# Patient Record
Sex: Male | Born: 1935 | Race: White | Hispanic: No | Marital: Married | Smoking: Never smoker
Health system: Southern US, Community
[De-identification: ages and names within clinical notes are randomized; demographics above are authoritative.]

## PROBLEM LIST (undated history)

## (undated) DIAGNOSIS — Z923 Personal history of irradiation: Secondary | ICD-10-CM

## (undated) DIAGNOSIS — N183 Chronic kidney disease, stage 3 unspecified: Secondary | ICD-10-CM

## (undated) DIAGNOSIS — Z8619 Personal history of other infectious and parasitic diseases: Secondary | ICD-10-CM

## (undated) DIAGNOSIS — Z9221 Personal history of antineoplastic chemotherapy: Secondary | ICD-10-CM

## (undated) DIAGNOSIS — H9190 Unspecified hearing loss, unspecified ear: Secondary | ICD-10-CM

## (undated) DIAGNOSIS — Z8669 Personal history of other diseases of the nervous system and sense organs: Secondary | ICD-10-CM

## (undated) DIAGNOSIS — C662 Malignant neoplasm of left ureter: Secondary | ICD-10-CM

## (undated) DIAGNOSIS — C679 Malignant neoplasm of bladder, unspecified: Secondary | ICD-10-CM

## (undated) DIAGNOSIS — G4700 Insomnia, unspecified: Secondary | ICD-10-CM

## (undated) DIAGNOSIS — H409 Unspecified glaucoma: Secondary | ICD-10-CM

## (undated) DIAGNOSIS — Z860101 Personal history of adenomatous and serrated colon polyps: Secondary | ICD-10-CM

## (undated) DIAGNOSIS — C189 Malignant neoplasm of colon, unspecified: Secondary | ICD-10-CM

## (undated) DIAGNOSIS — Z85038 Personal history of other malignant neoplasm of large intestine: Secondary | ICD-10-CM

## (undated) DIAGNOSIS — Z86010 Personal history of colonic polyps: Secondary | ICD-10-CM

## (undated) DIAGNOSIS — C801 Malignant (primary) neoplasm, unspecified: Secondary | ICD-10-CM

## (undated) DIAGNOSIS — F32A Depression, unspecified: Secondary | ICD-10-CM

## (undated) DIAGNOSIS — H905 Unspecified sensorineural hearing loss: Secondary | ICD-10-CM

## (undated) DIAGNOSIS — R310 Gross hematuria: Secondary | ICD-10-CM

## (undated) DIAGNOSIS — N400 Enlarged prostate without lower urinary tract symptoms: Secondary | ICD-10-CM

## (undated) DIAGNOSIS — M51369 Other intervertebral disc degeneration, lumbar region without mention of lumbar back pain or lower extremity pain: Secondary | ICD-10-CM

## (undated) DIAGNOSIS — Q600 Renal agenesis, unilateral: Secondary | ICD-10-CM

## (undated) DIAGNOSIS — E785 Hyperlipidemia, unspecified: Secondary | ICD-10-CM

## (undated) DIAGNOSIS — F329 Major depressive disorder, single episode, unspecified: Secondary | ICD-10-CM

## (undated) DIAGNOSIS — M5136 Other intervertebral disc degeneration, lumbar region: Secondary | ICD-10-CM

## (undated) DIAGNOSIS — IMO0002 Reserved for concepts with insufficient information to code with codable children: Secondary | ICD-10-CM

## (undated) DIAGNOSIS — C8330 Diffuse large B-cell lymphoma, unspecified site: Secondary | ICD-10-CM

## (undated) HISTORY — PX: COLONOSCOPY: SHX174

## (undated) HISTORY — PX: SIGMOIDOSCOPY: SUR1295

## (undated) HISTORY — PX: APPENDECTOMY: SHX54

---
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
## 1991-02-26 HISTORY — PX: HEMICOLECTOMY: SHX854

## 1998-11-15 ENCOUNTER — Ambulatory Visit (HOSPITAL_COMMUNITY): Admission: RE | Admit: 1998-11-15 | Discharge: 1998-11-15 | Payer: Self-pay | Admitting: Gastroenterology

## 2001-02-25 HISTORY — PX: INGUINAL HERNIA REPAIR: SUR1180

## 2001-11-17 ENCOUNTER — Ambulatory Visit (HOSPITAL_COMMUNITY): Admission: RE | Admit: 2001-11-17 | Discharge: 2001-11-17 | Payer: Self-pay | Admitting: Gastroenterology

## 2005-04-25 ENCOUNTER — Encounter: Admission: RE | Admit: 2005-04-25 | Discharge: 2005-04-25 | Payer: Self-pay | Admitting: Orthopedic Surgery

## 2013-01-01 ENCOUNTER — Emergency Department (HOSPITAL_COMMUNITY)
Admission: EM | Admit: 2013-01-01 | Discharge: 2013-01-01 | Disposition: A | Discharge status: Home / Self Care | Payer: Medicare Other | Source: Home / Self Care | Attending: Family Medicine | Admitting: Family Medicine

## 2013-01-01 ENCOUNTER — Encounter (HOSPITAL_COMMUNITY): Payer: Self-pay | Admitting: Emergency Medicine

## 2013-01-01 DIAGNOSIS — B029 Zoster without complications: Secondary | ICD-10-CM

## 2013-01-01 HISTORY — DX: Hyperlipidemia, unspecified: E78.5

## 2013-01-01 HISTORY — DX: Benign prostatic hyperplasia without lower urinary tract symptoms: N40.0

## 2013-01-01 HISTORY — DX: Insomnia, unspecified: G47.00

## 2013-01-01 MED ORDER — TRAMADOL HCL 50 MG PO TABS: 50.0000 mg | ORAL_TABLET | Freq: Four times a day (QID) | ORAL | Status: DC | PRN

## 2013-01-01 MED ORDER — VALACYCLOVIR HCL 1 G PO TABS: 1000.0000 mg | ORAL_TABLET | Freq: Three times a day (TID) | ORAL | Status: AC

## 2013-01-01 NOTE — ED Provider Notes (Signed)
Jonathan Marshall is a 79 y.o. male who presents to Urgent Care today for right sided rash. 3 days ago patient noted pain in his right lower back after playing golf. 2 days after his pain started he noted a rash on the right low back. His wife who is a nurse suspect shingles. The rash is been present for one day. He denies any other rashes fevers chills nausea vomiting or diarrhea. He denies any radiating pain weakness or numbness. He has not tried any medications yet.    Past Medical History  Diagnosis Date  . Insomnia   . BPH (benign prostatic hyperplasia)   . Hyperlipidemia    History  Substance Use Topics  . Smoking status: Never Smoker   . Smokeless tobacco: Not on file  . Alcohol Use: No   ROS as above Medications reviewed. No current facility-administered medications for this encounter.   Current Outpatient Prescriptions  Medication Sig Dispense Refill  . ALPRAZolam (XANAX) 0.5 MG tablet Take 0.5 mg by mouth at bedtime as needed for anxiety.      [name] Kitchen LOVASTATIN PO Take by mouth.      . tamsulosin (FLOMAX) 0.4 MG CAPS capsule Take 0.4 mg by mouth.      . traMADol (ULTRAM) 50 MG tablet Take 1 tablet (50 mg total) by mouth every 6 (six) hours as needed.  40 tablet  0  . valACYclovir (VALTREX) 1000 MG tablet Take 1 tablet (1,000 mg total) by mouth 3 (three) times daily.  21 tablet  0    Exam:  BP 146/93  Pulse 70  Temp(Src) 98.2 F (36.8 C) (Oral)  Resp 16  SpO2 94% Gen: Well NAD HEENT: EOMI,  MMM Lungs: CTABL Nl WOB Heart: RRR no MRG Abd: NABS, NT, ND Exts: Non edematous BL  LE, warm and well perfused.  Skin: Macular papular rash with occasional vesicles in the right low back. Extends to the mid flank. It does not cross the midline.  No results found for this or any previous visit (from the past 24 hour(s)). No results found.  Assessment and Plan: 79 y.o. male with shingles.  Plan to treat with Valtrex, and tramadol. Will avoid amitriptyline due to patient's BPH  history. Additionally we'll avoid prednisone as this has not been very beneficial and studies and patient is geriatric. Followup with primary care provider.  Discussed warning signs or symptoms. Please see discharge instructions. Patient expresses understanding.      Rodolph Bong, MD 01/01/13 1229

## 2013-01-01 NOTE — ED Notes (Signed)
Pt c/o rash, poss shingles, onset yest on lower back Feels like a "burning sensation" and it's painful Denies: f/v/n/d Alert w/no signs of acute distress.

## 2013-02-25 HISTORY — PX: MASTOIDECTOMY: SUR855

## 2013-02-27 ENCOUNTER — Emergency Department (HOSPITAL_COMMUNITY)
Admission: EM | Admit: 2013-02-27 | Discharge: 2013-02-27 | Disposition: A | Discharge status: Home / Self Care | Payer: Medicare Other | Source: Home / Self Care

## 2013-02-27 ENCOUNTER — Encounter (HOSPITAL_COMMUNITY): Payer: Self-pay | Admitting: Emergency Medicine

## 2013-02-27 DIAGNOSIS — J069 Acute upper respiratory infection, unspecified: Secondary | ICD-10-CM

## 2013-02-27 DIAGNOSIS — R059 Cough, unspecified: Secondary | ICD-10-CM

## 2013-02-27 DIAGNOSIS — R0982 Postnasal drip: Secondary | ICD-10-CM

## 2013-02-27 DIAGNOSIS — R05 Cough: Secondary | ICD-10-CM

## 2013-02-27 MED ORDER — GUAIFENESIN-CODEINE 100-10 MG/5ML PO SYRP: 5.0000 mL | ORAL_SOLUTION | ORAL | Status: DC | PRN

## 2013-02-27 MED ORDER — CHLORPHENIRAMINE MALEATE 4 MG PO TABS
ORAL_TABLET | ORAL | Status: DC
Start: 2013-02-27 — End: 2016-08-22

## 2013-02-27 NOTE — Discharge Instructions (Signed)
Cough, Adult  A cough is a reflex that helps clear your throat and airways. It can help heal the body or may be a reaction to an irritated airway. A cough may only last 2 or 3 weeks (acute) or may last more than 8 weeks (chronic).  CAUSES Acute cough:  Viral or bacterial infections. Chronic cough:  Infections.  Allergies.  Asthma.  Post-nasal drip.  Smoking.  Heartburn or acid reflux.  Some medicines.  Chronic lung problems (COPD).  Cancer. SYMPTOMS   Cough.  Fever.  Chest pain.  Increased breathing rate.  High-pitched whistling sound when breathing (wheezing).  Colored mucus that you cough up (sputum). TREATMENT   A bacterial cough may be treated with antibiotic medicine.  A viral cough must run its course and will not respond to antibiotics.  Your caregiver may recommend other treatments if you have a chronic cough. HOME CARE INSTRUCTIONS   Only take over-the-counter or prescription medicines for pain, discomfort, or fever as directed by your caregiver. Use cough suppressants only as directed by your caregiver.  Use a cold steam vaporizer or humidifier in your bedroom or home to help loosen secretions.  Sleep in a semi-upright position if your cough is worse at night.  Rest as needed.  Stop smoking if you smoke. SEEK IMMEDIATE MEDICAL CARE IF:   You have pus in your sputum.  Your cough starts to worsen.  You cannot control your cough with suppressants and are losing sleep.  You begin coughing up blood.  You have difficulty breathing.  You develop pain which is getting worse or is uncontrolled with medicine.  You have a fever. MAKE SURE YOU:   Understand these instructions.  Will watch your condition.  Will get help right away if you are not doing well or get worse. Document Released: 08/10/2010 Document Revised: 05/06/2011 Document Reviewed: 08/10/2010 [facility] Patient Information 2014 [place].  Upper Respiratory Infection,  Adult An upper respiratory infection (URI) is also known as the common cold. It is often caused by a type of germ (virus). Colds are easily spread (contagious). You can pass it to others by kissing, coughing, sneezing, or drinking out of the same glass. Usually, you get better in 1 or 2 weeks.  HOME CARE   Only take medicine as told by your doctor.  Use a warm mist humidifier or breathe in steam from a hot shower.  Drink enough water and fluids to keep your pee (urine) clear or pale yellow.  Get plenty of rest.  Return to work when your temperature is back to normal or as told by your doctor. You may use a face mask and wash your hands to stop your cold from spreading. GET HELP RIGHT AWAY IF:   After the first few days, you feel you are getting worse.  You have questions about your medicine.  You have chills, shortness of breath, or brown or red spit (mucus).  You have yellow or brown snot (nasal discharge) or pain in the face, especially when you bend forward.  You have a fever, puffy (swollen) neck, pain when you swallow, or white spots in the back of your throat.  You have a bad headache, ear pain, sinus pain, or chest pain.  You have a high-pitched whistling sound when you breathe in and out (wheezing).  You have a lasting cough or cough up blood.  You have sore muscles or a stiff neck. MAKE SURE YOU:   Understand these instructions.  Will watch your  condition.  Will get help right away if you are not doing well or get worse. Document Released: 07/31/2007 Document Revised: 05/06/2011 Document Reviewed: 06/18/2010 [facility] Patient Information 2014 [place], [place].

## 2013-02-27 NOTE — ED Notes (Signed)
C/o cold sx for 8 days States he has a dry cough Not able to sleep b/c of the dry cough Admits to runny nose Denies fever, vomiting, diarrhea States 8 weeks ago did have shingles.

## 2013-02-27 NOTE — ED Provider Notes (Signed)
CSN: [id number]     Arrival date & time 02/27/13  4235 History   First MD Initiated Contact with Patient 02/27/13 [phone]     Chief Complaint  Patient presents with  . Cough  . URI   (Consider location/radiation/quality/duration/timing/severity/associated sxs/prior Treatment) HPI Comments: Patient having PND and cough for 5 days.   Past Medical History  Diagnosis Date  . Insomnia   . BPH (benign prostatic hyperplasia)   . Hyperlipidemia    History reviewed. No pertinent past surgical history. History reviewed. No pertinent family history. History  Substance Use Topics  . Smoking status: Never Smoker   . Smokeless tobacco: Not on file  . Alcohol Use: No    Review of Systems  Constitutional: Negative for fever, diaphoresis, activity change and fatigue.  HENT: Positive for postnasal drip. Negative for ear pain, facial swelling, rhinorrhea, sore throat and trouble swallowing.   Eyes: Negative for pain, discharge and redness.  Respiratory: Positive for cough. Negative for chest tightness and shortness of breath.   Cardiovascular: Negative.   Gastrointestinal: Negative.   Musculoskeletal: Negative.  Negative for neck pain and neck stiffness.  Neurological: Negative.     Allergies  Review of patient's allergies indicates no known allergies.  Home Medications   Current Outpatient Rx  Name  Route  Sig  Dispense  Refill  . ALPRAZolam (XANAX) 0.5 MG tablet   Oral   Take 0.5 mg by mouth at bedtime as needed for anxiety.         . chlorpheniramine (CHLOR-TRIMETON) 4 MG tablet      1/2 tablet q 4-6h prn drainage   14 tablet   0   . guaiFENesin-codeine (CHERATUSSIN AC) 100-10 MG/5ML syrup   Oral   Take 5 mLs by mouth every 4 (four) hours as needed for cough or congestion.   120 mL   0   . LOVASTATIN PO   Oral   Take by mouth.         . tamsulosin (FLOMAX) 0.4 MG CAPS capsule   Oral   Take 0.4 mg by mouth.         . traMADol (ULTRAM) 50 MG tablet   Oral   Take  1 tablet (50 mg total) by mouth every 6 (six) hours as needed.   40 tablet   0    BP 144/83  Pulse 95  Temp(Src) 98.6 F (37 C) (Oral)  Resp 18  SpO2 97% Physical Exam  Nursing note and vitals reviewed. Constitutional: He is oriented to person, place, and time. He appears well-developed and well-nourished. No distress.  HENT:  Right Ear: External ear normal.  Left Ear: External ear normal.  Mouth/Throat: No oropharyngeal exudate.  OP with minor erythema and clear PND. No exudates.  Eyes: Conjunctivae and EOM are normal.  Neck: Normal range of motion. Neck supple.  Cardiovascular: Normal rate, regular rhythm and normal heart sounds.   Pulmonary/Chest: Effort normal and breath sounds normal. No respiratory distress. He has no wheezes. He has no rales.  Musculoskeletal: Normal range of motion. He exhibits no edema.  Lymphadenopathy:    He has no cervical adenopathy.  Neurological: He is alert and oriented to person, place, and time.  Skin: Skin is warm and dry. No rash noted.  Psychiatric: He has a normal mood and affect.    ED Course  Procedures (including critical care time) Labs Review Labs Reviewed - No data to display Imaging Review No results found.    MDM  1. Cough   2. PND (post-nasal drip)   3. URI (upper respiratory infection)    Chlorpheniramine 2 mg daily 4-6 hours when necessary drainage Cheratussin 1-2 teaspoons Q4H when necessary cough Plenty of fluids Follow with your PCP     Janne Napoleon, NP 02/27/13 [phone]

## 2013-03-02 NOTE — ED Provider Notes (Signed)
Medical screening examination/treatment/procedure(s) were performed by resident physician or non-physician practitioner and as supervising physician I was immediately available for consultation/collaboration.   Pauline Good MD.   Billy Fischer, MD 03/02/13 [phone]

## 2013-08-10 ENCOUNTER — Encounter (HOSPITAL_BASED_OUTPATIENT_CLINIC_OR_DEPARTMENT_OTHER): Payer: Self-pay | Admitting: *Deleted

## 2013-08-10 NOTE — Progress Notes (Signed)
No labs needed

## 2013-08-12 ENCOUNTER — Other Ambulatory Visit: Payer: Self-pay | Admitting: Physician Assistant

## 2013-08-12 NOTE — H&P (Signed)
Jonathan Marshall is an 80 y.o. male.   Chief Complaint: left carpal tunnel HPI: Jonathan Marshall is a long time patient of mine with left hand pain and numbness.  Treated by a chiropractor with sporadic improvement.  On the other hand, he has symptoms that seem more likely to be carpal tunnel rather than cervical.  The numbness which was mildly uncomfortable has now become significant a pain particularly in the index finger.  NCV study confirm severe carpal tunnel left hand.  Past Medical History  Diagnosis Date  . Insomnia   . BPH (benign prostatic hyperplasia)   . Hyperlipidemia   . History of shingles   . Cancer     colon    Past Surgical History  Procedure Laterality Date  . Colon resection  1993    colon cancer  . Colonoscopy      several  . Inguinal hernia repair  2003    right    No family history on file. Social History:  reports that he has never smoked. He does not have any smokeless tobacco history on file. He reports that he does not drink alcohol or use illicit drugs.  Allergies: No Known Allergies   (Not in a hospital admission)  No results found for this or any previous visit (from the past 48 hour(s)). No results found.  Review of Systems  Constitutional: Negative.   HENT: Negative.   Eyes: Negative.   Respiratory: Negative.   Cardiovascular: Negative.   Gastrointestinal: Negative.   Genitourinary: Negative.   Musculoskeletal: Negative.   Skin: Negative.   Neurological: Positive for tingling and sensory change. Negative for dizziness, tremors, speech change, focal weakness, seizures and loss of consciousness.  Endo/Heme/Allergies: Negative.   Psychiatric/Behavioral: Negative.     There were no vitals taken for this visit. Physical Exam  Constitutional: He is oriented to person, place, and time. He appears well-developed and well-nourished. No distress.  HENT:  Head: Normocephalic and atraumatic.  Nose: Nose normal.  Eyes: EOM are normal. Pupils are  equal, round, and reactive to light.  Neck: Normal range of motion. Neck supple.  Cardiovascular: Intact distal pulses.   Respiratory: Effort normal. No respiratory distress. He has no wheezes.  GI: Soft. He exhibits no distension. There is no tenderness.  Musculoskeletal:  Positive tinel and phalens left hand  Neurological: He is alert and oriented to person, place, and time. No cranial nerve deficit.  Skin: Skin is warm and dry. No rash noted. No erythema.  Psychiatric: He has a normal mood and affect. His behavior is normal.     Assessment/Plan Left carpal tunnel syndrome  Discussed risks and benefits of left carpal tunnel release patient wishes to proceed.  This will be done outpatient as soon as possible.  Chriss Czar 08/12/2013, 2:13 PM

## 2013-08-13 ENCOUNTER — Encounter (HOSPITAL_BASED_OUTPATIENT_CLINIC_OR_DEPARTMENT_OTHER)
Admission: RE | Disposition: A | Discharge status: Home / Self Care | Payer: Self-pay | Source: Ambulatory Visit | Attending: Orthopedic Surgery

## 2013-08-13 ENCOUNTER — Ambulatory Visit (HOSPITAL_BASED_OUTPATIENT_CLINIC_OR_DEPARTMENT_OTHER)
Admission: RE | Admit: 2013-08-13 | Discharge: 2013-08-13 | Disposition: A | Discharge status: Home / Self Care | Payer: Medicare Other | Source: Ambulatory Visit | Attending: Orthopedic Surgery | Admitting: Orthopedic Surgery

## 2013-08-13 ENCOUNTER — Encounter (HOSPITAL_BASED_OUTPATIENT_CLINIC_OR_DEPARTMENT_OTHER): Payer: Self-pay | Admitting: *Deleted

## 2013-08-13 ENCOUNTER — Ambulatory Visit (HOSPITAL_BASED_OUTPATIENT_CLINIC_OR_DEPARTMENT_OTHER): Payer: Medicare Other | Admitting: Certified Registered"

## 2013-08-13 ENCOUNTER — Encounter (HOSPITAL_BASED_OUTPATIENT_CLINIC_OR_DEPARTMENT_OTHER): Payer: Medicare Other | Admitting: Certified Registered"

## 2013-08-13 DIAGNOSIS — E785 Hyperlipidemia, unspecified: Secondary | ICD-10-CM | POA: Insufficient documentation

## 2013-08-13 DIAGNOSIS — Z85038 Personal history of other malignant neoplasm of large intestine: Secondary | ICD-10-CM | POA: Insufficient documentation

## 2013-08-13 DIAGNOSIS — N400 Enlarged prostate without lower urinary tract symptoms: Secondary | ICD-10-CM | POA: Insufficient documentation

## 2013-08-13 DIAGNOSIS — G5600 Carpal tunnel syndrome, unspecified upper limb: Secondary | ICD-10-CM | POA: Insufficient documentation

## 2013-08-13 HISTORY — PX: CARPAL TUNNEL RELEASE: SHX101

## 2013-08-13 HISTORY — DX: Personal history of other infectious and parasitic diseases: Z86.19

## 2013-08-13 HISTORY — DX: Malignant (primary) neoplasm, unspecified: C80.1

## 2013-08-13 LAB — POCT HEMOGLOBIN-HEMACUE: HEMOGLOBIN: 17.7 g/dL — AB (ref 13.0–17.0)

## 2013-08-13 SURGERY — CARPAL TUNNEL RELEASE
Anesthesia: General | Site: Wrist | Laterality: Left

## 2013-08-13 MED ORDER — FENTANYL CITRATE 0.05 MG/ML IJ SOLN: INTRAMUSCULAR | Status: DC | PRN

## 2013-08-13 MED ORDER — LIDOCAINE HCL (CARDIAC) 20 MG/ML IV SOLN
INTRAVENOUS | Status: DC | PRN
  Administered 2013-08-13: 100 mg via INTRAVENOUS

## 2013-08-13 MED ORDER — FENTANYL CITRATE 0.05 MG/ML IJ SOLN
INTRAMUSCULAR | Status: DC | PRN
  Administered 2013-08-13: 100 ug via INTRAVENOUS

## 2013-08-13 MED ORDER — CEFAZOLIN SODIUM-DEXTROSE 2-3 GM-% IV SOLR
INTRAVENOUS | Status: AC
  Filled 2013-08-13: qty 50

## 2013-08-13 MED ORDER — EPHEDRINE SULFATE 50 MG/ML IJ SOLN
INTRAMUSCULAR | Status: DC | PRN
  Administered 2013-08-13: 5 mg via INTRAVENOUS
  Administered 2013-08-13 (×2): 10 mg via INTRAVENOUS

## 2013-08-13 MED ORDER — SODIUM CHLORIDE 0.9 % IV SOLN: INTRAVENOUS | Status: DC

## 2013-08-13 MED ORDER — FENTANYL CITRATE 0.05 MG/ML IJ SOLN: 50.0000 ug | INTRAMUSCULAR | Status: DC | PRN

## 2013-08-13 MED ORDER — BUPIVACAINE HCL (PF) 0.5 % IJ SOLN
INTRAMUSCULAR | Status: AC
  Filled 2013-08-13: qty 30

## 2013-08-13 MED ORDER — CHLORHEXIDINE GLUCONATE 4 % EX LIQD: 60.0000 mL | Freq: Once | CUTANEOUS | Status: DC

## 2013-08-13 MED ORDER — FENTANYL CITRATE 0.05 MG/ML IJ SOLN
INTRAMUSCULAR | Status: AC
  Filled 2013-08-13: qty 6

## 2013-08-13 MED ORDER — LACTATED RINGERS IV SOLN
INTRAVENOUS | Status: DC
Start: 2013-08-13 — End: 2013-08-13
  Administered 2013-08-13: 10:00:00 via INTRAVENOUS

## 2013-08-13 MED ORDER — LIDOCAINE HCL (PF) 1 % IJ SOLN
INTRAMUSCULAR | Status: AC
  Filled 2013-08-13: qty 30

## 2013-08-13 MED ORDER — OXYCODONE HCL 5 MG PO TABS: 5.0000 mg | ORAL_TABLET | Freq: Once | ORAL | Status: DC | PRN

## 2013-08-13 MED ORDER — HYDROMORPHONE HCL PF 1 MG/ML IJ SOLN: 0.2500 mg | INTRAMUSCULAR | Status: DC | PRN

## 2013-08-13 MED ORDER — PROPOFOL 10 MG/ML IV BOLUS
INTRAVENOUS | Status: DC | PRN
  Administered 2013-08-13: 150 mg via INTRAVENOUS

## 2013-08-13 MED ORDER — GLYCOPYRROLATE 0.2 MG/ML IJ SOLN
INTRAMUSCULAR | Status: DC | PRN
  Administered 2013-08-13: 0.2 mg via INTRAVENOUS

## 2013-08-13 MED ORDER — CEFAZOLIN SODIUM-DEXTROSE 2-3 GM-% IV SOLR
2.0000 g | INTRAVENOUS | Status: AC
  Administered 2013-08-13: 2 g via INTRAVENOUS

## 2013-08-13 MED ORDER — MIDAZOLAM HCL 2 MG/2ML IJ SOLN: 1.0000 mg | INTRAMUSCULAR | Status: DC | PRN

## 2013-08-13 MED ORDER — ONDANSETRON HCL 4 MG/2ML IJ SOLN: 4.0000 mg | Freq: Once | INTRAMUSCULAR | Status: DC | PRN

## 2013-08-13 MED ORDER — OXYCODONE HCL 5 MG/5ML PO SOLN: 5.0000 mg | Freq: Once | ORAL | Status: DC | PRN

## 2013-08-13 MED ORDER — BUPIVACAINE HCL (PF) 0.5 % IJ SOLN
INTRAMUSCULAR | Status: DC | PRN
  Administered 2013-08-13: 10 mL

## 2013-08-13 SURGICAL SUPPLY — 42 items
BANDAGE ELASTIC 3 VELCRO ST LF (GAUZE/BANDAGES/DRESSINGS) ×2 IMPLANT
BLADE MINI RND TIP GREEN BEAV (BLADE) IMPLANT
BLADE SURG 15 STRL LF DISP TIS (BLADE) ×1 IMPLANT
BLADE SURG 15 STRL SS (BLADE) ×1
BNDG CONFORM 3 STRL LF (GAUZE/BANDAGES/DRESSINGS) ×2 IMPLANT
BNDG ESMARK 4X9 LF (GAUZE/BANDAGES/DRESSINGS) ×2 IMPLANT
CORDS BIPOLAR (ELECTRODE) ×2 IMPLANT
COVER MAYO STAND STRL (DRAPES) ×2 IMPLANT
COVER TABLE BACK 60X90 (DRAPES) ×2 IMPLANT
DRAPE EXTREMITY T 121X128X90 (DRAPE) ×2 IMPLANT
DRSG EMULSION OIL 3X3 NADH (GAUZE/BANDAGES/DRESSINGS) ×2 IMPLANT
DRSG PAD ABDOMINAL 8X10 ST (GAUZE/BANDAGES/DRESSINGS) ×2 IMPLANT
DURAPREP 26ML APPLICATOR (WOUND CARE) ×2 IMPLANT
GAUZE SPONGE 4X4 12PLY STRL (GAUZE/BANDAGES/DRESSINGS) ×2 IMPLANT
GLOVE BIO SURGEON STRL SZ 6.5 (GLOVE) ×6 IMPLANT
GLOVE BIO SURGEON STRL SZ7.5 (GLOVE) ×2 IMPLANT
GLOVE BIOGEL PI IND STRL 7.0 (GLOVE) ×3 IMPLANT
GLOVE BIOGEL PI IND STRL 8 (GLOVE) ×3 IMPLANT
GLOVE BIOGEL PI INDICATOR 7.0 (GLOVE) ×3
GLOVE BIOGEL PI INDICATOR 8 (GLOVE) ×3
GLOVE SURG ORTHO 8.0 STRL STRW (GLOVE) ×2 IMPLANT
GOWN STRL REUS W/ TWL LRG LVL3 (GOWN DISPOSABLE) ×3 IMPLANT
GOWN STRL REUS W/ TWL XL LVL3 (GOWN DISPOSABLE) ×1 IMPLANT
GOWN STRL REUS W/TWL LRG LVL3 (GOWN DISPOSABLE) ×3
GOWN STRL REUS W/TWL XL LVL3 (GOWN DISPOSABLE) ×1
NEEDLE HYPO 25X1 1.5 SAFETY (NEEDLE) ×2 IMPLANT
NS IRRIG 1000ML POUR BTL (IV SOLUTION) ×2 IMPLANT
PACK BASIN DAY SURGERY FS (CUSTOM PROCEDURE TRAY) ×2 IMPLANT
PADDING CAST ABS 3INX4YD NS (CAST SUPPLIES) ×1
PADDING CAST ABS 4INX4YD NS (CAST SUPPLIES)
PADDING CAST ABS COTTON 3X4 (CAST SUPPLIES) ×1 IMPLANT
PADDING CAST ABS COTTON 4X4 ST (CAST SUPPLIES) IMPLANT
SPLINT PLASTER CAST XFAST 3X15 (CAST SUPPLIES) ×1 IMPLANT
SPLINT PLASTER XTRA FASTSET 3X (CAST SUPPLIES) ×1
STOCKINETTE 4X48 STRL (DRAPES) ×2 IMPLANT
SUT ETHILON 4 0 PS 2 18 (SUTURE) ×2 IMPLANT
SUT ETHILON 5 0 P 3 18 (SUTURE)
SUT NYLON ETHILON 5-0 P-3 1X18 (SUTURE) IMPLANT
SYR BULB 3OZ (MISCELLANEOUS) ×2 IMPLANT
SYR CONTROL 10ML LL (SYRINGE) ×2 IMPLANT
TOWEL OR 17X24 6PK STRL BLUE (TOWEL DISPOSABLE) ×2 IMPLANT
UNDERPAD 30X30 INCONTINENT (UNDERPADS AND DIAPERS) ×2 IMPLANT

## 2013-08-13 NOTE — Anesthesia Preprocedure Evaluation (Signed)
Anesthesia Evaluation  Patient identified by MRN, date of birth, ID band Patient awake    Reviewed: Allergy & Precautions, H&P , NPO status , Patient's Chart, lab work & pertinent test results  Airway Mallampati: I TM Distance: >3 FB Neck ROM: Full    Dental  (+) Teeth Intact, Dental Advisory Given   Pulmonary  breath sounds clear to auscultation        Cardiovascular Rhythm:Regular Rate:Normal     Neuro/Psych    GI/Hepatic   Endo/Other    Renal/GU      Musculoskeletal   Abdominal   Peds  Hematology   Anesthesia Other Findings   Reproductive/Obstetrics                           Anesthesia Physical Anesthesia Plan  ASA: I  Anesthesia Plan: General   Post-op Pain Management:    Induction: Intravenous  Airway Management Planned: LMA  Additional Equipment:   Intra-op Plan:   Post-operative Plan: Extubation in OR  Informed Consent: I have reviewed the patients History and Physical, chart, labs and discussed the procedure including the risks, benefits and alternatives for the proposed anesthesia with the patient or authorized representative who has indicated his/her understanding and acceptance.   Dental advisory given  Plan Discussed with: CRNA, Anesthesiologist and Surgeon  Anesthesia Plan Comments:         Anesthesia Quick Evaluation

## 2013-08-13 NOTE — Interval H&P Note (Signed)
History and Physical Interval Note:  08/13/2013 7:28 AM  Jonathan Marshall  has presented today for surgery, with the diagnosis of left wrist: Carpal Tunnel Syndrome  The various methods of treatment have been discussed with the patient and family. After consideration of risks, benefits and other options for treatment, the patient has consented to  Procedure(s): CARPAL TUNNEL RELEASE LEFT WRIST (Left) as a surgical intervention .  The patient's history has been reviewed, patient examined, no change in status, stable for surgery.  I have reviewed the patient's chart and labs.  Questions were answered to the patient's satisfaction.     CAFFREY JR,W D

## 2013-08-13 NOTE — Discharge Instructions (Signed)
°  Post Anesthesia Home Care Instructions  Activity: Get plenty of rest for the remainder of the day. A responsible adult should stay with you for 24 hours following the procedure.  For the next 24 hours, DO NOT: -Drive a car -Paediatric nurse -Drink alcoholic beverages -Take any medication unless instructed by your physician -Make any legal decisions or sign important papers.  Meals: Start with liquid foods such as gelatin or soup. Progress to regular foods as tolerated. Avoid greasy, spicy, heavy foods. If nausea and/or vomiting occur, drink only clear liquids until the nausea and/or vomiting subsides. Call your physician if vomiting continues.  Special Instructions/Symptoms: Your throat may feel dry or sore from the anesthesia or the breathing tube placed in your throat during surgery. If this causes discomfort, gargle with warm salt water. The discomfort should disappear within 24 hours.  Diet: As you were doing prior to hospitalization   Activity: Increase activity slowly as tolerated.  Work on finger and hand range of motion as tolerated.    Shower: May shower without a dressing on post op day #3, NO SOAKING in tub   Dressing: You may change your dressing on post op day #3.  Then change the dressing daily with sterile 4"x4"s gauze dressing   Weight Bearing: no lifting with operative hand.    To prevent constipation: you may use a stool softener such as -  Colace ( over the counter) 100 mg by mouth twice a day  Drink plenty of fluids ( prune juice may be helpful) and high fiber foods  Miralax ( over the counter) for constipation as needed.   Precautions: If you experience chest pain or shortness of breath - call 911 immediately For transfer to the hospital emergency department!!  If you develop a fever greater that 101 F, purulent drainage from wound, increased redness or drainage from wound, or calf pain -- Call the office   Follow- Up Appointment: Please call for an  appointment to be seen in 2 weeks  [place] - [phone]          HAND [place]    The following instructions have been prepared to help you care for yourself upon your return home today.  Wound Care:  Keep your hand elevated above the level of your heart. Do not allow it to dangle by your side. Keep the dressing dry and do not remove it unless your doctor advises you to do so. He will usually change it at the time of you post-op visit. Moving your fingers is advised to stimulate circulation but will depend on the site of your surgery. Of course, if you have a splint applied your doctor will advise you about movement.  Activity:  Do not drive or operate machinery today. Rest today and then you may return to your normal activity and work as indicated by your physician.  Diet: Drink liquids today or eat a light diet. You may resume a regular diet tomorrow.  General expectations: Pain for two or three days. Fingers may become slightly swollen.   Unexpected Observations- Call your doctor if any of these occur: Severe pain not relieved by pain medication. Elevated temperature. Dressing soaked with blood. Inability to move fingers. White or bluish color to fingers.

## 2013-08-13 NOTE — Brief Op Note (Signed)
08/13/2013  11:34 AM  PATIENT:  Jonathan Marshall  80 y.o. male  PRE-OPERATIVE DIAGNOSIS:  left wrist: Carpal Tunnel Syndrome  POST-OPERATIVE DIAGNOSIS:  left wrist: Carpal Tunnel Syndrome  PROCEDURE:  Procedure(s): CARPAL TUNNEL RELEASE LEFT WRIST (Left)  SURGEON:  Surgeon(s) and Role:    * W D Valeta Harms., MD - Primary  PHYSICIAN ASSISTANT: Chriss Czar, PA-C  ASSISTANTS:  ANESTHESIA:   local and general  EBL:  Total I/O In: 300 [I.V.:300] Out: -   BLOOD ADMINISTERED:none  DRAINS: none   LOCAL MEDICATIONS USED:  MARCAINE     SPECIMEN:  No Specimen  DISPOSITION OF SPECIMEN:  N/A  COUNTS:  YES  TOURNIQUET:   Total Tourniquet Time Documented: Upper Arm (Left) - 22 minutes Total: Upper Arm (Left) - 22 minutes   DICTATION: .Other Dictation: Dictation Number unknown  PLAN OF CARE: Discharge to home after PACU  PATIENT DISPOSITION:  PACU - hemodynamically stable.   Delay start of Pharmacological VTE agent (>24hrs) due to surgical blood loss or risk of bleeding: not applicable

## 2013-08-13 NOTE — H&P (View-Only) (Signed)
Jonathan Marshall is an 80 y.o. male.   Chief Complaint: left carpal tunnel HPI: Adom Schoeneck is a long time patient of mine with left hand pain and numbness.  Treated by a chiropractor with sporadic improvement.  On the other hand, he has symptoms that seem more likely to be carpal tunnel rather than cervical.  The numbness which was mildly uncomfortable has now become significant a pain particularly in the index finger.  NCV study confirm severe carpal tunnel left hand.  Past Medical History  Diagnosis Date  . Insomnia   . BPH (benign prostatic hyperplasia)   . Hyperlipidemia   . History of shingles   . Cancer     colon    Past Surgical History  Procedure Laterality Date  . Colon resection  1993    colon cancer  . Colonoscopy      several  . Inguinal hernia repair  2003    right    No family history on file. Social History:  reports that he has never smoked. He does not have any smokeless tobacco history on file. He reports that he does not drink alcohol or use illicit drugs.  Allergies: No Known Allergies   (Not in a hospital admission)  No results found for this or any previous visit (from the past 48 hour(s)). No results found.  Review of Systems  Constitutional: Negative.   HENT: Negative.   Eyes: Negative.   Respiratory: Negative.   Cardiovascular: Negative.   Gastrointestinal: Negative.   Genitourinary: Negative.   Musculoskeletal: Negative.   Skin: Negative.   Neurological: Positive for tingling and sensory change. Negative for dizziness, tremors, speech change, focal weakness, seizures and loss of consciousness.  Endo/Heme/Allergies: Negative.   Psychiatric/Behavioral: Negative.     There were no vitals taken for this visit. Physical Exam  Constitutional: He is oriented to person, place, and time. He appears well-developed and well-nourished. No distress.  HENT:  Head: Normocephalic and atraumatic.  Nose: Nose normal.  Eyes: EOM are normal. Pupils are  equal, round, and reactive to light.  Neck: Normal range of motion. Neck supple.  Cardiovascular: Intact distal pulses.   Respiratory: Effort normal. No respiratory distress. He has no wheezes.  GI: Soft. He exhibits no distension. There is no tenderness.  Musculoskeletal:  Positive tinel and phalens left hand  Neurological: He is alert and oriented to person, place, and time. No cranial nerve deficit.  Skin: Skin is warm and dry. No rash noted. No erythema.  Psychiatric: He has a normal mood and affect. His behavior is normal.     Assessment/Plan Left carpal tunnel syndrome  Discussed risks and benefits of left carpal tunnel release patient wishes to proceed.  This will be done outpatient as soon as possible.  Chriss Czar 08/12/2013, 2:13 PM

## 2013-08-13 NOTE — Anesthesia Postprocedure Evaluation (Signed)
Anesthesia Post Note  Patient: Jonathan Marshall  Procedure(s) Performed: Procedure(s) (LRB): CARPAL TUNNEL RELEASE LEFT WRIST (Left)  Anesthesia type: general  Patient location: PACU  Post pain: Pain level controlled  Post assessment: Patient's Cardiovascular Status Stable  Last Vitals:  Filed Vitals:   08/13/13 1225  BP: 134/66  Pulse: 67  Temp: 36.4 C  Resp: 16    Post vital signs: Reviewed and stable  Level of consciousness: sedated  Complications: No apparent anesthesia complications

## 2013-08-13 NOTE — Anesthesia Procedure Notes (Signed)
Procedure Name: LMA Insertion Date/Time: 08/13/2013 10:49 AM Performed by: Baxter Flattery Pre-anesthesia Checklist: Patient identified, Emergency Drugs available, Suction available and Patient being monitored Patient Re-evaluated:Patient Re-evaluated prior to inductionOxygen Delivery Method: Circle System Utilized Preoxygenation: Pre-oxygenation with 100% oxygen Intubation Type: IV induction Ventilation: Mask ventilation without difficulty LMA: LMA inserted LMA Size: 5.0 Number of attempts: 1 Airway Equipment and Method: bite block Placement Confirmation: positive ETCO2 and breath sounds checked- equal and bilateral Tube secured with: Tape Dental Injury: Teeth and Oropharynx as per pre-operative assessment

## 2013-08-13 NOTE — Transfer of Care (Signed)
Immediate Anesthesia Transfer of Care Note  Patient: Jonathan Marshall  Procedure(s) Performed: Procedure(s): CARPAL TUNNEL RELEASE LEFT WRIST (Left)  Patient Location: PACU  Anesthesia Type:General  Level of Consciousness: awake, sedated and patient cooperative  Airway & Oxygen Therapy: Patient Spontanous Breathing and Patient connected to face mask oxygen  Post-op Assessment: Report given to PACU RN and Post -op Vital signs reviewed and stable  Post vital signs: Reviewed and stable  Complications: No apparent anesthesia complications

## 2013-08-16 ENCOUNTER — Encounter (HOSPITAL_BASED_OUTPATIENT_CLINIC_OR_DEPARTMENT_OTHER): Payer: Self-pay | Admitting: Orthopedic Surgery

## 2013-08-16 NOTE — Op Note (Signed)
NAME:  LENNARD, CAPEK NO.:  [id number]  MEDICAL RECORD NO.:  [id number]  LOCATION:                               FACILITY:  [facility]  PHYSICIAN:  Lockie Pares, M.D.    DATE OF BIRTH:  [birth year]  DATE OF PROCEDURE:  08/13/2013 DATE OF DISCHARGE:  08/13/2013                              OPERATIVE REPORT   PREOPERATIVE DIAGNOSIS:  Carpal tunnel, left wrist.  POSTOPERATIVE DIAGNOSIS:  Carpal tunnel, left wrist.  OPERATION:  Left carpal tunnel release.  SURGEON:  Lockie Pares, M.D.  ASSISTANT:  Chriss Czar, PA-C.  ANESTHESIA:  General.  BLOOD LOSS:  Minimal.  TOURNIQUET TIME:  Approximately 25 minutes.  DESCRIPTION OF PROCEDURE:  After sterile prep and drape, exsanguination of the arm, placed into 250, curvilinear incision was made just ulnar to the thenar crease.  The palmar fascia was very thick.  Very thick palmaris brevis was encountered.  We dissected on the ulnar side of the transverse carpal ligament.  We dissected distally to the level of superficial arch and proximally to include a very thickened antebrachial fascia of the wrist.  Unroofing the nerve with moderate constriction noted the nerve.  No other anomalies noted.  Wound irrigated, closed it with interrupted nylon with Marcaine without epinephrine.  Lightly compressive sterile dressing applied.  Taken to the recovery room in stable condition.     Lockie Pares, M.D.     WDC/MEDQ  D:  08/13/2013  T:  08/13/2013  Job:  [id number]

## 2013-10-08 ENCOUNTER — Other Ambulatory Visit: Payer: Self-pay | Admitting: Otolaryngology

## 2013-10-08 DIAGNOSIS — H908 Mixed conductive and sensorineural hearing loss, unspecified: Secondary | ICD-10-CM

## 2013-10-19 ENCOUNTER — Ambulatory Visit
Admission: RE | Admit: 2013-10-19 | Discharge: 2013-10-19 | Disposition: A | Discharge status: Home / Self Care | Payer: Medicare Other | Source: Ambulatory Visit | Attending: Otolaryngology | Admitting: Otolaryngology

## 2013-10-19 DIAGNOSIS — H908 Mixed conductive and sensorineural hearing loss, unspecified: Secondary | ICD-10-CM

## 2013-10-19 MED ORDER — IOHEXOL 300 MG/ML  SOLN
75.0000 mL | Freq: Once | INTRAMUSCULAR | Status: AC | PRN
  Administered 2013-10-19: 75 mL via INTRAVENOUS

## 2013-11-09 ENCOUNTER — Ambulatory Visit
Admission: RE | Admit: 2013-11-09 | Discharge: 2013-11-09 | Disposition: A | Discharge status: Home / Self Care | Payer: Medicare Other | Source: Ambulatory Visit | Attending: Internal Medicine | Admitting: Internal Medicine

## 2013-11-09 ENCOUNTER — Other Ambulatory Visit: Payer: Self-pay | Admitting: Internal Medicine

## 2013-11-09 DIAGNOSIS — R059 Cough, unspecified: Secondary | ICD-10-CM

## 2013-11-09 DIAGNOSIS — R05 Cough: Secondary | ICD-10-CM

## 2013-12-20 ENCOUNTER — Other Ambulatory Visit: Payer: Self-pay | Admitting: Otolaryngology

## 2016-08-14 IMAGING — CR DG CHEST 2V
2 series · 2 of 2 positions shown · non-contrast
Comparison: None.

CLINICAL DATA: Prolonged cough, congestion

EXAM:
CHEST  2 VIEW

[view not recorded (1 of 2)]
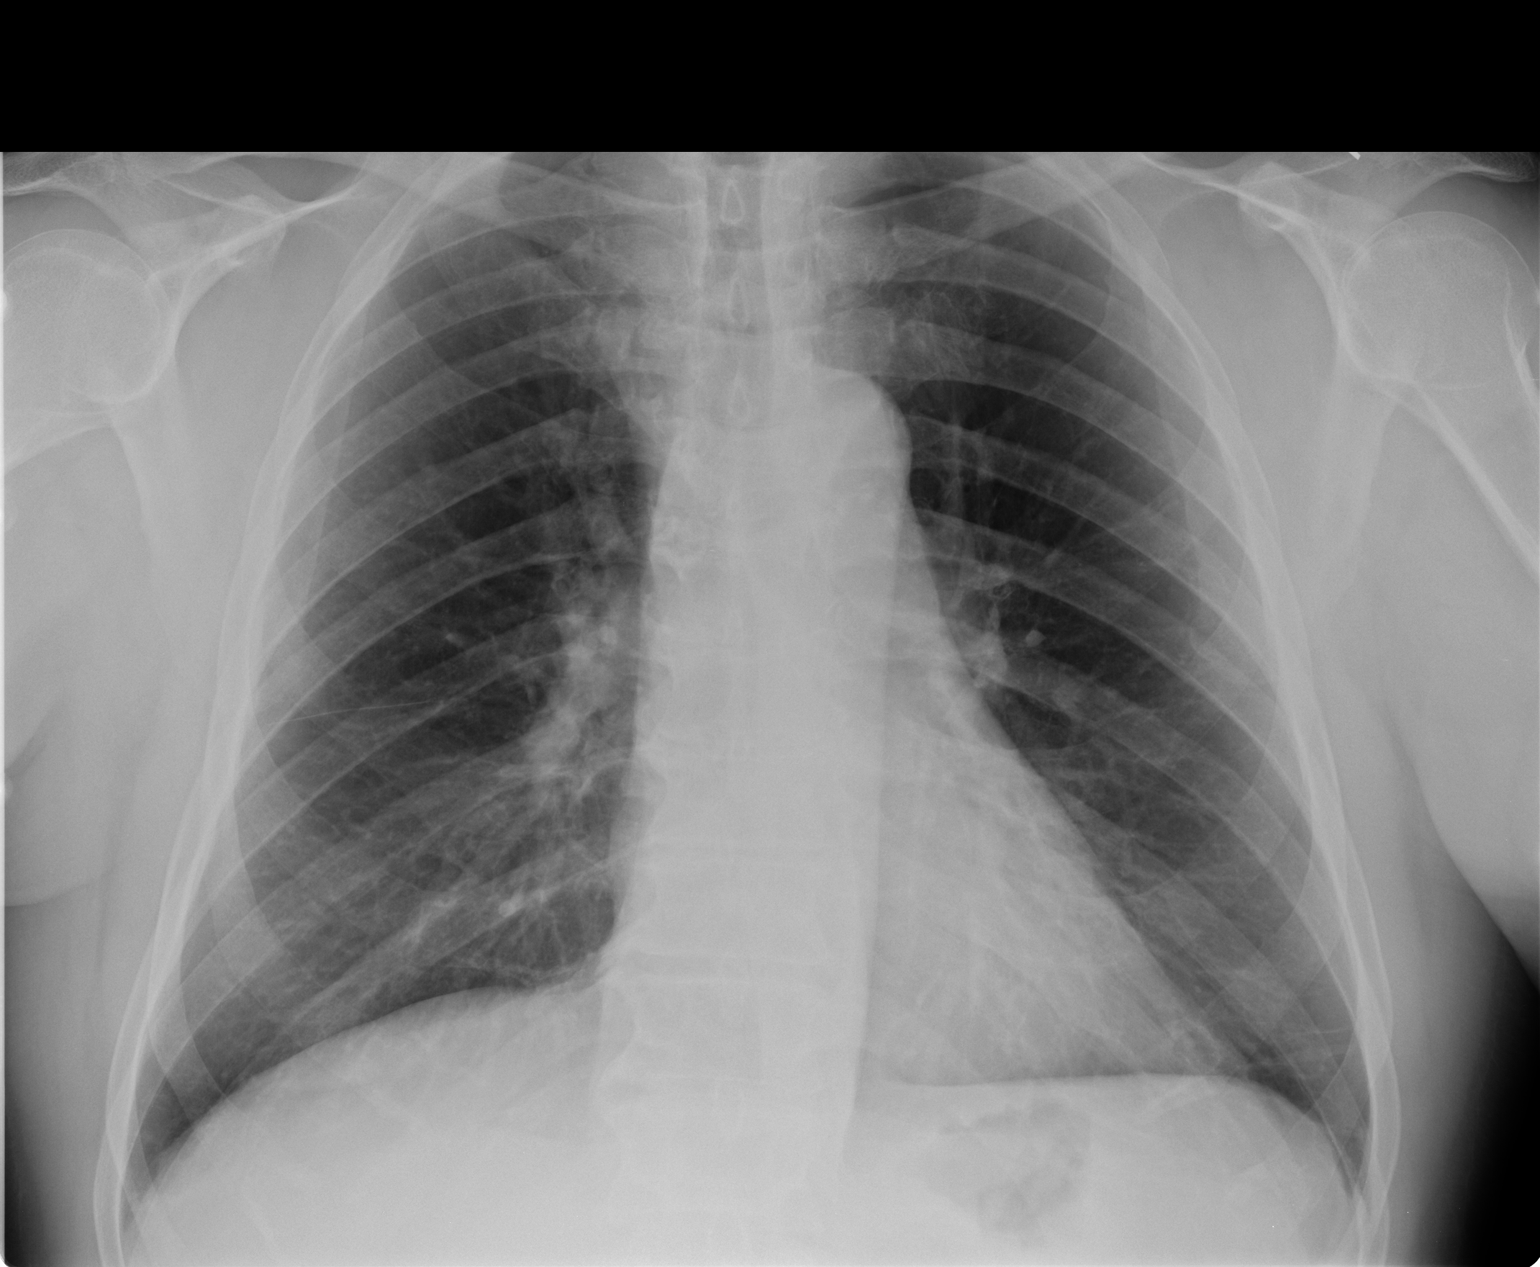

[view not recorded (2 of 2)]
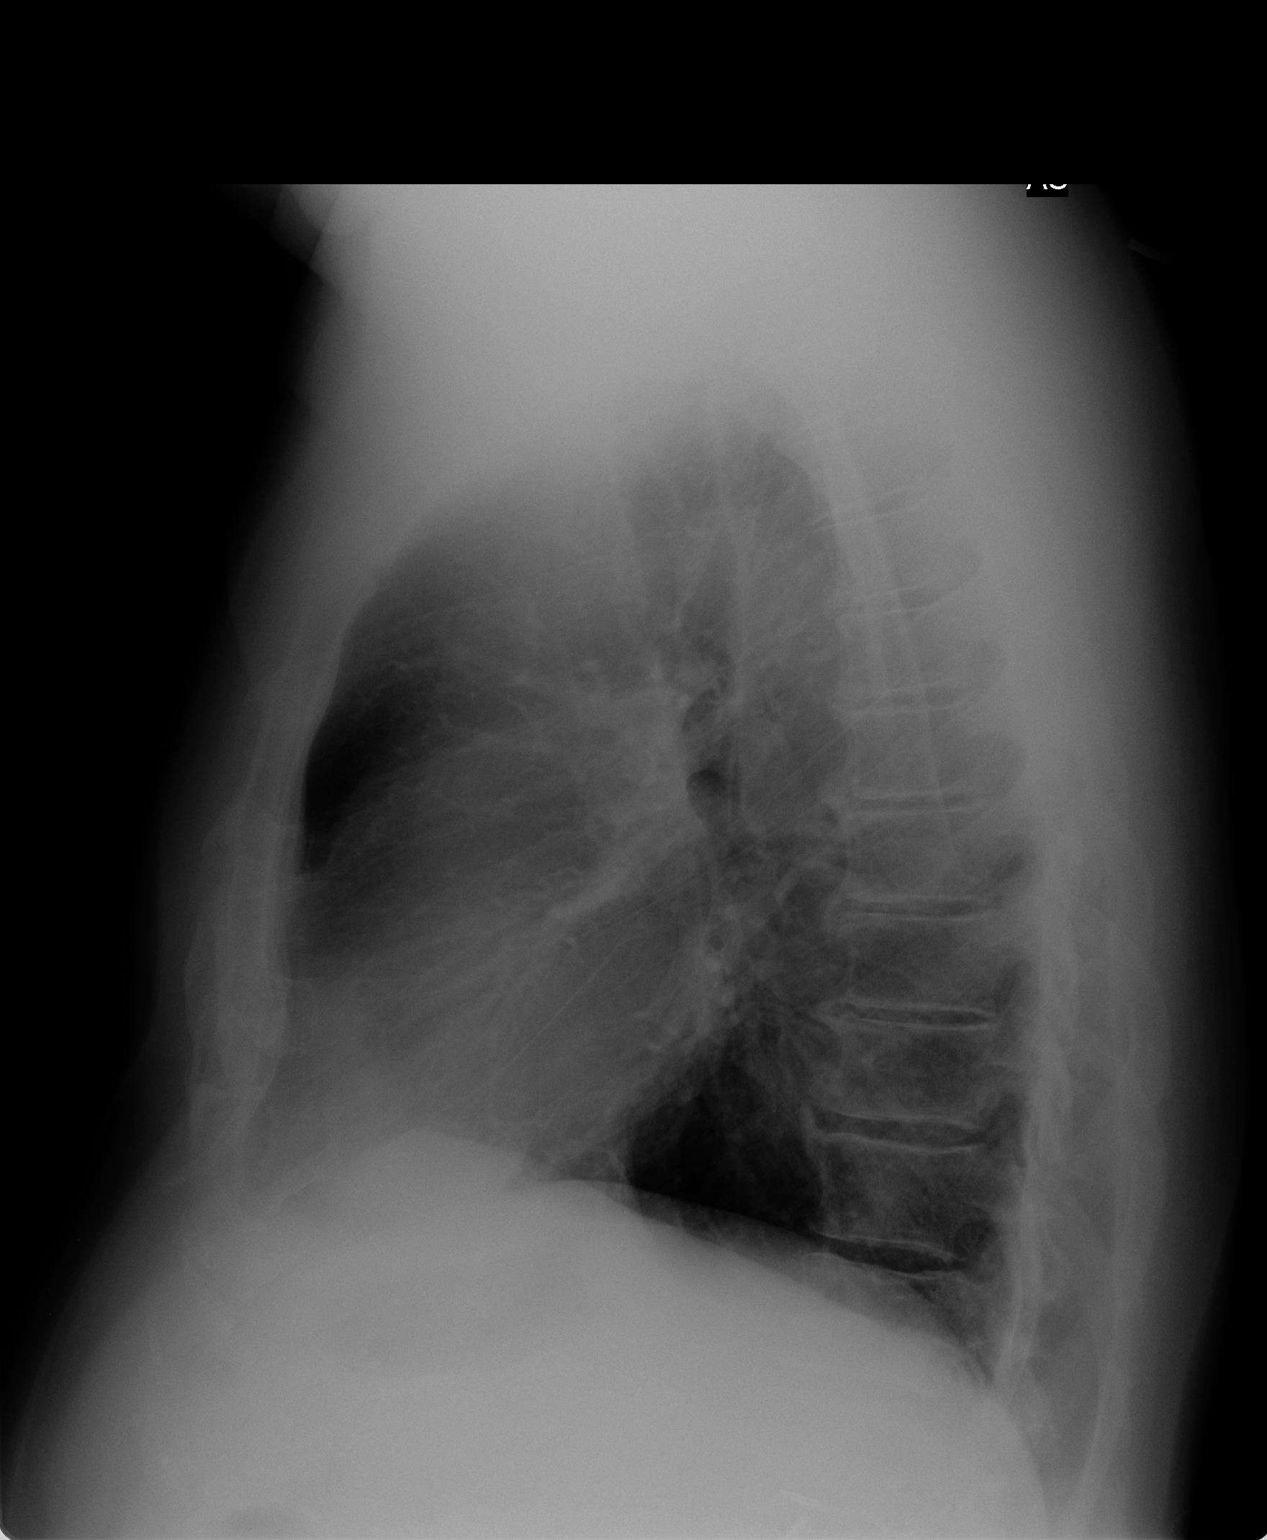

[2 of 2 positions shown; findings below may reference images not displayed]

FINDINGS: Cardiomediastinal silhouette is unremarkable. No acute infiltrate or
pleural effusion. No pulmonary edema. Mild degenerative changes
thoracic spine.
IMPRESSION: No active cardiopulmonary disease.

## 2016-08-15 ENCOUNTER — Other Ambulatory Visit: Payer: Self-pay | Admitting: Urology

## 2016-08-22 ENCOUNTER — Encounter (HOSPITAL_BASED_OUTPATIENT_CLINIC_OR_DEPARTMENT_OTHER): Payer: Self-pay | Admitting: *Deleted

## 2016-08-22 NOTE — Progress Notes (Signed)
NPO AFTER MN.  ARRIVE AT [id number].  NEEDS HG.  WILL TAKE FINASTERIDE AND ZOLOFT AM DOS W/ SIPS OF WATER.

## 2016-08-31 NOTE — H&P (Signed)
H&P  Chief Complaint: Large prostate/voiding difficulty, recurrent hematuria  History of Present Illness: 83 year old male presents at this time for definitive management of significant BPH with obstructive voiding issues.  Despite being on maximal medical therapy including finasteride and alpha blocker, he has moderate to severe symptomatology.  He also has recurrent initial gross hematuria, which is felt to be secondary to a large prostate.  Cystoscopically, he has an obstructive prostate.  At this point, he comes for transurethral resection of the prostate.  I discussed the procedure, several times in the office with the patient, and he is aware of the risks and complications of the procedure as well as alternatives.  He desires to proceed.  Past Medical History:  Diagnosis Date  . BPH (benign prostatic hyperplasia)   . Glaucoma, both eyes   . Hematuria, gross   . History of adenomatous polyp of colon   . History of colon cancer    dx 1993  s/p  hemicolectomy (malignant tumor x2)  and chemo therapy completed 1993--- no recurrence per pt  . History of shingles   . Hyperlipidemia   . Insomnia     Past Surgical History:  Procedure Laterality Date  . CARPAL TUNNEL RELEASE Left 08/13/2013   Procedure: CARPAL TUNNEL RELEASE LEFT WRIST;  Surgeon: Yvette Rack., MD;  Location: [facility];  Service: Orthopedics;  Laterality: Left;  . COLONOSCOPY    . HEMICOLECTOMY  1993   malignant tumor x2 , colon cancer and Appendectomy  . INGUINAL HERNIA REPAIR Right 2003  . MASTOIDECTOMY Right 2015  . SIGMOIDOSCOPY  last one 06/ 2018    Home Medications:  Allergies as of 08/31/2016   No Known Allergies     Medication List    Notice   Cannot display discharge medications because the patient has not yet been admitted.     Allergies: No Known Allergies  History reviewed. No pertinent family history.  Social History:  reports that he has never smoked. He has never used smokeless  tobacco. He reports that he does not drink alcohol or use drugs.  ROS: GU Review Male: Patient reports frequent urination, hard to postpone urination, burning/ pain with urination, and get up at night to urinate. Patient denies leakage of urine, stream starts and stops, trouble starting your stream, have to strain to urinate , erection problems, and penile pain. Gastrointestinal (Upper): Patient denies nausea, vomiting, and indigestion/ heartburn. Gastrointestinal (Lower): Patient denies diarrhea and constipation. Constitutional: Patient denies fever, night sweats, weight loss, and fatigue. Skin: Patient denies skin rash/ lesion and itching. Eyes: Patient denies blurred vision and double vision. Ears/ Nose/ Throat: Patient denies sore throat and sinus problems. Hematologic/Lymphatic: Patient denies easy bruising and swollen glands. Cardiovascular: Patient denies leg swelling and chest pains. Respiratory: Patient denies cough and shortness of breath. Endocrine: Patient denies excessive thirst. Musculoskeletal: Patient denies back pain and joint pain. Neurological: Patient denies headaches and dizziness. Psychologic: Patient denies depression and anxiety.   Physical Exam:  Vital signs in last 24 hours:   Constitutional:  Alert and oriented, No acute distress Cardiovascular: Regular rate and rhythm, No JVD Respiratory: Normal respiratory effort, Lungs clear bilaterally GI: Abdomen is soft, nontender, nondistended, no abdominal masses Genitourinary: No CVAT. Normal male phallus, testes are descended bilaterally and non-tender and without masses, scrotum is normal in appearance without lesions or masses, perineum is normal on inspection. Rectal: Normal sphincter tone, no rectal masses, prostate is non tender and without nodularity. Prostate size is  estimated to be 60 cc Lymphatic: No lymphadenopathy Neurologic: Grossly intact, no focal deficits Psychiatric: Normal mood and affect  Laboratory Data:  No  results for input(s): WBC, HGB, HCT, PLT in the last 72 hours.  No results for input(s): NA, K, CL, GLUCOSE, BUN, CALCIUM, CREATININE in the last 72 hours.  Invalid input(s): CO3   No results found for this or any previous visit (from the past 24 hour(s)). No results found for this or any previous visit (from the past 240 hour(s)).  Renal Function: No results for input(s): CREATININE in the last 168 hours. CrCl cannot be calculated (No order found.).  Radiologic Imaging: No results found.  Impression/Assessment:  BPH with severe symptomatology and recurrent hematuria  Plan:  Transurethral resection of prostate

## 2016-09-02 ENCOUNTER — Encounter (HOSPITAL_COMMUNITY)
Admission: RE | Disposition: A | Discharge status: Home / Self Care | Payer: Self-pay | Source: Ambulatory Visit | Attending: Urology

## 2016-09-02 ENCOUNTER — Ambulatory Visit (HOSPITAL_BASED_OUTPATIENT_CLINIC_OR_DEPARTMENT_OTHER): Payer: Medicare Other | Admitting: Anesthesiology

## 2016-09-02 ENCOUNTER — Observation Stay (HOSPITAL_BASED_OUTPATIENT_CLINIC_OR_DEPARTMENT_OTHER)
Admission: RE | Admit: 2016-09-02 | Discharge: 2016-09-03 | Disposition: A | Discharge status: Home / Self Care | Payer: Medicare Other | Source: Ambulatory Visit | Attending: Urology | Admitting: Urology

## 2016-09-02 ENCOUNTER — Encounter (HOSPITAL_BASED_OUTPATIENT_CLINIC_OR_DEPARTMENT_OTHER): Payer: Self-pay | Admitting: Anesthesiology

## 2016-09-02 DIAGNOSIS — H409 Unspecified glaucoma: Secondary | ICD-10-CM | POA: Insufficient documentation

## 2016-09-02 DIAGNOSIS — Z79899 Other long term (current) drug therapy: Secondary | ICD-10-CM | POA: Diagnosis not present

## 2016-09-02 DIAGNOSIS — F329 Major depressive disorder, single episode, unspecified: Secondary | ICD-10-CM | POA: Insufficient documentation

## 2016-09-02 DIAGNOSIS — N320 Bladder-neck obstruction: Secondary | ICD-10-CM | POA: Diagnosis present

## 2016-09-02 DIAGNOSIS — C61 Malignant neoplasm of prostate: Principal | ICD-10-CM | POA: Insufficient documentation

## 2016-09-02 DIAGNOSIS — E669 Obesity, unspecified: Secondary | ICD-10-CM | POA: Diagnosis not present

## 2016-09-02 DIAGNOSIS — Z85038 Personal history of other malignant neoplasm of large intestine: Secondary | ICD-10-CM | POA: Insufficient documentation

## 2016-09-02 DIAGNOSIS — Z6831 Body mass index (BMI) 31.0-31.9, adult: Secondary | ICD-10-CM | POA: Insufficient documentation

## 2016-09-02 DIAGNOSIS — E785 Hyperlipidemia, unspecified: Secondary | ICD-10-CM | POA: Diagnosis not present

## 2016-09-02 DIAGNOSIS — N401 Enlarged prostate with lower urinary tract symptoms: Secondary | ICD-10-CM | POA: Diagnosis present

## 2016-09-02 DIAGNOSIS — G4700 Insomnia, unspecified: Secondary | ICD-10-CM | POA: Diagnosis not present

## 2016-09-02 DIAGNOSIS — N138 Other obstructive and reflux uropathy: Secondary | ICD-10-CM | POA: Diagnosis not present

## 2016-09-02 HISTORY — DX: Personal history of colonic polyps: Z86.010

## 2016-09-02 HISTORY — PX: TRANSURETHRAL RESECTION OF PROSTATE: SHX73

## 2016-09-02 HISTORY — DX: Personal history of other malignant neoplasm of large intestine: Z85.038

## 2016-09-02 HISTORY — DX: Gross hematuria: R31.0

## 2016-09-02 HISTORY — DX: Personal history of adenomatous and serrated colon polyps: Z86.0101

## 2016-09-02 LAB — POCT HEMOGLOBIN-HEMACUE: HEMOGLOBIN: 15.2 g/dL (ref 13.0–17.0)

## 2016-09-02 SURGERY — TURP (TRANSURETHRAL RESECTION OF PROSTATE)
Anesthesia: General

## 2016-09-02 MED ORDER — DEXAMETHASONE SODIUM PHOSPHATE 10 MG/ML IJ SOLN
INTRAMUSCULAR | Status: DC | PRN
  Administered 2016-09-02: 10 mg via INTRAVENOUS

## 2016-09-02 MED ORDER — SULFAMETHOXAZOLE-TRIMETHOPRIM 800-160 MG PO TABS
1.0000 | ORAL_TABLET | Freq: Two times a day (BID) | ORAL | Status: DC
  Administered 2016-09-02 – 2016-09-03 (×3): 1 via ORAL
  Filled 2016-09-02 (×2): qty 1

## 2016-09-02 MED ORDER — PRAVASTATIN SODIUM 20 MG PO TABS
10.0000 mg | ORAL_TABLET | Freq: Every day | ORAL | Status: DC
  Administered 2016-09-02: 10 mg via ORAL
  Filled 2016-09-02: qty 1

## 2016-09-02 MED ORDER — SODIUM CHLORIDE 0.9 % IR SOLN
Status: DC | PRN
  Administered 2016-09-02 (×3): 3000 mL via INTRAVESICAL

## 2016-09-02 MED ORDER — ZOLPIDEM TARTRATE 5 MG PO TABS
5.0000 mg | ORAL_TABLET | Freq: Every evening | ORAL | Status: DC | PRN
  Administered 2016-09-03: 5 mg via ORAL
  Filled 2016-09-02: qty 1

## 2016-09-02 MED ORDER — ONDANSETRON HCL 4 MG/2ML IJ SOLN: 4.0000 mg | INTRAMUSCULAR | Status: DC | PRN

## 2016-09-02 MED ORDER — FENTANYL CITRATE (PF) 100 MCG/2ML IJ SOLN
25.0000 ug | INTRAMUSCULAR | Status: DC | PRN
  Filled 2016-09-02: qty 1

## 2016-09-02 MED ORDER — PROMETHAZINE HCL 25 MG/ML IJ SOLN
6.2500 mg | INTRAMUSCULAR | Status: DC | PRN
  Filled 2016-09-02: qty 1

## 2016-09-02 MED ORDER — SODIUM CHLORIDE 0.45 % IV SOLN
INTRAVENOUS | Status: DC
  Administered 2016-09-02 – 2016-09-03 (×2): via INTRAVENOUS

## 2016-09-02 MED ORDER — PROPOFOL 10 MG/ML IV BOLUS
INTRAVENOUS | Status: AC
  Filled 2016-09-02: qty 20

## 2016-09-02 MED ORDER — SERTRALINE HCL 25 MG PO TABS
25.0000 mg | ORAL_TABLET | Freq: Every morning | ORAL | Status: DC
  Administered 2016-09-03: 25 mg via ORAL
  Filled 2016-09-02: qty 1

## 2016-09-02 MED ORDER — BELLADONNA ALKALOIDS-OPIUM 16.2-60 MG RE SUPP: 1.0000 | Freq: Four times a day (QID) | RECTAL | Status: DC | PRN

## 2016-09-02 MED ORDER — LIDOCAINE 2% (20 MG/ML) 5 ML SYRINGE
INTRAMUSCULAR | Status: DC | PRN
  Administered 2016-09-02: 80 mg via INTRAVENOUS

## 2016-09-02 MED ORDER — EPHEDRINE SULFATE-NACL 50-0.9 MG/10ML-% IV SOSY
PREFILLED_SYRINGE | INTRAVENOUS | Status: DC | PRN
Start: 2016-09-02 — End: 2016-09-02
  Administered 2016-09-02 (×2): 10 mg via INTRAVENOUS
  Administered 2016-09-02: 5 mg via INTRAVENOUS

## 2016-09-02 MED ORDER — ACETAMINOPHEN 325 MG PO TABS: 650.0000 mg | ORAL_TABLET | ORAL | Status: DC | PRN

## 2016-09-02 MED ORDER — SENNA 8.6 MG PO TABS
1.0000 | ORAL_TABLET | Freq: Two times a day (BID) | ORAL | Status: DC
  Administered 2016-09-02: 8.6 mg via ORAL
  Filled 2016-09-02 (×2): qty 1

## 2016-09-02 MED ORDER — EPHEDRINE 5 MG/ML INJ
INTRAVENOUS | Status: AC
  Filled 2016-09-02: qty 10

## 2016-09-02 MED ORDER — CEFAZOLIN SODIUM-DEXTROSE 2-4 GM/100ML-% IV SOLN
2.0000 g | INTRAVENOUS | Status: AC
  Administered 2016-09-02: 2 g via INTRAVENOUS
  Filled 2016-09-02: qty 100

## 2016-09-02 MED ORDER — ONDANSETRON HCL 4 MG/2ML IJ SOLN
INTRAMUSCULAR | Status: DC | PRN
  Administered 2016-09-02: 4 mg via INTRAVENOUS

## 2016-09-02 MED ORDER — MEPERIDINE HCL 25 MG/ML IJ SOLN
6.2500 mg | INTRAMUSCULAR | Status: DC | PRN
  Filled 2016-09-02: qty 1

## 2016-09-02 MED ORDER — FENTANYL CITRATE (PF) 100 MCG/2ML IJ SOLN
INTRAMUSCULAR | Status: AC
  Filled 2016-09-02: qty 2

## 2016-09-02 MED ORDER — CEFAZOLIN SODIUM-DEXTROSE 2-4 GM/100ML-% IV SOLN
INTRAVENOUS | Status: AC
  Filled 2016-09-02: qty 100

## 2016-09-02 MED ORDER — ONDANSETRON HCL 4 MG/2ML IJ SOLN
INTRAMUSCULAR | Status: AC
  Filled 2016-09-02: qty 2

## 2016-09-02 MED ORDER — KETOROLAC TROMETHAMINE 30 MG/ML IJ SOLN
INTRAMUSCULAR | Status: AC
  Filled 2016-09-02: qty 1

## 2016-09-02 MED ORDER — DEXAMETHASONE SODIUM PHOSPHATE 10 MG/ML IJ SOLN
INTRAMUSCULAR | Status: AC
  Filled 2016-09-02: qty 1

## 2016-09-02 MED ORDER — FENTANYL CITRATE (PF) 100 MCG/2ML IJ SOLN
INTRAMUSCULAR | Status: DC | PRN
Start: 2016-09-02 — End: 2016-09-02
  Administered 2016-09-02 (×2): 25 ug via INTRAVENOUS
  Administered 2016-09-02: 50 ug via INTRAVENOUS

## 2016-09-02 MED ORDER — LATANOPROST 0.005 % OP SOLN
1.0000 [drp] | Freq: Every day | OPHTHALMIC | Status: DC
  Administered 2016-09-02: 1 [drp] via OPHTHALMIC
  Filled 2016-09-02: qty 2.5

## 2016-09-02 MED ORDER — PHENYLEPHRINE 40 MCG/ML (10ML) SYRINGE FOR IV PUSH (FOR BLOOD PRESSURE SUPPORT)
PREFILLED_SYRINGE | INTRAVENOUS | Status: DC | PRN
  Administered 2016-09-02 (×2): 80 ug via INTRAVENOUS

## 2016-09-02 MED ORDER — SODIUM CHLORIDE 0.9 % IR SOLN
3000.0000 mL | Status: DC
  Administered 2016-09-02 – 2016-09-03 (×2): 3000 mL
  Filled 2016-09-02: qty 3000

## 2016-09-02 MED ORDER — LACTATED RINGERS IV SOLN
INTRAVENOUS | Status: DC
  Administered 2016-09-02: 07:00:00 via INTRAVENOUS
  Filled 2016-09-02: qty 1000

## 2016-09-02 MED ORDER — PROPOFOL 10 MG/ML IV BOLUS
INTRAVENOUS | Status: DC | PRN
  Administered 2016-09-02: 180 mg via INTRAVENOUS
  Administered 2016-09-02: 20 mg via INTRAVENOUS

## 2016-09-02 SURGICAL SUPPLY — 32 items
BAG DRAIN URO-CYSTO SKYTR STRL (DRAIN) ×3 IMPLANT
BAG URINE DRAINAGE (UROLOGICAL SUPPLIES) IMPLANT
BAG URINE LEG 19OZ MD ST LTX (BAG) IMPLANT
CATH FOLEY 2WAY SLVR  5CC 20FR (CATHETERS)
CATH FOLEY 2WAY SLVR  5CC 22FR (CATHETERS)
CATH FOLEY 2WAY SLVR 30CC 22FR (CATHETERS) IMPLANT
CATH FOLEY 2WAY SLVR 5CC 20FR (CATHETERS) IMPLANT
CATH FOLEY 2WAY SLVR 5CC 22FR (CATHETERS) IMPLANT
CATH FOLEY 3WAY 30CC 22F (CATHETERS) ×3 IMPLANT
CATH HEMA 3WAY 30CC 24FR COUDE (CATHETERS) IMPLANT
CATH HEMA 3WAY 30CC 24FR RND (CATHETERS) IMPLANT
CLOTH BEACON ORANGE TIMEOUT ST (SAFETY) ×3 IMPLANT
ELECT BUTTON BIOP 24F 90D PLAS (MISCELLANEOUS) IMPLANT
ELECT REM PT RETURN 9FT ADLT (ELECTROSURGICAL) ×3
ELECTRODE REM PT RTRN 9FT ADLT (ELECTROSURGICAL) ×1 IMPLANT
EVACUATOR MICROVAS BLADDER (UROLOGICAL SUPPLIES) IMPLANT
GLOVE BIO SURGEON STRL SZ8 (GLOVE) ×3 IMPLANT
GOWN STRL REUS W/ TWL LRG LVL3 (GOWN DISPOSABLE) ×1 IMPLANT
GOWN STRL REUS W/ TWL XL LVL3 (GOWN DISPOSABLE) ×1 IMPLANT
GOWN STRL REUS W/TWL LRG LVL3 (GOWN DISPOSABLE) ×2
GOWN STRL REUS W/TWL XL LVL3 (GOWN DISPOSABLE) ×2
HOLDER FOLEY CATH W/STRAP (MISCELLANEOUS) IMPLANT
IV NS IRRIG 3000ML ARTHROMATIC (IV SOLUTION) ×21 IMPLANT
KIT RM TURNOVER CYSTO AR (KITS) ×3 IMPLANT
MANIFOLD NEPTUNE II (INSTRUMENTS) IMPLANT
PACK CYSTO (CUSTOM PROCEDURE TRAY) ×3 IMPLANT
PLUG CATH AND CAP STER (CATHETERS) IMPLANT
SET ASPIRATION TUBING (TUBING) IMPLANT
SYR 30ML LL (SYRINGE) IMPLANT
SYRINGE IRR TOOMEY STRL 70CC (SYRINGE) IMPLANT
TUBE CONNECTING 12'X1/4 (SUCTIONS)
TUBE CONNECTING 12X1/4 (SUCTIONS) IMPLANT

## 2016-09-02 NOTE — Interval H&P Note (Signed)
History and Physical Interval Note:  09/02/2016 8:43 AM  Jonathan Marshall  has presented today for surgery, with the diagnosis of BENIGN PROSTATIC HYPERPLASIA  The various methods of treatment have been discussed with the patient and family. After consideration of risks, benefits and other options for treatment, the patient has consented to  Procedure(s): TRANSURETHRAL RESECTION OF THE PROSTATE (TURP) (N/A) as a surgical intervention .  The patient's history has been reviewed, patient examined, no change in status, stable for surgery.  I have reviewed the patient's chart and labs.  Questions were answered to the patient's satisfaction.     Jorja Loa

## 2016-09-02 NOTE — Anesthesia Preprocedure Evaluation (Addendum)
Anesthesia Evaluation    Airway Mallampati: I  TM Distance: >3 FB Neck ROM: Full    Dental no notable dental hx. (+) Teeth Intact   Pulmonary neg pulmonary ROS,    Pulmonary exam normal breath sounds clear to auscultation       Cardiovascular negative cardio ROS   Rhythm:Regular Rate:Normal     Neuro/Psych Depression Glaucoma ou Hx/o shingles    GI/Hepatic Neg liver ROS, Hx/o colon polyps Hx/o colon Ca- S/P hemicolectomy and chemoRx - 1993   Endo/Other  Obesity Hyperlipidemia  Renal/GU negative Renal ROS   BPH    Musculoskeletal negative musculoskeletal ROS (+)   Abdominal (+) + obese,   Peds  Hematology negative hematology ROS (+)   Anesthesia Other Findings   Reproductive/Obstetrics                            Anesthesia Physical Anesthesia Plan  ASA: II  Anesthesia Plan: General   Post-op Pain Management:    Induction: Intravenous  PONV Risk Score and Plan: 3 and Ondansetron, Dexamethasone, Propofol, Promethazine and Treatment may vary due to age or medical condition  Airway Management Planned: LMA  Additional Equipment:   Intra-op Plan:   Post-operative Plan: Extubation in OR  Informed Consent: I have reviewed the patients History and Physical, chart, labs and discussed the procedure including the risks, benefits and alternatives for the proposed anesthesia with the patient or authorized representative who has indicated his/her understanding and acceptance.   Dental advisory given  Plan Discussed with: CRNA, Anesthesiologist and Surgeon  Anesthesia Plan Comments:         Anesthesia Quick Evaluation

## 2016-09-02 NOTE — Interval H&P Note (Signed)
History and Physical Interval Note:  09/02/2016 7:43 AM  Jonathan Marshall  has presented today for surgery, with the diagnosis of BENIGN PROSTATIC HYPERPLASIA  The various methods of treatment have been discussed with the patient and family. After consideration of risks, benefits and other options for treatment, the patient has consented to  Procedure(s): TRANSURETHRAL RESECTION OF THE PROSTATE (TURP) (N/A) as a surgical intervention .  The patient's history has been reviewed, patient examined, no change in status, stable for surgery.  I have reviewed the patient's chart and labs.  Questions were answered to the patient's satisfaction.     Jorja Loa

## 2016-09-02 NOTE — Anesthesia Postprocedure Evaluation (Signed)
Anesthesia Post Note  Patient: Jonathan Marshall  Procedure(s) Performed: Procedure(s) (LRB): TRANSURETHRAL RESECTION OF THE PROSTATE (TURP) (N/A)     Patient location during evaluation: PACU Anesthesia Type: General Level of consciousness: awake and alert and oriented Pain management: pain level controlled Vital Signs Assessment: post-procedure vital signs reviewed and stable Respiratory status: spontaneous breathing, nonlabored ventilation and respiratory function stable Cardiovascular status: blood pressure returned to baseline and stable Postop Assessment: no signs of nausea or vomiting Anesthetic complications: no    Last Vitals:  Vitals:   09/02/16 1100 09/02/16 1115  BP: 131/71 139/77  Pulse: 66 68  Resp: 15 17  Temp:      Last Pain:  Vitals:   09/02/16 1017  TempSrc:   PainSc: Asleep                 Bradie Sangiovanni A.

## 2016-09-02 NOTE — Anesthesia Procedure Notes (Signed)
Procedure Name: LMA Insertion Date/Time: 09/02/2016 8:58 AM Performed by: Bethena Roys T Pre-anesthesia Checklist: Patient identified, Emergency Drugs available, Suction available and Patient being monitored Patient Re-evaluated:Patient Re-evaluated prior to inductionOxygen Delivery Method: Circle system utilized Preoxygenation: Pre-oxygenation with 100% oxygen Intubation Type: IV induction Ventilation: Mask ventilation without difficulty LMA: LMA inserted LMA Size: 5.0 Number of attempts: 1 Airway Equipment and Method: Bite block Placement Confirmation: positive ETCO2 Dental Injury: Teeth and Oropharynx as per pre-operative assessment

## 2016-09-02 NOTE — Discharge Instructions (Signed)
Transurethral Resection of the Prostate ° °Care After ° °Refer to this sheet in the next few weeks. These discharge instructions provide you with general information on caring for yourself after you leave the hospital. Your caregiver may also give you specific instructions. Your treatment has been planned according to the most current medical practices available, but unavoidable complications sometimes occur. If you have any problems or questions after discharge, please call your caregiver. ° °HOME CARE INSTRUCTIONS  ° °Medications °· You may receive medicine for pain management. As your level of discomfort decreases, adjustments in your pain medicines may be made.  °· Take all medicines as directed.  °· You may be given a medicine (antibiotic) to kill germs following surgery. Finish all medicines. Let your caregiver know if you have any side effects or problems from the medicine.  °· If you are on aspirin, it would be best not to restart the aspirin until the blood in the urine clears °Hygiene °· You can take a shower after surgery.  °· You should not take a bath while you still have the urethral catheter. °Activity °· You will be encouraged to get out of bed as much as possible and increase your activity level as tolerated.  °· Spend the first week in and around your home. For 3 weeks, avoid the following:  °· Straining.  °· Running.  °· Strenuous work.  °· Walks longer than a few blocks.  °· Riding for extended periods.  °· Sexual relations.  °· Do not lift heavy objects (more than 20 pounds) for at least 1 month. When lifting, use your arms instead of your abdominal muscles.  °· You will be encouraged to walk as tolerated. Do not exert yourself. Increase your activity level slowly. Remember that it is important to keep moving after an operation of any type. This cuts down on the possibility of developing blood clots.  °· Your caregiver will tell you when you can resume driving and light housework. Discuss this  at your first office visit after discharge. °Diet °· No special diet is ordered after a TURP. However, if you are on a special diet for another medical problem, it should be continued.  °· Normal fluid intake is usually recommended.  °· Avoid alcohol and caffeinated drinks for 2 weeks. They irritate the bladder. Decaffeinated drinks are okay.  °· Avoid spicy foods.  °Bladder Function °· For the first 10 days, empty the bladder whenever you feel a definite desire. Do not try to hold the urine for long periods of time.  °· Urinating once or twice a night even after you are healed is not uncommon.  °· You may see some recurrence of blood in the urine after discharge from the hospital. This usually happens within 2 weeks after the procedure.If this occurs, force fluids again as you did in the hospital and reduce your activity.  °Bowel Function °· You may experience some constipation after surgery. This can be minimized by increasing fluids and fiber in your diet. Drink enough water and fluids to keep your urine clear or pale yellow.  °· A stool softener may be prescribed for use at home. Do not strain to move your bowels.  °· If you are requiring increased pain medicine, it is important that you take stool softeners to prevent constipation. This will help to promote proper healing by reducing the need to strain to move your bowels.  °Sexual Activity °· Semen movement in the opposite direction and into the bladder (  retrograde ejaculation) may occur. Since the semen passes into the bladder, cloudy urine can occur the first time you urinate after intercourse. Or, you may not have an ejaculation during erection. Ask your caregiver when you can resume sexual activity. Retrograde ejaculation and reduced semen discharge should not reduce one's pleasure of intercourse.  °Postoperative Visit °· Arrange the date and time of your after surgery visit with your caregiver.  °Return to Work °· After your recovery is complete, you will  be able to return to work and resume all activities. Your caregiver will inform you when you can return to work.  °Foley Catheter Care °A soft, flexible tube (Foley catheter) may have been placed in your bladder to drain urine and fluid. Follow these instructions: °Taking Care of the Catheter °· Keep the area where the catheter leaves your body clean.  °· Attach the catheter to the leg so there is no tension on the catheter.  °· Keep the drainage bag below the level of the bladder, but keep it OFF the floor.  °· Do not take long soaking baths. Your caregiver will give instructions about showering.  °· Wash your hands before touching ANYTHING related to the catheter or bag.  °· Using mild soap and warm water on a washcloth:  °· Clean the area closest to the catheter insertion site using a circular motion around the catheter.  °· Clean the catheter itself by wiping AWAY from the insertion site for several inches down the tube.  °· NEVER wipe upward as this could sweep bacteria up into the urethra (tube in your body that normally drains the bladder) and cause infection.  °· Place a small amount of sterile lubricant at the tip of the penis where the catheter is entering.  °Taking Care of the Drainage Bags °· Two drainage bags may be taken home: a large overnight drainage bag, and a smaller leg bag which fits underneath clothing.  °· It is okay to wear the overnight bag at any time, but NEVER wear the smaller leg bag at night.  °· Keep the drainage bag well below the level of your bladder. This prevents backflow of urine into the bladder and allows the urine to drain freely.  °· Anchor the tubing to your leg to prevent pulling or tension on the catheter. Use tape or a leg strap provided by the hospital.  °· Empty the drainage bag when it is 1/2 to 3/4 full. Wash your hands before and after touching the bag.  °· Periodically check the tubing for kinks to make sure there is no pressure on the tubing which could restrict  the flow of urine.  °Changing the Drainage Bags °· Cleanse both ends of the clean bag with alcohol before changing.  °· Pinch off the rubber catheter to avoid urine spillage during the disconnection.  °· Disconnect the dirty bag and connect the clean one.  °· Empty the dirty bag carefully to avoid a urine spill.  °· Attach the new bag to the leg with tape or a leg strap.  °Cleaning the Drainage Bags °· Whenever a drainage bag is disconnected, it must be cleaned quickly so it is ready for the next use.  °· Wash the bag in warm, soapy water.  °· Rinse the bag thoroughly with warm water.  °· Soak the bag for 30 minutes in a solution of white vinegar and water (1 cup vinegar to 1 quart warm water).  °· Rinse with warm water.  °SEEK MEDICAL   CARE IF:  °· You have chills or night sweats.  °· You are leaking around your catheter or have problems with your catheter. It is not uncommon to have sporadic leakage around your catheter as a result of bladder spasms. If the leakage stops, there is not much need for concern. If you are uncertain, call your caregiver.  °· You develop side effects that you think are coming from your medicines.  °SEEK IMMEDIATE MEDICAL CARE IF:  °· You are suddenly unable to urinate. Check to see if there are any kinks in the drainage tubing that may cause this. If you cannot find any kinks, call your caregiver immediately. This is an emergency.  °· You develop shortness of breath or chest pains.  °· Bleeding persists or clots develop in your urine.  °· You have a fever.  °· You develop pain in your back or over your lower belly (abdomen).  °· You develop pain or swelling in your legs.  °· Any problems you are having get worse rather than better.  °MAKE SURE YOU:  °· Understand these instructions.  °· Will watch your condition.  °· Will get help right away if you are not doing well or get worse.  °Document Released: 02/11/2005 Document Revised: 10/24/2010 Document Reviewed: 10/05/2008 °[employer]®  Patient Information ©2012 [employer].Transurethral Resection of the Prostate °Care After °Refer to this sheet in the next few weeks. These discharge instructions provide you with general information on caring for yourself after you leave the hospital. Your caregiver may also give you specific instructions. Your treatment has been planned according to the most current medical practices available, but unavoidable complications sometimes occur. If you have any problems or questions after discharge, please call your caregiver. °

## 2016-09-02 NOTE — Op Note (Signed)
Preoperative diagnosis: 1. Bladder outlet obstruction secondary to BPH  Postoperative diagnosis:  1. Bladder outlet obstruction secondary to BPH  Procedure:  1. Cystoscopy 2. Transurethral resection of the prostate  Surgeon: Lillette Boxer. Ryelan Kazee, M.D.  Anesthesia: General  Complications: None  Drain: Foley catheter--22 fr 3 way hematuria  EBL: Minimal  Specimens: 1. Prostate chips  Disposition of specimens: Pathology  Indication: Jonathan Marshall is a patient with bladder outlet obstruction secondary to benign prostatic hyperplasia. After reviewing the management options for treatment, he elected to proceed with the above surgical procedure(s). We have discussed the potential benefits and risks of the procedure, side effects of the proposed treatment, the likelihood of the patient achieving the goals of the procedure, and any potential problems that might occur during the procedure or recuperation. Informed consent has been obtained.  Description of procedure:  The patient was identified in the holding area.He received preoperative antibiotics. He was then taken to the operating room. General anesthetic was administered.  The patient was then placed in the dorsal lithotomy position, prepped and draped in the usual sterile fashion. Timeout was then performed.  His urethra meatus was gently dilated to 30 [place] with [employer] sounds.  A resectoscope sheath was placed using the obturator, and the resectoscope, loop and telescope were placed.  The bladder was then systematically examined in its entirety. There was no evidence of  tumors, stones, or other mucosal pathology.  The ureteral orifices were identified and marked so as to be avoided during the procedure.  The prostate adenoma was then resected utilizing loop cautery resection with the bipolar cutting loop.  The prostate adenoma from the bladder neck back to the verumontanum was resected beginning at the six o'clock position  and then extended to include the right and left lobes of the prostate and anterior prostate, respectively. Care was taken not to resect distal to the verumontanum.  Hemostasis was then achieved with the cautery and the bladder was emptied and reinspected with no significant bleeding noted at the end of the procedure.  Resected chips were irrigated from the bladder with the evacuator and sent to pathology.  A 3 way catheter was then placed into the bladder and placed on continuous bladder irrigation.  The patient appeared to tolerate the procedure well and without complications. The patient was able to be awakened and transferred to the recovery unit in satisfactory condition. He tolerated the procedure well.

## 2016-09-02 NOTE — Transfer of Care (Signed)
Immediate Anesthesia Transfer of Care Note  Patient: Jonathan Marshall  Procedure(s) Performed: Procedure(s): TRANSURETHRAL RESECTION OF THE PROSTATE (TURP) (N/A)  Patient Location: PACU  Anesthesia Type:General  Level of Consciousness: sedated and responds to stimulation  Airway & Oxygen Therapy: Patient Spontanous Breathing and Patient connected to nasal cannula oxygen  Post-op Assessment: Report given to RN  Post vital signs: Reviewed and stable  Last Vitals: 135/70, 71, 15, 99%, 98.0 Vitals:   09/02/16 0651  BP: 139/67  Pulse: (!) 56  Resp: 16  Temp: 36.4 C    Last Pain:  Vitals:   09/02/16 0651  TempSrc: Oral      Patients Stated Pain Goal: 6 (76/18/48 5927)  Complications: No apparent anesthesia complications

## 2016-09-03 ENCOUNTER — Encounter (HOSPITAL_BASED_OUTPATIENT_CLINIC_OR_DEPARTMENT_OTHER): Payer: Self-pay | Admitting: Urology

## 2016-09-03 DIAGNOSIS — C61 Malignant neoplasm of prostate: Secondary | ICD-10-CM | POA: Diagnosis not present

## 2016-09-03 MED ORDER — CEPHALEXIN 500 MG PO CAPS: 500.0000 mg | ORAL_CAPSULE | Freq: Two times a day (BID) | ORAL | 0 refills | Status: DC

## 2016-09-03 NOTE — Discharge Summary (Signed)
Physician Discharge Summary  Patient ID: Jonathan Marshall MRN: [id number] DOB/AGE: 1932/05/05 83 y.o.  Admit date: 09/02/2016 Discharge date: 09/03/2016  Admission Diagnoses:  Enlarged prostate with urinary obstruction  Discharge Diagnoses:  Principal Problem:   Enlarged prostate with urinary obstruction   Past Medical History:  Diagnosis Date  . BPH (benign prostatic hyperplasia)   . Glaucoma, both eyes   . Hematuria, gross   . History of adenomatous polyp of colon   . History of colon cancer    dx 1993  s/p  hemicolectomy (malignant tumor x2)  and chemo therapy completed 1993--- no recurrence per pt  . History of shingles   . Hyperlipidemia   . Insomnia     Surgeries: Procedure(s): TRANSURETHRAL RESECTION OF THE PROSTATE (TURP) on 09/02/2016   Consultants (if any):   Discharged Condition: Improved  Hospital Course: Jonathan Marshall is an 83 y.o. male who was admitted 09/02/2016 with a diagnosis of Enlarged prostate with urinary obstruction and went to the operating room on 09/02/2016 and underwent the above named procedures.   Foley was removed this morning and he has voided 3 x.   He was felt to be ready for D/C home.   He was given perioperative antibiotics:  Anti-infectives    Start     Dose/Rate Route Frequency Ordered Stop   09/03/16 0000  cephALEXin (KEFLEX) 500 MG capsule     500 mg Oral 2 times daily 09/03/16 0525     09/02/16 1215  sulfamethoxazole-trimethoprim (BACTRIM DS,SEPTRA DS) 800-160 MG per tablet 1 tablet     1 tablet Oral Every 12 hours 09/02/16 1205     09/02/16 0702  ceFAZolin (ANCEF) IVPB 2g/100 mL premix     2 g 200 mL/hr over 30 Minutes Intravenous 30 min pre-op 09/02/16 0702 09/02/16 0845    .  He was given sequential compression devices for DVT prophylaxis.  He benefited maximally from the hospital stay and there were no complications.  8  Recent vital signs:  Vitals:   09/03/16 0246 09/03/16 0519  BP: (!) 138/55 129/63  Pulse: 60 62  Resp: 18  20  Temp: 97.7 F (36.5 C) 98.2 F (36.8 C)    Recent laboratory studies:  Lab Results  Component Value Date   HGB 15.2 09/02/2016   HGB 17.7 (H) 08/13/2013   No results found for: WBC, PLT No results found for: INR No results found for: NA, K, CL, CO2, BUN, CREATININE, GLUCOSE  Discharge Medications:   Allergies as of 09/03/2016   No Known Allergies     Medication List    STOP taking these medications   finasteride 5 MG tablet Commonly known as:  PROSCAR   sulfamethoxazole-trimethoprim 800-160 MG tablet Commonly known as:  BACTRIM DS,SEPTRA DS   tamsulosin 0.4 MG Caps capsule Commonly known as:  FLOMAX     TAKE these medications   cephALEXin 500 MG capsule Commonly known as:  KEFLEX Take 1 capsule (500 mg total) by mouth 2 (two) times daily.   cholecalciferol 1000 units tablet Commonly known as:  VITAMIN D Take 1,000 Units by mouth daily.   clotrimazole 1 % cream Commonly known as:  LOTRIMIN Apply 1 application topically 2 (two) times daily as needed (for foot rash).   diphenhydramine-acetaminophen 25-500 MG Tabs tablet Commonly known as:  TYLENOL PM Take 2 tablets by mouth at bedtime.   fluticasone 50 MCG/ACT nasal spray Commonly known as:  FLONASE Place 1 spray into both nostrils daily.  latanoprost 0.005 % ophthalmic solution Commonly known as:  XALATAN Place 1 drop into both eyes at bedtime.   lovastatin 40 MG tablet Commonly known as:  MEVACOR Take 40 mg by mouth every evening.   PROBIOTIC DAILY Caps Take 1 capsule by mouth daily at 12 noon.   sertraline 25 MG tablet Commonly known as:  ZOLOFT Take 25 mg by mouth every morning.       Diagnostic Studies: No results found.  Disposition: 01-Home or Self Care    Follow-up Information    Franchot Gallo, MD Follow up.   Specialty:  Urology Why:  August 29 at 9 a.m. Contact information: [address] [place] [address] [phone]            Signed: Malka So 09/03/2016, 8:25 AM

## 2017-03-26 ENCOUNTER — Other Ambulatory Visit: Payer: Self-pay | Admitting: Urology

## 2017-03-27 ENCOUNTER — Encounter (HOSPITAL_BASED_OUTPATIENT_CLINIC_OR_DEPARTMENT_OTHER): Payer: Self-pay

## 2017-03-27 ENCOUNTER — Other Ambulatory Visit: Payer: Self-pay

## 2017-04-02 NOTE — H&P (Signed)
H&P  Chief Complaint: Gross hematuria  History of Present Illness: 84 year old male presents for cysto/bilateral retrograde pyelograms/fulguration of bleeders. He underwent TURP in July 2018 for BPH/obstruction. 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  He has had persistent gross hematuria since his TURP. CT hematuria protocol revealed no significant upper tract abnormalities. Because of his persistent bleeding he presents for the above procedures. Past Medical History:  Diagnosis Date  . BPH (benign prostatic hyperplasia)   . Cancer [facility]) 1993   Colon  . DDD (degenerative disc disease), lumbar    L4-L5  . Depression   . Glaucoma, both eyes   . Hearing loss   . Hematuria, gross   . History of adenomatous polyp of colon   . History of carpal tunnel syndrome    left  . History of colon cancer    dx 1993  s/p  hemicolectomy (malignant tumor x2)  and chemo therapy completed 1993--- no recurrence per pt  . History of shingles   . Hyperlipidemia   . Insomnia     Past Surgical History:  Procedure Laterality Date  . APPENDECTOMY    . CARPAL TUNNEL RELEASE Left 08/13/2013   Procedure: CARPAL TUNNEL RELEASE LEFT WRIST;  Surgeon: Yvette Rack., MD;  Location: [facility];  Service: Orthopedics;  Laterality: Left;  . COLONOSCOPY    . HEMICOLECTOMY  1993   malignant tumor x2 , colon cancer and Appendectomy  . INGUINAL HERNIA REPAIR Right 2003  . MASTOIDECTOMY Right 2015  . SIGMOIDOSCOPY  last one 06/ 2018  . TRANSURETHRAL RESECTION OF PROSTATE N/A 09/02/2016   Procedure: TRANSURETHRAL RESECTION OF THE PROSTATE (TURP);  Surgeon: Franchot Gallo, MD;  Location: [facility];  Service: Urology;  Laterality: N/A;    Home Medications:  Allergies as of 04/02/2017   No Known Allergies     Medication List    Notice   Cannot display discharge medications because the patient has not yet been  admitted.     Allergies: No Known Allergies  History reviewed. No pertinent family history.  Social History:  reports that  has never smoked. he has never used smokeless tobacco. He reports that he drinks about 0.6 oz of alcohol per week. He reports that he does not use drugs.  ROS: A complete review of systems was performed.  All systems are negative except for pertinent findings as noted.  Physical Exam:  Vital signs in last 24 hours:   Constitutional:  Alert and oriented, No acute distress Cardiovascular: Regular rate and rhythm, No JVD Respiratory: Normal respiratory effort, Lungs clear bilaterally GI: Abdomen is soft, nontender, nondistended, no abdominal masses Genitourinary: No CVAT. Normal male phallus, testes are descended bilaterally and non-tender and without masses, scrotum is normal in appearance without lesions or masses, perineum is normal on inspection. Lymphatic: No lymphadenopathy Neurologic: Grossly intact, no focal deficits Psychiatric: Normal mood and affect  Laboratory Data:  No results for input(s): WBC, HGB, HCT, PLT in the last 72 hours.  No results for input(s): NA, K, CL, GLUCOSE, BUN, CALCIUM, CREATININE in the last 72 hours.  Invalid input(s): CO3   No results found for this or any previous visit (from the past 24 hour(s)). No results found for this or any previous visit (from the past 240 hour(s)).  Renal Function: No results for input(s): CREATININE in the last 168 hours. CrCl cannot be calculated (No order found.).  Radiologic Imaging:  No results found.  Impression/Assessment:  Persistent gross hematuria  Plan:  Cysto, bilateral retrograde pyelograms, fulguration of bleeders

## 2017-04-03 ENCOUNTER — Ambulatory Visit (HOSPITAL_BASED_OUTPATIENT_CLINIC_OR_DEPARTMENT_OTHER)
Admission: RE | Admit: 2017-04-03 | Discharge: 2017-04-03 | Disposition: A | Discharge status: Home / Self Care | Payer: Medicare Other | Source: Ambulatory Visit | Attending: Urology | Admitting: Urology

## 2017-04-03 ENCOUNTER — Encounter (HOSPITAL_BASED_OUTPATIENT_CLINIC_OR_DEPARTMENT_OTHER): Payer: Self-pay | Admitting: *Deleted

## 2017-04-03 ENCOUNTER — Ambulatory Visit (HOSPITAL_BASED_OUTPATIENT_CLINIC_OR_DEPARTMENT_OTHER): Payer: Medicare Other | Admitting: Anesthesiology

## 2017-04-03 ENCOUNTER — Encounter (HOSPITAL_BASED_OUTPATIENT_CLINIC_OR_DEPARTMENT_OTHER)
Admission: RE | Disposition: A | Discharge status: Home / Self Care | Payer: Self-pay | Source: Ambulatory Visit | Attending: Urology

## 2017-04-03 DIAGNOSIS — R310 Gross hematuria: Secondary | ICD-10-CM | POA: Insufficient documentation

## 2017-04-03 DIAGNOSIS — Z85038 Personal history of other malignant neoplasm of large intestine: Secondary | ICD-10-CM | POA: Diagnosis not present

## 2017-04-03 DIAGNOSIS — Z9221 Personal history of antineoplastic chemotherapy: Secondary | ICD-10-CM | POA: Insufficient documentation

## 2017-04-03 DIAGNOSIS — Z9049 Acquired absence of other specified parts of digestive tract: Secondary | ICD-10-CM | POA: Insufficient documentation

## 2017-04-03 HISTORY — DX: Other intervertebral disc degeneration, lumbar region: M51.36

## 2017-04-03 HISTORY — DX: Personal history of other diseases of the nervous system and sense organs: Z86.69

## 2017-04-03 HISTORY — DX: Other intervertebral disc degeneration, lumbar region without mention of lumbar back pain or lower extremity pain: M51.369

## 2017-04-03 HISTORY — PX: CYSTOSCOPY W/ RETROGRADES: SHX1426

## 2017-04-03 HISTORY — PX: CYSTOSCOPY WITH FULGERATION: SHX6638

## 2017-04-03 HISTORY — DX: Depression, unspecified: F32.A

## 2017-04-03 HISTORY — DX: Unspecified hearing loss, unspecified ear: H91.90

## 2017-04-03 HISTORY — DX: Major depressive disorder, single episode, unspecified: F32.9

## 2017-04-03 SURGERY — CYSTOSCOPY, WITH RETROGRADE PYELOGRAM
Anesthesia: General | Site: Renal | Laterality: Left

## 2017-04-03 MED ORDER — STERILE WATER FOR IRRIGATION IR SOLN
Status: DC | PRN
  Administered 2017-04-03: 3000 mL

## 2017-04-03 MED ORDER — CEFAZOLIN SODIUM-DEXTROSE 2-4 GM/100ML-% IV SOLN
2.0000 g | INTRAVENOUS | Status: AC
  Administered 2017-04-03: 2 g via INTRAVENOUS
  Filled 2017-04-03: qty 100

## 2017-04-03 MED ORDER — FENTANYL CITRATE (PF) 100 MCG/2ML IJ SOLN
INTRAMUSCULAR | Status: AC
  Filled 2017-04-03: qty 2

## 2017-04-03 MED ORDER — WHITE PETROLATUM EX OINT
TOPICAL_OINTMENT | CUTANEOUS | Status: AC
  Filled 2017-04-03: qty 5

## 2017-04-03 MED ORDER — CEFAZOLIN SODIUM-DEXTROSE 1-4 GM/50ML-% IV SOLN
INTRAVENOUS | Status: AC
  Filled 2017-04-03: qty 50

## 2017-04-03 MED ORDER — PROPOFOL 10 MG/ML IV BOLUS
INTRAVENOUS | Status: DC | PRN
  Administered 2017-04-03: 150 mg via INTRAVENOUS
  Administered 2017-04-03: 50 mg via INTRAVENOUS

## 2017-04-03 MED ORDER — FENTANYL CITRATE (PF) 100 MCG/2ML IJ SOLN
25.0000 ug | INTRAMUSCULAR | Status: DC | PRN
  Filled 2017-04-03: qty 1

## 2017-04-03 MED ORDER — LACTATED RINGERS IV SOLN
INTRAVENOUS | Status: DC
  Administered 2017-04-03 (×2): via INTRAVENOUS
  Filled 2017-04-03: qty 1000

## 2017-04-03 MED ORDER — ONDANSETRON HCL 4 MG/2ML IJ SOLN
INTRAMUSCULAR | Status: AC
  Filled 2017-04-03: qty 2

## 2017-04-03 MED ORDER — LIDOCAINE 2% (20 MG/ML) 5 ML SYRINGE
INTRAMUSCULAR | Status: AC
  Filled 2017-04-03: qty 5

## 2017-04-03 MED ORDER — FENTANYL CITRATE (PF) 100 MCG/2ML IJ SOLN
INTRAMUSCULAR | Status: DC | PRN
  Administered 2017-04-03 (×2): 25 ug via INTRAVENOUS

## 2017-04-03 MED ORDER — SODIUM CHLORIDE 0.9 % IR SOLN
Status: DC | PRN
  Administered 2017-04-03: 3000 mL

## 2017-04-03 MED ORDER — DEXAMETHASONE SODIUM PHOSPHATE 10 MG/ML IJ SOLN
INTRAMUSCULAR | Status: DC | PRN
  Administered 2017-04-03: 10 mg via INTRAVENOUS

## 2017-04-03 MED ORDER — ONDANSETRON HCL 4 MG/2ML IJ SOLN
INTRAMUSCULAR | Status: DC | PRN
  Administered 2017-04-03: 4 mg via INTRAVENOUS

## 2017-04-03 MED ORDER — ONDANSETRON HCL 4 MG/2ML IJ SOLN
4.0000 mg | Freq: Once | INTRAMUSCULAR | Status: DC | PRN
  Filled 2017-04-03: qty 2

## 2017-04-03 MED ORDER — PROPOFOL 10 MG/ML IV BOLUS
INTRAVENOUS | Status: AC
  Filled 2017-04-03: qty 20

## 2017-04-03 MED ORDER — CEFAZOLIN SODIUM-DEXTROSE 2-4 GM/100ML-% IV SOLN
INTRAVENOUS | Status: AC
  Filled 2017-04-03: qty 100

## 2017-04-03 MED ORDER — CEPHALEXIN 500 MG PO CAPS: 500.0000 mg | ORAL_CAPSULE | Freq: Two times a day (BID) | ORAL | 0 refills | Status: DC

## 2017-04-03 MED ORDER — LIDOCAINE 2% (20 MG/ML) 5 ML SYRINGE
INTRAMUSCULAR | Status: DC | PRN
  Administered 2017-04-03: 100 mg via INTRAVENOUS

## 2017-04-03 MED ORDER — EPHEDRINE SULFATE-NACL 50-0.9 MG/10ML-% IV SOSY
PREFILLED_SYRINGE | INTRAVENOUS | Status: DC | PRN
  Administered 2017-04-03 (×2): 10 mg via INTRAVENOUS

## 2017-04-03 MED ORDER — IOHEXOL 300 MG/ML  SOLN
INTRAMUSCULAR | Status: DC | PRN
  Administered 2017-04-03: 10 mL

## 2017-04-03 MED ORDER — EPHEDRINE 5 MG/ML INJ
INTRAVENOUS | Status: AC
  Filled 2017-04-03: qty 10

## 2017-04-03 MED ORDER — DEXAMETHASONE SODIUM PHOSPHATE 10 MG/ML IJ SOLN
INTRAMUSCULAR | Status: AC
  Filled 2017-04-03: qty 1

## 2017-04-03 SURGICAL SUPPLY — 30 items
BAG DRAIN URO-CYSTO SKYTR STRL (DRAIN) ×4 IMPLANT
BASKET ZERO TIP NITINOL 2.4FR (BASKET) ×4 IMPLANT
CATH INTERMIT  6FR 70CM (CATHETERS) ×4 IMPLANT
CATH URET 5FR 28IN CONE TIP (BALLOONS)
CATH URET 5FR 70CM CONE TIP (BALLOONS) IMPLANT
CLOTH BEACON ORANGE TIMEOUT ST (SAFETY) ×4 IMPLANT
ELECT REM PT RETURN 9FT ADLT (ELECTROSURGICAL)
ELECTRODE REM PT RTRN 9FT ADLT (ELECTROSURGICAL) IMPLANT
FIBER LASER FLEXIVA 365 (UROLOGICAL SUPPLIES) IMPLANT
FIBER LASER TRAC TIP (UROLOGICAL SUPPLIES) IMPLANT
GLOVE BIO SURGEON STRL SZ8 (GLOVE) ×4 IMPLANT
GLOVE BIOGEL PI IND STRL 7.0 (GLOVE) ×2 IMPLANT
GLOVE BIOGEL PI INDICATOR 7.0 (GLOVE) ×2
GLOVE ECLIPSE 7.0 STRL STRAW (GLOVE) ×4 IMPLANT
GOWN STRL REUS W/ TWL XL LVL3 (GOWN DISPOSABLE) ×4 IMPLANT
GOWN STRL REUS W/TWL XL LVL3 (GOWN DISPOSABLE) ×4
GUIDEWIRE ANG ZIPWIRE 038X150 (WIRE) IMPLANT
GUIDEWIRE STR DUAL SENSOR (WIRE) IMPLANT
INFUSOR MANOMETER BAG 3000ML (MISCELLANEOUS) IMPLANT
IV NS IRRIG 3000ML ARTHROMATIC (IV SOLUTION) ×4 IMPLANT
KIT RM TURNOVER CYSTO AR (KITS) ×4 IMPLANT
MANIFOLD NEPTUNE II (INSTRUMENTS) ×4 IMPLANT
NS IRRIG 500ML POUR BTL (IV SOLUTION) ×4 IMPLANT
PACK CYSTO (CUSTOM PROCEDURE TRAY) ×4 IMPLANT
SHEATH ACCESS URETERAL 24CM (SHEATH) ×4 IMPLANT
SHEATH ACCESS URETERAL 38CM (SHEATH) IMPLANT
STENT URET 6FRX26 CONTOUR (STENTS) ×4 IMPLANT
TUBE CONNECTING 12'X1/4 (SUCTIONS)
TUBE CONNECTING 12X1/4 (SUCTIONS) IMPLANT
WATER STERILE IRR 3000ML UROMA (IV SOLUTION) ×4 IMPLANT

## 2017-04-03 NOTE — Discharge Instructions (Signed)
1. You may see some blood in the urine and may have some burning with urination for 48-72 hours. You also may notice that you have to urinate more frequently or urgently after your procedure which is normal.  2. You should call should you develop an inability urinate, fever > 101, persistent nausea and vomiting that prevents you from eating or drinking to stay hydrated.  3. If you have a stent, you will likely urinate more frequently and urgently until the stent is removed and you may experience some discomfort/pain in the lower abdomen and flank especially when urinating. You may take pain medication prescribed to you if needed for pain. You may also intermittently have blood in the urine until the stent is removed. 4. If you have a catheter, you will be taught how to take care of the catheter by the nursing staff prior to discharge from the hospital.  You may periodically feel a strong urge to void with the catheter in place.  This is a bladder spasm and most often can occur when having a bowel movement or moving around. It is typically self-limited and usually will stop after a few minutes.  You may use some Vaseline or Neosporin around the tip of the catheter to reduce friction at the tip of the penis. You may also see some blood in the urine.  A very small amount of blood can make the urine look quite red.  As long as the catheter is draining well, there usually is not a problem.  However, if the catheter is not draining well and is bloody, you should call the office [phone]) to notify us.  CYSTOSCOPY HOME CARE INSTRUCTIONS  Activity: Rest for the remainder of the day.  Do not drive or operate equipment today.  You may resume normal activities in one to two days as instructed by your physician.   Meals: Drink plenty of liquids and eat light foods such as gelatin or soup this evening.  You may return to a normal meal plan tomorrow.  Return to Work: You may return to work in one to two days or  as instructed by your physician.  Special Instructions / Symptoms: Call your physician if any of these symptoms occur:   -persistent or heavy bleeding  -bleeding which continues              blood clots that are difficult to pass  -urine stream diminishes or stops completely  -fever equal to or higher than 101 degrees Farenheit.  -cloudy urine with a strong, foul odor  -severe pain  Females should always wipe from front to back after elimination.  You may feel some burning pain when you urinate.  This should disappear with time.  Applying moist heat to the lower abdomen or a hot tub bath may help relieve the pain. \  Post Anesthesia Home Care Instructions  Activity: Get plenty of rest for the remainder of the day. A responsible individual must stay with you for 24 hours following the procedure.  For the next 24 hours, DO NOT: -Drive a car -Paediatric nurse -Drink alcoholic beverages -Take any medication unless instructed by your physician -Make any legal decisions or sign important papers.  Meals: Start with liquid foods such as gelatin or soup. Progress to regular foods as tolerated. Avoid greasy, spicy, heavy foods. If nausea and/or vomiting occur, drink only clear liquids until the nausea and/or vomiting subsides. Call your physician if vomiting continues.  Special Instructions/Symptoms: Your throat may feel  dry or sore from the anesthesia or the breathing tube placed in your throat during surgery. If this causes discomfort, gargle with warm salt water. The discomfort should disappear within 24 hours.  If you had a scopolamine patch placed behind your ear for the management of post- operative nausea and/or vomiting:  1. The medication in the patch is effective for 72 hours, after which it should be removed.  Wrap patch in a tissue and discard in the trash. Wash hands thoroughly with soap and water. 2. You may remove the patch earlier than 72 hours if you experience unpleasant  side effects which may include dry mouth, dizziness or visual disturbances. 3. Avoid touching the patch. Wash your hands with soap and water after contact with the patch.

## 2017-04-03 NOTE — Op Note (Signed)
Preoperative diagnosis: Gross hematuria, persistent, question prostatic source  Postoperative diagnosis: Normal bladder/resected prostatic urethra, left distal ureteral tumor  Principal procedure: Cystoscopy, bilateral retrograde ureteral pyelograms, left ureteroscopy, biopsy of left ureteral tumor, placement of 6 French by 26 cm left contour double-J stent, fluoroscopic interpretation  Surgeon: Janelli Welling  Anesthesia: General with LMA  Complications: None  Specimen: Left ureteral biopsies  Estimated blood loss: Less than 10 cc  Drains: 6 French by 26 cm contour double-J stent and left kidney  Findings: Well resected prostatic fossa without lesions or blood.  Mildly trabeculated bladder without urothelial lesions.  Normal ureteral orifice ease.  Retrograde pyelogram on the right revealed a normal ureter throughout without evidence of filling defect, hydronephrosis or pyelocalyceal abnormalities.  Retrograde pyelogram on the left revealed a filling defect in the left distal ureter for approximately 2 cm with minimal passage of contrast beyond/proximal in the ureter.  Consistent with ureteral tumor.  Ureteroscopic evaluation revealed a papillary tumor for approximately 2 cm in the area of the mid/distal sacral ureter.  Proximally, normal urothelium with and normal pyelocalyceal system.  Indications: 84 year old male status post TUR P in the summer 2018.  He has had persistent gross painless hematuria.  CT hematuria protocol revealed normal upper tracts.  Cystoscopy in the office revealed minimal telangiectatic vessels in the prostatic urethra without urothelial lesions in the bladder.  Because the patient has had persistent gross hematuria, he presents at this time for cystoscopy, retrogrades and possible fulguration of prostatic urethra.  The procedure as well as risks and complications were discussed with the patient.  He understands these and desires to proceed.  Description of procedure: The  patient was properly identified in the holding area.  He was taken to the operating room where general anesthetic was administered with the LMA.  He was placed in the dorsolithotomy position.  Genitalia and perineum were prepped and draped.  Proper timeout was performed.  A 22 French panendoscope was advanced without difficulty into the bladder.  Prostatic fossa was well resected without significant regrowth.  No lesions or bleeding were seen in this area.  The bladder was inspected circumferentially.  There were mild trabeculations.  No tumors or foreign bodies were noted.  Using Omnipaque, bilateral retrograde ureteral pyelograms were performed.  Above-mentioned findings noted.  Following retrograde pyelogram on the left, I negotiated a 0.038 inch sensor tip guidewire proximally in the ureter.  Good curl was seen in the upper pole calyceal system.  The cystoscope and open-ended catheter were then removed.  A 6 French dual-lumen semirigid ureteroscope was then advanced into the bladder and then up into the ureter where the papillary lesions were seen in the area of the abnormality on the retrograde ureteropyelogram.  I used the 0 tip nitinol basket to break loose several small pieces of this papillary tumor and this was sent for permanent specimen in saline labeled ureteral tumor.  I could not easily passed the semirigid scope very far over the pelvic ureter.  It was then removed.  I negotiated a 12/14 ureteral access catheter over top of the guidewire.  The core and the guidewire were removed.  I then passed the flexible ureteroscope proximally in the ureter.  It passed quite easily into the pyelocalyceal system which was then inspected carefully.  No urothelial lesions were seen either in the pyelocalyceal system or in the ureter down to the sacral ureter.  Once the entire ureter had been inspected, I replaced the guidewire through the ureteral access catheter,  having taken the ureteroscope out.  The ureteral  access catheter was then removed.  The guidewire was backloaded through the cystoscope, and a 6 [place] by 26 cm contour double-J stent with the thread removed it was deployed in the ureter with excellent proximal and distal curl seen fluoroscopically/cystoscopically.  At this point the bladder was drained and the scope removed.  The patient tolerated the procedure well.  He was awakened and taken to the PACU in stable condition.

## 2017-04-03 NOTE — Anesthesia Procedure Notes (Signed)
Procedure Name: LMA Insertion Date/Time: 04/03/2017 11:10 AM Performed by: Bonney Aid, CRNA Pre-anesthesia Checklist: Patient identified, Emergency Drugs available, Suction available and Patient being monitored Patient Re-evaluated:Patient Re-evaluated prior to induction Oxygen Delivery Method: Circle system utilized Preoxygenation: Pre-oxygenation with 100% oxygen Induction Type: IV induction Ventilation: Mask ventilation without difficulty LMA: LMA inserted LMA Size: 5.0 Number of attempts: 1 Airway Equipment and Method: Bite block Placement Confirmation: positive ETCO2 Tube secured with: Tape Dental Injury: Teeth and Oropharynx as per pre-operative assessment

## 2017-04-03 NOTE — Interval H&P Note (Signed)
History and Physical Interval Note:  04/03/2017 10:45 AM  Jonathan Marshall  has presented today for surgery, with the diagnosis of HEMATURIA  The various methods of treatment have been discussed with the patient and family. After consideration of risks, benefits and other options for treatment, the patient has consented to  Procedure(s) with comments: [place] (Bilateral) - [address] [place] (N/A) as a surgical intervention .  The patient's history has been reviewed, patient examined, no change in status, stable for surgery.  I have reviewed the patient's chart and labs.  Questions were answered to the patient's satisfaction.     Lillette Boxer Jonathan Marshall

## 2017-04-03 NOTE — Anesthesia Preprocedure Evaluation (Signed)
Anesthesia Evaluation  Patient identified by MRN, date of birth, ID band Patient awake    Reviewed: Allergy & Precautions, NPO status , Patient's Chart, lab work & pertinent test results  Airway Mallampati: II  TM Distance: >3 FB Neck ROM: Full    Dental  (+) Dental Advisory Given   Pulmonary neg pulmonary ROS,    breath sounds clear to auscultation       Cardiovascular negative cardio ROS   Rhythm:Regular Rate:Normal     Neuro/Psych negative neurological ROS     GI/Hepatic Neg liver ROS, Colon ca s/p hemicolectomy   Endo/Other  negative endocrine ROS  Renal/GU hematuria     Musculoskeletal  (+) Arthritis ,   Abdominal   Peds  Hematology negative hematology ROS (+)   Anesthesia Other Findings   Reproductive/Obstetrics                             Anesthesia Physical Anesthesia Plan  ASA: II  Anesthesia Plan: General   Post-op Pain Management:    Induction: Intravenous  PONV Risk Score and Plan: 2 and Ondansetron, Dexamethasone and Treatment may vary due to age or medical condition  Airway Management Planned: LMA  Additional Equipment:   Intra-op Plan:   Post-operative Plan: Extubation in OR  Informed Consent: I have reviewed the patients History and Physical, chart, labs and discussed the procedure including the risks, benefits and alternatives for the proposed anesthesia with the patient or authorized representative who has indicated his/her understanding and acceptance.   Dental advisory given  Plan Discussed with: CRNA  Anesthesia Plan Comments:         Anesthesia Quick Evaluation

## 2017-04-03 NOTE — Transfer of Care (Signed)
Immediate Anesthesia Transfer of Care Note Last Vitals:  Vitals:   04/03/17 0949  BP: 130/74  Pulse: (!) 57  Resp: 16  Temp: 36.6 C  SpO2: 96%    Last Pain:  Vitals:   04/03/17 0949  TempSrc: Oral      Patients Stated Pain Goal: 2 (04/03/17 1026) Immediate Anesthesia Transfer of Care Note  Patient: Kelton Pillar  Procedure(s) Performed: Procedure(s) (LRB): CYSTOSCOPY WITH RETROGRADE PYELOGRAM, LEFT URETEROSCOPY, LEFT STENT PLACEMENT (Bilateral) LEFT URETERAL BIOPSY (Left)  Patient Location: PACU  Anesthesia Type: General  Level of Consciousness: awake, alert  and oriented  Airway & Oxygen Therapy: Patient Spontanous Breathing and Patient connected to face mask oxygen  Post-op Assessment: Report given to PACU RN and Post -op Vital signs reviewed and stable  Post vital signs: Reviewed and stable  Complications: No apparent anesthesia complications

## 2017-04-04 ENCOUNTER — Encounter (HOSPITAL_BASED_OUTPATIENT_CLINIC_OR_DEPARTMENT_OTHER): Payer: Self-pay | Admitting: Urology

## 2017-04-04 NOTE — Anesthesia Postprocedure Evaluation (Signed)
Anesthesia Post Note  Patient: IVAL PACER  Procedure(s) Performed: CYSTOSCOPY WITH RETROGRADE PYELOGRAM, LEFT URETEROSCOPY, LEFT STENT PLACEMENT (Bilateral Renal) LEFT URETERAL BIOPSY (Left Renal)     Patient location during evaluation: PACU Anesthesia Type: General Level of consciousness: awake and alert Pain management: pain level controlled Vital Signs Assessment: post-procedure vital signs reviewed and stable Respiratory status: spontaneous breathing, nonlabored ventilation, respiratory function stable and patient connected to nasal cannula oxygen Cardiovascular status: blood pressure returned to baseline and stable Postop Assessment: no apparent nausea or vomiting Anesthetic complications: no    Last Vitals:  Vitals:   04/03/17 1315 04/03/17 1352  BP: (!) 148/84 (!) 144/66  Pulse: (!) 58 61  Resp: 18 16  Temp:  36.6 C  SpO2: 99% 99%    Last Pain:  Vitals:   04/03/17 0949  TempSrc: Oral   Pain Goal: Patients Stated Pain Goal: 2 (04/03/17 1026)               Tiajuana Amass

## 2017-04-16 ENCOUNTER — Other Ambulatory Visit: Payer: Self-pay | Admitting: Urology

## 2017-04-16 ENCOUNTER — Other Ambulatory Visit: Payer: Self-pay

## 2017-04-16 ENCOUNTER — Encounter (HOSPITAL_BASED_OUTPATIENT_CLINIC_OR_DEPARTMENT_OTHER): Payer: Self-pay

## 2017-04-16 NOTE — Progress Notes (Signed)
Spoke with:  Watt NPO:  After Midnight, no gum, candy, or mints   Arrival time: 0930 AM Labs: Hemoglobin AM medications: Sertraline Pre op orders: No Ride home:  Baldo Ash (wife) [phone]

## 2017-04-23 ENCOUNTER — Ambulatory Visit (HOSPITAL_BASED_OUTPATIENT_CLINIC_OR_DEPARTMENT_OTHER): Payer: Medicare Other | Admitting: Anesthesiology

## 2017-04-23 ENCOUNTER — Encounter (HOSPITAL_BASED_OUTPATIENT_CLINIC_OR_DEPARTMENT_OTHER): Payer: Self-pay | Admitting: *Deleted

## 2017-04-23 ENCOUNTER — Ambulatory Visit (HOSPITAL_BASED_OUTPATIENT_CLINIC_OR_DEPARTMENT_OTHER)
Admission: RE | Admit: 2017-04-23 | Discharge: 2017-04-23 | Disposition: A | Discharge status: Home / Self Care | Payer: Medicare Other | Source: Ambulatory Visit | Attending: Urology | Admitting: Urology

## 2017-04-23 ENCOUNTER — Encounter (HOSPITAL_BASED_OUTPATIENT_CLINIC_OR_DEPARTMENT_OTHER)
Admission: RE | Disposition: A | Discharge status: Home / Self Care | Payer: Self-pay | Source: Ambulatory Visit | Attending: Urology

## 2017-04-23 ENCOUNTER — Other Ambulatory Visit: Payer: Self-pay

## 2017-04-23 DIAGNOSIS — Z8546 Personal history of malignant neoplasm of prostate: Secondary | ICD-10-CM | POA: Diagnosis not present

## 2017-04-23 DIAGNOSIS — Z9221 Personal history of antineoplastic chemotherapy: Secondary | ICD-10-CM | POA: Diagnosis not present

## 2017-04-23 DIAGNOSIS — Z9049 Acquired absence of other specified parts of digestive tract: Secondary | ICD-10-CM | POA: Diagnosis not present

## 2017-04-23 DIAGNOSIS — Z85038 Personal history of other malignant neoplasm of large intestine: Secondary | ICD-10-CM | POA: Diagnosis not present

## 2017-04-23 DIAGNOSIS — C662 Malignant neoplasm of left ureter: Secondary | ICD-10-CM | POA: Diagnosis present

## 2017-04-23 DIAGNOSIS — Z6831 Body mass index (BMI) 31.0-31.9, adult: Secondary | ICD-10-CM | POA: Insufficient documentation

## 2017-04-23 DIAGNOSIS — Z980 Intestinal bypass and anastomosis status: Secondary | ICD-10-CM | POA: Insufficient documentation

## 2017-04-23 DIAGNOSIS — E669 Obesity, unspecified: Secondary | ICD-10-CM | POA: Diagnosis not present

## 2017-04-23 HISTORY — PX: CYSTOSCOPY W/ URETERAL STENT PLACEMENT: SHX1429

## 2017-04-23 HISTORY — PX: CYSTOSCOPY/RETROGRADE/URETEROSCOPY: SHX5316

## 2017-04-23 LAB — POCT I-STAT, CHEM 8
BUN: 14 mg/dL (ref 6–20)
CREATININE: 1.3 mg/dL — AB (ref 0.61–1.24)
Calcium, Ion: 1.33 mmol/L (ref 1.15–1.40)
Chloride: 111 mmol/L (ref 101–111)
Glucose, Bld: 106 mg/dL — ABNORMAL HIGH (ref 65–99)
HEMATOCRIT: 41 % (ref 39.0–52.0)
Hemoglobin: 13.9 g/dL (ref 13.0–17.0)
POTASSIUM: 3.9 mmol/L (ref 3.5–5.1)
SODIUM: 143 mmol/L (ref 135–145)
TCO2: 20 mmol/L — AB (ref 22–32)

## 2017-04-23 SURGERY — CYSTOSCOPY/RETROGRADE/URETEROSCOPY
Anesthesia: General | Site: Ureter | Laterality: Left

## 2017-04-23 MED ORDER — SODIUM CHLORIDE 0.9 % IR SOLN
Status: DC | PRN
  Administered 2017-04-23: 4000 mL

## 2017-04-23 MED ORDER — DEXTROSE 5 % IV SOLN
5.0000 mg/kg | INTRAVENOUS | Status: AC
  Administered 2017-04-23: 490 mg via INTRAVENOUS
  Filled 2017-04-23 (×2): qty 12.25

## 2017-04-23 MED ORDER — ONDANSETRON HCL 4 MG/2ML IJ SOLN
4.0000 mg | Freq: Once | INTRAMUSCULAR | Status: DC | PRN
  Filled 2017-04-23: qty 2

## 2017-04-23 MED ORDER — FENTANYL CITRATE (PF) 100 MCG/2ML IJ SOLN
INTRAMUSCULAR | Status: AC
  Filled 2017-04-23: qty 2

## 2017-04-23 MED ORDER — IOHEXOL 300 MG/ML  SOLN
INTRAMUSCULAR | Status: DC | PRN
  Administered 2017-04-23: 10 mL
  Administered 2017-04-23: 13 mL

## 2017-04-23 MED ORDER — 0.9 % SODIUM CHLORIDE (POUR BTL) OPTIME
TOPICAL | Status: DC | PRN
  Administered 2017-04-23: 500 mL

## 2017-04-23 MED ORDER — ONDANSETRON HCL 4 MG/2ML IJ SOLN
INTRAMUSCULAR | Status: AC
  Filled 2017-04-23: qty 2

## 2017-04-23 MED ORDER — EPHEDRINE SULFATE-NACL 50-0.9 MG/10ML-% IV SOSY
PREFILLED_SYRINGE | INTRAVENOUS | Status: DC | PRN
  Administered 2017-04-23 (×2): 10 mg via INTRAVENOUS

## 2017-04-23 MED ORDER — ONDANSETRON HCL 4 MG/2ML IJ SOLN
INTRAMUSCULAR | Status: DC | PRN
  Administered 2017-04-23: 4 mg via INTRAVENOUS

## 2017-04-23 MED ORDER — FENTANYL CITRATE (PF) 100 MCG/2ML IJ SOLN
25.0000 ug | INTRAMUSCULAR | Status: DC | PRN
  Filled 2017-04-23: qty 1

## 2017-04-23 MED ORDER — LIDOCAINE 2% (20 MG/ML) 5 ML SYRINGE
INTRAMUSCULAR | Status: DC | PRN
  Administered 2017-04-23: 80 mg via INTRAVENOUS

## 2017-04-23 MED ORDER — FENTANYL CITRATE (PF) 100 MCG/2ML IJ SOLN
INTRAMUSCULAR | Status: DC | PRN
  Administered 2017-04-23 (×4): 25 ug via INTRAVENOUS

## 2017-04-23 MED ORDER — LIDOCAINE 2% (20 MG/ML) 5 ML SYRINGE
INTRAMUSCULAR | Status: AC
  Filled 2017-04-23: qty 5

## 2017-04-23 MED ORDER — PROPOFOL 10 MG/ML IV BOLUS
INTRAVENOUS | Status: DC | PRN
  Administered 2017-04-23: 180 mg via INTRAVENOUS

## 2017-04-23 MED ORDER — DEXAMETHASONE SODIUM PHOSPHATE 10 MG/ML IJ SOLN
INTRAMUSCULAR | Status: DC | PRN
  Administered 2017-04-23: 10 mg via INTRAVENOUS

## 2017-04-23 MED ORDER — DEXAMETHASONE SODIUM PHOSPHATE 10 MG/ML IJ SOLN
INTRAMUSCULAR | Status: AC
  Filled 2017-04-23: qty 1

## 2017-04-23 MED ORDER — EPHEDRINE 5 MG/ML INJ
INTRAVENOUS | Status: AC
  Filled 2017-04-23: qty 10

## 2017-04-23 MED ORDER — LACTATED RINGERS IV SOLN
INTRAVENOUS | Status: DC
  Administered 2017-04-23 (×2): via INTRAVENOUS
  Filled 2017-04-23: qty 1000

## 2017-04-23 MED ORDER — PROPOFOL 10 MG/ML IV BOLUS
INTRAVENOUS | Status: AC
  Filled 2017-04-23: qty 20

## 2017-04-23 MED ORDER — TRAMADOL HCL 50 MG PO TABS: 50.0000 mg | ORAL_TABLET | Freq: Four times a day (QID) | ORAL | 0 refills | Status: DC | PRN

## 2017-04-23 SURGICAL SUPPLY — 23 items
BAG DRAIN URO-CYSTO SKYTR STRL (DRAIN) ×4 IMPLANT
BASKET LASER NITINOL 1.9FR (BASKET) IMPLANT
CATH INTERMIT  6FR 70CM (CATHETERS) IMPLANT
CLOTH BEACON ORANGE TIMEOUT ST (SAFETY) ×4 IMPLANT
FIBER LASER FLEXIVA 365 (UROLOGICAL SUPPLIES) IMPLANT
FIBER LASER TRAC TIP (UROLOGICAL SUPPLIES) IMPLANT
GLOVE BIO SURGEON STRL SZ7.5 (GLOVE) ×4 IMPLANT
GOWN STRL REUS W/TWL LRG LVL3 (GOWN DISPOSABLE) ×4 IMPLANT
GUIDEWIRE ANG ZIPWIRE 038X150 (WIRE) ×4 IMPLANT
GUIDEWIRE STR DUAL SENSOR (WIRE) ×4 IMPLANT
INFUSOR MANOMETER BAG 3000ML (MISCELLANEOUS) ×4 IMPLANT
IV NS 1000ML (IV SOLUTION) ×2
IV NS 1000ML BAXH (IV SOLUTION) ×2 IMPLANT
IV NS IRRIG 3000ML ARTHROMATIC (IV SOLUTION) ×4 IMPLANT
KIT TURNOVER CYSTO (KITS) ×4 IMPLANT
MANIFOLD NEPTUNE II (INSTRUMENTS) ×4 IMPLANT
NS IRRIG 500ML POUR BTL (IV SOLUTION) ×8 IMPLANT
PACK CYSTO (CUSTOM PROCEDURE TRAY) ×4 IMPLANT
STENT URET 6FRX26 CONTOUR (STENTS) ×4 IMPLANT
SYRINGE 10CC LL (SYRINGE) ×4 IMPLANT
TUBE CONNECTING 12'X1/4 (SUCTIONS)
TUBE CONNECTING 12X1/4 (SUCTIONS) IMPLANT
TUBE FEEDING 8FR 16IN STR KANG (MISCELLANEOUS) IMPLANT

## 2017-04-23 NOTE — Anesthesia Preprocedure Evaluation (Addendum)
Anesthesia Evaluation  Patient identified by MRN, date of birth, ID band Patient awake    Reviewed: Allergy & Precautions, NPO status , Patient's Chart, lab work & pertinent test results  Airway Mallampati: II  TM Distance: >3 FB Neck ROM: Full    Dental  (+) Dental Advisory Given, Teeth Intact   Pulmonary neg pulmonary ROS,    breath sounds clear to auscultation       Cardiovascular Exercise Tolerance: Good negative cardio ROS   Rhythm:Regular Rate:Normal     Neuro/Psych PSYCHIATRIC DISORDERS Depression negative neurological ROS     GI/Hepatic Neg liver ROS, Colon ca s/p hemicolectomy and chemo   Endo/Other  Obesity   Renal/GU Hematuria LEFT URETERAL CANCER     Musculoskeletal  (+) Arthritis , Osteoarthritis,    Abdominal   Peds  Hematology negative hematology ROS (+)   Anesthesia Other Findings   Reproductive/Obstetrics                             Anesthesia Physical  Anesthesia Plan  ASA: III  Anesthesia Plan: General   Post-op Pain Management:    Induction: Intravenous  PONV Risk Score and Plan: 3 and Ondansetron, Dexamethasone and Treatment may vary due to age or medical condition  Airway Management Planned: LMA  Additional Equipment:   Intra-op Plan:   Post-operative Plan: Extubation in OR  Informed Consent: I have reviewed the patients History and Physical, chart, labs and discussed the procedure including the risks, benefits and alternatives for the proposed anesthesia with the patient or authorized representative who has indicated his/her understanding and acceptance.   Dental advisory given  Plan Discussed with: CRNA  Anesthesia Plan Comments:         Anesthesia Quick Evaluation

## 2017-04-23 NOTE — Transfer of Care (Signed)
Immediate Anesthesia Transfer of Care Note  Patient: DANTRELL SCHERTZER  Procedure(s) Performed: Procedure(s) (LRB): CYSTOSCOPY/RETROGRADE/URETEROSCOPY, LEFT  URETEROSCOPY WITY  BIOPSY (Left) CYSTOSCOPY WITH STENT REPLACEMENT (Left)  Patient Location: PACU  Anesthesia Type: General  Level of Consciousness: awake, sedated, patient cooperative and responds to stimulation  Airway & Oxygen Therapy: Patient Spontanous Breathing and Patient connected to face mask oxygen  Post-op Assessment: Report given to PACU RN, Post -op Vital signs reviewed and stable and Patient moving all extremities  Post vital signs: Reviewed and stable  Complications: No apparent anesthesia complications

## 2017-04-23 NOTE — Anesthesia Procedure Notes (Signed)
Procedure Name: LMA Insertion Date/Time: 04/23/2017 12:54 PM Performed by: Genelle Bal, CRNA Pre-anesthesia Checklist: Patient identified, Emergency Drugs available, Suction available and Patient being monitored Patient Re-evaluated:Patient Re-evaluated prior to induction Oxygen Delivery Method: Circle system utilized Preoxygenation: Pre-oxygenation with 100% oxygen Induction Type: IV induction Ventilation: Mask ventilation without difficulty LMA: LMA inserted LMA Size: 4.0 Number of attempts: 1 Airway Equipment and Method: Bite block Placement Confirmation: positive ETCO2 Tube secured with: Tape Dental Injury: Teeth and Oropharynx as per pre-operative assessment

## 2017-04-23 NOTE — Discharge Instructions (Signed)
°  Post Anesthesia Home Care Instructions  Activity: Get plenty of rest for the remainder of the day. A responsible individual must stay with you for 24 hours following the procedure.  For the next 24 hours, DO NOT: -Drive a car -Operate machinery -Drink alcoholic beverages -Take any medication unless instructed by your physician -Make any legal decisions or sign important papers.  Meals: Start with liquid foods such as gelatin or soup. Progress to regular foods as tolerated. Avoid greasy, spicy, heavy foods. If nausea and/or vomiting occur, drink only clear liquids until the nausea and/or vomiting subsides. Call your physician if vomiting continues.  Special Instructions/Symptoms: Your throat may feel dry or sore from the anesthesia or the breathing tube placed in your throat during surgery. If this causes discomfort, gargle with warm salt water. The discomfort should disappear within 24 hours.  If you had a scopolamine patch placed behind your ear for the management of post- operative nausea and/or vomiting:  1. The medication in the patch is effective for 72 hours, after which it should be removed.  Wrap patch in a tissue and discard in the trash. Wash hands thoroughly with soap and water. 2. You may remove the patch earlier than 72 hours if you experience unpleasant side effects which may include dry mouth, dizziness or visual disturbances. 3. Avoid touching the patch. Wash your hands with soap and water after contact with the patch.    1 - You may have urinary urgency (bladder spasms) and bloody urine on / off with stent in place. This is normal.  2 - Call MD or go to ER for fever >102, severe pain / nausea / vomiting not relieved by medications, or acute change in medical status  

## 2017-04-23 NOTE — Brief Op Note (Signed)
04/23/2017  1:36 PM  PATIENT:  Jonathan Marshall  84 y.o. male  PRE-OPERATIVE DIAGNOSIS:  LEFT URETERAL CANCER  POST-OPERATIVE DIAGNOSIS:  LEFT URETERAL CANCER  PROCEDURE:  Procedure(s): CYSTOSCOPY/RETROGRADE/URETEROSCOPY, LEFT  URETEROSCOPY WITY  BIOPSY (Left) CYSTOSCOPY WITH STENT REPLACEMENT (Left)  SURGEON:  Surgeon(s) and Role:    * Alexis Frock, MD - Primary  PHYSICIAN ASSISTANT:   ASSISTANTS: none   ANESTHESIA:   general  EBL:  minimal   BLOOD ADMINISTERED:none  DRAINS: none   LOCAL MEDICATIONS USED:  NONE  SPECIMEN:  Source of Specimen:  LEFT ureteral cancer  DISPOSITION OF SPECIMEN:  PATHOLOGY  COUNTS:  YES  TOURNIQUET:  * No tourniquets in log *  DICTATION: .Other Dictation: Dictation Number [id number]  PLAN OF CARE: Discharge to home after PACU  PATIENT DISPOSITION:  PACU - hemodynamically stable.   Delay start of Pharmacological VTE agent (>24hrs) due to surgical blood loss or risk of bleeding: yes

## 2017-04-23 NOTE — H&P (Signed)
Jonathan Marshall is an 84 y.o. male.    Chief Complaint: Pre-op LEFT diagnostic Ureteroscopy / Stent Exchange  HPI:   1 - Left Ureteral Cancer - approx 2cm left distal papillary tumor by ureteroscopy / [place] 03/2017 by Dahlstedt on eval recurrent hematuria. Path non-diagnostic but clearly urothelial by endoscopic appearance. 6x26 contour stent left I nplace.   2 - Very Low Risk Prostate Cancer - <5% Gleason 6 disease incidental in TURP chips 08/2016 ==> surveillance.   3 - Prostatic Hypertrophy with Lower Urinary Tract Symptoms - s/p TURP 08/2016 by Dahlstedt. On finasteride post-op to prevent regrowth.   4 - Large Retroperitoneal Lymph Node Mass - 6cm conglomerate of nodes around IVC / Aorta area on restaging CT 03/2017. Lamonte Sakai has h/o colon cancer, prostate cancer, and ureteral cancer, unclear if this is new lymphoma v. Met of one of these primaries.   PMH sig for colon cancer (s/p primary resection / re-anastamosis (never had colosomy) + chemo 1993, follows [facility] now), Rt inguinal hernia repair. His PCP is Lorene Dy MD   Today " Rush Landmark " is seen to proceed with diagnostic left ureteroscopy to verify size / location of left ureteral cancer prior to any definitive therapy. No interval fevers.     Past Medical History:  Diagnosis Date  . BPH (benign prostatic hyperplasia)   . Cancer [facility]) 1993   Colon  . DDD (degenerative disc disease), lumbar    L4-L5  . Depression   . Glaucoma, both eyes   . Hearing loss   . Hematuria, gross   . History of adenomatous polyp of colon   . History of carpal tunnel syndrome    left  . History of colon cancer    dx 1993  s/p  hemicolectomy (malignant tumor x2)  and chemo therapy completed 1993--- no recurrence per pt  . History of shingles   . Hyperlipidemia   . Insomnia     Past Surgical History:  Procedure Laterality Date  . APPENDECTOMY    . CARPAL TUNNEL RELEASE Left 08/13/2013   Procedure: CARPAL TUNNEL RELEASE LEFT WRIST;  Surgeon: Yvette Rack., MD;  Location: [place];  Service: Orthopedics;  Laterality: Left;  . COLONOSCOPY    . CYSTOSCOPY W/ RETROGRADES Bilateral 04/03/2017   Procedure: CYSTOSCOPY WITH RETROGRADE PYELOGRAM, LEFT URETEROSCOPY, LEFT STENT PLACEMENT;  Surgeon: Franchot Gallo, MD;  Location: [facility];  Service: Urology;  Laterality: Bilateral;  . CYSTOSCOPY WITH FULGERATION Left 04/03/2017   Procedure: LEFT URETERAL BIOPSY;  Surgeon: Franchot Gallo, MD;  Location: [facility];  Service: Urology;  Laterality: Left;  . HEMICOLECTOMY  1993   malignant tumor x2 , colon cancer and Appendectomy  . INGUINAL HERNIA REPAIR Right 2003  . MASTOIDECTOMY Right 2015  . SIGMOIDOSCOPY  last one 06/ 2018  . TRANSURETHRAL RESECTION OF PROSTATE N/A 09/02/2016   Procedure: TRANSURETHRAL RESECTION OF THE PROSTATE (TURP);  Surgeon: Franchot Gallo, MD;  Location: [facility];  Service: Urology;  Laterality: N/A;    History reviewed. No pertinent family history. Social History:  reports that  has never smoked. he has never used smokeless tobacco. He reports that he drinks about 0.6 oz of alcohol per week. He reports that he does not use drugs.  Allergies: No Known Allergies  No medications prior to admission.    No results found for this or any previous visit (from the past 48 hour(s)). No results found.  Review of  Systems  Constitutional: Negative.   HENT: Negative.   Eyes: Negative.   Respiratory: Negative.   Cardiovascular: Negative.   Gastrointestinal: Negative.   Genitourinary: Positive for frequency and urgency.  Musculoskeletal: Negative.   Skin: Negative.   Neurological: Negative.   Endo/Heme/Allergies: Negative.   Psychiatric/Behavioral: Negative.     Height '5\' 9"'$  (1.753 m), weight 97.5 kg (215 lb). Physical Exam  Constitutional: He appears well-developed.  HENT:  Head: Normocephalic.  Eyes: Pupils are equal, round, and reactive  to light.  Neck: Normal range of motion.  Cardiovascular: Normal rate.  Respiratory: Effort normal.  GI: Soft.  Prior scars w/o hernias.   Genitourinary: Penis normal.  Genitourinary Comments: No CVAT at present.   Musculoskeletal: Normal range of motion.  Neurological: He is alert.  Skin: Skin is warm.  Psychiatric: He has a normal mood and affect.     Assessment/Plan  Proceed as planned with LEFT diagnostic ureteroscopy / possible biopsy. Risks, benefits, alternatives, expected peri-op course discussed. I also discussed with Rush Landmark and his family the new large node mass and rec IR biopsy next avail as histology of this will greatly affect his primary treatment of ureteral cancer.    Alexis Frock, MD 04/23/2017, 6:13 AM

## 2017-04-24 ENCOUNTER — Encounter (HOSPITAL_BASED_OUTPATIENT_CLINIC_OR_DEPARTMENT_OTHER): Payer: Self-pay | Admitting: Urology

## 2017-04-24 NOTE — Anesthesia Postprocedure Evaluation (Signed)
Anesthesia Post Note  Patient: Jonathan Marshall  Procedure(s) Performed: CYSTOSCOPY/RETROGRADE/URETEROSCOPY, LEFT  URETEROSCOPY WITY  BIOPSY (Left Ureter) CYSTOSCOPY WITH STENT REPLACEMENT (Left Ureter)     Patient location during evaluation: PACU Anesthesia Type: General Level of consciousness: awake and alert Pain management: pain level controlled Vital Signs Assessment: post-procedure vital signs reviewed and stable Respiratory status: spontaneous breathing, nonlabored ventilation, respiratory function stable and patient connected to nasal cannula oxygen Cardiovascular status: blood pressure returned to baseline and stable Postop Assessment: no apparent nausea or vomiting Anesthetic complications: no    Last Vitals:  Vitals:   04/23/17 1415 04/23/17 1539  BP: 134/70 (!) 143/70  Pulse: 72 72  Resp: 18 16  Temp:  36.7 C  SpO2: 92% 96%    Last Pain:  Vitals:   04/24/17 1317  TempSrc:   PainSc: 0-No pain                 Catalina Gravel

## 2017-04-24 NOTE — Op Note (Signed)
NAME:  Jonathan, Marshall NO.:  MEDICAL RECORD NO.:  [id number]  LOCATION:                                 FACILITY:  PHYSICIAN:  Alexis Frock, MD          DATE OF BIRTH:  DATE OF PROCEDURE: 04/23/2017                              OPERATIVE REPORT   DIAGNOSIS:  Left ureteral cancer.  PROCEDURES: 1. Cystoscopy with left retrograde pyelogram and interpretation. 2. Left ureteroscopy with biopsy. 3. Exchange of left ureteral stent, 6 x 26 Contour, no tether.  ESTIMATED BLOOD LOSS:  Nil.  COMPLICATION:  None.  SPECIMEN:  Left ureteral tumor fragments for permanent pathology.  FINDINGS: 1. Prior transurethral resection of the prostate defect and prostatic     urethra, wide open fossa. 2. Combination papillary nodular left ureteral cancer, this was at the     area of the iliac vessels, estimated at the length of segment     involved was approximately 3 cm. 3. No evidence of additional foci of gross tumor within the kidney or     more proximal ureter. 4. Successful replacement of left ureteral stent, proximal end in the     renal pelvis and distal end in the urinary bladder.  INDICATION:  Jonathan Marshall is a pleasant 84 year old gentleman with history of multiple cancers including colon cancer, prostate cancer and now he has a left distal ureteral mass most consistent with urothelial carcinoma.  He underwent an attempted ureteroscopic biopsy last month with gross appearance of the tumor most consistent with urothelial carcinoma, but ureteroscopic biopsies unfortunately were inconclusive. He was referred for consideration of management with distal ureterectomy.  He also has not had axial imaging in some time.  We therefore recommended repeat-attempt biopsy and axial imaging.  His axial imaging unfortunately revealed a large nodal mass, closed-stitched renal vessel, this was highly concerning for locally-advanced disease or possible recurrence of his prior  cancers, and it was clearly felt that further histologic confirmation of this tumor would be necessary before any decision is made.  Informed consent was obtained and placed in the medical record.  PROCEDURE IN DETAIL:  The patient being, Jonathan Marshall, was verified. Procedure being left ureteroscopy with biopsy was confirmed.  Procedure was carried out.  Time-out was performed.  Intravenous antibiotics were administered.  General anesthesia introduced.  The patient was placed into a low lithotomy position.  Sterile field was created by prepping and draping the patient's penis, perineum and proximal thighs using iodine.  Next, cystourethroscopy was performed using a 22-French rigid cystoscope with offset lens.  Inspection of the anterior-posterior urethra revealed a wide-open prostatic fossa consistent with prior transurethral resection of the prostate.  Inspection of the urinary bladder revealed no diverticula, calcifications, papillary lesions.  The distal end of the left ureteral stent was seen in situ.  It was somewhat encrusted there, was completely removed and set aside for discard.  The left ureteral orifice was then cannulated with 6-French end-hole catheter and left retrograde pyelogram was obtained.  Left retrograde pyelogram demonstrated a single left ureter with single- system left kidney.  There was a shaggy filling defect in  the left distal ureter very close to suspected area of the iliac vessels consistent with likely primary tumor.  There were no additional filling defects.  A 0.038 Zip wire was advanced to the level of the upper pole, set aside as a safety wire.  An 8-French feeding tube placed in the urinary bladder for pressure release.  Next, semi-rigid ureteroscopy was performed to the distal left ureter alongside a separate Sensor working wire.  There was indeed an area of papillary tumor as well as some nodular component in the left distal ureter.  This was  directly at the area of the iliac vessel pulsations.  There were no additional foci on the distal four-fifths of the ureter.  The semi-rigid scope was then exchanged for the flexible digital ureteroscope over the Sensor working wire.  Inspection of the proximal ureter and systematic inspection of the left kidney including all calices x2 revealed again no additional foci of gross tumor.  Using the semi-rigid ureteroscope, once again, attention was directed to that biopsy.  Cold cup biopsy forceps were used to obtain representative samples of the papillary mass.  Given ureteroscopic technique, these fragments were inherently quite small, at least five of these were taken so that there were hopefully be enough tissue conglomerate of confirmed diagnosis and confirm of high-grade or low-grade tumor.  This was set aside for permanent pathology.  Finally, a new 6 x 26 Contour-type stent was placed over the remaining safety wire using cystoscopic, ureteroscopic and fluoroscopic guidance.  Good proximal and distal deployment were noted.  Bladder was emptied per cystoscope, procedure was then terminated.  The patient tolerated the procedure well.  There were no immediate periprocedural complications. The patient was taken to the postanesthesia care unit in stable condition.  Tentative plan is to try to obtain also Interventional Radiology-directed biopsy of the nodal mass, follow up on his pathology today before any definitive ultimate plan is made.          ______________________________ Alexis Frock, MD     TM/MEDQ  D:  04/23/2017  T:  04/24/2017  Job:  [id number]

## 2017-05-05 ENCOUNTER — Other Ambulatory Visit: Payer: Self-pay | Admitting: Urology

## 2017-05-05 DIAGNOSIS — C689 Malignant neoplasm of urinary organ, unspecified: Secondary | ICD-10-CM

## 2017-05-08 ENCOUNTER — Ambulatory Visit (HOSPITAL_COMMUNITY)
Admission: RE | Admit: 2017-05-08 | Discharge: 2017-05-08 | Disposition: A | Discharge status: Home / Self Care | Payer: Medicare Other | Source: Ambulatory Visit | Attending: Urology | Admitting: Urology

## 2017-05-08 ENCOUNTER — Encounter (HOSPITAL_COMMUNITY): Payer: Self-pay

## 2017-05-08 DIAGNOSIS — G4700 Insomnia, unspecified: Secondary | ICD-10-CM | POA: Insufficient documentation

## 2017-05-08 DIAGNOSIS — C689 Malignant neoplasm of urinary organ, unspecified: Secondary | ICD-10-CM | POA: Diagnosis present

## 2017-05-08 DIAGNOSIS — Z7951 Long term (current) use of inhaled steroids: Secondary | ICD-10-CM | POA: Diagnosis not present

## 2017-05-08 DIAGNOSIS — N400 Enlarged prostate without lower urinary tract symptoms: Secondary | ICD-10-CM | POA: Insufficient documentation

## 2017-05-08 DIAGNOSIS — Z8619 Personal history of other infectious and parasitic diseases: Secondary | ICD-10-CM | POA: Insufficient documentation

## 2017-05-08 DIAGNOSIS — E785 Hyperlipidemia, unspecified: Secondary | ICD-10-CM | POA: Insufficient documentation

## 2017-05-08 DIAGNOSIS — M5136 Other intervertebral disc degeneration, lumbar region: Secondary | ICD-10-CM | POA: Insufficient documentation

## 2017-05-08 DIAGNOSIS — C8333 Diffuse large B-cell lymphoma, intra-abdominal lymph nodes: Secondary | ICD-10-CM | POA: Diagnosis not present

## 2017-05-08 DIAGNOSIS — Z79899 Other long term (current) drug therapy: Secondary | ICD-10-CM | POA: Insufficient documentation

## 2017-05-08 DIAGNOSIS — H409 Unspecified glaucoma: Secondary | ICD-10-CM | POA: Insufficient documentation

## 2017-05-08 DIAGNOSIS — Z85038 Personal history of other malignant neoplasm of large intestine: Secondary | ICD-10-CM | POA: Diagnosis not present

## 2017-05-08 DIAGNOSIS — C662 Malignant neoplasm of left ureter: Secondary | ICD-10-CM | POA: Insufficient documentation

## 2017-05-08 DIAGNOSIS — Z8546 Personal history of malignant neoplasm of prostate: Secondary | ICD-10-CM | POA: Diagnosis not present

## 2017-05-08 DIAGNOSIS — F329 Major depressive disorder, single episode, unspecified: Secondary | ICD-10-CM | POA: Insufficient documentation

## 2017-05-08 LAB — CBC WITH DIFFERENTIAL/PLATELET
Basophils Absolute: 0 10*3/uL (ref 0.0–0.1)
Basophils Relative: 0 %
Eosinophils Absolute: 0.5 10*3/uL (ref 0.0–0.7)
Eosinophils Relative: 4 %
HEMATOCRIT: 44.6 % (ref 39.0–52.0)
HEMOGLOBIN: 14.7 g/dL (ref 13.0–17.0)
LYMPHS ABS: 2.9 10*3/uL (ref 0.7–4.0)
Lymphocytes Relative: 26 %
MCH: 32 pg (ref 26.0–34.0)
MCHC: 33 g/dL (ref 30.0–36.0)
MCV: 97.2 fL (ref 78.0–100.0)
MONOS PCT: 10 %
Monocytes Absolute: 1.1 10*3/uL — ABNORMAL HIGH (ref 0.1–1.0)
NEUTROS ABS: 6.8 10*3/uL (ref 1.7–7.7)
NEUTROS PCT: 60 %
Platelets: 294 10*3/uL (ref 150–400)
RBC: 4.59 MIL/uL (ref 4.22–5.81)
RDW: 13.9 % (ref 11.5–15.5)
WBC: 11.3 10*3/uL — ABNORMAL HIGH (ref 4.0–10.5)

## 2017-05-08 LAB — COMPREHENSIVE METABOLIC PANEL
ALBUMIN: 4 g/dL (ref 3.5–5.0)
ALT: 16 U/L — AB (ref 17–63)
AST: 22 U/L (ref 15–41)
Alkaline Phosphatase: 54 U/L (ref 38–126)
Anion gap: 10 (ref 5–15)
BUN: 16 mg/dL (ref 6–20)
CHLORIDE: 110 mmol/L (ref 101–111)
CO2: 19 mmol/L — AB (ref 22–32)
Calcium: 9.6 mg/dL (ref 8.9–10.3)
Creatinine, Ser: 1.54 mg/dL — ABNORMAL HIGH (ref 0.61–1.24)
GFR calc Af Amer: 47 mL/min — ABNORMAL LOW (ref >60)
GFR, EST NON AFRICAN AMERICAN: 40 mL/min — AB (ref >60)
GLUCOSE: 109 mg/dL — AB (ref 65–99)
Potassium: 4.5 mmol/L (ref 3.5–5.1)
Sodium: 139 mmol/L (ref 135–145)
Total Bilirubin: 0.5 mg/dL (ref 0.3–1.2)
Total Protein: 7.8 g/dL (ref 6.5–8.1)

## 2017-05-08 LAB — PROTIME-INR
INR: 1.1
Prothrombin Time: 14.1 seconds (ref 11.4–15.2)

## 2017-05-08 MED ORDER — MIDAZOLAM HCL 2 MG/2ML IJ SOLN
INTRAMUSCULAR | Status: AC | PRN
  Administered 2017-05-08: 1 mg via INTRAVENOUS
  Administered 2017-05-08 (×2): 0.5 mg via INTRAVENOUS

## 2017-05-08 MED ORDER — GELATIN ABSORBABLE 12-7 MM EX MISC
CUTANEOUS | Status: AC | PRN
  Administered 2017-05-08: 1 via TOPICAL

## 2017-05-08 MED ORDER — MIDAZOLAM HCL 2 MG/2ML IJ SOLN
INTRAMUSCULAR | Status: AC
  Filled 2017-05-08: qty 2

## 2017-05-08 MED ORDER — LIDOCAINE-EPINEPHRINE 2 %-1:100000 IJ SOLN
INTRAMUSCULAR | Status: AC | PRN
  Administered 2017-05-08: 10 mL via INTRADERMAL

## 2017-05-08 MED ORDER — MIDAZOLAM HCL 2 MG/2ML IJ SOLN
INTRAMUSCULAR | Status: AC
  Filled 2017-05-08: qty 4

## 2017-05-08 MED ORDER — FENTANYL CITRATE (PF) 100 MCG/2ML IJ SOLN
INTRAMUSCULAR | Status: AC | PRN
  Administered 2017-05-08 (×2): 50 ug via INTRAVENOUS

## 2017-05-08 MED ORDER — FENTANYL CITRATE (PF) 100 MCG/2ML IJ SOLN
INTRAMUSCULAR | Status: AC
  Filled 2017-05-08: qty 4

## 2017-05-08 MED ORDER — SODIUM CHLORIDE 0.9 % IV SOLN
INTRAVENOUS | Status: DC
  Administered 2017-05-08: 09:00:00 via INTRAVENOUS

## 2017-05-08 NOTE — Procedures (Signed)
Pre procedural Dx: Right sided RP mass  Post procedural Dx: Same  Technically successful CT guided biopsy of indeterminate right sided RP mass   EBL: None.   Complications: None immediate.   Ronny Bacon, MD Pager #: [phone]

## 2017-05-08 NOTE — Discharge Instructions (Signed)

## 2017-05-08 NOTE — Consult Note (Signed)
Chief Complaint: Patient was seen in consultation today for CT-guided biopsy of right retroperitoneal nodal mass  Referring Physician(s): Manny,Theodore  Supervising Physician: Sandi Mariscal  Patient Status: [facility] - Out-pt  History of Present Illness: Jonathan Marshall is a 84 y.o. male with history of colon cancer diagnosed in 1993, status post surgery and chemotherapy, prostate cancer diagnosed in 2018 and most recently left ureteral carcinoma.  Recent CT scan has revealed a large right retroperitoneal nodal mass and he presents today for CT-guided biopsy for further evaluation.  Past Medical History:  Diagnosis Date  . BPH (benign prostatic hyperplasia)   . Cancer [facility]) 1993   Colon  . DDD (degenerative disc disease), lumbar    L4-L5  . Depression   . Glaucoma, both eyes   . Hearing loss   . Hematuria, gross   . History of adenomatous polyp of colon   . History of carpal tunnel syndrome    left  . History of colon cancer    dx 1993  s/p  hemicolectomy (malignant tumor x2)  and chemo therapy completed 1993--- no recurrence per pt  . History of shingles   . Hyperlipidemia   . Insomnia     Past Surgical History:  Procedure Laterality Date  . APPENDECTOMY    . CARPAL TUNNEL RELEASE Left 08/13/2013   Procedure: CARPAL TUNNEL RELEASE LEFT WRIST;  Surgeon: Yvette Rack., MD;  Location: [place];  Service: Orthopedics;  Laterality: Left;  . COLONOSCOPY    . CYSTOSCOPY W/ RETROGRADES Bilateral 04/03/2017   Procedure: CYSTOSCOPY WITH RETROGRADE PYELOGRAM, LEFT URETEROSCOPY, LEFT STENT PLACEMENT;  Surgeon: Franchot Gallo, MD;  Location[facility];  Service: Urology;  Laterality: Bilateral;  . CYSTOSCOPY W/ URETERAL STENT PLACEMENT Left 04/23/2017   Procedure: CYSTOSCOPY WITH STENT REPLACEMENT;  Surgeon: Alexis Frock, MD;  Location[facility];  Service: Urology;  Laterality: Left;  . CYSTOSCOPY WITH FULGERATION Left 04/03/2017     Procedure: LEFT URETERAL BIOPSY;  Surgeon: Franchot Gallo, MD;  Location: [facility];  Service: Urology;  Laterality: Left;  . CYSTOSCOPY/RETROGRADE/URETEROSCOPY Left 04/23/2017   Procedure: CYSTOSCOPY/RETROGRADE/URETEROSCOPY, LEFT  URETEROSCOPY WITY  BIOPSY;  Surgeon: Alexis Frock, MD;  Location: [facility];  Service: Urology;  Laterality: Left;  . HEMICOLECTOMY  1993   malignant tumor x2 , colon cancer and Appendectomy  . INGUINAL HERNIA REPAIR Right 2003  . MASTOIDECTOMY Right 2015  . SIGMOIDOSCOPY  last one 06/ 2018  . TRANSURETHRAL RESECTION OF PROSTATE N/A 09/02/2016   Procedure: TRANSURETHRAL RESECTION OF THE PROSTATE (TURP);  Surgeon: Franchot Gallo, MD;  Location: [facility];  Service: Urology;  Laterality: N/A;    Allergies: Patient has no known allergies.  Medications: Prior to Admission medications   Medication Sig Start Date End Date Taking? Authorizing Provider  clotrimazole (LOTRIMIN) 1 % cream Apply 1 application topically 2 (two) times daily as needed (for foot rash).    [provider]  diphenhydramine-acetaminophen (TYLENOL PM) 25-500 MG TABS tablet Take 2 tablets by mouth at bedtime.     [provider]  fluticasone (FLONASE) 50 MCG/ACT nasal spray Place 1 spray into both nostrils as needed.     [provider]  latanoprost (XALATAN) 0.005 % ophthalmic solution Place 1 drop into both eyes at bedtime.    [provider]  lovastatin (MEVACOR) 40 MG tablet Take 40 mg by mouth every evening.    [provider]  Probiotic Product (  PROBIOTIC DAILY) CAPS Take 1 capsule by mouth as needed.     [provider]  sertraline (ZOLOFT) 25 MG tablet Take 25 mg by mouth every morning.     [provider]  traMADol (ULTRAM) 50 MG tablet Take 1-2 tablets (50-100 mg total) by mouth every 6 (six) hours as needed for moderate pain or severe pain. Post-operatively 04/23/17  04/23/18  Alexis Frock, MD     No family history on file.  Social History   Socioeconomic History  . Marital status: Married    Spouse name: Not on file  . Number of children: Not on file  . Years of education: Not on file  . Highest education level: Not on file  Social Needs  . Financial resource strain: Not on file  . Food insecurity - worry: Not on file  . Food insecurity - inability: Not on file  . Transportation needs - medical: Not on file  . Transportation needs - non-medical: Not on file  Occupational History  . Not on file  Tobacco Use  . Smoking status: Never Smoker  . Smokeless tobacco: Never Used  Substance and Sexual Activity  . Alcohol use: Yes    Alcohol/week: 0.6 oz    Types: 1 Glasses of wine per week    Comment: once per week  . Drug use: No  . Sexual activity: Not on file  Other Topics Concern  . Not on file  Social History Narrative  . Not on file      Review of Systems denies fever,HA,CP,dyspnea,cough, abd/back pain,n/v; has had hematuria  Vital Signs: Vitals:   05/08/17 0901  BP: (!) 147/86  Pulse: 62  Resp: 16  Temp: 97.7 F (36.5 C)  SpO2: 100%      Physical Exam awake/alert; chest- CTA bilat; heart- RRR; abd- soft,+BS,NT; no sig LE edema  Imaging: No results found.  Labs:  CBC: Recent Labs    09/02/16 0718 04/23/17 1040  HGB 15.2 13.9  HCT  --  41.0    COAGS: No results for input(s): INR, APTT in the last 8760 hours.  BMP: Recent Labs    04/23/17 1040  NA 143  K 3.9  CL 111  GLUCOSE 106*  BUN 14  CREATININE 1.30*    LIVER FUNCTION TESTS: No results for input(s): BILITOT, AST, ALT, ALKPHOS, PROT, ALBUMIN in the last 8760 hours.  TUMOR MARKERS: No results for input(s): AFPTM, CEA, CA199, CHROMGRNA in the last 8760 hours.  Assessment and Plan: 84 y.o. male with history of colon cancer diagnosed in 1993, status post surgery and chemotherapy, prostate cancer diagnosed in 2018 and most recently left  ureteral carcinoma.  Recent CT scan has revealed a large right retroperitoneal nodal mass and he presents today for CT-guided biopsy for further evaluation.Risks and benefits discussed with the patient/spouse including, but not limited to bleeding, infection, damage to adjacent structures or low yield requiring additional tests.  All of the patient's questions were answered, patient is agreeable to proceed. Consent signed and in chart.     Thank you for this interesting consult.  I greatly enjoyed meeting Jonathan Marshall and look forward to participating in their care.  A copy of this report was sent to the requesting provider on this date.  Electronically Signed: D. Rowe Robert, PA-C 05/08/2017, 9:00 AM   I spent a total of  25 minutes   in face to face in clinical consultation, greater than 50% of which was counseling/coordinating care for CT  guided right retroperitoneal mass biopsy

## 2017-05-13 ENCOUNTER — Telehealth: Payer: Self-pay | Admitting: Hematology

## 2017-05-13 NOTE — Telephone Encounter (Signed)
Appt has been scheduled for the pt to see Dr. Irene Limbo on 3/21 at 230pm. Pt aware to arrive 30 minutes early.

## 2017-05-14 ENCOUNTER — Ambulatory Visit (HOSPITAL_COMMUNITY): Payer: Medicare Other

## 2017-05-14 NOTE — Progress Notes (Signed)
HEMATOLOGY/ONCOLOGY CONSULTATION NOTE  Date of Service: 05/15/2017  Patient Care Team: Lorene Dy, MD as PCP - General (Internal Medicine)  CHIEF COMPLAINTS/PURPOSE OF CONSULTATION:  Newly diagnosed B-Cell Lymphoma  HISTORY OF PRESENTING ILLNESS:   Jonathan Marshall is a wonderful 84 y.o. male who has been referred to [place] by urologist Dr Alexis Frock for evaluation and management of newly diagnosed diffuse large B Cell Lymphoma. He is accompanied today by his wife. The pt reports that he is doing well overall.   The pt reports that in 1993 he had colon cancer with two tumors in the ascending and descending colon, which was picked up from a routine physical. He notes that he was treated with chemotherapy with Levamisole and Leukovorin for 6 months. He has not had any issues with the colon cancer since. He continue to see Dr. Cristina Gong for his GI.   He notes that he has had blood in the urine for a year and a half, picked up in a physical. This prompted his TURP surgery in July 2018. He notes that the bleeding persisted after the surgery. He notes that a stent was placed which has significantly reduced his hematuria. In February 2019 his left uretral mass was identified and subsequently biopsied confirming adenocarcinoma. The pt notes that Dr. Tresa Moore has up to this point been considering removing one of his kidneys, which would have a bearing on our treatment.   A 04/21/17 CT Abdomen identified a right retroperitoneal mass which was biopsied on 05/08/17 which revealed Large B-Cell Lymphoma.   He notes no ongoing health problems and is without physical limitations. He denies having had any blood transfusions.   On 04/23/17 the pt had a Uretral biopsy revealing Ureter, biopsy, Left mass- SUPERFICIAL FRAGMENTS OF UROTHELIAL CARCINOMA ASSOCIATED WITH GRANULATION TISSUE AND NECROSIS.   CT Abdomen of 04/21/17 revealed large right para-aortic retroperitoneal nodal conglomeration in the abdomen, just  inferior to the right renal artery. Imaging features compatible with metastatic disease in this patient with a history of prostate and left urothelial cancer. 2. Aortic atherosclerosis.   Soft Tissue Needle/core biopsy completed on 05/08/17 with results revealing Large B-Cell Lymphoma.   Most recent lab results (05/08/17) of CBC  is as follows: all values are WNL except for WBC at 11.3k, Monocytes Abs at 1.1k, CO2 at 19, Glucose at 109, Creatinine at 1.54, ALT at 16.  On review of systems, pt reports infrequent hematuria, mild back pain associated with golfing, and denies leg swelling, flank pain, fevers, chills, night sweats and any other symptoms.   On PMHx the pt reports Colon cancer in 1993, hypercholesterolemia, glaucoma, low risk prostate cancer, mastoiditis, hernia repair, carpel tunnel. He denies heart or lung problems.  On Social Hx the pt denies smoking and ETOH consumption. On Family Hx the pt reports that his mother had breast cancer when she was roughly 75, and also cervical cancer. On his mother's side, his aunts and uncles had several cancers including lung. Maternal grandfather had prostate cancer.   MEDICAL HISTORY:  Past Medical History:  Diagnosis Date  . BPH (benign prostatic hyperplasia)   . Cancer [facility] 1993   Colon  . DDD (degenerative disc disease), lumbar    L4-L5  . Depression   . Glaucoma, both eyes   . Hearing loss   . Hematuria, gross   . History of adenomatous polyp of colon   . History of carpal tunnel syndrome    left  . History of colon cancer  dx 1993  s/p  hemicolectomy (malignant tumor x2)  and chemo therapy completed 1993--- no recurrence per pt  . History of shingles   . Hyperlipidemia   . Insomnia     SURGICAL HISTORY: Past Surgical History:  Procedure Laterality Date  . APPENDECTOMY    . CARPAL TUNNEL RELEASE Left 08/13/2013   Procedure: CARPAL TUNNEL RELEASE LEFT WRIST;  Surgeon: Yvette Rack., MD;  Location: [place];   Service: Orthopedics;  Laterality: Left;  . COLONOSCOPY    . CYSTOSCOPY W/ RETROGRADES Bilateral 04/03/2017   Procedure: CYSTOSCOPY WITH RETROGRADE PYELOGRAM, LEFT URETEROSCOPY, LEFT STENT PLACEMENT;  Surgeon: Franchot Gallo, MD;  Location: [facility];  Service: Urology;  Laterality: Bilateral;  . CYSTOSCOPY W/ URETERAL STENT PLACEMENT Left 04/23/2017   Procedure: CYSTOSCOPY WITH STENT REPLACEMENT;  Surgeon: Alexis Frock, MD;  Location: [facility];  Service: Urology;  Laterality: Left;  . CYSTOSCOPY WITH FULGERATION Left 04/03/2017   Procedure: LEFT URETERAL BIOPSY;  Surgeon: Franchot Gallo, MD;  Location: [facility];  Service: Urology;  Laterality: Left;  . CYSTOSCOPY/RETROGRADE/URETEROSCOPY Left 04/23/2017   Procedure: CYSTOSCOPY/RETROGRADE/URETEROSCOPY, LEFT  URETEROSCOPY WITY  BIOPSY;  Surgeon: Alexis Frock, MD;  Location: [facility];  Service: Urology;  Laterality: Left;  . HEMICOLECTOMY  1993   malignant tumor x2 , colon cancer and Appendectomy  . INGUINAL HERNIA REPAIR Right 2003  . MASTOIDECTOMY Right 2015  . SIGMOIDOSCOPY  last one 06/ 2018  . TRANSURETHRAL RESECTION OF PROSTATE N/A 09/02/2016   Procedure: TRANSURETHRAL RESECTION OF THE PROSTATE (TURP);  Surgeon: Franchot Gallo, MD;  Location: [facility];  Service: Urology;  Laterality: N/A;    SOCIAL HISTORY: Social History   Socioeconomic History  . Marital status: Married    Spouse name: Not on file  . Number of children: Not on file  . Years of education: Not on file  . Highest education level: Not on file  Occupational History  . Not on file  Social Needs  . Financial resource strain: Not on file  . Food insecurity:    Worry: Not on file    Inability: Not on file  . Transportation needs:    Medical: Not on file    Non-medical: Not on file  Tobacco Use  . Smoking status: Never Smoker  . Smokeless tobacco: Never Used    Substance and Sexual Activity  . Alcohol use: Yes    Alcohol/week: 0.6 oz    Types: 1 Glasses of wine per week    Comment: once per week  . Drug use: No  . Sexual activity: Not on file  Lifestyle  . Physical activity:    Days per week: Not on file    Minutes per session: Not on file  . Stress: Not on file  Relationships  . Social connections:    Talks on phone: Not on file    Gets together: Not on file    Attends religious service: Not on file    Active member of club or organization: Not on file    Attends meetings of clubs or organizations: Not on file    Relationship status: Not on file  . Intimate partner violence:    Fear of current or ex partner: Not on file    Emotionally abused: Not on file    Physically abused: Not on file    Forced sexual activity: Not on file  Other Topics Concern  . Not on file  Social History Narrative  . Not on file    FAMILY HISTORY: No family history on file.  ALLERGIES:  has No Known Allergies.  MEDICATIONS:  Current Outpatient Medications  Medication Sig Dispense Refill  . clotrimazole (LOTRIMIN) 1 % cream Apply 1 application topically 2 (two) times daily as needed (for foot rash).    . diphenhydramine-acetaminophen (TYLENOL PM) 25-500 MG TABS tablet Take 2 tablets by mouth at bedtime.     . fluticasone (FLONASE) 50 MCG/ACT nasal spray Place 1 spray into both nostrils as needed.     . latanoprost (XALATAN) 0.005 % ophthalmic solution Place 1 drop into both eyes at bedtime.    . lovastatin (MEVACOR) 40 MG tablet Take 40 mg by mouth every evening.    . Probiotic Product (PROBIOTIC DAILY) CAPS Take 1 capsule by mouth as needed.     . sertraline (ZOLOFT) 25 MG tablet Take 25 mg by mouth every morning.     . traMADol (ULTRAM) 50 MG tablet Take 1-2 tablets (50-100 mg total) by mouth every 6 (six) hours as needed for moderate pain or severe pain. Post-operatively 20 tablet 0   No current facility-administered medications for this visit.      REVIEW OF SYSTEMS:    10 Point review of Systems was done is negative except as noted above.  PHYSICAL EXAMINATION: ECOG PERFORMANCE STATUS: 1 - Symptomatic but completely ambulatory  . Vitals:   05/15/17 1445  BP: (!) 148/66  Pulse: 97  Resp: 18  Temp: 97.7 F (36.5 C)  SpO2: 97%   Filed Weights   05/15/17 1445  Weight: 215 lb 9.6 oz (97.8 kg)   .Body mass index is 31.84 kg/m.  GENERAL:alert, in no acute distress and comfortable SKIN: no acute rashes, no significant lesions EYES: conjunctiva are pink and non-injected, sclera anicteric OROPHARYNX: MMM, no exudates, no oropharyngeal erythema or ulceration NECK: supple, no JVD LYMPH:  no palpable lymphadenopathy in the cervical, axillary or inguinal regions LUNGS: clear to auscultation b/l with normal respiratory effort HEART: regular rate & rhythm ABDOMEN:  normoactive bowel sounds , non tender, not distended. Extremity: trace pedal edema PSYCH: alert & oriented x 3 with fluent speech NEURO: no focal motor/sensory deficits  LABORATORY DATA:  I have reviewed the data as listed  . CBC Latest Ref Rng & Units 05/15/2017 05/08/2017 04/23/2017  WBC 4.0 - 10.3 K/uL 9.9 11.3(H) -  Hemoglobin 13.0 - 17.1 g/dL 14.6 14.7 13.9  Hematocrit 38.4 - 49.9 % 44.1 44.6 41.0  Platelets 140 - 400 K/uL 257 294 -    . CMP Latest Ref Rng & Units 05/15/2017 05/08/2017 04/23/2017  Glucose 70 - 140 mg/dL 92 109(H) 106(H)  BUN 7 - 26 mg/dL 16 16 14   Creatinine 0.70 - 1.30 mg/dL 1.52(H) 1.54(H) 1.30(H)  Sodium 136 - 145 mmol/L 139 139 143  Potassium 3.5 - 5.1 mmol/L 4.1 4.5 3.9  Chloride 98 - 109 mmol/L 111(H) 110 111  CO2 22 - 29 mmol/L 19(L) 19(L) -  Calcium 8.4 - 10.4 mg/dL 10.0 9.6 -  Total Protein 6.4 - 8.3 g/dL 7.9 7.8 -  Total Bilirubin 0.2 - 1.2 mg/dL 0.3 0.5 -  Alkaline Phos 40 - 150 U/L 62 54 -  AST 5 - 34 U/L 11 22 -  ALT 0 - 55 U/L 13 16(L) -   Component     Latest Ref Rng & Units 05/15/2017  Retic Ct Pct     0.8 - 1.8  % 1.6  RBC.  4.20 - 5.82 MIL/uL 4.62  Retic Count, Absolute     34.8 - 93.9 K/uL 73.9  Prothrombin Time     11.4 - 15.2 seconds 14.5  INR      1.13  APTT     24 - 36 seconds 31  HIV Screen 4th Generation wRfx     Non Reactive Non Reactive  HCV Ab     0.0 - 0.9 s/co ratio <0.1  Hep B Core Ab, Tot     Negative Negative  Hepatitis B Surface Ag     Negative Negative  LDH     125 - 245 U/L 132    05/08/17 Soft Tissue Needle Core Biopsy:   04/23/17 Ureter Biopsy:   RADIOGRAPHIC STUDIES: I have personally reviewed the radiological images as listed and agreed with the findings in the report. Ct Biopsy  Result Date: 05/08/2017 INDICATION: History of prostate cancer with recent diagnosis of ureteral cancer, now with right-sided retroperitoneal nodal conglomeration worrisome for metastatic disease. Please perform CT-guided biopsy for tissue diagnostic purposes. EXAM: CT-GUIDED BIOPSY OF RIGHT-SIDED RETROPERITONEAL NODAL CONGLOMERATION COMPARISON:  CT of the abdomen and pelvis - 04/21/2017 MEDICATIONS: None. ANESTHESIA/SEDATION: Fentanyl 100 mcg IV; Versed 2 mg IV Sedation time: 22 minutes; The patient was continuously monitored during the procedure by the interventional radiology nurse under my direct supervision. CONTRAST:  None. COMPLICATIONS: None immediate. PROCEDURE: Informed consent was obtained from the patient following an explanation of the procedure, risks, benefits and alternatives. A time out was performed prior to the initiation of the procedure. The patient was positioned prone on the CT table and a limited CT was performed for procedural planning demonstrating unchanged size and appearance of infiltrative aortocaval adenopathy with dominant component measuring approximately 6.0 x 4.3 cm (image 45, series 2). The procedure was planned. The operative site was prepped and draped in the usual sterile fashion. Appropriate trajectory was confirmed with a 22 gauge spinal needle after  the adjacent tissues were anesthetized with 1% Lidocaine with epinephrine. Under intermittent CT guidance, a 17 gauge coaxial needle was advanced into the peripheral aspect of the mass. Appropriate positioning was confirmed and 4 core needle biopsy samples were obtained with an 18 gauge core needle biopsy device. The co-axial needle was removed following the administration of a Gel-Foam slurry and superficial hemostasis was achieved with manual compression. A limited postprocedural CT was negative for hemorrhage or additional complication. A dressing was placed. The patient tolerated the procedure well without immediate postprocedural complication. IMPRESSION: Technically successful CT guided core needle biopsy of aortocaval nodal conglomeration. Electronically Signed   By: Sandi Mariscal M.D.   On: 05/08/2017 14:49   04/21/17 CT Abdomen   ASSESSMENT & PLAN:   84 y/o male with  1. Newly diagnosed Diffuse Large B Cell Lymphoma PLAN -Discussed patient's most recent labs, WBC at 11.3k, Creatinine at 1.54.  -05/08/17 Biopsy revealed Large B Cell Lymphoma of the right para-aortic retroperitoneal nodal conglomeration in the abdomen. -Discussed his Diffuse Large B Cell Lymphoma is likely of a germinal center. -The imaging thus far does not indicate staging conclusively, prompting the need for a PET/CT scan.  -Discussed Rituxan and 5-6 cycles of chemotherapy choice pending ECHO. -We will discuss this case further in tumor board. -Caring for both of his cancers will require continual reevaluation. -Discussed placing a port with the pt and he is agreeable to [place] ordering this now. -Blood tests today, PET/CT scan in 5 days, ECHO this week.  -Primary radiation is not recommended.  2. 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  -Surgery for the uretral mass will likely best be saved until after treatment of his lymphoma.  -The patient  has had hematuria for a year and a half, which may indicate a fairly well behaved tumor and the ability to confidently prioritize his lymphoma which could be organ threatening. Also will try to choose regimen that might have some effect on her urothelial cancer as well.   Labs today PET/CT in 5 days ECHO in 5 days Port-a-cath placement by IR in 5-7 days for chemotherapy RTC with Dr Irene Limbo in 10-12 days Schedule for R-[facility] in 2 weeks  Chemo-counseling for [facility] in 2 weeks   All of the patients questions were answered with apparent satisfaction. The patient knows to call the clinic with any problems, questions or concerns.  I spent 55 minutes counseling the patient face to face. The total time spent in the appointment was 80 minutes and more than 50% was on counseling and direct patient cares.    Sullivan Lone MD MS AAHIVMS [facility] [place] Hematology/Oncology Physician [facility]  (Office):       [phone] (Work cell):  [phone] (Fax):           [phone]  05/15/2017 2:03 PM  This document serves as a record of services personally performed by Sullivan Lone, MD. It was created on his behalf by Baldwin Jamaica, a trained medical scribe. The creation of this record is based on the scribe's personal observations and the provider's statements to them.   .I have reviewed the above documentation for accuracy and completeness, and I agree with the above. Brunetta Genera MD MS

## 2017-05-15 ENCOUNTER — Inpatient Hospital Stay: Payer: Medicare Other

## 2017-05-15 ENCOUNTER — Telehealth: Payer: Self-pay | Admitting: Hematology

## 2017-05-15 ENCOUNTER — Encounter: Payer: Self-pay | Admitting: Hematology

## 2017-05-15 ENCOUNTER — Inpatient Hospital Stay: Payer: Medicare Other | Attending: Hematology | Admitting: Hematology

## 2017-05-15 VITALS — BP 148/66 | HR 97 | Temp 97.7°F | Resp 18 | Ht 69.0 in | Wt 215.6 lb

## 2017-05-15 DIAGNOSIS — C8580 Other specified types of non-Hodgkin lymphoma, unspecified site: Secondary | ICD-10-CM

## 2017-05-15 DIAGNOSIS — R319 Hematuria, unspecified: Secondary | ICD-10-CM | POA: Insufficient documentation

## 2017-05-15 DIAGNOSIS — Z85038 Personal history of other malignant neoplasm of large intestine: Secondary | ICD-10-CM

## 2017-05-15 DIAGNOSIS — C689 Malignant neoplasm of urinary organ, unspecified: Secondary | ICD-10-CM

## 2017-05-15 DIAGNOSIS — C662 Malignant neoplasm of left ureter: Secondary | ICD-10-CM | POA: Insufficient documentation

## 2017-05-15 DIAGNOSIS — C8338 Diffuse large B-cell lymphoma, lymph nodes of multiple sites: Secondary | ICD-10-CM

## 2017-05-15 DIAGNOSIS — C8333 Diffuse large B-cell lymphoma, intra-abdominal lymph nodes: Secondary | ICD-10-CM | POA: Insufficient documentation

## 2017-05-15 LAB — CBC WITH DIFFERENTIAL/PLATELET
BASOS ABS: 0 10*3/uL (ref 0.0–0.1)
Basophils Relative: 0 %
EOS ABS: 0.6 10*3/uL — AB (ref 0.0–0.5)
Eosinophils Relative: 6 %
HCT: 44.1 % (ref 38.4–49.9)
HEMOGLOBIN: 14.6 g/dL (ref 13.0–17.1)
Lymphocytes Relative: 24 %
Lymphs Abs: 2.4 10*3/uL (ref 0.9–3.3)
MCH: 31.6 pg (ref 27.2–33.4)
MCHC: 33.1 g/dL (ref 32.0–36.0)
MCV: 95.5 fL (ref 79.3–98.0)
Monocytes Absolute: 1 10*3/uL — ABNORMAL HIGH (ref 0.1–0.9)
Monocytes Relative: 11 %
NEUTROS PCT: 59 %
Neutro Abs: 5.8 10*3/uL (ref 1.5–6.5)
Platelets: 257 10*3/uL (ref 140–400)
RBC: 4.62 MIL/uL (ref 4.20–5.82)
RDW: 13.7 % (ref 11.0–14.6)
WBC: 9.9 10*3/uL (ref 4.0–10.3)

## 2017-05-15 LAB — CMP (CANCER CENTER ONLY)
ALK PHOS: 62 U/L (ref 40–150)
ALT: 13 U/L (ref 0–55)
AST: 11 U/L (ref 5–34)
Albumin: 3.9 g/dL (ref 3.5–5.0)
Anion gap: 9 (ref 3–11)
BUN: 16 mg/dL (ref 7–26)
CO2: 19 mmol/L — ABNORMAL LOW (ref 22–29)
CREATININE: 1.52 mg/dL — AB (ref 0.70–1.30)
Calcium: 10 mg/dL (ref 8.4–10.4)
Chloride: 111 mmol/L — ABNORMAL HIGH (ref 98–109)
GFR, EST AFRICAN AMERICAN: 47 mL/min — AB (ref >60)
GFR, EST NON AFRICAN AMERICAN: 41 mL/min — AB (ref >60)
Glucose, Bld: 92 mg/dL (ref 70–140)
Potassium: 4.1 mmol/L (ref 3.5–5.1)
Sodium: 139 mmol/L (ref 136–145)
Total Bilirubin: 0.3 mg/dL (ref 0.2–1.2)
Total Protein: 7.9 g/dL (ref 6.4–8.3)

## 2017-05-15 LAB — RETICULOCYTES
RBC.: 4.62 MIL/uL (ref 4.20–5.82)
RETIC COUNT ABSOLUTE: 73.9 10*3/uL (ref 34.8–93.9)
RETIC CT PCT: 1.6 % (ref 0.8–1.8)

## 2017-05-15 LAB — PROTIME-INR
INR: 1.13
PROTHROMBIN TIME: 14.5 s (ref 11.4–15.2)

## 2017-05-15 LAB — LACTATE DEHYDROGENASE: LDH: 132 U/L (ref 125–245)

## 2017-05-15 LAB — APTT: APTT: 31 s (ref 24–36)

## 2017-05-15 NOTE — Telephone Encounter (Signed)
Appointments scheduled / IB message sent to [facility] regarding ECHO Auth/ AVS printed per 3/21 los

## 2017-05-15 NOTE — Patient Instructions (Signed)
Thank you for choosing [facility] to provide your oncology and hematology care.  To afford each patient quality time with our providers, please arrive 30 minutes before your scheduled appointment time.  If you arrive late for your appointment, you may be asked to reschedule.  We strive to give you quality time with our providers, and arriving late affects you and other patients whose appointments are after yours.   If you are a no show for multiple scheduled visits, you may be dismissed from the clinic at the providers discretion.    Again, thank you for choosing [facility], our hope is that these requests will decrease the amount of time that you wait before being seen by our physicians.  ______________________________________________________________________  Should you have questions after your visit to the [facility], please contact our office at [phone] between the hours of 8:30 and 4:30 p.m.    Voicemails left after 4:30p.m will not be returned until the following business day.    For prescription refill requests, please have your pharmacy contact us directly.  Please also try to allow 48 hours for prescription requests.    Please contact the scheduling department for questions regarding scheduling.  For scheduling of procedures such as PET scans, CT scans, MRI, Ultrasound, etc please contact central scheduling at [phone].    Resources For Cancer Patients and Caregivers:   Oncolink.org:  A wonderful resource for patients and healthcare providers for information regarding your disease, ways to tract your treatment, what to expect, etc.     American Cancer Society:  800-227-2345  Can help patients locate various types of support and financial assistance  Cancer Care: 1-800-813-HOPE (4673) Provides financial assistance, online support groups, medication/co-pay assistance.    [place] DSS:  336-641-3447 Where to apply for food  stamps, Medicaid, and utility assistance  Medicare Rights Center: 800-333-4114 Helps people with Medicare understand their rights and benefits, navigate the Medicare system, and secure the quality healthcare they deserve  SCAT: 336-333-6589 [place] [employer]'s shared-ride transportation service for eligible riders who have a disability that prevents them from riding the fixed route bus.    For additional information on assistance programs please contact our social worker:   Grier Hock/Abigail Elmore:  [phone]            

## 2017-05-16 LAB — HEPATITIS B SURFACE ANTIGEN: HEP B S AG: NEGATIVE

## 2017-05-16 LAB — HIV ANTIBODY (ROUTINE TESTING W REFLEX): HIV SCREEN 4TH GENERATION: NONREACTIVE

## 2017-05-16 LAB — HEPATITIS B CORE ANTIBODY, TOTAL: Hep B Core Total Ab: NEGATIVE

## 2017-05-16 LAB — HEPATITIS C ANTIBODY

## 2017-05-19 ENCOUNTER — Other Ambulatory Visit: Payer: Self-pay | Admitting: Hematology

## 2017-05-19 ENCOUNTER — Telehealth: Payer: Self-pay | Admitting: Hematology

## 2017-05-19 DIAGNOSIS — C8330 Diffuse large B-cell lymphoma, unspecified site: Secondary | ICD-10-CM

## 2017-05-20 ENCOUNTER — Other Ambulatory Visit: Payer: Self-pay

## 2017-05-20 DIAGNOSIS — C8330 Diffuse large B-cell lymphoma, unspecified site: Secondary | ICD-10-CM

## 2017-05-26 ENCOUNTER — Encounter (HOSPITAL_COMMUNITY): Payer: Self-pay

## 2017-05-26 ENCOUNTER — Ambulatory Visit (HOSPITAL_COMMUNITY)
Admission: RE | Admit: 2017-05-26 | Discharge: 2017-05-26 | Disposition: A | Discharge status: Home / Self Care | Payer: Medicare Other | Source: Ambulatory Visit | Attending: Hematology | Admitting: Hematology

## 2017-05-26 DIAGNOSIS — C689 Malignant neoplasm of urinary organ, unspecified: Secondary | ICD-10-CM

## 2017-05-26 DIAGNOSIS — C8338 Diffuse large B-cell lymphoma, lymph nodes of multiple sites: Secondary | ICD-10-CM

## 2017-05-27 ENCOUNTER — Ambulatory Visit: Payer: Medicare Other | Admitting: Hematology

## 2017-05-27 ENCOUNTER — Telehealth: Payer: Self-pay

## 2017-05-27 NOTE — Telephone Encounter (Signed)
Pt forgot about instructions to remain NPO 6 hours prior to PET scan and was rescheduled for 05/29/17. Chemo education and MD f.u cancelled tomorrow (05/28/17) and rescheduled for 05/29/17 at 1520 and 1600. Pt verbalized understanding of changes and thanks for calling him back to change the appointments.

## 2017-05-28 ENCOUNTER — Ambulatory Visit (HOSPITAL_COMMUNITY)
Admission: RE | Admit: 2017-05-28 | Discharge: 2017-05-28 | Disposition: A | Discharge status: Home / Self Care | Payer: Medicare Other | Source: Ambulatory Visit | Attending: Hematology | Admitting: Hematology

## 2017-05-28 ENCOUNTER — Other Ambulatory Visit: Payer: Medicare Other

## 2017-05-28 ENCOUNTER — Other Ambulatory Visit: Payer: Self-pay | Admitting: Radiology

## 2017-05-28 ENCOUNTER — Ambulatory Visit: Payer: Medicare Other | Admitting: Hematology

## 2017-05-28 DIAGNOSIS — Z85038 Personal history of other malignant neoplasm of large intestine: Secondary | ICD-10-CM | POA: Insufficient documentation

## 2017-05-28 DIAGNOSIS — C8338 Diffuse large B-cell lymphoma, lymph nodes of multiple sites: Secondary | ICD-10-CM | POA: Insufficient documentation

## 2017-05-28 DIAGNOSIS — I517 Cardiomegaly: Secondary | ICD-10-CM | POA: Diagnosis not present

## 2017-05-28 DIAGNOSIS — E785 Hyperlipidemia, unspecified: Secondary | ICD-10-CM | POA: Diagnosis not present

## 2017-05-28 NOTE — Progress Notes (Signed)
HEMATOLOGY/ONCOLOGY CLINIC NOTE  Date of Service: 05/29/17  Patient Care Team: Lorene Dy, MD as PCP - General (Internal Medicine)  CHIEF COMPLAINTS/PURPOSE OF CONSULTATION:  Newly diagnosed diffuse large B-Cell Lymphoma  HISTORY OF PRESENTING ILLNESS:   BANDON Marshall is a wonderful 84 y.o. male who has been referred to [place] by urologist Dr Alexis Frock for evaluation and management of newly diagnosed diffuse large B Cell Lymphoma. He is accompanied today by his wife. The pt reports that he is doing well overall.   The pt reports that in 1993 he had colon cancer with two tumors in the ascending and descending colon, which was picked up from a routine physical. He notes that he was treated with chemotherapy with Levamisole and Leukovorin for 6 months. He has not had any issues with the colon cancer since. He continue to see Dr. Cristina Gong for his GI.   He notes that he has had blood in the urine for a year and a half, picked up in a physical. This prompted his TURP surgery in July 2018. He notes that the bleeding persisted after the surgery. He notes that a stent was placed which has significantly reduced his hematuria. In February 2019 his left uretral mass was identified and subsequently biopsied confirming adenocarcinoma. The pt notes that Dr. Tresa Moore has up to this point been considering removing one of his kidneys, which would have a bearing on our treatment.   A 04/21/17 CT Abdomen identified a right retroperitoneal mass which was biopsied on 05/08/17 which revealed Large B-Cell Lymphoma.   He notes no ongoing health problems and is without physical limitations. He denies having had any blood transfusions.   On 04/23/17 the pt had a Uretral biopsy revealing Ureter, biopsy, Left mass- SUPERFICIAL FRAGMENTS OF UROTHELIAL CARCINOMA ASSOCIATED WITH GRANULATION TISSUE AND NECROSIS.   CT Abdomen of 04/21/17 revealed large right para-aortic retroperitoneal nodal conglomeration in the abdomen,  just inferior to the right renal artery. Imaging features compatible with metastatic disease in this patient with a history of prostate and left urothelial cancer. 2. Aortic atherosclerosis.   Soft Tissue Needle/core biopsy completed on 05/08/17 with results revealing Large B-Cell Lymphoma.   Most recent lab results (05/08/17) of CBC  is as follows: all values are WNL except for WBC at 11.3k, Monocytes Abs at 1.1k, CO2 at 19, Glucose at 109, Creatinine at 1.54, ALT at 16.  On review of systems, pt reports infrequent hematuria, mild back pain associated with golfing, and denies leg swelling, flank pain, fevers, chills, night sweats and any other symptoms.   On PMHx the pt reports Colon cancer in 1993, hypercholesterolemia, glaucoma, low risk prostate cancer, mastoiditis, hernia repair, carpel tunnel. He denies heart or lung problems.  On Social Hx the pt denies smoking and ETOH consumption. On Family Hx the pt reports that his mother had breast cancer when she was roughly 50, and also cervical cancer. On his mother's side, his aunts and uncles had several cancers including lung. Maternal grandfather had prostate cancer.   Interval History:  Jonathan Marshall returns today regarding his Diffuse Large B-cell Lymphoma. [name] was on 05/15/17. He is accompanied today by his wife. The pt reports that he is doing well overall.   The pt reports that he will see urologist Dr Tresa Moore next week on 06/02/17.  The pt notes that he will be getting his port placed tomorrow 05/30/17 and has already had his chemo counseling.   Of  note since the patient's last visit, pt has had a PET completed on 05/26/17 with results revealing 1. 5 cm right-sided retroperitoneal nodal mass is hypermetabolic and consistent with known lymphoma. 2. Small lymph node just below the aortic bifurcation is also hypermetabolic. 3. No adenopathy in the neck, chest, axilla, pelvis or inguinal regions. 4. No findings for  osseous metastatic disease.  On 05/28/17 the patient also had an ECHO which revealed Normal LV size with mild LV hypertrophy. EF 55%. Normal RV size and systolic function. No significant valvular abnormalities.   Lab results (05/15/17) of CBC, CMP, and Reticulocytes is as follows: all values are WNL except for Monocytes Abs at 1.0k, Eosinophils Abs at 0.6k, Chloride at 111, CO2 at 19, Creatinine at 1.52. LDH 05/15/17 is WNL at 132.   On review of systems, pt reports good energy levels, and denies fevers, chills, night sweats, abdominal pains, and any other symptoms.   MEDICAL HISTORY:  Past Medical History:  Diagnosis Date  . BPH (benign prostatic hyperplasia)   . Cancer [facility]) 1993   Colon  . DDD (degenerative disc disease), lumbar    L4-L5  . Depression   . Glaucoma, both eyes   . Hearing loss   . Hematuria, gross   . History of adenomatous polyp of colon   . History of carpal tunnel syndrome    left  . History of colon cancer    dx 1993  s/p  hemicolectomy (malignant tumor x2)  and chemo therapy completed 1993--- no recurrence per pt  . History of shingles   . Hyperlipidemia   . Insomnia     SURGICAL HISTORY: Past Surgical History:  Procedure Laterality Date  . APPENDECTOMY    . CARPAL TUNNEL RELEASE Left 08/13/2013   Procedure: CARPAL TUNNEL RELEASE LEFT WRIST;  Surgeon: Yvette Rack., MD;  Location: [facility];  Service: Orthopedics;  Laterality: Left;  . COLONOSCOPY    . CYSTOSCOPY W/ RETROGRADES Bilateral 04/03/2017   Procedure: CYSTOSCOPY WITH RETROGRADE PYELOGRAM, LEFT URETEROSCOPY, LEFT STENT PLACEMENT;  Surgeon: Franchot Gallo, MD;  Location: [facility];  Service: Urology;  Laterality: Bilateral;  . CYSTOSCOPY W/ URETERAL STENT PLACEMENT Left 04/23/2017   Procedure: CYSTOSCOPY WITH STENT REPLACEMENT;  Surgeon: Alexis Frock, MD;  Location: [facility];  Service: Urology;  Laterality: Left;  . CYSTOSCOPY WITH FULGERATION  Left 04/03/2017   Procedure: LEFT URETERAL BIOPSY;  Surgeon: Franchot Gallo, MD;  Location: [facility];  Service: Urology;  Laterality: Left;  . CYSTOSCOPY/RETROGRADE/URETEROSCOPY Left 04/23/2017   Procedure: CYSTOSCOPY/RETROGRADE/URETEROSCOPY, LEFT  URETEROSCOPY WITY  BIOPSY;  Surgeon: Alexis Frock, MD;  Location: [facility];  Service: Urology;  Laterality: Left;  . HEMICOLECTOMY  1993   malignant tumor x2 , colon cancer and Appendectomy  . INGUINAL HERNIA REPAIR Right 2003  . MASTOIDECTOMY Right 2015  . SIGMOIDOSCOPY  last one 06/ 2018  . TRANSURETHRAL RESECTION OF PROSTATE N/A 09/02/2016   Procedure: TRANSURETHRAL RESECTION OF THE PROSTATE (TURP);  Surgeon: Franchot Gallo, MD;  Location: [facility];  Service: Urology;  Laterality: N/A;    SOCIAL HISTORY: Social History   Socioeconomic History  . Marital status: Married    Spouse name: Not on file  . Number of children: Not on file  . Years of education: Not on file  . Highest education level: Not on file  Occupational History  . Not on file  Social Needs  . Financial resource strain: Not on  file  . Food insecurity:    Worry: Not on file    Inability: Not on file  . Transportation needs:    Medical: Not on file    Non-medical: Not on file  Tobacco Use  . Smoking status: Never Smoker  . Smokeless tobacco: Never Used  Substance and Sexual Activity  . Alcohol use: Yes    Alcohol/week: 0.6 oz    Types: 1 Glasses of wine per week    Comment: once per week  . Drug use: No  . Sexual activity: Not on file  Lifestyle  . Physical activity:    Days per week: Not on file    Minutes per session: Not on file  . Stress: Not on file  Relationships  . Social connections:    Talks on phone: Not on file    Gets together: Not on file    Attends religious service: Not on file    Active member of club or organization: Not on file    Attends meetings of clubs or organizations: Not  on file    Relationship status: Not on file  . Intimate partner violence:    Fear of current or ex partner: Not on file    Emotionally abused: Not on file    Physically abused: Not on file    Forced sexual activity: Not on file  Other Topics Concern  . Not on file  Social History Narrative  . Not on file    FAMILY HISTORY: History reviewed. No pertinent family history.  ALLERGIES:  has No Known Allergies.  MEDICATIONS:  Current Outpatient Medications  Medication Sig Dispense Refill  . clotrimazole (LOTRIMIN) 1 % cream Apply 1 application topically 2 (two) times daily as needed (for foot rash).    . diphenhydramine-acetaminophen (TYLENOL PM) 25-500 MG TABS tablet Take 2 tablets by mouth at bedtime.     . fluticasone (FLONASE) 50 MCG/ACT nasal spray Place 1 spray into both nostrils as needed.     . lovastatin (MEVACOR) 40 MG tablet Take 40 mg by mouth every evening.    . sertraline (ZOLOFT) 25 MG tablet Take 25 mg by mouth every morning.     . latanoprost (XALATAN) 0.005 % ophthalmic solution Place 1 drop into both eyes at bedtime.     No current facility-administered medications for this visit.     REVIEW OF SYSTEMS:    .10 Point review of Systems was done is negative except as noted above.   PHYSICAL EXAMINATION: ECOG PERFORMANCE STATUS: 1 - Symptomatic but completely ambulatory  . Vitals:   05/29/17 1626  BP: (!) 161/80  Pulse: 66  Resp: 20  Temp: 98.1 F (36.7 C)  SpO2: 98%   Filed Weights   05/29/17 1626  Weight: 217 lb 4.8 oz (98.6 kg)   .Body mass index is 32.09 kg/m.  [name] Kitchen GENERAL:alert, in no acute distress and comfortable SKIN: no acute rashes, no significant lesions EYES: conjunctiva are pink and non-injected, sclera anicteric OROPHARYNX: MMM, no exudates, no oropharyngeal erythema or ulceration NECK: supple, no JVD LYMPH:  no palpable lymphadenopathy in the cervical, axillary or inguinal regions LUNGS: clear to auscultation b/l with normal  respiratory effort HEART: regular rate & rhythm ABDOMEN:  normoactive bowel sounds , non tender, not distended. Extremity: no pedal edema PSYCH: alert & oriented x 3 with fluent speech NEURO: no focal motor/sensory deficits  LABORATORY DATA:  I have reviewed the data as listed  . CBC Latest Ref Rng & Units 05/30/2017 05/15/2017  05/08/2017  WBC 4.0 - 10.5 K/uL 9.6 9.9 11.3(H)  Hemoglobin 13.0 - 17.0 g/dL 14.7 14.6 14.7  Hematocrit 39.0 - 52.0 % 43.4 44.1 44.6  Platelets 150 - 400 K/uL 271 257 294    . CMP Latest Ref Rng & Units 05/30/2017 05/15/2017 05/08/2017  Glucose 65 - 99 mg/dL 118(H) 92 109(H)  BUN 6 - 20 mg/dL 18 16 16   Creatinine 0.61 - 1.24 mg/dL 1.56(H) 1.52(H) 1.54(H)  Sodium 135 - 145 mmol/L 140 139 139  Potassium 3.5 - 5.1 mmol/L 3.9 4.1 4.5  Chloride 101 - 111 mmol/L 111 111(H) 110  CO2 22 - 32 mmol/L 20(L) 19(L) 19(L)  Calcium 8.9 - 10.3 mg/dL 9.5 10.0 9.6  Total Protein 6.4 - 8.3 g/dL - 7.9 7.8  Total Bilirubin 0.2 - 1.2 mg/dL - 0.3 0.5  Alkaline Phos 40 - 150 U/L - 62 54  AST 5 - 34 U/L - 11 22  ALT 0 - 55 U/L - 13 16(L)   Component     Latest Ref Rng & Units 05/15/2017  Retic Ct Pct     0.8 - 1.8 % 1.6  RBC.     4.20 - 5.82 MIL/uL 4.62  Retic Count, Absolute     34.8 - 93.9 K/uL 73.9  Prothrombin Time     11.4 - 15.2 seconds 14.5  INR      1.13  APTT     24 - 36 seconds 31  HIV Screen 4th Generation wRfx     Non Reactive Non Reactive  HCV Ab     0.0 - 0.9 s/co ratio <0.1  Hep B Core Ab, Tot     Negative Negative  Hepatitis B Surface Ag     Negative Negative  LDH     125 - 245 U/L 132    05/08/17 Soft Tissue Needle Core Biopsy:   04/23/17 Ureter Biopsy:   RADIOGRAPHIC STUDIES: I have personally reviewed the radiological images as listed and agreed with the findings in the report. Nm Pet Image Initial (pi) Skull Base To Thigh  Result Date: 05/29/2017 CLINICAL DATA:  Initial treatment strategy for diffuse large B-cell lymphoma. History of  prostate cancer and uroepithelial neoplasm. EXAM: NUCLEAR MEDICINE PET SKULL BASE TO THIGH TECHNIQUE: 10.75 mCi F-18 FDG was injected intravenously. Full-ring PET imaging was performed from the skull base to thigh after the radiotracer. CT data was obtained and used for attenuation correction and anatomic localization. Fasting blood glucose: 117 mg/dl COMPARISON:  CT scan 04/21/2017 FINDINGS: Mediastinal blood pool activity: SUV max 2.74 NECK: No hypermetabolic lymph nodes in the neck. Incidental CT findings: none CHEST: No hypermetabolic mediastinal or hilar nodes. No suspicious pulmonary nodules on the CT scan. No supraclavicular or axillary adenopathy. Incidental CT findings: none ABDOMEN/PELVIS: 5 cm right-sided retroperitoneal nodal mass is hypermetabolic with SUV max of 35.46. Slightly more inferiorly just below the aortic bifurcation there is a 8 mm lymph node which is hypermetabolic with SUV max of 5.68. No pelvic or inguinal adenopathy. Incidental CT findings: There is a left-sided double-J ureteral stent in place. No left-sided hydronephrosis. A large right renal cyst is stable. Mild bladder wall thickening but no obvious bladder mass. SKELETON: No focal hypermetabolic activity to suggest skeletal metastasis. Incidental CT findings: none IMPRESSION: 1. 5 cm right-sided retroperitoneal nodal mass is hypermetabolic and consistent with known lymphoma. 2. Small lymph node just below the aortic bifurcation is also hypermetabolic. 3. No adenopathy in the neck, chest, axilla, pelvis or inguinal regions.  4. No findings for osseous metastatic disease. Electronically Signed   By: Marijo Sanes M.D.   On: 05/29/2017 16:17   Ct Biopsy  Result Date: 05/08/2017 INDICATION: History of prostate cancer with recent diagnosis of ureteral cancer, now with right-sided retroperitoneal nodal conglomeration worrisome for metastatic disease. Please perform CT-guided biopsy for tissue diagnostic purposes. EXAM: CT-GUIDED BIOPSY  OF RIGHT-SIDED RETROPERITONEAL NODAL CONGLOMERATION COMPARISON:  CT of the abdomen and pelvis - 04/21/2017 MEDICATIONS: None. ANESTHESIA/SEDATION: Fentanyl 100 mcg IV; Versed 2 mg IV Sedation time: 22 minutes; The patient was continuously monitored during the procedure by the interventional radiology nurse under my direct supervision. CONTRAST:  None. COMPLICATIONS: None immediate. PROCEDURE: Informed consent was obtained from the patient following an explanation of the procedure, risks, benefits and alternatives. A time out was performed prior to the initiation of the procedure. The patient was positioned prone on the CT table and a limited CT was performed for procedural planning demonstrating unchanged size and appearance of infiltrative aortocaval adenopathy with dominant component measuring approximately 6.0 x 4.3 cm (image 45, series 2). The procedure was planned. The operative site was prepped and draped in the usual sterile fashion. Appropriate trajectory was confirmed with a 22 gauge spinal needle after the adjacent tissues were anesthetized with 1% Lidocaine with epinephrine. Under intermittent CT guidance, a 17 gauge coaxial needle was advanced into the peripheral aspect of the mass. Appropriate positioning was confirmed and 4 core needle biopsy samples were obtained with an 18 gauge core needle biopsy device. The co-axial needle was removed following the administration of a Gel-Foam slurry and superficial hemostasis was achieved with manual compression. A limited postprocedural CT was negative for hemorrhage or additional complication. A dressing was placed. The patient tolerated the procedure well without immediate postprocedural complication. IMPRESSION: Technically successful CT guided core needle biopsy of aortocaval nodal conglomeration. Electronically Signed   By: Sandi Mariscal M.D.   On: 05/08/2017 14:49   04/21/17 CT Abdomen   ASSESSMENT & PLAN:   84 y/o male with  1. Newly diagnosed  Diffuse Large B Cell Lymphoma - Stage II -bulky PLAN Discussed pt labwork today; blood counts and chemistries are stable.  -Reviewed the 05/26/17 PET/CT which showed 1. 5 cm right-sided retroperitoneal nodal mass is hypermetabolic and consistent with known lymphoma. Small lymph node just below the aortic bifurcation is also hypermetabolic. -Discussed that the lymphoma staging is  II -bulky -Discussed that the treatment of lymphoma is more pressing at this time -ECHO shows normal EF. -- will plan to treat with R-mini CHOP with G-CSF support for 3-4 cycles followed by surgery for urothelial cancer followed by +/- 2 additional cycles of Ctx +/- ISRT. -previous discussed 05/08/17 Biopsy revealed Large B Cell Lymphoma of the right para-aortic retroperitoneal nodal conglomeration in the abdomen.-Caring for both of his cancers will require continual reevaluation. -Discussed placing a port with the pt and he is agreeable to [place] ordering this now. -Reviewed that the patient's 05/28/17 ECHO showed normal EF -Discussed that we would like to reassess patient's progress after 3 cycles. -Prednisone for 5 days each cycle.  -Neulasta on D2 or D3 of each cycle. -The Doxorubicin hopefully may be able to reasonably control the urothelial cancer temporarily pending surgery -Will prescribe Emla cream as well, in addition to anti-nausea meds for prn use -Will prescribe Ativan for prn use  2. 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  -Surgery for the uretral mass  will likely best be saved until after treatment of his lymphoma.  -The patient has had hematuria for a year and a half, which may indicate a fairly well behaved tumor and the ability to confidently prioritize his lymphoma which would be rapidly progressive without treatment. -plan as above -continue urologic f/u with Dr Tresa Moore.  All of the patients questions were answered with apparent  satisfaction. The patient knows to call the clinic with any problems, questions or concerns.  . The total time spent in the appointment was 40 minutes and more than 50% was on counseling and direct patient cares and co-ordination of cares.  Sullivan Lone MD MS AAHIVMS [facility] Hematology/Oncology Physician [facility]  (Office):       [phone] (Work cell):  [phone] (Fax):           [phone]  05/29/2017 4:55 PM  This document serves as a record of services personally performed by Sullivan Lone, MD. It was created on his behalf by Baldwin Jamaica, a trained medical scribe. The creation of this record is based on the scribe's personal observations and the provider's statements to them.   .I have reviewed the above documentation for accuracy and completeness, and I agree with the above. Brunetta Genera MD MS

## 2017-05-28 NOTE — Progress Notes (Signed)
  Echocardiogram 2D Echocardiogram has been performed.  Darlina Sicilian M 05/28/2017, 10:32 AM

## 2017-05-29 ENCOUNTER — Ambulatory Visit (HOSPITAL_COMMUNITY)
Admission: RE | Admit: 2017-05-29 | Discharge: 2017-05-29 | Disposition: A | Discharge status: Home / Self Care | Payer: Medicare Other | Source: Ambulatory Visit | Attending: Hematology | Admitting: Hematology

## 2017-05-29 ENCOUNTER — Inpatient Hospital Stay: Payer: Medicare Other

## 2017-05-29 ENCOUNTER — Encounter: Payer: Self-pay | Admitting: Hematology

## 2017-05-29 ENCOUNTER — Inpatient Hospital Stay: Payer: Medicare Other | Attending: Hematology | Admitting: Hematology

## 2017-05-29 ENCOUNTER — Other Ambulatory Visit: Payer: Self-pay | Admitting: Student

## 2017-05-29 VITALS — BP 161/80 | HR 66 | Temp 98.1°F | Resp 20 | Ht 69.0 in | Wt 217.3 lb

## 2017-05-29 DIAGNOSIS — M5136 Other intervertebral disc degeneration, lumbar region: Secondary | ICD-10-CM | POA: Insufficient documentation

## 2017-05-29 DIAGNOSIS — Z5189 Encounter for other specified aftercare: Secondary | ICD-10-CM | POA: Diagnosis not present

## 2017-05-29 DIAGNOSIS — C669 Malignant neoplasm of unspecified ureter: Secondary | ICD-10-CM | POA: Diagnosis present

## 2017-05-29 DIAGNOSIS — Z5112 Encounter for antineoplastic immunotherapy: Secondary | ICD-10-CM | POA: Diagnosis present

## 2017-05-29 DIAGNOSIS — Z5111 Encounter for antineoplastic chemotherapy: Secondary | ICD-10-CM | POA: Diagnosis present

## 2017-05-29 DIAGNOSIS — C8330 Diffuse large B-cell lymphoma, unspecified site: Secondary | ICD-10-CM

## 2017-05-29 DIAGNOSIS — N400 Enlarged prostate without lower urinary tract symptoms: Secondary | ICD-10-CM | POA: Insufficient documentation

## 2017-05-29 DIAGNOSIS — E785 Hyperlipidemia, unspecified: Secondary | ICD-10-CM | POA: Insufficient documentation

## 2017-05-29 DIAGNOSIS — H409 Unspecified glaucoma: Secondary | ICD-10-CM | POA: Insufficient documentation

## 2017-05-29 DIAGNOSIS — Z85038 Personal history of other malignant neoplasm of large intestine: Secondary | ICD-10-CM | POA: Insufficient documentation

## 2017-05-29 DIAGNOSIS — C8333 Diffuse large B-cell lymphoma, intra-abdominal lymph nodes: Secondary | ICD-10-CM | POA: Insufficient documentation

## 2017-05-29 DIAGNOSIS — C662 Malignant neoplasm of left ureter: Secondary | ICD-10-CM | POA: Diagnosis not present

## 2017-05-29 DIAGNOSIS — G4700 Insomnia, unspecified: Secondary | ICD-10-CM | POA: Diagnosis not present

## 2017-05-29 DIAGNOSIS — F329 Major depressive disorder, single episode, unspecified: Secondary | ICD-10-CM | POA: Insufficient documentation

## 2017-05-29 DIAGNOSIS — Z7951 Long term (current) use of inhaled steroids: Secondary | ICD-10-CM | POA: Diagnosis not present

## 2017-05-29 LAB — GLUCOSE, CAPILLARY: Glucose-Capillary: 117 mg/dL — ABNORMAL HIGH (ref 65–99)

## 2017-05-29 MED ORDER — FLUDEOXYGLUCOSE F - 18 (FDG) INJECTION
10.7500 | Freq: Once | INTRAVENOUS | Status: AC | PRN
  Administered 2017-05-29: 10.75 via INTRAVENOUS

## 2017-05-30 ENCOUNTER — Other Ambulatory Visit: Payer: Self-pay | Admitting: Hematology

## 2017-05-30 ENCOUNTER — Encounter (HOSPITAL_COMMUNITY): Payer: Self-pay

## 2017-05-30 ENCOUNTER — Ambulatory Visit (HOSPITAL_COMMUNITY)
Admission: RE | Admit: 2017-05-30 | Discharge: 2017-05-30 | Disposition: A | Discharge status: Home / Self Care | Payer: Medicare Other | Source: Ambulatory Visit | Attending: Hematology | Admitting: Hematology

## 2017-05-30 ENCOUNTER — Other Ambulatory Visit: Payer: Medicare Other

## 2017-05-30 ENCOUNTER — Telehealth: Payer: Self-pay

## 2017-05-30 DIAGNOSIS — C8330 Diffuse large B-cell lymphoma, unspecified site: Secondary | ICD-10-CM

## 2017-05-30 DIAGNOSIS — C669 Malignant neoplasm of unspecified ureter: Secondary | ICD-10-CM | POA: Diagnosis not present

## 2017-05-30 HISTORY — PX: IR FLUORO GUIDE PORT INSERTION RIGHT: IMG5741

## 2017-05-30 HISTORY — PX: IR US GUIDE VASC ACCESS RIGHT: IMG2390

## 2017-05-30 LAB — BASIC METABOLIC PANEL
ANION GAP: 9 (ref 5–15)
BUN: 18 mg/dL (ref 6–20)
CHLORIDE: 111 mmol/L (ref 101–111)
CO2: 20 mmol/L — ABNORMAL LOW (ref 22–32)
Calcium: 9.5 mg/dL (ref 8.9–10.3)
Creatinine, Ser: 1.56 mg/dL — ABNORMAL HIGH (ref 0.61–1.24)
GFR, EST AFRICAN AMERICAN: 46 mL/min — AB (ref >60)
GFR, EST NON AFRICAN AMERICAN: 40 mL/min — AB (ref >60)
Glucose, Bld: 118 mg/dL — ABNORMAL HIGH (ref 65–99)
POTASSIUM: 3.9 mmol/L (ref 3.5–5.1)
SODIUM: 140 mmol/L (ref 135–145)

## 2017-05-30 LAB — CBC WITH DIFFERENTIAL/PLATELET
Basophils Absolute: 0 10*3/uL (ref 0.0–0.1)
Basophils Relative: 0 %
EOS ABS: 0.5 10*3/uL (ref 0.0–0.7)
Eosinophils Relative: 5 %
HEMATOCRIT: 43.4 % (ref 39.0–52.0)
HEMOGLOBIN: 14.7 g/dL (ref 13.0–17.0)
LYMPHS ABS: 2.4 10*3/uL (ref 0.7–4.0)
Lymphocytes Relative: 25 %
MCH: 32.2 pg (ref 26.0–34.0)
MCHC: 33.9 g/dL (ref 30.0–36.0)
MCV: 95.2 fL (ref 78.0–100.0)
MONOS PCT: 9 %
Monocytes Absolute: 0.8 10*3/uL (ref 0.1–1.0)
NEUTROS ABS: 5.9 10*3/uL (ref 1.7–7.7)
NEUTROS PCT: 61 %
Platelets: 271 10*3/uL (ref 150–400)
RBC: 4.56 MIL/uL (ref 4.22–5.81)
RDW: 13.7 % (ref 11.5–15.5)
WBC: 9.6 10*3/uL (ref 4.0–10.5)

## 2017-05-30 LAB — PROTIME-INR
INR: 1.1
PROTHROMBIN TIME: 14.1 s (ref 11.4–15.2)

## 2017-05-30 MED ORDER — FENTANYL CITRATE (PF) 100 MCG/2ML IJ SOLN
INTRAMUSCULAR | Status: AC | PRN
  Administered 2017-05-30 (×2): 50 ug via INTRAVENOUS

## 2017-05-30 MED ORDER — CEFAZOLIN SODIUM-DEXTROSE 2-4 GM/100ML-% IV SOLN
2.0000 g | INTRAVENOUS | Status: AC
  Administered 2017-05-30: 2 g via INTRAVENOUS

## 2017-05-30 MED ORDER — CEFAZOLIN SODIUM-DEXTROSE 2-4 GM/100ML-% IV SOLN
INTRAVENOUS | Status: AC
  Filled 2017-05-30: qty 100

## 2017-05-30 MED ORDER — MIDAZOLAM HCL 2 MG/2ML IJ SOLN
INTRAMUSCULAR | Status: AC | PRN
  Administered 2017-05-30 (×2): 1 mg via INTRAVENOUS

## 2017-05-30 MED ORDER — LIDOCAINE HCL 1 % IJ SOLN
INTRAMUSCULAR | Status: AC | PRN
  Administered 2017-05-30: 20 mL

## 2017-05-30 MED ORDER — HEPARIN SOD (PORK) LOCK FLUSH 100 UNIT/ML IV SOLN
INTRAVENOUS | Status: AC | PRN
  Administered 2017-05-30: 500 [IU] via INTRAVENOUS

## 2017-05-30 MED ORDER — FENTANYL CITRATE (PF) 100 MCG/2ML IJ SOLN
INTRAMUSCULAR | Status: AC
  Filled 2017-05-30: qty 4

## 2017-05-30 MED ORDER — SODIUM CHLORIDE 0.9 % IV SOLN
INTRAVENOUS | Status: DC
  Administered 2017-05-30: 10:00:00 via INTRAVENOUS

## 2017-05-30 MED ORDER — LIDOCAINE HCL 1 % IJ SOLN
INTRAMUSCULAR | Status: AC
  Filled 2017-05-30: qty 20

## 2017-05-30 MED ORDER — HEPARIN SOD (PORK) LOCK FLUSH 100 UNIT/ML IV SOLN
INTRAVENOUS | Status: AC
  Filled 2017-05-30: qty 5

## 2017-05-30 MED ORDER — MIDAZOLAM HCL 2 MG/2ML IJ SOLN
INTRAMUSCULAR | Status: AC
  Filled 2017-05-30: qty 4

## 2017-05-30 NOTE — Discharge Instructions (Addendum)
Implanted Port Insertion, Care After This sheet gives you information about how to care for yourself after your procedure. Your health care provider may also give you more specific instructions. If you have problems or questions, contact your health care provider. What can I expect after the procedure? After your procedure, it is common to have:  Discomfort at the port insertion site.  Bruising on the skin over the port. This should improve over 3-4 days.  Follow these instructions at home: [facility] care  After your port is placed, you will get a manufacturer's information card. The card has information about your port. Keep this card with you at all times.  Take care of the port as told by your health care provider. Ask your health care provider if you or a family member can get training for taking care of the port at home. A home health care nurse may also take care of the port.  Make sure to remember what type of port you have. Incision care  Follow instructions from your health care provider about how to take care of your port insertion site. Make sure you: ? Wash your hands with soap and water before you change your bandage (dressing). If soap and water are not available, use hand sanitizer.  You may remove your dressing tomorrow. ? Change your dressing as told by your health care provider.   ? Leave skin glue in place. These skin closures may need to stay in place for 2 weeks or longer. If adhesive strip edges start to loosen and curl up, you may trim the loose edges. Do not remove adhesive strips completely unless your health care provider tells you to do that.  DO NOT use EMLA cream for 2 weeks as this cream will remove surgical glue on your incision.  Check your port insertion site every day for signs of infection. Check for: ? More redness, swelling, or pain. ? More fluid or blood. ? Warmth. ? Pus or a bad smell. General instructions  Do not take baths, swim, or use a hot tub  until your health care provider approves.  Do not lift anything that is heavier than 10 lb (4.5 kg) for a week, or as told by your health care provider.  Ask your health care provider when it is okay to: ? Return to work or school. ? Resume usual physical activities or sports.  Do not drive for 24 hours if you were given a medicine to help you relax (sedative).  Take over-the-counter and prescription medicines only as told by your health care provider.  Wear a medical alert bracelet in case of an emergency. This will tell any health care providers that you have a port.  Keep all follow-up visits as told by your health care provider. This is important. Contact a health care provider if:  You cannot flush your port with saline as directed, or you cannot draw blood from the port.  You have a fever or chills.  You have more redness, swelling, or pain around your port insertion site.  You have more fluid or blood coming from your port insertion site.  Your port insertion site feels warm to the touch.  You have pus or a bad smell coming from the port insertion site. Get help right away if:  You have chest pain or shortness of breath.  You have bleeding from your port that you cannot control. Summary  Take care of the port as told by your health care provider.  Change your dressing as told by your health care provider.  Keep all follow-up visits as told by your health care provider. This information is not intended to replace advice given to you by your health care provider. Make sure you discuss any questions you have with your health care provider. Document Released: 12/02/2012 Document Revised: 01/03/2016 Document Reviewed: 01/03/2016 Elsevier Interactive Patient Education  2017 [employer].

## 2017-05-30 NOTE — Telephone Encounter (Signed)
Pt called in requesting update on medications to be sent to pharmacy. Discussed with Dr. Irene Limbo. Orders to be placed. Pt to p/u from pharmacy on Monday afternoon. Pt verbalized understanding of plan.

## 2017-05-30 NOTE — Sedation Documentation (Signed)
Patient is resting comfortably with eyes closed in NAD. Snoring occassionally

## 2017-05-30 NOTE — Sedation Documentation (Signed)
Patient is resting comfortably with eyes closed snoring occasionally. MD suturing port pocket at this time

## 2017-05-30 NOTE — H&P (Signed)
Referring Physician(s): Brunetta Genera  Supervising Physician: Marybelle Killings  Patient Status:  [facility]  Chief Complaint:  "I'm here for a Port-A-Cath"  Subjective: Patient familiar to IR service from prior right retroperitoneal lymph node biopsy on 05/08/17.  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  He has poor venous access and presents today for Port-A-Cath placement for chemotherapy.  He currently denies fever, headache, chest pain, dyspnea, cough, abdominal pain, nausea, vomiting.  He does have occasional back pain and hematuria. Past Medical History:  Diagnosis Date  . BPH (benign prostatic hyperplasia)   . Cancer [facility]) 1993   Colon  . DDD (degenerative disc disease), lumbar    L4-L5  . Depression   . Glaucoma, both eyes   . Hearing loss   . Hematuria, gross   . History of adenomatous polyp of colon   . History of carpal tunnel syndrome    left  . History of colon cancer    dx 1993  s/p  hemicolectomy (malignant tumor x2)  and chemo therapy completed 1993--- no recurrence per pt  . History of shingles   . Hyperlipidemia   . Insomnia    Past Surgical History:  Procedure Laterality Date  . APPENDECTOMY    . CARPAL TUNNEL RELEASE Left 08/13/2013   Procedure: CARPAL TUNNEL RELEASE LEFT WRIST;  Surgeon: Yvette Rack., MD;  Location: [place];  Service: Orthopedics;  Laterality: Left;  . COLONOSCOPY    . CYSTOSCOPY W/ RETROGRADES Bilateral 04/03/2017   Procedure: CYSTOSCOPY WITH RETROGRADE PYELOGRAM, LEFT URETEROSCOPY, LEFT STENT PLACEMENT;  Surgeon: Franchot Gallo, MD;  Location: [facility];  Service: Urology;  Laterality: Bilateral;  . CYSTOSCOPY W/ URETERAL STENT PLACEMENT Left 04/23/2017   Procedure: CYSTOSCOPY WITH STENT REPLACEMENT;  Surgeon: Alexis Frock, MD;  Location: [facility];  Service: Urology;   Laterality: Left;  . CYSTOSCOPY WITH FULGERATION Left 04/03/2017   Procedure: LEFT URETERAL BIOPSY;  Surgeon: Franchot Gallo, MD;  Location: [facility];  Service: Urology;  Laterality: Left;  . CYSTOSCOPY/RETROGRADE/URETEROSCOPY Left 04/23/2017   Procedure: CYSTOSCOPY/RETROGRADE/URETEROSCOPY, LEFT  URETEROSCOPY WITY  BIOPSY;  Surgeon: Alexis Frock, MD;  Location: [facility];  Service: Urology;  Laterality: Left;  . HEMICOLECTOMY  1993   malignant tumor x2 , colon cancer and Appendectomy  . INGUINAL HERNIA REPAIR Right 2003  . MASTOIDECTOMY Right 2015  . SIGMOIDOSCOPY  last one 06/ 2018  . TRANSURETHRAL RESECTION OF PROSTATE N/A 09/02/2016   Procedure: TRANSURETHRAL RESECTION OF THE PROSTATE (TURP);  Surgeon: Franchot Gallo, MD;  Location: [facility];  Service: Urology;  Laterality: N/A;     Allergies: Patient has no known allergies.  Medications: Prior to Admission medications   Medication Sig Start Date End Date Taking? Authorizing Provider  clotrimazole (LOTRIMIN) 1 % cream Apply 1 application topically 2 (two) times daily as needed (for foot rash).    [provider]  diphenhydramine-acetaminophen (TYLENOL PM) 25-500 MG TABS tablet Take 2 tablets by mouth at bedtime.     [provider]  fluticasone (FLONASE) 50 MCG/ACT nasal spray Place 1 spray into both nostrils as needed.     [provider]  latanoprost (XALATAN) 0.005 % ophthalmic solution Place 1 drop into both eyes at bedtime.    [provider]  lovastatin (MEVACOR) 40 MG tablet Take 40 mg by mouth every  evening.    [provider]  sertraline (ZOLOFT) 25 MG tablet Take 25 mg by mouth every morning.     [provider]     Vital Signs: BP (!) 145/95   Pulse 66   Temp 98.3 F (36.8 C) (Oral)   Resp 18   SpO2 97%   Physical Exam awake, alert.  Chest clear to auscultation bilaterally.  Heart with regular rate and  rhythm; soft, positive bowel sounds, nontender.  No lower extremity edema.  Imaging: Nm Pet Image Initial (pi) Skull Base To Thigh  Result Date: 05/29/2017 CLINICAL DATA:  Initial treatment strategy for diffuse large B-cell lymphoma. History of prostate cancer and uroepithelial neoplasm. EXAM: NUCLEAR MEDICINE PET SKULL BASE TO THIGH TECHNIQUE: 10.75 mCi F-18 FDG was injected intravenously. Full-ring PET imaging was performed from the skull base to thigh after the radiotracer. CT data was obtained and used for attenuation correction and anatomic localization. Fasting blood glucose: 117 mg/dl COMPARISON:  CT scan 04/21/2017 FINDINGS: Mediastinal blood pool activity: SUV max 2.74 NECK: No hypermetabolic lymph nodes in the neck. Incidental CT findings: none CHEST: No hypermetabolic mediastinal or hilar nodes. No suspicious pulmonary nodules on the CT scan. No supraclavicular or axillary adenopathy. Incidental CT findings: none ABDOMEN/PELVIS: 5 cm right-sided retroperitoneal nodal mass is hypermetabolic with SUV max of 10.62. Slightly more inferiorly just below the aortic bifurcation there is a 8 mm lymph node which is hypermetabolic with SUV max of 6.94. No pelvic or inguinal adenopathy. Incidental CT findings: There is a left-sided double-J ureteral stent in place. No left-sided hydronephrosis. A large right renal cyst is stable. Mild bladder wall thickening but no obvious bladder mass. SKELETON: No focal hypermetabolic activity to suggest skeletal metastasis. Incidental CT findings: none IMPRESSION: 1. 5 cm right-sided retroperitoneal nodal mass is hypermetabolic and consistent with known lymphoma. 2. Small lymph node just below the aortic bifurcation is also hypermetabolic. 3. No adenopathy in the neck, chest, axilla, pelvis or inguinal regions. 4. No findings for osseous metastatic disease. Electronically Signed   By: Marijo Sanes M.D.   On: 05/29/2017 16:17    Labs:  CBC: Recent Labs    09/02/16 0718  04/23/17 1040 05/08/17 0908 05/15/17 1607  WBC  --   --  11.3* 9.9  HGB 15.2 13.9 14.7 14.6  HCT  --  41.0 44.6 44.1  PLT  --   --  294 257    COAGS: Recent Labs    05/08/17 0908 05/15/17 1607  INR 1.10 1.13  APTT  --  31    BMP: Recent Labs    04/23/17 1040 05/08/17 0908 05/15/17 1607  NA 143 139 139  K 3.9 4.5 4.1  CL 111 110 111*  CO2  --  19* 19*  GLUCOSE 106* 109* 92  BUN 14 16 16   CALCIUM  --  9.6 10.0  CREATININE 1.30* 1.54* 1.52*  GFRNONAA  --  40* 41*  GFRAA  --  47* 47*    LIVER FUNCTION TESTS: Recent Labs    05/08/17 0908 05/15/17 1607  BILITOT 0.5 0.3  AST 22 11  ALT 16* 13  ALKPHOS 54 62  PROT 7.8 7.9  ALBUMIN 4.0 3.9    Assessment and Plan: Pt with remote history of colon cancer, urothelial cell carcinoma diagnosed on 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  He has poor venous access and presents today for Port-A-Cath placement for chemotherapy. Risks and benefits of image guided port-a-catheter placement  was discussed with the patient/spouse including, but not limited to bleeding, infection, pneumothorax, or fibrin sheath development and need for additional procedures.  All of the patient's questions were answered, patient is agreeable to proceed. Consent signed and in chart.    Electronically Signed: D. Rowe Robert, PA-C 05/30/2017, 10:02 AM   I spent a total of 20 minutes at the the patient's bedside AND on the patient's hospital floor or unit, greater than 50% of which was counseling/coordinating care for Port-A-Cath placement

## 2017-05-30 NOTE — Telephone Encounter (Signed)
Per 4/4 no los °

## 2017-06-03 ENCOUNTER — Inpatient Hospital Stay: Payer: Medicare Other

## 2017-06-03 ENCOUNTER — Other Ambulatory Visit: Payer: Self-pay

## 2017-06-03 ENCOUNTER — Other Ambulatory Visit: Payer: Self-pay | Admitting: Hematology

## 2017-06-03 ENCOUNTER — Telehealth: Payer: Self-pay

## 2017-06-03 VITALS — BP 114/56 | HR 60 | Temp 97.9°F | Resp 17

## 2017-06-03 DIAGNOSIS — C8330 Diffuse large B-cell lymphoma, unspecified site: Secondary | ICD-10-CM

## 2017-06-03 DIAGNOSIS — C689 Malignant neoplasm of urinary organ, unspecified: Secondary | ICD-10-CM

## 2017-06-03 DIAGNOSIS — Z5111 Encounter for antineoplastic chemotherapy: Secondary | ICD-10-CM | POA: Diagnosis not present

## 2017-06-03 MED ORDER — ONDANSETRON HCL 8 MG PO TABS: 8.0000 mg | ORAL_TABLET | Freq: Two times a day (BID) | ORAL | 1 refills | Status: DC | PRN

## 2017-06-03 MED ORDER — ALLOPURINOL 300 MG PO TABS: 150.0000 mg | ORAL_TABLET | Freq: Every day | ORAL | 0 refills | Status: DC

## 2017-06-03 MED ORDER — DIPHENHYDRAMINE HCL 25 MG PO CAPS
50.0000 mg | ORAL_CAPSULE | Freq: Once | ORAL | Status: AC
  Administered 2017-06-03: 50 mg via ORAL

## 2017-06-03 MED ORDER — ACETAMINOPHEN 325 MG PO TABS
ORAL_TABLET | ORAL | Status: AC
  Filled 2017-06-03: qty 2

## 2017-06-03 MED ORDER — DEXAMETHASONE SODIUM PHOSPHATE 10 MG/ML IJ SOLN
10.0000 mg | Freq: Once | INTRAMUSCULAR | Status: AC
  Administered 2017-06-03: 10 mg via INTRAVENOUS

## 2017-06-03 MED ORDER — VINCRISTINE SULFATE CHEMO INJECTION 1 MG/ML
1.0000 mg | Freq: Once | INTRAVENOUS | Status: AC
  Administered 2017-06-03: 1 mg via INTRAVENOUS
  Filled 2017-06-03: qty 1

## 2017-06-03 MED ORDER — SODIUM CHLORIDE 0.9 % IV SOLN
Freq: Once | INTRAVENOUS | Status: AC
  Administered 2017-06-03: 10:00:00 via INTRAVENOUS

## 2017-06-03 MED ORDER — DEXAMETHASONE SODIUM PHOSPHATE 10 MG/ML IJ SOLN
INTRAMUSCULAR | Status: AC
  Filled 2017-06-03: qty 1

## 2017-06-03 MED ORDER — HEPARIN SOD (PORK) LOCK FLUSH 100 UNIT/ML IV SOLN
500.0000 [IU] | Freq: Once | INTRAVENOUS | Status: AC | PRN
Start: 2017-06-03 — End: 2017-06-03
  Administered 2017-06-03: 500 [IU]
  Filled 2017-06-03: qty 5

## 2017-06-03 MED ORDER — SODIUM CHLORIDE 0.9% FLUSH
10.0000 mL | INTRAVENOUS | Status: DC | PRN
  Administered 2017-06-03: 10 mL
  Filled 2017-06-03: qty 10

## 2017-06-03 MED ORDER — DIPHENHYDRAMINE HCL 25 MG PO CAPS
ORAL_CAPSULE | ORAL | Status: AC
  Filled 2017-06-03: qty 2

## 2017-06-03 MED ORDER — SODIUM CHLORIDE 0.9 % IV SOLN: 10.0000 mg | Freq: Once | INTRAVENOUS | Status: DC

## 2017-06-03 MED ORDER — SODIUM CHLORIDE 0.9 % IV SOLN
500.0000 mg/m2 | Freq: Once | INTRAVENOUS | Status: AC
  Administered 2017-06-03: 1100 mg via INTRAVENOUS
  Filled 2017-06-03: qty 55

## 2017-06-03 MED ORDER — LIDOCAINE-PRILOCAINE 2.5-2.5 % EX CREA: 1.0000 "application " | TOPICAL_CREAM | CUTANEOUS | 0 refills | Status: DC | PRN

## 2017-06-03 MED ORDER — ACETAMINOPHEN 325 MG PO TABS
650.0000 mg | ORAL_TABLET | Freq: Once | ORAL | Status: AC
  Administered 2017-06-03: 650 mg via ORAL

## 2017-06-03 MED ORDER — PREDNISONE 20 MG PO TABS: 60.0000 mg | ORAL_TABLET | Freq: Every day | ORAL | 6 refills | Status: DC

## 2017-06-03 MED ORDER — LORAZEPAM 0.5 MG PO TABS: 0.5000 mg | ORAL_TABLET | Freq: Four times a day (QID) | ORAL | 0 refills | Status: DC | PRN

## 2017-06-03 MED ORDER — PALONOSETRON HCL INJECTION 0.25 MG/5ML
0.2500 mg | Freq: Once | INTRAVENOUS | Status: AC
  Administered 2017-06-03: 0.25 mg via INTRAVENOUS

## 2017-06-03 MED ORDER — PALONOSETRON HCL INJECTION 0.25 MG/5ML
INTRAVENOUS | Status: AC
  Filled 2017-06-03: qty 5

## 2017-06-03 MED ORDER — RITUXIMAB CHEMO INJECTION 500 MG/50ML
375.0000 mg/m2 | Freq: Once | INTRAVENOUS | Status: AC
  Administered 2017-06-03: 800 mg via INTRAVENOUS
  Filled 2017-06-03: qty 50

## 2017-06-03 MED ORDER — DOXORUBICIN HCL CHEMO IV INJECTION 2 MG/ML
25.0000 mg/m2 | Freq: Once | INTRAVENOUS | Status: AC
  Administered 2017-06-03: 54 mg via INTRAVENOUS
  Filled 2017-06-03: qty 27

## 2017-06-03 MED ORDER — PROCHLORPERAZINE MALEATE 10 MG PO TABS: 10.0000 mg | ORAL_TABLET | Freq: Four times a day (QID) | ORAL | 6 refills | Status: DC | PRN

## 2017-06-03 NOTE — Progress Notes (Signed)
START ON PATHWAY REGIMEN - Lymphoma and CLL     A cycle is every 21 days:     Rituximab      Cyclophosphamide      Doxorubicin      Vincristine      Prednisone   **Always confirm dose/schedule in your pharmacy ordering system**    Patient Characteristics: Diffuse Large B-Cell Lymphoma, First Line, Stage I and II, Bulky Disease (10 cm or > 1/3 Diameter of Chest) Disease Type: Not Applicable Disease Type: Diffuse Large B-Cell Lymphoma Disease Type: Not Applicable Line of therapy: First Line [place] Stage: II Disease Characteristics: Bulky Disease Intent of Therapy: Curative Intent, Discussed with Patient 

## 2017-06-03 NOTE — Telephone Encounter (Signed)
Spoke with Richardson Landry, [facility] [place] and placed phone-in order for lidocaine (EMLA) cream per Dr. Irene Limbo. Readback performed.

## 2017-06-03 NOTE — Patient Instructions (Addendum)
[facility] Discharge Instructions for Patients Receiving Chemotherapy  Today you received the following chemotherapy agents Rituxan, Adriamycin, Cytoxan, Vincristine.   To help prevent nausea and vomiting after your treatment, we encourage you to take your nausea medication as directed.  If you develop nausea and vomiting that is not controlled by your nausea medication, call the clinic.   BELOW ARE SYMPTOMS THAT SHOULD BE REPORTED IMMEDIATELY:  *FEVER GREATER THAN 100.5 F  *CHILLS WITH OR WITHOUT FEVER  NAUSEA AND VOMITING THAT IS NOT CONTROLLED WITH YOUR NAUSEA MEDICATION  *UNUSUAL SHORTNESS OF BREATH  *UNUSUAL BRUISING OR BLEEDING  TENDERNESS IN MOUTH AND THROAT WITH OR WITHOUT PRESENCE OF ULCERS  *URINARY PROBLEMS  *BOWEL PROBLEMS  UNUSUAL RASH Items with * indicate a potential emergency and should be followed up as soon as possible.  Feel free to call the clinic should you have any questions or concerns. The clinic phone number is [phone].  Please show the [facility] at check-in to the Emergency Department and triage nurse.  Doxorubicin injection What is this medicine? DOXORUBICIN (dox oh ROO bi sin) is a chemotherapy drug. It is used to treat many kinds of cancer like leukemia, lymphoma, neuroblastoma, sarcoma, and Wilms' tumor. It is also used to treat bladder cancer, breast cancer, lung cancer, ovarian cancer, stomach cancer, and thyroid cancer. This medicine may be used for other purposes; ask your health care provider or pharmacist if you have questions. COMMON BRAND NAME(S): Adriamycin, Adriamycin PFS, Adriamycin RDF, Rubex What should I tell my health care provider before I take this medicine? They need to know if you have any of these conditions: -heart disease -history of low blood counts caused by a medicine -liver disease -recent or ongoing radiation therapy -an unusual or allergic reaction to doxorubicin, other chemotherapy  agents, other medicines, foods, dyes, or preservatives -pregnant or trying to get pregnant -breast-feeding How should I use this medicine? This drug is given as an infusion into a vein. It is administered in a hospital or clinic by a specially trained health care professional. If you have pain, swelling, burning or any unusual feeling around the site of your injection, tell your health care professional right away. Talk to your pediatrician regarding the use of this medicine in children. Special care may be needed. Overdosage: If you think you have taken too much of this medicine contact a poison control center or emergency room at once. NOTE: This medicine is only for you. Do not share this medicine with others. What if I miss a dose? It is important not to miss your dose. Call your doctor or health care professional if you are unable to keep an appointment. What may interact with this medicine? This medicine may interact with the following medications: -6-mercaptopurine -paclitaxel -phenytoin -St. John's Wort -trastuzumab -verapamil This list may not describe all possible interactions. Give your health care provider a list of all the medicines, herbs, non-prescription drugs, or dietary supplements you use. Also tell them if you smoke, drink alcohol, or use illegal drugs. Some items may interact with your medicine. What should I watch for while using this medicine? This drug may make you feel generally unwell. This is not uncommon, as chemotherapy can affect healthy cells as well as cancer cells. Report any side effects. Continue your course of treatment even though you feel ill unless your doctor tells you to stop. There is a maximum amount of this medicine you should receive throughout your life. The amount depends  on the medical condition being treated and your overall health. Your doctor will watch how much of this medicine you receive in your lifetime. Tell your doctor if you have taken  this medicine before. You may need blood work done while you are taking this medicine. Your urine may turn red for a few days after your dose. This is not blood. If your urine is dark or brown, call your doctor. In some cases, you may be given additional medicines to help with side effects. Follow all directions for their use. Call your doctor or health care professional for advice if you get a fever, chills or sore throat, or other symptoms of a cold or flu. Do not treat yourself. This drug decreases your body's ability to fight infections. Try to avoid being around people who are sick. This medicine may increase your risk to bruise or bleed. Call your doctor or health care professional if you notice any unusual bleeding. Talk to your doctor about your risk of cancer. You may be more at risk for certain types of cancers if you take this medicine. Do not become pregnant while taking this medicine or for 6 months after stopping it. Women should inform their doctor if they wish to become pregnant or think they might be pregnant. Men should not father a child while taking this medicine and for 6 months after stopping it. There is a potential for serious side effects to an unborn child. Talk to your health care professional or pharmacist for more information. Do not breast-feed an infant while taking this medicine. This medicine has caused ovarian failure in some women and reduced sperm counts in some men This medicine may interfere with the ability to have a child. Talk with your doctor or health care professional if you are concerned about your fertility. What side effects may I notice from receiving this medicine? Side effects that you should report to your doctor or health care professional as soon as possible: -allergic reactions like skin rash, itching or hives, swelling of the face, lips, or tongue -breathing problems -chest pain -fast or irregular heartbeat -low blood counts - this medicine may  decrease the number of white blood cells, red blood cells and platelets. You may be at increased risk for infections and bleeding. -pain, redness, or irritation at site where injected -signs of infection - fever or chills, cough, sore throat, pain or difficulty passing urine -signs of decreased platelets or bleeding - bruising, pinpoint red spots on the skin, black, tarry stools, blood in the urine -swelling of the ankles, feet, hands -tiredness -weakness Side effects that usually do not require medical attention (report to your doctor or health care professional if they continue or are bothersome): -diarrhea -hair loss -mouth sores -nail discoloration or damage -nausea -red colored urine -vomiting This list may not describe all possible side effects. Call your doctor for medical advice about side effects. You may report side effects to FDA at 1-800-FDA-1088. Where should I keep my medicine? This drug is given in a hospital or clinic and will not be stored at home. NOTE: This sheet is a summary. It may not cover all possible information. If you have questions about this medicine, talk to your doctor, pharmacist, or health care provider.  2018 Elsevier/Gold Standard (2015-04-10 11:28:51) Cyclophosphamide injection What is this medicine? CYCLOPHOSPHAMIDE (sye kloe FOSS fa mide) is a chemotherapy drug. It slows the growth of cancer cells. This medicine is used to treat many types of cancer like lymphoma,  myeloma, leukemia, breast cancer, and ovarian cancer, to name a few. This medicine may be used for other purposes; ask your health care provider or pharmacist if you have questions. COMMON BRAND NAME(S): Cytoxan, Neosar What should I tell my health care provider before I take this medicine? They need to know if you have any of these conditions: -blood disorders -history of other chemotherapy -infection -kidney disease -liver disease -recent or ongoing radiation therapy -tumors in the  bone marrow -an unusual or allergic reaction to cyclophosphamide, other chemotherapy, other medicines, foods, dyes, or preservatives -pregnant or trying to get pregnant -breast-feeding How should I use this medicine? This drug is usually given as an injection into a vein or muscle or by infusion into a vein. It is administered in a hospital or clinic by a specially trained health care professional. Talk to your pediatrician regarding the use of this medicine in children. Special care may be needed. Overdosage: If you think you have taken too much of this medicine contact a poison control center or emergency room at once. NOTE: This medicine is only for you. Do not share this medicine with others. What if I miss a dose? It is important not to miss your dose. Call your doctor or health care professional if you are unable to keep an appointment. What may interact with this medicine? This medicine may interact with the following medications: -amiodarone -amphotericin B -azathioprine -certain antiviral medicines for HIV or AIDS such as protease inhibitors (e.g., indinavir, ritonavir) and zidovudine -certain blood pressure medications such as benazepril, captopril, enalapril, fosinopril, lisinopril, moexipril, monopril, perindopril, quinapril, ramipril, trandolapril -certain cancer medications such as anthracyclines (e.g., daunorubicin, doxorubicin), busulfan, cytarabine, paclitaxel, pentostatin, tamoxifen, trastuzumab -certain diuretics such as chlorothiazide, chlorthalidone, hydrochlorothiazide, indapamide, metolazone -certain medicines that treat or prevent blood clots like warfarin -certain muscle relaxants such as succinylcholine -cyclosporine -etanercept -indomethacin -medicines to increase blood counts like filgrastim, pegfilgrastim, sargramostim -medicines used as general anesthesia -metronidazole -natalizumab This list may not describe all possible interactions. Give your health care  provider a list of all the medicines, herbs, non-prescription drugs, or dietary supplements you use. Also tell them if you smoke, drink alcohol, or use illegal drugs. Some items may interact with your medicine. What should I watch for while using this medicine? Visit your doctor for checks on your progress. This drug may make you feel generally unwell. This is not uncommon, as chemotherapy can affect healthy cells as well as cancer cells. Report any side effects. Continue your course of treatment even though you feel ill unless your doctor tells you to stop. Drink water or other fluids as directed. Urinate often, even at night. In some cases, you may be given additional medicines to help with side effects. Follow all directions for their use. Call your doctor or health care professional for advice if you get a fever, chills or sore throat, or other symptoms of a cold or flu. Do not treat yourself. This drug decreases your body's ability to fight infections. Try to avoid being around people who are sick. This medicine may increase your risk to bruise or bleed. Call your doctor or health care professional if you notice any unusual bleeding. Be careful brushing and flossing your teeth or using a toothpick because you may get an infection or bleed more easily. If you have any dental work done, tell your dentist you are receiving this medicine. You may get drowsy or dizzy. Do not drive, use machinery, or do anything that needs mental alertness  until you know how this medicine affects you. Do not become pregnant while taking this medicine or for 1 year after stopping it. Women should inform their doctor if they wish to become pregnant or think they might be pregnant. Men should not father a child while taking this medicine and for 4 months after stopping it. There is a potential for serious side effects to an unborn child. Talk to your health care professional or pharmacist for more information. Do not  breast-feed an infant while taking this medicine. This medicine may interfere with the ability to have a child. This medicine has caused ovarian failure in some women. This medicine has caused reduced sperm counts in some men. You should talk with your doctor or health care professional if you are concerned about your fertility. If you are going to have surgery, tell your doctor or health care professional that you have taken this medicine. What side effects may I notice from receiving this medicine? Side effects that you should report to your doctor or health care professional as soon as possible: -allergic reactions like skin rash, itching or hives, swelling of the face, lips, or tongue -low blood counts - this medicine may decrease the number of white blood cells, red blood cells and platelets. You may be at increased risk for infections and bleeding. -signs of infection - fever or chills, cough, sore throat, pain or difficulty passing urine -signs of decreased platelets or bleeding - bruising, pinpoint red spots on the skin, black, tarry stools, blood in the urine -signs of decreased red blood cells - unusually weak or tired, fainting spells, lightheadedness -breathing problems -dark urine -dizziness -palpitations -swelling of the ankles, feet, hands -trouble passing urine or change in the amount of urine -weight gain -yellowing of the eyes or skin Side effects that usually do not require medical attention (report to your doctor or health care professional if they continue or are bothersome): -changes in nail or skin color -hair loss -missed menstrual periods -mouth sores -nausea, vomiting This list may not describe all possible side effects. Call your doctor for medical advice about side effects. You may report side effects to FDA at 1-800-FDA-1088. Where should I keep my medicine? This drug is given in a hospital or clinic and will not be stored at home. NOTE: This sheet is a  summary. It may not cover all possible information. If you have questions about this medicine, talk to your doctor, pharmacist, or health care provider.  2018 Elsevier/Gold Standard (2011-12-27 16:22:58)  Vincristine injection What is this medicine? VINCRISTINE (vin KRIS teen) is a chemotherapy drug. It slows the growth of cancer cells. This medicine is used to treat many types of cancer like Hodgkin's disease, leukemia, non-Hodgkin's lymphoma, neuroblastoma (brain cancer), rhabdomyosarcoma, and Wilms' tumor. This medicine may be used for other purposes; ask your health care provider or pharmacist if you have questions. COMMON BRAND NAME(S): Oncovin, Vincasar PFS What should I tell my health care provider before I take this medicine? They need to know if you have any of these conditions: -blood disorders -gout -infection (especially chickenpox, cold sores, or herpes) -kidney disease -liver disease -lung disease -nervous system disease like Charcot-Marie-Tooth (CMT) -recent or ongoing radiation therapy -an unusual or allergic reaction to vincristine, other chemotherapy agents, other medicines, foods, dyes, or preservatives -pregnant or trying to get pregnant -breast-feeding How should I use this medicine? This drug is given as an infusion into a vein. It is administered in a hospital or clinic by  a specially trained health care professional. If you have pain, swelling, burning, or any unusual feeling around the site of your injection, tell your health care professional right away. Talk to your pediatrician regarding the use of this medicine in children. While this drug may be prescribed for selected conditions, precautions do apply. Overdosage: If you think you have taken too much of this medicine contact a poison control center or emergency room at once. NOTE: This medicine is only for you. Do not share this medicine with others. What if I miss a dose? It is important not to miss your  dose. Call your doctor or health care professional if you are unable to keep an appointment. What may interact with this medicine? Do not take this medicine with any of the following medications: -itraconazole -mibefradil -voriconazole This medicine may also interact with the following medications: -cyclosporine -erythromycin -fluconazole -ketoconazole -medicines for HIV like delavirdine, efavirenz, nevirapine -medicines for seizures like ethotoin, fosphenotoin, phenytoin -medicines to increase blood counts like filgrastim, pegfilgrastim, sargramostim -other chemotherapy drugs like cisplatin, L-asparaginase, methotrexate, mitomycin, paclitaxel -pegaspargase -vaccines -zalcitabine, ddC Talk to your doctor or health care professional before taking any of these medicines: -acetaminophen -aspirin -ibuprofen -ketoprofen -naproxen This list may not describe all possible interactions. Give your health care provider a list of all the medicines, herbs, non-prescription drugs, or dietary supplements you use. Also tell them if you smoke, drink alcohol, or use illegal drugs. Some items may interact with your medicine. What should I watch for while using this medicine? Your condition will be monitored carefully while you are receiving this medicine. You will need important blood work done while you are taking this medicine. This drug may make you feel generally unwell. This is not uncommon, as chemotherapy can affect healthy cells as well as cancer cells. Report any side effects. Continue your course of treatment even though you feel ill unless your doctor tells you to stop. In some cases, you may be given additional medicines to help with side effects. Follow all directions for their use. Call your doctor or health care professional for advice if you get a fever, chills or sore throat, or other symptoms of a cold or flu. Do not treat yourself. Avoid taking products that contain aspirin,  acetaminophen, ibuprofen, naproxen, or ketoprofen unless instructed by your doctor. These medicines may hide a fever. Do not become pregnant while taking this medicine. Women should inform their doctor if they wish to become pregnant or think they might be pregnant. There is a potential for serious side effects to an unborn child. Talk to your health care professional or pharmacist for more information. Do not breast-feed an infant while taking this medicine. Men may have a lower sperm count while taking this medicine. Talk to your doctor if you plan to father a child. What side effects may I notice from receiving this medicine? Side effects that you should report to your doctor or health care professional as soon as possible: -allergic reactions like skin rash, itching or hives, swelling of the face, lips, or tongue -breathing problems -confusion or changes in emotions or moods -constipation -cough -mouth sores -muscle weakness -nausea and vomiting -pain, swelling, redness or irritation at the injection site -pain, tingling, numbness in the hands or feet -problems with balance, talking, walking -seizures -stomach pain -trouble passing urine or change in the amount of urine Side effects that usually do not require medical attention (report to your doctor or health care professional if they continue or are  bothersome): -diarrhea -hair loss -jaw pain -loss of appetite This list may not describe all possible side effects. Call your doctor for medical advice about side effects. You may report side effects to FDA at 1-800-FDA-1088. Where should I keep my medicine? This drug is given in a hospital or clinic and will not be stored at home. NOTE: This sheet is a summary. It may not cover all possible information. If you have questions about this medicine, talk to your doctor, pharmacist, or health care provider.  2018 Elsevier/Gold Standard (2007-11-09 17:17:13) Rituximab injection What is  this medicine? RITUXIMAB (ri TUX i mab) is a monoclonal antibody. It is used to treat certain types of cancer like non-Hodgkin lymphoma and chronic lymphocytic leukemia. It is also used to treat rheumatoid arthritis, granulomatosis with polyangiitis (or Wegener's granulomatosis), and microscopic polyangiitis. This medicine may be used for other purposes; ask your health care provider or pharmacist if you have questions. COMMON BRAND NAME(S): Rituxan What should I tell my health care provider before I take this medicine? They need to know if you have any of these conditions: -heart disease -infection (especially a virus infection such as hepatitis B, chickenpox, cold sores, or herpes) -immune system problems -irregular heartbeat -kidney disease -lung or breathing disease, like asthma -recently received or scheduled to receive a vaccine -an unusual or allergic reaction to rituximab, mouse proteins, other medicines, foods, dyes, or preservatives -pregnant or trying to get pregnant -breast-feeding How should I use this medicine? This medicine is for infusion into a vein. It is administered in a hospital or clinic by a specially trained health care professional. A special MedGuide will be given to you by the pharmacist with each prescription and refill. Be sure to read this information carefully each time. Talk to your pediatrician regarding the use of this medicine in children. This medicine is not approved for use in children. Overdosage: If you think you have taken too much of this medicine contact a poison control center or emergency room at once. NOTE: This medicine is only for you. Do not share this medicine with others. What if I miss a dose? It is important not to miss a dose. Call your doctor or health care professional if you are unable to keep an appointment. What may interact with this medicine? -cisplatin -other medicines for arthritis like disease modifying antirheumatic drugs or  tumor necrosis factor inhibitors -live virus vaccines This list may not describe all possible interactions. Give your health care provider a list of all the medicines, herbs, non-prescription drugs, or dietary supplements you use. Also tell them if you smoke, drink alcohol, or use illegal drugs. Some items may interact with your medicine. What should I watch for while using this medicine? Your condition will be monitored carefully while you are receiving this medicine. You may need blood work done while you are taking this medicine. This medicine can cause serious allergic reactions. To reduce your risk you may need to take medicine before treatment with this medicine. Take your medicine as directed. In some patients, this medicine may cause a serious brain infection that may cause death. If you have any problems seeing, thinking, speaking, walking, or standing, tell your doctor right away. If you cannot reach your doctor, urgently seek other source of medical care. Call your doctor or health care professional for advice if you get a fever, chills or sore throat, or other symptoms of a cold or flu. Do not treat yourself. This drug decreases your body's ability to  fight infections. Try to avoid being around people who are sick. Do not become pregnant while taking this medicine or for 12 months after stopping it. Women should inform their doctor if they wish to become pregnant or think they might be pregnant. There is a potential for serious side effects to an unborn child. Talk to your health care professional or pharmacist for more information. What side effects may I notice from receiving this medicine? Side effects that you should report to your doctor or health care professional as soon as possible: -breathing problems -chest pain -dizziness or feeling faint -fast, irregular heartbeat -low blood counts - this medicine may decrease the number of white blood cells, red blood cells and platelets. You  may be at increased risk for infections and bleeding. -mouth sores -redness, blistering, peeling or loosening of the skin, including inside the mouth (this can be added for any serious or exfoliative rash that could lead to hospitalization) -signs of infection - fever or chills, cough, sore throat, pain or difficulty passing urine -signs and symptoms of kidney injury like trouble passing urine or change in the amount of urine -signs and symptoms of liver injury like dark yellow or brown urine; general ill feeling or flu-like symptoms; light-colored stools; loss of appetite; nausea; right upper belly pain; unusually weak or tired; yellowing of the eyes or skin -stomach pain -vomiting Side effects that usually do not require medical attention (report to your doctor or health care professional if they continue or are bothersome): -headache -joint pain -muscle cramps or muscle pain This list may not describe all possible side effects. Call your doctor for medical advice about side effects. You may report side effects to FDA at 1-800-FDA-1088. Where should I keep my medicine? This drug is given in a hospital or clinic and will not be stored at home. NOTE: This sheet is a summary. It may not cover all possible information. If you have questions about this medicine, talk to your doctor, pharmacist, or health care provider.  2018 Elsevier/Gold Standard (2015-09-20 15:28:09)

## 2017-06-04 ENCOUNTER — Ambulatory Visit: Payer: Medicare Other

## 2017-06-04 ENCOUNTER — Other Ambulatory Visit: Payer: Self-pay | Admitting: Hematology

## 2017-06-04 ENCOUNTER — Telehealth: Payer: Self-pay | Admitting: Hematology

## 2017-06-04 ENCOUNTER — Telehealth: Payer: Self-pay

## 2017-06-04 NOTE — Telephone Encounter (Signed)
Spoke with patient concerning added appointment on 5/8 for inf. and inj. Per 4/9 in basket

## 2017-06-04 NOTE — Telephone Encounter (Signed)
Scheduled appt per 4/10 sch msg - spoke with patient regarding appts.

## 2017-06-05 ENCOUNTER — Other Ambulatory Visit: Payer: Self-pay | Admitting: Hematology

## 2017-06-05 ENCOUNTER — Inpatient Hospital Stay: Payer: Medicare Other

## 2017-06-05 DIAGNOSIS — Z5111 Encounter for antineoplastic chemotherapy: Secondary | ICD-10-CM | POA: Diagnosis not present

## 2017-06-05 DIAGNOSIS — C8330 Diffuse large B-cell lymphoma, unspecified site: Secondary | ICD-10-CM

## 2017-06-05 MED ORDER — PEGFILGRASTIM-CBQV 6 MG/0.6ML ~~LOC~~ SOSY
PREFILLED_SYRINGE | SUBCUTANEOUS | Status: AC
  Filled 2017-06-05: qty 0.6

## 2017-06-05 MED ORDER — PEGFILGRASTIM-CBQV 6 MG/0.6ML ~~LOC~~ SOSY
6.0000 mg | PREFILLED_SYRINGE | Freq: Once | SUBCUTANEOUS | Status: AC
  Administered 2017-06-05: 6 mg via SUBCUTANEOUS

## 2017-06-05 NOTE — Patient Instructions (Signed)
Pegfilgrastim injection What is this medicine? PEGFILGRASTIM (PEG fil gra stim) is a long-acting granulocyte colony-stimulating factor that stimulates the growth of neutrophils, a type of white blood cell important in the body's fight against infection. It is used to reduce the incidence of fever and infection in patients with certain types of cancer who are receiving chemotherapy that affects the bone marrow, and to increase survival after being exposed to high doses of radiation. This medicine may be used for other purposes; ask your health care provider or pharmacist if you have questions. COMMON BRAND NAME(S): Neulasta What should I tell my health care provider before I take this medicine? They need to know if you have any of these conditions: -kidney disease -latex allergy -ongoing radiation therapy -sickle cell disease -skin reactions to acrylic adhesives (On-Body Injector only) -an unusual or allergic reaction to pegfilgrastim, filgrastim, other medicines, foods, dyes, or preservatives -pregnant or trying to get pregnant -breast-feeding How should I use this medicine? This medicine is for injection under the skin. If you get this medicine at home, you will be taught how to prepare and give the pre-filled syringe or how to use the On-body Injector. Refer to the patient Instructions for Use for detailed instructions. Use exactly as directed. Tell your healthcare provider immediately if you suspect that the On-body Injector may not have performed as intended or if you suspect the use of the On-body Injector resulted in a missed or partial dose. It is important that you put your used needles and syringes in a special sharps container. Do not put them in a trash can. If you do not have a sharps container, call your pharmacist or healthcare provider to get one. Talk to your pediatrician regarding the use of this medicine in children. While this drug may be prescribed for selected conditions,  precautions do apply. Overdosage: If you think you have taken too much of this medicine contact a poison control center or emergency room at once. NOTE: This medicine is only for you. Do not share this medicine with others. What if I miss a dose? It is important not to miss your dose. Call your doctor or health care professional if you miss your dose. If you miss a dose due to an On-body Injector failure or leakage, a new dose should be administered as soon as possible using a single prefilled syringe for manual use. What may interact with this medicine? Interactions have not been studied. Give your health care provider a list of all the medicines, herbs, non-prescription drugs, or dietary supplements you use. Also tell them if you smoke, drink alcohol, or use illegal drugs. Some items may interact with your medicine. This list may not describe all possible interactions. Give your health care provider a list of all the medicines, herbs, non-prescription drugs, or dietary supplements you use. Also tell them if you smoke, drink alcohol, or use illegal drugs. Some items may interact with your medicine. What should I watch for while using this medicine? You may need blood work done while you are taking this medicine. If you are going to need a MRI, CT scan, or other procedure, tell your doctor that you are using this medicine (On-Body Injector only). What side effects may I notice from receiving this medicine? Side effects that you should report to your doctor or health care professional as soon as possible: -allergic reactions like skin rash, itching or hives, swelling of the face, lips, or tongue -dizziness -fever -pain, redness, or irritation at site   where injected -pinpoint red spots on the skin -red or dark-brown urine -shortness of breath or breathing problems -stomach or side pain, or pain at the shoulder -swelling -tiredness -trouble passing urine or change in the amount of urine Side  effects that usually do not require medical attention (report to your doctor or health care professional if they continue or are bothersome): -bone pain -muscle pain This list may not describe all possible side effects. Call your doctor for medical advice about side effects. You may report side effects to FDA at 1-800-FDA-1088. Where should I keep my medicine? Keep out of the reach of children. Store pre-filled syringes in a refrigerator between 2 and 8 degrees C (36 and 46 degrees F). Do not freeze. Keep in carton to protect from light. Throw away this medicine if it is left out of the refrigerator for more than 48 hours. Throw away any unused medicine after the expiration date. NOTE: This sheet is a summary. It may not cover all possible information. If you have questions about this medicine, talk to your doctor, pharmacist, or health care provider.  2018 Elsevier/Gold Standard (2016-02-08 12:58:03)  

## 2017-06-13 ENCOUNTER — Telehealth: Payer: Self-pay | Admitting: Hematology and Oncology

## 2017-06-13 ENCOUNTER — Inpatient Hospital Stay (HOSPITAL_BASED_OUTPATIENT_CLINIC_OR_DEPARTMENT_OTHER): Payer: Medicare Other | Admitting: Hematology and Oncology

## 2017-06-13 ENCOUNTER — Inpatient Hospital Stay: Payer: Medicare Other

## 2017-06-13 ENCOUNTER — Encounter: Payer: Self-pay | Admitting: Hematology and Oncology

## 2017-06-13 VITALS — BP 132/80 | HR 84 | Temp 98.0°F | Resp 18 | Ht 69.0 in | Wt 209.3 lb

## 2017-06-13 DIAGNOSIS — C8330 Diffuse large B-cell lymphoma, unspecified site: Secondary | ICD-10-CM

## 2017-06-13 DIAGNOSIS — C8333 Diffuse large B-cell lymphoma, intra-abdominal lymph nodes: Secondary | ICD-10-CM

## 2017-06-13 DIAGNOSIS — Z5111 Encounter for antineoplastic chemotherapy: Secondary | ICD-10-CM | POA: Diagnosis not present

## 2017-06-13 LAB — CMP (CANCER CENTER ONLY)
ALT: 18 U/L (ref 0–55)
ANION GAP: 9 (ref 3–11)
AST: 10 U/L (ref 5–34)
Albumin: 3.4 g/dL — ABNORMAL LOW (ref 3.5–5.0)
Alkaline Phosphatase: 104 U/L (ref 40–150)
BUN: 20 mg/dL (ref 7–26)
CO2: 18 mmol/L — AB (ref 22–29)
Calcium: 9.5 mg/dL (ref 8.4–10.4)
Chloride: 112 mmol/L — ABNORMAL HIGH (ref 98–109)
Creatinine: 1.8 mg/dL — ABNORMAL HIGH (ref 0.70–1.30)
GFR, EST AFRICAN AMERICAN: 39 mL/min — AB (ref >60)
GFR, Estimated: 33 mL/min — ABNORMAL LOW (ref >60)
Glucose, Bld: 189 mg/dL — ABNORMAL HIGH (ref 70–140)
Potassium: 3.7 mmol/L (ref 3.5–5.1)
SODIUM: 139 mmol/L (ref 136–145)
TOTAL PROTEIN: 7.1 g/dL (ref 6.4–8.3)

## 2017-06-13 LAB — CBC WITH DIFFERENTIAL (CANCER CENTER ONLY)
BASOS ABS: 0.1 10*3/uL (ref 0.0–0.1)
BASOS PCT: 0 %
Eosinophils Absolute: 0.2 10*3/uL (ref 0.0–0.5)
Eosinophils Relative: 1 %
HEMATOCRIT: 42.1 % (ref 38.4–49.9)
HEMOGLOBIN: 13.8 g/dL (ref 13.0–17.1)
Lymphocytes Relative: 16 %
Lymphs Abs: 2.3 10*3/uL (ref 0.9–3.3)
MCH: 31.5 pg (ref 27.2–33.4)
MCHC: 32.8 g/dL (ref 32.0–36.0)
MCV: 96.1 fL (ref 79.3–98.0)
Monocytes Absolute: 0.6 10*3/uL (ref 0.1–0.9)
Monocytes Relative: 4 %
NEUTROS ABS: 11.2 10*3/uL — AB (ref 1.5–6.5)
NEUTROS PCT: 79 %
Platelet Count: 225 10*3/uL (ref 140–400)
RBC: 4.38 MIL/uL (ref 4.20–5.82)
RDW: 13.7 % (ref 11.0–14.6)
WBC: 14.3 10*3/uL — AB (ref 4.0–10.3)

## 2017-06-13 LAB — RETICULOCYTES
RBC.: 4.38 MIL/uL (ref 4.20–5.82)
RETIC COUNT ABSOLUTE: 78.8 10*3/uL (ref 34.8–93.9)
Retic Ct Pct: 1.8 % (ref 0.8–1.8)

## 2017-06-13 NOTE — Telephone Encounter (Signed)
Left message for patient regarding upcoming May appointments. Patient was previously scheduled per 4/9 sch message. Patient was rescheduled per 4/19 los. Due to volume in the treatment area on 4/30 , patient is scheduled 5/1 for treatment and followup visit.

## 2017-06-15 ENCOUNTER — Encounter: Payer: Self-pay | Admitting: Hematology and Oncology

## 2017-06-15 NOTE — Progress Notes (Signed)
[facility] Follow-up Visit:  Assessment: Diffuse large B-cell lymphoma ([place]) 84 y.o. Male with diagnosis of diffuse large B-cell lymphoma undergoing curative-intent systemic chemoimmunotherapy with R CHOP does reduce to account for his age.  Appears to have tolerated first cycle of systemic therapy without difficulties.  Clinical evaluation lab work demonstrates changes in the labs consistent with recent systemic therapy.  No evidence of breakthrough infections or tumor lysis syndrome.  Plan: -Return to clinic on 06/24/17: Labs, clinic visit with Dr. Irene Limbo, and next cycle of systemic chemoimmunotherapy.   Voice recognition software was used and creation of this note. Despite my best effort at editing the text, some misspelling/errors may have occurred.  Orders Placed This Encounter  Procedures  . CBC with Differential (Cancer Center Only)    Standing Status:   Future    Standing Expiration Date:   06/14/2018  . CMP ([facility] only)    Standing Status:   Future    Standing Expiration Date:   06/14/2018  . Magnesium    Standing Status:   Future    Standing Expiration Date:   06/13/2018  . Phosphorus    Standing Status:   Future    Standing Expiration Date:   06/13/2018  . Uric acid    Standing Status:   Future    Standing Expiration Date:   06/13/2018  . Lactate dehydrogenase (LDH)    Standing Status:   Future    Standing Expiration Date:   06/13/2018    Cancer Staging No matching staging information was found for the patient.  All questions were answered.  . The patient knows to call the clinic with any problems, questions or concerns.  This note was electronically signed.    History of Presenting Illness Jonathan Marshall 84 y.o. presenting to the [facility] for diagnosis of diffuse large B-cell lymphoma, recently started on curative-intent systemic chemoimmunotherapy with R-mini-CHOP  regimen, having received the first cycle of therapy on 06/03/17.   Patient denies any new symptoms since initiation of chemoimmunotherapy.  Denies any interval fevers, chills or night sweats.  No interval nausea and no peripheral neuropathy.  Oncological/hematological History:   Diffuse large B-cell lymphoma ([place])   06/02/2017 -  Chemotherapy    The patient had DOXOrubicin (ADRIAMYCIN) chemo injection 54 mg, 25 mg/m2 = 54 mg (50 % of original dose 50 mg/m2), Intravenous,  Once, 1 of 6 cycles Dose modification: 25 mg/m2 (original dose 50 mg/m2, Cycle 1, Reason: Patient Age) Administration: 54 mg (06/03/2017) palonosetron (ALOXI) injection 0.25 mg, 0.25 mg, Intravenous,  Once, 1 of 6 cycles Administration: 0.25 mg (06/03/2017) pegfilgrastim-cbqv (UDENYCA) injection 6 mg, 6 mg, Subcutaneous, Once, 1 of 6 cycles vinCRIStine (ONCOVIN) 1 mg in sodium chloride 0.9 % 50 mL chemo infusion, 1 mg (50 % of original dose 2 mg), Intravenous,  Once, 1 of 6 cycles Dose modification: 1 mg (original dose 2 mg, Cycle 1, Reason: Patient Age) Administration: 1 mg (06/03/2017) riTUXimab (RITUXAN) 800 mg in sodium chloride 0.9 % 250 mL (2.4242 mg/mL) infusion, 375 mg/m2 = 800 mg, Intravenous,  Once, 1 of 6 cycles Administration: 800 mg (06/03/2017) cyclophosphamide (CYTOXAN) 1,100 mg in sodium chloride 0.9 % 250 mL chemo infusion, 500 mg/m2 = 1,100 mg (66.7 % of original dose 750 mg/m2), Intravenous,  Once, 1 of 6 cycles Dose modification: 500 mg/m2 (original dose 750 mg/m2, Cycle 1, Reason: Patient Age) Administration: 1,100 mg (06/03/2017)  for chemotherapy treatment.       06/03/2017 Initial  Diagnosis    Diffuse large B-cell lymphoma ([place])       Medical History: Past Medical History:  Diagnosis Date  . BPH (benign prostatic hyperplasia)   . Cancer [facility]) 1993   Colon  . DDD (degenerative disc disease), lumbar    L4-L5  . Depression   . Glaucoma, both eyes   . Hearing loss   . Hematuria, gross   . History of adenomatous polyp of colon   . History of carpal tunnel syndrome     left  . History of colon cancer    dx 1993  s/p  hemicolectomy (malignant tumor x2)  and chemo therapy completed 1993--- no recurrence per pt  . History of shingles   . Hyperlipidemia   . Insomnia     Surgical History: Past Surgical History:  Procedure Laterality Date  . APPENDECTOMY    . CARPAL TUNNEL RELEASE Left 08/13/2013   Procedure: CARPAL TUNNEL RELEASE LEFT WRIST;  Surgeon: Yvette Rack., MD;  Location: [facility];  Service: Orthopedics;  Laterality: Left;  . COLONOSCOPY    . CYSTOSCOPY W/ RETROGRADES Bilateral 04/03/2017   Procedure: CYSTOSCOPY WITH RETROGRADE PYELOGRAM, LEFT URETEROSCOPY, LEFT STENT PLACEMENT;  Surgeon: Franchot Gallo, MD;  Location: [facility];  Service: Urology;  Laterality: Bilateral;  . CYSTOSCOPY W/ URETERAL STENT PLACEMENT Left 04/23/2017   Procedure: CYSTOSCOPY WITH STENT REPLACEMENT;  Surgeon: Alexis Frock, MD;  Location: [facility];  Service: Urology;  Laterality: Left;  . CYSTOSCOPY WITH FULGERATION Left 04/03/2017   Procedure: LEFT URETERAL BIOPSY;  Surgeon: Franchot Gallo, MD;  Location: [facility];  Service: Urology;  Laterality: Left;  . CYSTOSCOPY/RETROGRADE/URETEROSCOPY Left 04/23/2017   Procedure: CYSTOSCOPY/RETROGRADE/URETEROSCOPY, LEFT  URETEROSCOPY WITY  BIOPSY;  Surgeon: Alexis Frock, MD;  Location: [facility];  Service: Urology;  Laterality: Left;  . HEMICOLECTOMY  1993   malignant tumor x2 , colon cancer and Appendectomy  . INGUINAL HERNIA REPAIR Right 2003  . IR FLUORO GUIDE PORT INSERTION RIGHT  05/30/2017  . IR US GUIDE VASC ACCESS RIGHT  05/30/2017  . MASTOIDECTOMY Right 2015  . SIGMOIDOSCOPY  last one 06/ 2018  . TRANSURETHRAL RESECTION OF PROSTATE N/A 09/02/2016   Procedure: TRANSURETHRAL RESECTION OF THE PROSTATE (TURP);  Surgeon: Franchot Gallo, MD;  Location: [facility];  Service: Urology;  Laterality: N/A;    Family  History: No family history on file.  Social History: Social History   Socioeconomic History  . Marital status: Married    Spouse name: Not on file  . Number of children: Not on file  . Years of education: Not on file  . Highest education level: Not on file  Occupational History  . Not on file  Social Needs  . Financial resource strain: Not on file  . Food insecurity:    Worry: Not on file    Inability: Not on file  . Transportation needs:    Medical: Not on file    Non-medical: Not on file  Tobacco Use  . Smoking status: Never Smoker  . Smokeless tobacco: Never Used  Substance and Sexual Activity  . Alcohol use: Yes    Alcohol/week: 0.6 oz    Types: 1 Glasses of wine per week    Comment: once per week  . Drug use: No  . Sexual activity: Not on file  Lifestyle  . Physical activity:    Days per week: Not on file    Minutes per  session: Not on file  . Stress: Not on file  Relationships  . Social connections:    Talks on phone: Not on file    Gets together: Not on file    Attends religious service: Not on file    Active member of club or organization: Not on file    Attends meetings of clubs or organizations: Not on file    Relationship status: Not on file  . Intimate partner violence:    Fear of current or ex partner: Not on file    Emotionally abused: Not on file    Physically abused: Not on file    Forced sexual activity: Not on file  Other Topics Concern  . Not on file  Social History Narrative  . Not on file    Allergies: No Known Allergies  Medications:  Current Outpatient Medications  Medication Sig Dispense Refill  . allopurinol (ZYLOPRIM) 300 MG tablet Take 0.5 tablets (150 mg total) by mouth daily. 30 tablet 0  . clotrimazole (LOTRIMIN) 1 % cream Apply 1 application topically 2 (two) times daily as needed (for foot rash).    . diphenhydramine-acetaminophen (TYLENOL PM) 25-500 MG TABS tablet Take 2 tablets by mouth at bedtime.     . fluticasone  (FLONASE) 50 MCG/ACT nasal spray Place 1 spray into both nostrils as needed.     . latanoprost (XALATAN) 0.005 % ophthalmic solution Place 1 drop into both eyes at bedtime.    . lidocaine-prilocaine (EMLA) cream Apply 1 application topically as needed. For use one hour prior to port access. Place quarter-size amount over port and cover with press-n-seal. 30 g 0  . LORazepam (ATIVAN) 0.5 MG tablet Take 1 tablet (0.5 mg total) by mouth every 6 (six) hours as needed (Nausea or vomiting). 30 tablet 0  . lovastatin (MEVACOR) 40 MG tablet Take 40 mg by mouth every evening.    . ondansetron (ZOFRAN) 8 MG tablet Take 1 tablet (8 mg total) by mouth 2 (two) times daily as needed for refractory nausea / vomiting. Start on day 3 after cytoxan chemorx 30 tablet 1  . predniSONE (DELTASONE) 20 MG tablet Take 3 tablets (60 mg total) by mouth daily. Take on days 1-5 of chemotherapy. 15 tablet 6  . prochlorperazine (COMPAZINE) 10 MG tablet Take 1 tablet (10 mg total) by mouth every 6 (six) hours as needed (Nausea or vomiting). 30 tablet 6  . sertraline (ZOLOFT) 25 MG tablet Take 25 mg by mouth every morning.      No current facility-administered medications for this visit.     Review of Systems: Review of Systems  All other systems reviewed and are negative.    PHYSICAL EXAMINATION Blood pressure 132/80, pulse 84, temperature 98 F (36.7 C), temperature source Oral, resp. rate 18, height '5\' 9"'$  (1.753 m), weight 209 lb 4.8 oz (94.9 kg), SpO2 98 %.  ECOG PERFORMANCE STATUS: 0 - Asymptomatic  Physical Exam  Constitutional: He is oriented to person, place, and time. He appears well-developed and well-nourished. No distress.  HENT:  Head: Normocephalic and atraumatic.  Mouth/Throat: Oropharynx is clear and moist. No oropharyngeal exudate.  Eyes: Pupils are equal, round, and reactive to light. Conjunctivae and EOM are normal. No scleral icterus.  Neck: Normal range of motion. Neck supple. No thyromegaly  present.  Cardiovascular: Normal rate, regular rhythm and normal heart sounds.  No murmur heard. Pulmonary/Chest: Effort normal and breath sounds normal. No stridor. No respiratory distress. He has no wheezes. He has no rales.  Abdominal: Soft. Bowel sounds are normal. He exhibits no distension and no mass. There is no tenderness. There is no guarding.  Musculoskeletal: He exhibits no edema.  Lymphadenopathy:    He has no cervical adenopathy.  Neurological: He is alert and oriented to person, place, and time. He displays normal reflexes. No cranial nerve deficit or sensory deficit.  Skin: Skin is warm and dry. No rash noted. He is not diaphoretic. No erythema. No pallor.     LABORATORY DATA: I have personally reviewed the data as listed: Appointment on 06/13/2017  Component Date Value Ref Range Status  . Sodium 06/13/2017 139  136 - 145 mmol/L Final  . Potassium 06/13/2017 3.7  3.5 - 5.1 mmol/L Final  . Chloride 06/13/2017 112* 98 - 109 mmol/L Final  . CO2 06/13/2017 18* 22 - 29 mmol/L Final  . Glucose, Bld 06/13/2017 189* 70 - 140 mg/dL Final  . BUN 06/13/2017 20  7 - 26 mg/dL Final  . Creatinine 06/13/2017 1.80* 0.70 - 1.30 mg/dL Final  . Calcium 06/13/2017 9.5  8.4 - 10.4 mg/dL Final  . Total Protein 06/13/2017 7.1  6.4 - 8.3 g/dL Final  . Albumin 06/13/2017 3.4* 3.5 - 5.0 g/dL Final  . AST 06/13/2017 10  5 - 34 U/L Final  . ALT 06/13/2017 18  0 - 55 U/L Final  . Alkaline Phosphatase 06/13/2017 104  40 - 150 U/L Final  . Total Bilirubin 06/13/2017 <0.2* 0.2 - 1.2 mg/dL Final  . GFR, Est Non Af Am 06/13/2017 33* >60 mL/min Final  . GFR, Est AFR Am 06/13/2017 39* >60 mL/min Final   Comment: (NOTE) The eGFR has been calculated using the CKD EPI equation. This calculation has not been validated in all clinical situations. eGFR's persistently <60 mL/min signify possible Chronic Kidney Disease.   Georgiann Hahn gap 06/13/2017 9  3 - 11 Final   Performed at [facility], [place] [address] [place], [place] [address]  . WBC Count 06/13/2017 14.3* 4.0 - 10.3 K/uL Final  . RBC 06/13/2017 4.38  4.20 - 5.82 MIL/uL Final  . Hemoglobin 06/13/2017 13.8  13.0 - 17.1 g/dL Final  . HCT 06/13/2017 42.1  38.4 - 49.9 % Final  . MCV 06/13/2017 96.1  79.3 - 98.0 fL Final  . MCH 06/13/2017 31.5  27.2 - 33.4 pg Final  . MCHC 06/13/2017 32.8  32.0 - 36.0 g/dL Final  . RDW 06/13/2017 13.7  11.0 - 14.6 % Final  . Platelet Count 06/13/2017 225  140 - 400 K/uL Final  . Neutrophils Relative % 06/13/2017 79  % Final  . Neutro Abs 06/13/2017 11.2* 1.5 - 6.5 K/uL Final  . Lymphocytes Relative 06/13/2017 16  % Final  . Lymphs Abs 06/13/2017 2.3  0.9 - 3.3 K/uL Final  . Monocytes Relative 06/13/2017 4  % Final  . Monocytes Absolute 06/13/2017 0.6  0.1 - 0.9 K/uL Final  . Eosinophils Relative 06/13/2017 1  % Final  . Eosinophils Absolute 06/13/2017 0.2  0.0 - 0.5 K/uL Final  . Basophils Relative 06/13/2017 0  % Final  . Basophils Absolute 06/13/2017 0.1  0.0 - 0.1 K/uL Final   Performed at [facility], [place] [address] [place], [place] [address]  . Retic Ct Pct 06/13/2017 1.8  0.8 - 1.8 % Final  . RBC. 06/13/2017 4.38  4.20 - 5.82 MIL/uL Final  . Retic Count, Absolute 06/13/2017 78.8  34.8 - 93.9 K/uL Final   Performed  at [facility], [place] [address] [place], [place] [address]       Ardath Sax, MD

## 2017-06-15 NOTE — Assessment & Plan Note (Signed)
84 y.o. Male with diagnosis of diffuse large B-cell lymphoma undergoing curative-intent systemic chemoimmunotherapy with R CHOP does reduce to account for his age.  Appears to have tolerated first cycle of systemic therapy without difficulties.  Clinical evaluation lab work demonstrates changes in the labs consistent with recent systemic therapy.  No evidence of breakthrough infections or tumor lysis syndrome.  Plan: -Return to clinic on 06/24/17: Labs, clinic visit with Dr. Irene Limbo, and next cycle of systemic chemoimmunotherapy.

## 2017-06-23 ENCOUNTER — Telehealth: Payer: Self-pay

## 2017-06-23 NOTE — Telephone Encounter (Signed)
Called patient and verified appointment for inj. Per 4/29 in basket

## 2017-06-23 NOTE — Progress Notes (Signed)
HEMATOLOGY/ONCOLOGY CLINIC NOTE  Date of Service: 06/25/17  Patient Care Team: Lorene Dy, MD as PCP - General (Internal Medicine)  CHIEF COMPLAINTS/PURPOSE OF CONSULTATION:  F/u for mx of diffuse large B-Cell Lymphoma  HISTORY OF PRESENTING ILLNESS:   Jonathan Marshall is a wonderful 84 y.o. male who has been referred to [place] by urologist Dr Alexis Frock for evaluation and management of newly diagnosed diffuse large B Cell Lymphoma. He is accompanied today by his wife. The pt reports that he is doing well overall.   The pt reports that in 1993 he had colon cancer with two tumors in the ascending and descending colon, which was picked up from a routine physical. He notes that he was treated with chemotherapy with Levamisole and Leukovorin for 6 months. He has not had any issues with the colon cancer since. He continue to see Dr. Cristina Gong for his GI.   He notes that he has had blood in the urine for a year and a half, picked up in a physical. This prompted his TURP surgery in July 2018. He notes that the bleeding persisted after the surgery. He notes that a stent was placed which has significantly reduced his hematuria. In February 2019 his left uretral mass was identified and subsequently biopsied confirming adenocarcinoma. The pt notes that Dr. Tresa Moore has up to this point been considering removing one of his kidneys, which would have a bearing on our treatment.   A 04/21/17 CT Abdomen identified a right retroperitoneal mass which was biopsied on 05/08/17 which revealed Large B-Cell Lymphoma.   He notes no ongoing health problems and is without physical limitations. He denies having had any blood transfusions.   On 04/23/17 the pt had a Uretral biopsy revealing Ureter, biopsy, Left mass- SUPERFICIAL FRAGMENTS OF UROTHELIAL CARCINOMA ASSOCIATED WITH GRANULATION TISSUE AND NECROSIS.   CT Abdomen of 04/21/17 revealed large right para-aortic retroperitoneal nodal conglomeration in the abdomen,  just inferior to the right renal artery. Imaging features compatible with metastatic disease in this patient with a history of prostate and left urothelial cancer. 2. Aortic atherosclerosis.   Soft Tissue Needle/core biopsy completed on 05/08/17 with results revealing Large B-Cell Lymphoma.   Most recent lab results (05/08/17) of CBC  is as follows: all values are WNL except for WBC at 11.3k, Monocytes Abs at 1.1k, CO2 at 19, Glucose at 109, Creatinine at 1.54, ALT at 16.  On review of systems, pt reports infrequent hematuria, mild back pain associated with golfing, and denies leg swelling, flank pain, fevers, chills, night sweats and any other symptoms.   On PMHx the pt reports Colon cancer in 1993, hypercholesterolemia, glaucoma, low risk prostate cancer, mastoiditis, hernia repair, carpel tunnel. He denies heart or lung problems.  On Social Hx the pt denies smoking and ETOH consumption. On Family Hx the pt reports that his mother had breast cancer when she was roughly 19, and also cervical cancer. On his mother's side, his aunts and uncles had several cancers including lung. Maternal grandfather had prostate cancer.   Interval History:  Jonathan Marshall returns today regarding his Diffuse Large B-cell Lymphoma. [name] was on 05/29/17. The pt reports that he is doing well overall.   The pt reports that he tolerated his treatment very well overall despite some flu-like symptoms after receiving his Neulast shot, but no bone pains. He notes that he has not needed to use any of his nausea medication through C1.   He  notes that he continues to have a little blood in his urine but this has been better recently. He has a follow up with Dr. Tresa Moore in the middle of May.   In discussion of a dose escalation of his Doxorubicin, the pt notes that he is completely agreeable to this suggestion.   Lab results today (06/25/17) of CBC, CMP is as follows: all values are WNL except for  Chloride at 112, CO2 at 20, Glucose at 189, Creatinine at 1.43, Total Bilirubin at <0.2. LDH 06/25/17 is slightly low at 120 Uric Acid 06/25/17 is WNL at 5.9 Magnesium 06/25/17 is WNL at 1.9  On review of systems, pt reports blood in the urine, walking around well, eating well, staying hydrated, some hair loss, and denies abdominal pains, problems passing urine, diarrhea, constipation, nausea, vomiting, and any other symptoms.   MEDICAL HISTORY:  Past Medical History:  Diagnosis Date  . BPH (benign prostatic hyperplasia)   . Cancer [facility]) 1993   Colon  . DDD (degenerative disc disease), lumbar    L4-L5  . Depression   . Glaucoma, both eyes   . Hearing loss   . Hematuria, gross   . History of adenomatous polyp of colon   . History of carpal tunnel syndrome    left  . History of colon cancer    dx 1993  s/p  hemicolectomy (malignant tumor x2)  and chemo therapy completed 1993--- no recurrence per pt  . History of shingles   . Hyperlipidemia   . Insomnia     SURGICAL HISTORY: Past Surgical History:  Procedure Laterality Date  . APPENDECTOMY    . CARPAL TUNNEL RELEASE Left 08/13/2013   Procedure: CARPAL TUNNEL RELEASE LEFT WRIST;  Surgeon: Yvette Rack., MD;  Location: [place];  Service: Orthopedics;  Laterality: Left;  . COLONOSCOPY    . CYSTOSCOPY W/ RETROGRADES Bilateral 04/03/2017   Procedure: CYSTOSCOPY WITH RETROGRADE PYELOGRAM, LEFT URETEROSCOPY, LEFT STENT PLACEMENT;  Surgeon: Franchot Gallo, MD;  Location[facility];  Service: Urology;  Laterality: Bilateral;  . CYSTOSCOPY W/ URETERAL STENT PLACEMENT Left 04/23/2017   Procedure: CYSTOSCOPY WITH STENT REPLACEMENT;  Surgeon: Alexis Frock, MD;  Location: [facility];  Service: Urology;  Laterality: Left;  . CYSTOSCOPY WITH FULGERATION Left 04/03/2017   Procedure: LEFT URETERAL BIOPSY;  Surgeon: Franchot Gallo, MD;  Location: [facility];  Service: Urology;   Laterality: Left;  . CYSTOSCOPY/RETROGRADE/URETEROSCOPY Left 04/23/2017   Procedure: CYSTOSCOPY/RETROGRADE/URETEROSCOPY, LEFT  URETEROSCOPY WITY  BIOPSY;  Surgeon: Alexis Frock, MD;  Location: [facility];  Service: Urology;  Laterality: Left;  . HEMICOLECTOMY  1993   malignant tumor x2 , colon cancer and Appendectomy  . INGUINAL HERNIA REPAIR Right 2003  . IR FLUORO GUIDE PORT INSERTION RIGHT  05/30/2017  . IR US GUIDE VASC ACCESS RIGHT  05/30/2017  . MASTOIDECTOMY Right 2015  . SIGMOIDOSCOPY  last one 06/ 2018  . TRANSURETHRAL RESECTION OF PROSTATE N/A 09/02/2016   Procedure: TRANSURETHRAL RESECTION OF THE PROSTATE (TURP);  Surgeon: Franchot Gallo, MD;  Location: [facility];  Service: Urology;  Laterality: N/A;    SOCIAL HISTORY: Social History   Socioeconomic History  . Marital status: Married    Spouse name: Not on file  . Number of children: Not on file  . Years of education: Not on file  . Highest education level: Not on file  Occupational History  . Not on file  Social Needs  .  Financial resource strain: Not on file  . Food insecurity:    Worry: Not on file    Inability: Not on file  . Transportation needs:    Medical: Not on file    Non-medical: Not on file  Tobacco Use  . Smoking status: Never Smoker  . Smokeless tobacco: Never Used  Substance and Sexual Activity  . Alcohol use: Yes    Alcohol/week: 0.6 oz    Types: 1 Glasses of wine per week    Comment: once per week  . Drug use: No  . Sexual activity: Not on file  Lifestyle  . Physical activity:    Days per week: Not on file    Minutes per session: Not on file  . Stress: Not on file  Relationships  . Social connections:    Talks on phone: Not on file    Gets together: Not on file    Attends religious service: Not on file    Active member of club or organization: Not on file    Attends meetings of clubs or organizations: Not on file    Relationship status: Not on file  .  Intimate partner violence:    Fear of current or ex partner: Not on file    Emotionally abused: Not on file    Physically abused: Not on file    Forced sexual activity: Not on file  Other Topics Concern  . Not on file  Social History Narrative  . Not on file    FAMILY HISTORY: History reviewed. No pertinent family history.  ALLERGIES:  has No Known Allergies.  MEDICATIONS:  Current Outpatient Medications  Medication Sig Dispense Refill  . allopurinol (ZYLOPRIM) 300 MG tablet Take 0.5 tablets (150 mg total) by mouth daily. 30 tablet 0  . clotrimazole (LOTRIMIN) 1 % cream Apply 1 application topically 2 (two) times daily as needed (for foot rash).    . diphenhydramine-acetaminophen (TYLENOL PM) 25-500 MG TABS tablet Take 2 tablets by mouth at bedtime.     . fluticasone (FLONASE) 50 MCG/ACT nasal spray Place 1 spray into both nostrils as needed.     . latanoprost (XALATAN) 0.005 % ophthalmic solution Place 1 drop into both eyes at bedtime.    . lidocaine-prilocaine (EMLA) cream Apply 1 application topically as needed. For use one hour prior to port access. Place quarter-size amount over port and cover with press-n-seal. 30 g 0  . lovastatin (MEVACOR) 40 MG tablet Take 40 mg by mouth every evening.    . predniSONE (DELTASONE) 20 MG tablet Take 3 tablets (60 mg total) by mouth daily. Take on days 1-5 of chemotherapy. 15 tablet 6  . sertraline (ZOLOFT) 25 MG tablet Take 25 mg by mouth every morning.     [name] Kitchen LORazepam (ATIVAN) 0.5 MG tablet Take 1 tablet (0.5 mg total) by mouth every 6 (six) hours as needed (Nausea or vomiting). (Patient not taking: Reported on 06/25/2017) 30 tablet 0  . ondansetron (ZOFRAN) 8 MG tablet Take 1 tablet (8 mg total) by mouth 2 (two) times daily as needed for refractory nausea / vomiting. Start on day 3 after cytoxan chemorx (Patient not taking: Reported on 06/25/2017) 30 tablet 1  . prochlorperazine (COMPAZINE) 10 MG tablet Take 1 tablet (10 mg total) by mouth every 6  (six) hours as needed (Nausea or vomiting). (Patient not taking: Reported on 06/25/2017) 30 tablet 6   No current facility-administered medications for this visit.     REVIEW OF SYSTEMS:   .10  Point review of Systems was done is negative except as noted above.   PHYSICAL EXAMINATION: ECOG PERFORMANCE STATUS: 1 - Symptomatic but completely ambulatory  . Vitals:   06/25/17 1020  BP: 126/65  Pulse: 81  Resp: 18  Temp: 97.6 F (36.4 C)  SpO2: 98%   Filed Weights   06/25/17 1020  Weight: 212 lb 11.2 oz (96.5 kg)   .Body mass index is 31.41 kg/m.  [name] Kitchen GENERAL:alert, in no acute distress and comfortable SKIN: no acute rashes, no significant lesions EYES: conjunctiva are pink and non-injected, sclera anicteric OROPHARYNX: MMM, no exudates, no oropharyngeal erythema or ulceration NECK: supple, no JVD LYMPH:  no palpable lymphadenopathy in the cervical, axillary or inguinal regions LUNGS: clear to auscultation b/l with normal respiratory effort HEART: regular rate & rhythm ABDOMEN:  normoactive bowel sounds , non tender, not distended. Extremity: no pedal edema PSYCH: alert & oriented x 3 with fluent speech NEURO: no focal motor/sensory deficits    LABORATORY DATA:  I have reviewed the data as listed  . CBC Latest Ref Rng & Units 06/25/2017 06/13/2017 05/30/2017  WBC 4.0 - 10.3 K/uL 7.0 14.3(H) 9.6  Hemoglobin 13.0 - 17.1 g/dL 13.3 13.8 14.7  Hematocrit 38.4 - 49.9 % 40.9 42.1 43.4  Platelets 140 - 400 K/uL 284 225 271    . CMP Latest Ref Rng & Units 06/25/2017 06/13/2017 05/30/2017  Glucose 70 - 140 mg/dL 189(H) 189(H) 118(H)  BUN 7 - 26 mg/dL 13 20 18   Creatinine 0.70 - 1.30 mg/dL 1.43(H) 1.80(H) 1.56(H)  Sodium 136 - 145 mmol/L 139 139 140  Potassium 3.5 - 5.1 mmol/L 3.7 3.7 3.9  Chloride 98 - 109 mmol/L 112(H) 112(H) 111  CO2 22 - 29 mmol/L 20(L) 18(L) 20(L)  Calcium 8.4 - 10.4 mg/dL 9.5 9.5 9.5  Total Protein 6.4 - 8.3 g/dL 6.8 7.1 -  Total Bilirubin 0.2 - 1.2 mg/dL  <0.2(L) <0.2(L) -  Alkaline Phos 40 - 150 U/L 65 104 -  AST 5 - 34 U/L 12 10 -  ALT 0 - 55 U/L 14 18 -   Component     Latest Ref Rng & Units 05/15/2017  Retic Ct Pct     0.8 - 1.8 % 1.6  RBC.     4.20 - 5.82 MIL/uL 4.62  Retic Count, Absolute     34.8 - 93.9 K/uL 73.9  Prothrombin Time     11.4 - 15.2 seconds 14.5  INR      1.13  APTT     24 - 36 seconds 31  HIV Screen 4th Generation wRfx     Non Reactive Non Reactive  HCV Ab     0.0 - 0.9 s/co ratio <0.1  Hep B Core Ab, Tot     Negative Negative  Hepatitis B Surface Ag     Negative Negative  LDH     125 - 245 U/L 132   . Lab Results  Component Value Date   LDH 120 (L) 06/25/2017     05/08/17 Soft Tissue Needle Core Biopsy:   04/23/17 Ureter Biopsy:   RADIOGRAPHIC STUDIES: I have personally reviewed the radiological images as listed and agreed with the findings in the report. Nm Pet Image Initial (pi) Skull Base To Thigh  Result Date: 05/29/2017 CLINICAL DATA:  Initial treatment strategy for diffuse large B-cell lymphoma. History of prostate cancer and uroepithelial neoplasm. EXAM: NUCLEAR MEDICINE PET SKULL BASE TO THIGH TECHNIQUE: 10.75 mCi F-18 FDG was injected intravenously. Full-ring  PET imaging was performed from the skull base to thigh after the radiotracer. CT data was obtained and used for attenuation correction and anatomic localization. Fasting blood glucose: 117 mg/dl COMPARISON:  CT scan 04/21/2017 FINDINGS: Mediastinal blood pool activity: SUV max 2.74 NECK: No hypermetabolic lymph nodes in the neck. Incidental CT findings: none CHEST: No hypermetabolic mediastinal or hilar nodes. No suspicious pulmonary nodules on the CT scan. No supraclavicular or axillary adenopathy. Incidental CT findings: none ABDOMEN/PELVIS: 5 cm right-sided retroperitoneal nodal mass is hypermetabolic with SUV max of 93.81. Slightly more inferiorly just below the aortic bifurcation there is a 8 mm lymph node which is hypermetabolic  with SUV max of 7.88. No pelvic or inguinal adenopathy. Incidental CT findings: There is a left-sided double-J ureteral stent in place. No left-sided hydronephrosis. A large right renal cyst is stable. Mild bladder wall thickening but no obvious bladder mass. SKELETON: No focal hypermetabolic activity to suggest skeletal metastasis. Incidental CT findings: none IMPRESSION: 1. 5 cm right-sided retroperitoneal nodal mass is hypermetabolic and consistent with known lymphoma. 2. Small lymph node just below the aortic bifurcation is also hypermetabolic. 3. No adenopathy in the neck, chest, axilla, pelvis or inguinal regions. 4. No findings for osseous metastatic disease. Electronically Signed   By: Marijo Sanes M.D.   On: 05/29/2017 16:17   Ir US Guide Vasc Access Right  Result Date: 05/30/2017 CLINICAL DATA:  Ureteral carcinoma EXAM: TUNNEL POWER PORT PLACEMENT WITH SUBCUTANEOUS POCKET UTILIZING ULTRASOUND & FLOUROSCOPY FLUOROSCOPY TIME:  1 minutes period 18 mGy. MEDICATIONS AND MEDICAL HISTORY: Versed 2 mg, Fentanyl 100 mcg. Additional Medications: 2 g Ancef. Antibiotics were given within 2 hours of the procedure. ANESTHESIA/SEDATION: Moderate sedation time: 26 minutes minutes. Nursing monitored the the patient during the procedure. PROCEDURE: After written informed consent was obtained, patient was placed in the supine position on angiographic table. The right neck and chest was prepped and draped in a sterile fashion. Lidocaine was utilized for local anesthesia. The right jugular vein was noted to be patent initially with ultrasound. Under sonographic guidance, a micropuncture needle was inserted into the right IJ vein (Ultrasound and fluoroscopic image documentation was performed). The needle was removed over an 018 wire which was exchanged for a Amplatz. This was advanced into the IVC. An 8-French dilator was advanced over the Amplatz. A small incision was made in the right upper chest over the anterior right  second rib. Utilizing blunt dissection, a subcutaneous pocket was created in the caudal direction. The pocket was irrigated with a copious amount of sterile normal saline. The port catheter was tunneled from the chest incision, and out the neck incision. The reservoir was inserted into the subcutaneous pocket and secured with two 3-0 Ethilon stitches. A peel-away sheath was advanced over the Amplatz wire. The port catheter was cut to measure length and inserted through the peel-away sheath. The peel-away sheath was removed. The chest incision was closed with 3-0 Vicryl interrupted stitches for the subcutaneous tissue and a running of 4-0 Vicryl subcuticular stitch for the skin. The neck incision was closed with a 4-0 Vicryl subcuticular stitch. Derma-bond was applied to both surgical incisions. The port reservoir was flushed and instilled with heparinized saline. No complications. FINDINGS: A right IJ vein Port-A-Cath is in place with its tip at the cavoatrial junction. COMPLICATIONS: None IMPRESSION: Successful 8 French right internal jugular vein power port placement with its tip at the SVC/RA junction. Electronically Signed   By: Marybelle Killings M.D.   On: 05/30/2017  13:05   Ir Fluoro Guide Port Insertion Right  Result Date: 05/30/2017 CLINICAL DATA:  Ureteral carcinoma EXAM: TUNNEL POWER PORT PLACEMENT WITH SUBCUTANEOUS POCKET UTILIZING ULTRASOUND & FLOUROSCOPY FLUOROSCOPY TIME:  1 minutes period 18 mGy. MEDICATIONS AND MEDICAL HISTORY: Versed 2 mg, Fentanyl 100 mcg. Additional Medications: 2 g Ancef. Antibiotics were given within 2 hours of the procedure. ANESTHESIA/SEDATION: Moderate sedation time: 26 minutes minutes. Nursing monitored the the patient during the procedure. PROCEDURE: After written informed consent was obtained, patient was placed in the supine position on angiographic table. The right neck and chest was prepped and draped in a sterile fashion. Lidocaine was utilized for local anesthesia. The  right jugular vein was noted to be patent initially with ultrasound. Under sonographic guidance, a micropuncture needle was inserted into the right IJ vein (Ultrasound and fluoroscopic image documentation was performed). The needle was removed over an 018 wire which was exchanged for a Amplatz. This was advanced into the IVC. An 8-French dilator was advanced over the Amplatz. A small incision was made in the right upper chest over the anterior right second rib. Utilizing blunt dissection, a subcutaneous pocket was created in the caudal direction. The pocket was irrigated with a copious amount of sterile normal saline. The port catheter was tunneled from the chest incision, and out the neck incision. The reservoir was inserted into the subcutaneous pocket and secured with two 3-0 Ethilon stitches. A peel-away sheath was advanced over the Amplatz wire. The port catheter was cut to measure length and inserted through the peel-away sheath. The peel-away sheath was removed. The chest incision was closed with 3-0 Vicryl interrupted stitches for the subcutaneous tissue and a running of 4-0 Vicryl subcuticular stitch for the skin. The neck incision was closed with a 4-0 Vicryl subcuticular stitch. Derma-bond was applied to both surgical incisions. The port reservoir was flushed and instilled with heparinized saline. No complications. FINDINGS: A right IJ vein Port-A-Cath is in place with its tip at the cavoatrial junction. COMPLICATIONS: None IMPRESSION: Successful 8 French right internal jugular vein power port placement with its tip at the SVC/RA junction. Electronically Signed   By: Marybelle Killings M.D.   On: 05/30/2017 13:05   04/21/17 CT Abdomen   ASSESSMENT & PLAN:   84 y/o male with  1. Newly diagnosed Diffuse Large B Cell Lymphoma - Stage II -bulky PLAN Discussed pt labwork today; blood counts and chemistries are stable.  -Reviewed the 05/26/17 PET/CT which showed 1. 5 cm right-sided retroperitoneal nodal mass  is hypermetabolic and consistent with known lymphoma. Small lymph node just below the aortic bifurcation is also hypermetabolic. -Discussed that the lymphoma staging is  II -bulky -Discussed that the treatment of lymphoma is more pressing at this time -ECHO shows normal EF. -- will plan to treat with R-mini CHOP with G-CSF support for 3-4 cycles followed by surgery for urothelial cancer followed by +/- 2 additional cycles of Ctx +/- ISRT. -previous discussed 05/08/17 Biopsy revealed Large B Cell Lymphoma of the right para-aortic retroperitoneal nodal conglomeration in the abdomen.  -Reviewed that the patient's 05/28/17 ECHO showed normal EF -Prednisone for 5 days each cycle.  -Neulasta on D2 or D3 of each cycle. --Discussed pt labwork today, 06/25/17; CBC is WNL and electrolytes continue to be balanced.  -Discussed Doxorubicin dose escalation (37.5mg /m2) with pt, in view of his BM reserves, tolerance of first treatment, and to most effectively treat his lymphoma and reasonably control his urothelial cancer.  -Discussed continuing through C3, followed by  PET, if significant response is realized, it would be reasonable to pursue urothelial surgery, followed by 2-3 more cycles after successful post-surgical healing.  -Advised that the pt continue staying well hydrated, well fed, and walk 20-30 minutes each day.  -PET/CT in 5 weeks, at 2 weeks out from C3  2. 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  -Surgery for the uretral mass will likely best be saved until after treatment of his lymphoma.  -The patient has had hematuria for a year and a half, which may indicate a fairly well behaved tumor and the ability to confidently prioritize his lymphoma which would be rapidly progressive without treatment. -plan as above -continue urologic f/u with Dr Tresa Moore.   Please schedule C3 of R-CHOP in 3 weeks RTC with Dr Irene Limbo in 3 weeks  with labs with C3D1 PET/CT in 5 weeks    All of the patients questions were answered with apparent satisfaction. The patient knows to call the clinic with any problems, questions or concerns.  . The total time spent in the appointment was 25 minutes and more than 50% was on counseling and direct patient cares.    Sullivan Lone MD [place] AAHIVMS [facility] Hematology/Oncology Physician [facility]  (Office):       [phone] (Work cell):  [phone] (Fax):           [phone]  06/25/2017 10:29 AM  This document serves as a record of services personally performed by Sullivan Lone, MD. It was created on his behalf by Baldwin Jamaica, a trained medical scribe. The creation of this record is based on the scribe's personal observations and the provider's statements to them.   .I have reviewed the above documentation for accuracy and completeness, and I agree with the above. Brunetta Genera MD MS

## 2017-06-24 ENCOUNTER — Other Ambulatory Visit: Payer: Medicare Other

## 2017-06-24 ENCOUNTER — Ambulatory Visit: Payer: Medicare Other

## 2017-06-24 ENCOUNTER — Ambulatory Visit: Payer: Medicare Other | Admitting: Hematology

## 2017-06-25 ENCOUNTER — Inpatient Hospital Stay: Payer: Medicare Other

## 2017-06-25 ENCOUNTER — Inpatient Hospital Stay: Payer: Medicare Other | Attending: Hematology | Admitting: Hematology

## 2017-06-25 ENCOUNTER — Encounter: Payer: Self-pay | Admitting: Hematology

## 2017-06-25 VITALS — BP 123/60 | HR 66 | Temp 97.0°F | Resp 16

## 2017-06-25 VITALS — BP 126/65 | HR 81 | Temp 97.6°F | Resp 18 | Ht 69.0 in | Wt 212.7 lb

## 2017-06-25 DIAGNOSIS — Z5189 Encounter for other specified aftercare: Secondary | ICD-10-CM | POA: Diagnosis not present

## 2017-06-25 DIAGNOSIS — Z5111 Encounter for antineoplastic chemotherapy: Secondary | ICD-10-CM | POA: Diagnosis not present

## 2017-06-25 DIAGNOSIS — C8330 Diffuse large B-cell lymphoma, unspecified site: Secondary | ICD-10-CM

## 2017-06-25 DIAGNOSIS — Z5112 Encounter for antineoplastic immunotherapy: Secondary | ICD-10-CM | POA: Diagnosis present

## 2017-06-25 DIAGNOSIS — C662 Malignant neoplasm of left ureter: Secondary | ICD-10-CM | POA: Diagnosis not present

## 2017-06-25 DIAGNOSIS — C8333 Diffuse large B-cell lymphoma, intra-abdominal lymph nodes: Secondary | ICD-10-CM | POA: Diagnosis not present

## 2017-06-25 DIAGNOSIS — Z85038 Personal history of other malignant neoplasm of large intestine: Secondary | ICD-10-CM | POA: Diagnosis not present

## 2017-06-25 LAB — CBC WITH DIFFERENTIAL (CANCER CENTER ONLY)
BASOS ABS: 0 10*3/uL (ref 0.0–0.1)
BASOS PCT: 0 %
EOS ABS: 0.1 10*3/uL (ref 0.0–0.5)
EOS PCT: 1 %
HCT: 40.9 % (ref 38.4–49.9)
HEMOGLOBIN: 13.3 g/dL (ref 13.0–17.1)
LYMPHS ABS: 1.8 10*3/uL (ref 0.9–3.3)
Lymphocytes Relative: 26 %
MCH: 31.2 pg (ref 27.2–33.4)
MCHC: 32.5 g/dL (ref 32.0–36.0)
MCV: 96 fL (ref 79.3–98.0)
Monocytes Absolute: 0.5 10*3/uL (ref 0.1–0.9)
Monocytes Relative: 8 %
NEUTROS PCT: 65 %
Neutro Abs: 4.5 10*3/uL (ref 1.5–6.5)
PLATELETS: 284 10*3/uL (ref 140–400)
RBC: 4.26 MIL/uL (ref 4.20–5.82)
RDW: 14.2 % (ref 11.0–14.6)
WBC: 7 10*3/uL (ref 4.0–10.3)

## 2017-06-25 LAB — CMP (CANCER CENTER ONLY)
ALBUMIN: 3.5 g/dL (ref 3.5–5.0)
ALK PHOS: 65 U/L (ref 40–150)
ALT: 14 U/L (ref 0–55)
AST: 12 U/L (ref 5–34)
Anion gap: 7 (ref 3–11)
BUN: 13 mg/dL (ref 7–26)
CALCIUM: 9.5 mg/dL (ref 8.4–10.4)
CHLORIDE: 112 mmol/L — AB (ref 98–109)
CO2: 20 mmol/L — AB (ref 22–29)
CREATININE: 1.43 mg/dL — AB (ref 0.70–1.30)
GFR, Est AFR Am: 51 mL/min — ABNORMAL LOW (ref >60)
GFR, Estimated: 44 mL/min — ABNORMAL LOW (ref >60)
Glucose, Bld: 189 mg/dL — ABNORMAL HIGH (ref 70–140)
Potassium: 3.7 mmol/L (ref 3.5–5.1)
SODIUM: 139 mmol/L (ref 136–145)
Total Bilirubin: <0.2 mg/dL — ABNORMAL LOW (ref 0.2–1.2)
Total Protein: 6.8 g/dL (ref 6.4–8.3)

## 2017-06-25 LAB — MAGNESIUM: Magnesium: 1.9 mg/dL (ref 1.7–2.4)

## 2017-06-25 LAB — LACTATE DEHYDROGENASE: LDH: 120 U/L — ABNORMAL LOW (ref 125–245)

## 2017-06-25 LAB — PHOSPHORUS: Phosphorus: 2.1 mg/dL — ABNORMAL LOW (ref 2.5–4.6)

## 2017-06-25 LAB — URIC ACID: Uric Acid, Serum: 5.9 mg/dL (ref 2.6–7.4)

## 2017-06-25 MED ORDER — SODIUM CHLORIDE 0.9 % IV SOLN
375.0000 mg/m2 | Freq: Once | INTRAVENOUS | Status: AC
  Administered 2017-06-25: 800 mg via INTRAVENOUS
  Filled 2017-06-25: qty 50

## 2017-06-25 MED ORDER — DEXAMETHASONE SODIUM PHOSPHATE 10 MG/ML IJ SOLN
INTRAMUSCULAR | Status: AC
Start: 2017-06-25 — End: ?
  Filled 2017-06-25: qty 1

## 2017-06-25 MED ORDER — ACETAMINOPHEN 325 MG PO TABS
650.0000 mg | ORAL_TABLET | Freq: Once | ORAL | Status: AC
  Administered 2017-06-25: 650 mg via ORAL

## 2017-06-25 MED ORDER — ACETAMINOPHEN 325 MG PO TABS
ORAL_TABLET | ORAL | Status: AC
  Filled 2017-06-25: qty 2

## 2017-06-25 MED ORDER — DEXAMETHASONE SODIUM PHOSPHATE 10 MG/ML IJ SOLN
10.0000 mg | Freq: Once | INTRAMUSCULAR | Status: AC
  Administered 2017-06-25: 10 mg via INTRAVENOUS

## 2017-06-25 MED ORDER — PALONOSETRON HCL INJECTION 0.25 MG/5ML
0.2500 mg | Freq: Once | INTRAVENOUS | Status: AC
  Administered 2017-06-25: 0.25 mg via INTRAVENOUS

## 2017-06-25 MED ORDER — PALONOSETRON HCL INJECTION 0.25 MG/5ML
INTRAVENOUS | Status: AC
  Filled 2017-06-25: qty 5

## 2017-06-25 MED ORDER — DIPHENHYDRAMINE HCL 25 MG PO CAPS
ORAL_CAPSULE | ORAL | Status: AC
  Filled 2017-06-25: qty 2

## 2017-06-25 MED ORDER — DIPHENHYDRAMINE HCL 25 MG PO CAPS
50.0000 mg | ORAL_CAPSULE | Freq: Once | ORAL | Status: AC
  Administered 2017-06-25: 50 mg via ORAL

## 2017-06-25 MED ORDER — SODIUM CHLORIDE 0.9 % IV SOLN
Freq: Once | INTRAVENOUS | Status: AC
  Administered 2017-06-25: 11:00:00 via INTRAVENOUS

## 2017-06-25 MED ORDER — DOXORUBICIN HCL CHEMO IV INJECTION 2 MG/ML
37.5000 mg/m2 | Freq: Once | INTRAVENOUS | Status: AC
  Administered 2017-06-25: 82 mg via INTRAVENOUS
  Filled 2017-06-25: qty 41

## 2017-06-25 MED ORDER — HEPARIN SOD (PORK) LOCK FLUSH 100 UNIT/ML IV SOLN
500.0000 [IU] | Freq: Once | INTRAVENOUS | Status: AC | PRN
  Administered 2017-06-25: 500 [IU]
  Filled 2017-06-25: qty 5

## 2017-06-25 MED ORDER — VINCRISTINE SULFATE CHEMO INJECTION 1 MG/ML
1.0000 mg | Freq: Once | INTRAVENOUS | Status: AC
  Administered 2017-06-25: 1 mg via INTRAVENOUS
  Filled 2017-06-25: qty 1

## 2017-06-25 MED ORDER — SODIUM CHLORIDE 0.9 % IV SOLN: 375.0000 mg/m2 | Freq: Once | INTRAVENOUS | Status: DC

## 2017-06-25 MED ORDER — SODIUM CHLORIDE 0.9 % IV SOLN
500.0000 mg/m2 | Freq: Once | INTRAVENOUS | Status: AC
  Administered 2017-06-25: 1100 mg via INTRAVENOUS
  Filled 2017-06-25: qty 55

## 2017-06-25 MED ORDER — SODIUM CHLORIDE 0.9% FLUSH
10.0000 mL | INTRAVENOUS | Status: DC | PRN
  Administered 2017-06-25: 10 mL
  Filled 2017-06-25: qty 10

## 2017-06-25 NOTE — Progress Notes (Signed)
Per Dr. Irene Limbo, doxorubicin dose increased to 37.5 mg/m2. Colleen, [facility] and infusion room RN notified of change.

## 2017-06-25 NOTE — Patient Instructions (Signed)
[facility] Discharge Instructions for Patients Receiving Chemotherapy  Today you received the following chemotherapy agents:  Adriamycin, Vincristine, Cytoxan, and Rituxan.  To help prevent nausea and vomiting after your treatment, we encourage you to take your nausea medication as directed.   If you develop nausea and vomiting that is not controlled by your nausea medication, call the clinic.   BELOW ARE SYMPTOMS THAT SHOULD BE REPORTED IMMEDIATELY:  *FEVER GREATER THAN 100.5 F  *CHILLS WITH OR WITHOUT FEVER  NAUSEA AND VOMITING THAT IS NOT CONTROLLED WITH YOUR NAUSEA MEDICATION  *UNUSUAL SHORTNESS OF BREATH  *UNUSUAL BRUISING OR BLEEDING  TENDERNESS IN MOUTH AND THROAT WITH OR WITHOUT PRESENCE OF ULCERS  *URINARY PROBLEMS  *BOWEL PROBLEMS  UNUSUAL RASH Items with * indicate a potential emergency and should be followed up as soon as possible.  Feel free to call the clinic should you have any questions or concerns. The clinic phone number is [phone].  Please show the CHEMO ALERT CARD at check-in to the Emergency Department and triage nurse.   

## 2017-06-27 ENCOUNTER — Inpatient Hospital Stay: Payer: Medicare Other

## 2017-06-27 VITALS — BP 129/75 | HR 67 | Temp 98.0°F | Resp 18

## 2017-06-27 DIAGNOSIS — Z5111 Encounter for antineoplastic chemotherapy: Secondary | ICD-10-CM | POA: Diagnosis not present

## 2017-06-27 DIAGNOSIS — C8330 Diffuse large B-cell lymphoma, unspecified site: Secondary | ICD-10-CM

## 2017-06-27 MED ORDER — PEGFILGRASTIM-CBQV 6 MG/0.6ML ~~LOC~~ SOSY
6.0000 mg | PREFILLED_SYRINGE | Freq: Once | SUBCUTANEOUS | Status: AC
  Administered 2017-06-27: 6 mg via SUBCUTANEOUS

## 2017-06-27 MED ORDER — PEGFILGRASTIM-CBQV 6 MG/0.6ML ~~LOC~~ SOSY
PREFILLED_SYRINGE | SUBCUTANEOUS | Status: AC
  Filled 2017-06-27: qty 0.6

## 2017-07-01 ENCOUNTER — Ambulatory Visit: Payer: Medicare Other

## 2017-07-02 ENCOUNTER — Ambulatory Visit: Payer: Medicare Other

## 2017-07-03 ENCOUNTER — Telehealth: Payer: Self-pay | Admitting: Hematology

## 2017-07-03 NOTE — Telephone Encounter (Signed)
Appointments scheduled and left message for patient. Letter/calendar mailed to patient per 5/1 los

## 2017-07-09 ENCOUNTER — Telehealth: Payer: Self-pay | Admitting: Hematology

## 2017-07-09 NOTE — Telephone Encounter (Signed)
Patient called to confirm appointment for 5/22

## 2017-07-14 NOTE — Progress Notes (Signed)
HEMATOLOGY/ONCOLOGY CLINIC NOTE  Date of Service: 07/16/17  Patient Care Team: Lorene Dy, MD as PCP - General (Internal Medicine)  CHIEF COMPLAINTS/PURPOSE OF CONSULTATION:  F/u for mx of diffuse large B-Cell Lymphoma  HISTORY OF PRESENTING ILLNESS:   Jonathan Marshall is a wonderful 84 y.o. male who has been referred to [place] by urologist Dr Alexis Frock for evaluation and management of newly diagnosed diffuse large B Cell Lymphoma. He is accompanied today by his wife. The pt reports that he is doing well overall.   The pt reports that in 1993 he had colon cancer with two tumors in the ascending and descending colon, which was picked up from a routine physical. He notes that he was treated with chemotherapy with Levamisole and Leukovorin for 6 months. He has not had any issues with the colon cancer since. He continue to see Dr. Cristina Gong for his GI.   He notes that he has had blood in the urine for a year and a half, picked up in a physical. This prompted his TURP surgery in July 2018. He notes that the bleeding persisted after the surgery. He notes that a stent was placed which has significantly reduced his hematuria. In February 2019 his left uretral mass was identified and subsequently biopsied confirming adenocarcinoma. The pt notes that Dr. Tresa Moore has up to this point been considering removing one of his kidneys, which would have a bearing on our treatment.   A 04/21/17 CT Abdomen identified a right retroperitoneal mass which was biopsied on 05/08/17 which revealed Large B-Cell Lymphoma.   He notes no ongoing health problems and is without physical limitations. He denies having had any blood transfusions.   On 04/23/17 the pt had a Uretral biopsy revealing Ureter, biopsy, Left mass- SUPERFICIAL FRAGMENTS OF UROTHELIAL CARCINOMA ASSOCIATED WITH GRANULATION TISSUE AND NECROSIS.   CT Abdomen of 04/21/17 revealed large right para-aortic retroperitoneal nodal conglomeration in the abdomen,  just inferior to the right renal artery. Imaging features compatible with metastatic disease in this patient with a history of prostate and left urothelial cancer. 2. Aortic atherosclerosis.   Soft Tissue Needle/core biopsy completed on 05/08/17 with results revealing Large B-Cell Lymphoma.   Most recent lab results (05/08/17) of CBC  is as follows: all values are WNL except for WBC at 11.3k, Monocytes Abs at 1.1k, CO2 at 19, Glucose at 109, Creatinine at 1.54, ALT at 16.  On review of systems, pt reports infrequent hematuria, mild back pain associated with golfing, and denies leg swelling, flank pain, fevers, chills, night sweats and any other symptoms.   On PMHx the pt reports Colon cancer in 1993, hypercholesterolemia, glaucoma, low risk prostate cancer, mastoiditis, hernia repair, carpel tunnel. He denies heart or lung problems.  On Social Hx the pt denies smoking and ETOH consumption. On Family Hx the pt reports that his mother had breast cancer when she was roughly 49, and also cervical cancer. On his mother's side, his aunts and uncles had several cancers including lung. Maternal grandfather had prostate cancer.   Interval History:  Mr. [name] returns today regarding his Diffuse Large B-cell Lymphoma. [name] was on 06/25/17. The pt reports that he is doing well overall. His PET/CT is scheduled for June 3. His next visit with Dr Tresa Moore is July 9.   The pt reports that he tolerated the increased dose of Doxorubicin very well. He notes that he continues to have intermittent hematuria, which has not worsened. He  also notes some discomfort in his pelvis and has passed a few blood clots in his urine.   He has also returned to walking daily and has continued to eat very well. He denies any problems with his port at this time.   Lab results today (07/16/17) of CBC and Reticulocytes is as follows: all values are WNL except for RBC at 4.12, Hgb at 12.9, RDW at 15.5. CMP  07/16/17 shows all values WNL except for Chloride at 110, Glucose at 167, Creatinine at 1.56.  LDH 07/16/17 is . Lab Results  Component Value Date   LDH 124 (L) 07/16/2017    On review of systems, pt reports eating well, stable appetite, intermittent hematuria, occasional pelvic discomfort while urinating,  and denies abdominal pains, fevers, chills, mouth sores, and any other symptoms.   MEDICAL HISTORY:  Past Medical History:  Diagnosis Date  . BPH (benign prostatic hyperplasia)   . Cancer [facility]) 1993   Colon  . DDD (degenerative disc disease), lumbar    L4-L5  . Depression   . Glaucoma, both eyes   . Hearing loss   . Hematuria, gross   . History of adenomatous polyp of colon   . History of carpal tunnel syndrome    left  . History of colon cancer    dx 1993  s/p  hemicolectomy (malignant tumor x2)  and chemo therapy completed 1993--- no recurrence per pt  . History of shingles   . Hyperlipidemia   . Insomnia     SURGICAL HISTORY: Past Surgical History:  Procedure Laterality Date  . APPENDECTOMY    . CARPAL TUNNEL RELEASE Left 08/13/2013   Procedure: CARPAL TUNNEL RELEASE LEFT WRIST;  Surgeon: Yvette Rack., MD;  Location: [place];  Service: Orthopedics;  Laterality: Left;  . COLONOSCOPY    . CYSTOSCOPY W/ RETROGRADES Bilateral 04/03/2017   Procedure: CYSTOSCOPY WITH RETROGRADE PYELOGRAM, LEFT URETEROSCOPY, LEFT STENT PLACEMENT;  Surgeon: Franchot Gallo, MD;  Location: [facility];  Service: Urology;  Laterality: Bilateral;  . CYSTOSCOPY W/ URETERAL STENT PLACEMENT Left 04/23/2017   Procedure: CYSTOSCOPY WITH STENT REPLACEMENT;  Surgeon: Alexis Frock, MD;  Location: [facility];  Service: Urology;  Laterality: Left;  . CYSTOSCOPY WITH FULGERATION Left 04/03/2017   Procedure: LEFT URETERAL BIOPSY;  Surgeon: Franchot Gallo, MD;  Location: [facility];  Service: Urology;  Laterality: Left;  .  CYSTOSCOPY/RETROGRADE/URETEROSCOPY Left 04/23/2017   Procedure: CYSTOSCOPY/RETROGRADE/URETEROSCOPY, LEFT  URETEROSCOPY WITY  BIOPSY;  Surgeon: Alexis Frock, MD;  Location: [facility];  Service: Urology;  Laterality: Left;  . HEMICOLECTOMY  1993   malignant tumor x2 , colon cancer and Appendectomy  . INGUINAL HERNIA REPAIR Right 2003  . IR FLUORO GUIDE PORT INSERTION RIGHT  05/30/2017  . IR US GUIDE VASC ACCESS RIGHT  05/30/2017  . MASTOIDECTOMY Right 2015  . SIGMOIDOSCOPY  last one 06/ 2018  . TRANSURETHRAL RESECTION OF PROSTATE N/A 09/02/2016   Procedure: TRANSURETHRAL RESECTION OF THE PROSTATE (TURP);  Surgeon: Franchot Gallo, MD;  Location: [facility];  Service: Urology;  Laterality: N/A;    SOCIAL HISTORY: Social History   Socioeconomic History  . Marital status: Married    Spouse name: Not on file  . Number of children: Not on file  . Years of education: Not on file  . Highest education level: Not on file  Occupational History  . Not on file  Social Needs  . Financial resource strain: Not  on file  . Food insecurity:    Worry: Not on file    Inability: Not on file  . Transportation needs:    Medical: Not on file    Non-medical: Not on file  Tobacco Use  . Smoking status: Never Smoker  . Smokeless tobacco: Never Used  Substance and Sexual Activity  . Alcohol use: Yes    Alcohol/week: 0.6 oz    Types: 1 Glasses of wine per week    Comment: once per week  . Drug use: No  . Sexual activity: Not on file  Lifestyle  . Physical activity:    Days per week: Not on file    Minutes per session: Not on file  . Stress: Not on file  Relationships  . Social connections:    Talks on phone: Not on file    Gets together: Not on file    Attends religious service: Not on file    Active member of club or organization: Not on file    Attends meetings of clubs or organizations: Not on file    Relationship status: Not on file  . Intimate partner  violence:    Fear of current or ex partner: Not on file    Emotionally abused: Not on file    Physically abused: Not on file    Forced sexual activity: Not on file  Other Topics Concern  . Not on file  Social History Narrative  . Not on file    FAMILY HISTORY: No family history on file.  ALLERGIES:  has No Known Allergies.  MEDICATIONS:  Current Outpatient Medications  Medication Sig Dispense Refill  . allopurinol (ZYLOPRIM) 300 MG tablet Take 0.5 tablets (150 mg total) by mouth daily. 30 tablet 0  . clotrimazole (LOTRIMIN) 1 % cream Apply 1 application topically 2 (two) times daily as needed (for foot rash).    . diphenhydramine-acetaminophen (TYLENOL PM) 25-500 MG TABS tablet Take 2 tablets by mouth at bedtime.     . fluticasone (FLONASE) 50 MCG/ACT nasal spray Place 1 spray into both nostrils as needed.     . latanoprost (XALATAN) 0.005 % ophthalmic solution Place 1 drop into both eyes at bedtime.    . lidocaine-prilocaine (EMLA) cream Apply 1 application topically as needed. For use one hour prior to port access. Place quarter-size amount over port and cover with press-n-seal. 30 g 0  . LORazepam (ATIVAN) 0.5 MG tablet Take 1 tablet (0.5 mg total) by mouth every 6 (six) hours as needed (Nausea or vomiting). 30 tablet 0  . lovastatin (MEVACOR) 40 MG tablet Take 40 mg by mouth every evening.    . ondansetron (ZOFRAN) 8 MG tablet Take 1 tablet (8 mg total) by mouth 2 (two) times daily as needed for refractory nausea / vomiting. Start on day 3 after cytoxan chemorx 30 tablet 1  . predniSONE (DELTASONE) 20 MG tablet Take 3 tablets (60 mg total) by mouth daily. Take on days 1-5 of chemotherapy. 15 tablet 6  . prochlorperazine (COMPAZINE) 10 MG tablet Take 1 tablet (10 mg total) by mouth every 6 (six) hours as needed (Nausea or vomiting). 30 tablet 6  . sertraline (ZOLOFT) 25 MG tablet Take 25 mg by mouth every morning.      No current facility-administered medications for this visit.       REVIEW OF SYSTEMS:   A 10+ POINT REVIEW OF SYSTEMS WAS OBTAINED including neurology, dermatology, psychiatry, cardiac, respiratory, lymph, extremities, GI, GU, Musculoskeletal, constitutional, breasts, reproductive, HEENT.  All pertinent positives are noted in the HPI.  All others are negative.    PHYSICAL EXAMINATION: ECOG PERFORMANCE STATUS: 1 - Symptomatic but completely ambulatory  . Vitals:   07/16/17 0841  BP: 127/67  Pulse: 67  Resp: 18  Temp: 97.9 F (36.6 C)  SpO2: 97%   Filed Weights   07/16/17 0841  Weight: 210 lb 3.2 oz (95.3 kg)   .Body mass index is 31.04 kg/m.  GENERAL:alert, in no acute distress and comfortable SKIN: no acute rashes, no significant lesions EYES: conjunctiva are pink and non-injected, sclera anicteric OROPHARYNX: MMM, no exudates, no oropharyngeal erythema or ulceration NECK: supple, no JVD LYMPH:  no palpable lymphadenopathy in the cervical, axillary or inguinal regions LUNGS: clear to auscultation b/l with normal respiratory effort HEART: regular rate & rhythm ABDOMEN:  normoactive bowel sounds , non tender, not distended. Extremity: no pedal edema PSYCH: alert & oriented x 3 with fluent speech NEURO: no focal motor/sensory deficits   LABORATORY DATA:  I have reviewed the data as listed  . CBC Latest Ref Rng & Units 07/16/2017 06/25/2017 06/13/2017  WBC 4.0 - 10.3 K/uL 6.9 7.0 14.3(H)  Hemoglobin 13.0 - 17.1 g/dL 12.9(L) 13.3 13.8  Hematocrit 38.4 - 49.9 % 39.8 40.9 42.1  Platelets 140 - 400 K/uL 271 284 225    . CMP Latest Ref Rng & Units 07/16/2017 06/25/2017 06/13/2017  Glucose 70 - 140 mg/dL 167(H) 189(H) 189(H)  BUN 7 - 26 mg/dL 13 13 20   Creatinine 0.70 - 1.30 mg/dL 1.56(H) 1.43(H) 1.80(H)  Sodium 136 - 145 mmol/L 141 139 139  Potassium 3.5 - 5.1 mmol/L 3.7 3.7 3.7  Chloride 98 - 109 mmol/L 110(H) 112(H) 112(H)  CO2 22 - 29 mmol/L 22 20(L) 18(L)  Calcium 8.4 - 10.4 mg/dL 9.5 9.5 9.5  Total Protein 6.4 - 8.3 g/dL 6.5 6.8  7.1  Total Bilirubin 0.2 - 1.2 mg/dL 0.2 <0.2(L) <0.2(L)  Alkaline Phos 40 - 150 U/L 62 65 104  AST 5 - 34 U/L 12 12 10   ALT 0 - 55 U/L 16 14 18    Component     Latest Ref Rng & Units 05/15/2017  Retic Ct Pct     0.8 - 1.8 % 1.6  RBC.     4.20 - 5.82 MIL/uL 4.62  Retic Count, Absolute     34.8 - 93.9 K/uL 73.9  Prothrombin Time     11.4 - 15.2 seconds 14.5  INR      1.13  APTT     24 - 36 seconds 31  HIV Screen 4th Generation wRfx     Non Reactive Non Reactive  HCV Ab     0.0 - 0.9 s/co ratio <0.1  Hep B Core Ab, Tot     Negative Negative  Hepatitis B Surface Ag     Negative Negative  LDH     125 - 245 U/L 132   . Lab Results  Component Value Date   LDH 124 (L) 07/16/2017     05/08/17 Soft Tissue Needle Core Biopsy:   04/23/17 Ureter Biopsy:   RADIOGRAPHIC STUDIES: I have personally reviewed the radiological images as listed and agreed with the findings in the report. No results found. 04/21/17 CT Abdomen   ASSESSMENT & PLAN:   84 y/o male with  1. Newly diagnosed Diffuse Large B Cell Lymphoma - Stage II -bulky  05/26/17 PET/CT which showed 1. 5 cm right-sided retroperitoneal nodal mass is hypermetabolic and consistent with known  lymphoma. Small lymph node just below the aortic bifurcation is also hypermetabolic. -ECHO shows normal EF. PLAN Discussed pt labwork today, 07/16/17; blood counts and chemistries are stable -proceed with C3 of R-mini CHOP -Discussed increasing Doxorubicin to the standard dose 50 mg/m2 with pt, he agrees this is ideal. His labs are holding well and he has responded well.  -Would recommend that the pt see Dr Tresa Moore sooner than July 9 after his PET/CT on June 3 -If PET/CT shows decent response, surgery would be recommended, followed by waiting minimum 2-4 weeks post surgery before continuing chemotherapy -Neulasta on D2 or D3 of each cycle. -Discussed continuing through C3, followed by PET, if significant response is realized, it would  be reasonable to pursue urothelial surgery, followed by 2-3 more cycles after successful post-surgical healing.  -Advised that the pt continue staying well hydrated, well fed, and walk 20-30 minutes each day.   2. 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  -Surgery for the uretral mass will likely best be saved until after treatment of his lymphoma.  -The patient has had hematuria for a year and a half, which may indicate a fairly well behaved tumor and the ability to confidently prioritize his lymphoma which would be rapidly progressive without treatment. -plan as above -continue urologic f/u with Dr Tresa Moore.  PET/CT 07/28/2017 RTC with Dr Irene Limbo on 07/29/2017 with labs  All of the patients questions were answered with apparent satisfaction. The patient knows to call the clinic with any problems, questions or concerns.   . The total time spent in the appointment was 25 minutes and more than 50% was on counseling and direct patient cares.     Sullivan Lone MD [place] AAHIVMS [facility] Hematology/Oncology Physician [facility]  (Office):       [phone] (Work cell):  [phone] (Fax):           [phone]  07/16/2017 8:59 AM  This document serves as a record of services personally performed by Sullivan Lone, MD. It was created on his behalf by Baldwin Jamaica, a trained medical scribe. The creation of this record is based on the scribe's personal observations and the provider's statements to them.   .I have reviewed the above documentation for accuracy and completeness, and I agree with the above. Brunetta Genera MD MS

## 2017-07-16 ENCOUNTER — Encounter: Payer: Self-pay | Admitting: Hematology

## 2017-07-16 ENCOUNTER — Inpatient Hospital Stay (HOSPITAL_BASED_OUTPATIENT_CLINIC_OR_DEPARTMENT_OTHER): Payer: Medicare Other | Admitting: Hematology

## 2017-07-16 ENCOUNTER — Inpatient Hospital Stay: Payer: Medicare Other

## 2017-07-16 ENCOUNTER — Telehealth: Payer: Self-pay | Admitting: Hematology

## 2017-07-16 VITALS — BP 127/67 | HR 67 | Temp 97.9°F | Resp 18 | Ht 69.0 in | Wt 210.2 lb

## 2017-07-16 VITALS — BP 119/61 | HR 67 | Temp 98.2°F | Resp 15

## 2017-07-16 DIAGNOSIS — R319 Hematuria, unspecified: Secondary | ICD-10-CM | POA: Diagnosis not present

## 2017-07-16 DIAGNOSIS — C689 Malignant neoplasm of urinary organ, unspecified: Secondary | ICD-10-CM

## 2017-07-16 DIAGNOSIS — Z85038 Personal history of other malignant neoplasm of large intestine: Secondary | ICD-10-CM | POA: Diagnosis not present

## 2017-07-16 DIAGNOSIS — C8330 Diffuse large B-cell lymphoma, unspecified site: Secondary | ICD-10-CM

## 2017-07-16 DIAGNOSIS — C8333 Diffuse large B-cell lymphoma, intra-abdominal lymph nodes: Secondary | ICD-10-CM

## 2017-07-16 DIAGNOSIS — Z8546 Personal history of malignant neoplasm of prostate: Secondary | ICD-10-CM

## 2017-07-16 DIAGNOSIS — Z95828 Presence of other vascular implants and grafts: Secondary | ICD-10-CM

## 2017-07-16 DIAGNOSIS — Z5111 Encounter for antineoplastic chemotherapy: Secondary | ICD-10-CM | POA: Diagnosis not present

## 2017-07-16 DIAGNOSIS — C662 Malignant neoplasm of left ureter: Secondary | ICD-10-CM | POA: Diagnosis not present

## 2017-07-16 LAB — CMP (CANCER CENTER ONLY)
ALBUMIN: 3.5 g/dL (ref 3.5–5.0)
ALT: 16 U/L (ref 0–55)
AST: 12 U/L (ref 5–34)
Alkaline Phosphatase: 62 U/L (ref 40–150)
Anion gap: 9 (ref 3–11)
BUN: 13 mg/dL (ref 7–26)
CHLORIDE: 110 mmol/L — AB (ref 98–109)
CO2: 22 mmol/L (ref 22–29)
Calcium: 9.5 mg/dL (ref 8.4–10.4)
Creatinine: 1.56 mg/dL — ABNORMAL HIGH (ref 0.70–1.30)
GFR, EST NON AFRICAN AMERICAN: 40 mL/min — AB (ref >60)
GFR, Est AFR Am: 46 mL/min — ABNORMAL LOW (ref >60)
Glucose, Bld: 167 mg/dL — ABNORMAL HIGH (ref 70–140)
POTASSIUM: 3.7 mmol/L (ref 3.5–5.1)
SODIUM: 141 mmol/L (ref 136–145)
Total Bilirubin: 0.2 mg/dL (ref 0.2–1.2)
Total Protein: 6.5 g/dL (ref 6.4–8.3)

## 2017-07-16 LAB — CBC WITH DIFFERENTIAL (CANCER CENTER ONLY)
BASOS ABS: 0.1 10*3/uL (ref 0.0–0.1)
Basophils Relative: 2 %
Eosinophils Absolute: 0 10*3/uL (ref 0.0–0.5)
Eosinophils Relative: 0 %
HEMATOCRIT: 39.8 % (ref 38.4–49.9)
Hemoglobin: 12.9 g/dL — ABNORMAL LOW (ref 13.0–17.1)
LYMPHS PCT: 26 %
Lymphs Abs: 1.8 10*3/uL (ref 0.9–3.3)
MCH: 31.3 pg (ref 27.2–33.4)
MCHC: 32.4 g/dL (ref 32.0–36.0)
MCV: 96.6 fL (ref 79.3–98.0)
MONO ABS: 0.6 10*3/uL (ref 0.1–0.9)
Monocytes Relative: 9 %
NEUTROS ABS: 4.4 10*3/uL (ref 1.5–6.5)
Neutrophils Relative %: 63 %
Platelet Count: 271 10*3/uL (ref 140–400)
RBC: 4.12 MIL/uL — AB (ref 4.20–5.82)
RDW: 15.5 % — AB (ref 11.0–14.6)
WBC Count: 6.9 10*3/uL (ref 4.0–10.3)

## 2017-07-16 LAB — RETICULOCYTES
RBC.: 4.12 MIL/uL — AB (ref 4.20–5.82)
RETIC CT PCT: 1.6 % (ref 0.8–1.8)
Retic Count, Absolute: 65.9 10*3/uL (ref 34.8–93.9)

## 2017-07-16 LAB — LACTATE DEHYDROGENASE: LDH: 124 U/L — ABNORMAL LOW (ref 125–245)

## 2017-07-16 MED ORDER — ACETAMINOPHEN 325 MG PO TABS
ORAL_TABLET | ORAL | Status: AC
  Filled 2017-07-16: qty 2

## 2017-07-16 MED ORDER — SODIUM CHLORIDE 0.9 % IV SOLN
1.0000 mg | Freq: Once | INTRAVENOUS | Status: AC
  Administered 2017-07-16: 1 mg via INTRAVENOUS
  Filled 2017-07-16: qty 1

## 2017-07-16 MED ORDER — DOXORUBICIN HCL CHEMO IV INJECTION 2 MG/ML
50.0000 mg/m2 | Freq: Once | INTRAVENOUS | Status: AC
  Administered 2017-07-16: 110 mg via INTRAVENOUS
  Filled 2017-07-16: qty 55

## 2017-07-16 MED ORDER — DIPHENHYDRAMINE HCL 25 MG PO CAPS
50.0000 mg | ORAL_CAPSULE | Freq: Once | ORAL | Status: AC
  Administered 2017-07-16: 50 mg via ORAL

## 2017-07-16 MED ORDER — SODIUM CHLORIDE 0.9% FLUSH
10.0000 mL | INTRAVENOUS | Status: DC | PRN
  Administered 2017-07-16: 10 mL
  Filled 2017-07-16: qty 10

## 2017-07-16 MED ORDER — PALONOSETRON HCL INJECTION 0.25 MG/5ML
0.2500 mg | Freq: Once | INTRAVENOUS | Status: AC
  Administered 2017-07-16: 0.25 mg via INTRAVENOUS

## 2017-07-16 MED ORDER — HEPARIN SOD (PORK) LOCK FLUSH 100 UNIT/ML IV SOLN
500.0000 [IU] | Freq: Once | INTRAVENOUS | Status: AC | PRN
  Administered 2017-07-16: 500 [IU]
  Filled 2017-07-16: qty 5

## 2017-07-16 MED ORDER — SODIUM CHLORIDE 0.9 % IV SOLN
Freq: Once | INTRAVENOUS | Status: AC
  Administered 2017-07-16: 10:00:00 via INTRAVENOUS

## 2017-07-16 MED ORDER — SODIUM CHLORIDE 0.9 % IV SOLN
375.0000 mg/m2 | Freq: Once | INTRAVENOUS | Status: AC
  Administered 2017-07-16: 800 mg via INTRAVENOUS
  Filled 2017-07-16: qty 50

## 2017-07-16 MED ORDER — DIPHENHYDRAMINE HCL 25 MG PO CAPS
ORAL_CAPSULE | ORAL | Status: AC
  Filled 2017-07-16: qty 2

## 2017-07-16 MED ORDER — DEXAMETHASONE SODIUM PHOSPHATE 10 MG/ML IJ SOLN
10.0000 mg | Freq: Once | INTRAMUSCULAR | Status: AC
  Administered 2017-07-16: 10 mg via INTRAVENOUS

## 2017-07-16 MED ORDER — SODIUM CHLORIDE 0.9% FLUSH
10.0000 mL | Freq: Once | INTRAVENOUS | Status: AC
  Administered 2017-07-16: 10 mL
  Filled 2017-07-16: qty 10

## 2017-07-16 MED ORDER — DEXAMETHASONE SODIUM PHOSPHATE 10 MG/ML IJ SOLN
INTRAMUSCULAR | Status: AC
  Filled 2017-07-16: qty 1

## 2017-07-16 MED ORDER — PALONOSETRON HCL INJECTION 0.25 MG/5ML
INTRAVENOUS | Status: AC
Start: 2017-07-16 — End: ?
  Filled 2017-07-16: qty 5

## 2017-07-16 MED ORDER — ACETAMINOPHEN 325 MG PO TABS
650.0000 mg | ORAL_TABLET | Freq: Once | ORAL | Status: AC
  Administered 2017-07-16: 650 mg via ORAL

## 2017-07-16 MED ORDER — SODIUM CHLORIDE 0.9 % IV SOLN
500.0000 mg/m2 | Freq: Once | INTRAVENOUS | Status: AC
  Administered 2017-07-16: 1100 mg via INTRAVENOUS
  Filled 2017-07-16: qty 55

## 2017-07-16 NOTE — Patient Instructions (Signed)
[facility] Discharge Instructions for Patients Receiving Chemotherapy  Today you received the following chemotherapy agents Rituxan, Adriamycin, Cytoxan, Vincristine.  To help prevent nausea and vomiting after your treatment, we encourage you to take your nausea medication as directed.  If you develop nausea and vomiting that is not controlled by your nausea medication, call the clinic.   BELOW ARE SYMPTOMS THAT SHOULD BE REPORTED IMMEDIATELY:  *FEVER GREATER THAN 100.5 F  *CHILLS WITH OR WITHOUT FEVER  NAUSEA AND VOMITING THAT IS NOT CONTROLLED WITH YOUR NAUSEA MEDICATION  *UNUSUAL SHORTNESS OF BREATH  *UNUSUAL BRUISING OR BLEEDING  TENDERNESS IN MOUTH AND THROAT WITH OR WITHOUT PRESENCE OF ULCERS  *URINARY PROBLEMS  *BOWEL PROBLEMS  UNUSUAL RASH Items with * indicate a potential emergency and should be followed up as soon as possible.  Feel free to call the clinic should you have any questions or concerns. The clinic phone number is [phone].  Please show the CHEMO ALERT CARD at check-in to the Emergency Department and triage nurse.   

## 2017-07-16 NOTE — Patient Instructions (Signed)
Thank you for choosing [facility] to provide your oncology and hematology care.  To afford each patient quality time with our providers, please arrive 30 minutes before your scheduled appointment time.  If you arrive late for your appointment, you may be asked to reschedule.  We strive to give you quality time with our providers, and arriving late affects you and other patients whose appointments are after yours.   If you are a no show for multiple scheduled visits, you may be dismissed from the clinic at the providers discretion.    Again, thank you for choosing [facility], our hope is that these requests will decrease the amount of time that you wait before being seen by our physicians.  ______________________________________________________________________  Should you have questions after your visit to the [facility], please contact our office at [phone] between the hours of 8:30 and 4:30 p.m.    Voicemails left after 4:30p.m will not be returned until the following business day.    For prescription refill requests, please have your pharmacy contact us directly.  Please also try to allow 48 hours for prescription requests.    Please contact the scheduling department for questions regarding scheduling.  For scheduling of procedures such as PET scans, CT scans, MRI, Ultrasound, etc please contact central scheduling at [phone].    Resources For Cancer Patients and Caregivers:   Oncolink.org:  A wonderful resource for patients and healthcare providers for information regarding your disease, ways to tract your treatment, what to expect, etc.     American Cancer Society:  800-227-2345  Can help patients locate various types of support and financial assistance  Cancer Care: 1-800-813-HOPE (4673) Provides financial assistance, online support groups, medication/co-pay assistance.    [place] DSS:  336-641-3447 Where to apply for food  stamps, Medicaid, and utility assistance  Medicare Rights Center: 800-333-4114 Helps people with Medicare understand their rights and benefits, navigate the Medicare system, and secure the quality healthcare they deserve  SCAT: 336-333-6589 [place] [employer]'s shared-ride transportation service for eligible riders who have a disability that prevents them from riding the fixed route bus.    For additional information on assistance programs please contact our social worker:   Grier Hock/Abigail Elmore:  [phone]            

## 2017-07-16 NOTE — Telephone Encounter (Signed)
Appointments scheduled AVS/Calendar printed per 5/22 los °

## 2017-07-16 NOTE — Progress Notes (Signed)
Ok to treat per Dr. Irene Limbo with Scr 1.56.

## 2017-07-18 ENCOUNTER — Inpatient Hospital Stay: Payer: Medicare Other

## 2017-07-18 ENCOUNTER — Ambulatory Visit: Payer: Medicare Other

## 2017-07-18 VITALS — BP 130/60 | HR 80 | Temp 97.5°F | Resp 18

## 2017-07-18 DIAGNOSIS — Z5111 Encounter for antineoplastic chemotherapy: Secondary | ICD-10-CM | POA: Diagnosis not present

## 2017-07-18 DIAGNOSIS — C8330 Diffuse large B-cell lymphoma, unspecified site: Secondary | ICD-10-CM

## 2017-07-18 MED ORDER — PEGFILGRASTIM-CBQV 6 MG/0.6ML ~~LOC~~ SOSY
6.0000 mg | PREFILLED_SYRINGE | Freq: Once | SUBCUTANEOUS | Status: AC
  Administered 2017-07-18: 6 mg via SUBCUTANEOUS

## 2017-07-18 NOTE — Patient Instructions (Signed)
Pegfilgrastim injection What is this medicine? PEGFILGRASTIM (PEG fil gra stim) is a long-acting granulocyte colony-stimulating factor that stimulates the growth of neutrophils, a type of white blood cell important in the body's fight against infection. It is used to reduce the incidence of fever and infection in patients with certain types of cancer who are receiving chemotherapy that affects the bone marrow, and to increase survival after being exposed to high doses of radiation. This medicine may be used for other purposes; ask your health care provider or pharmacist if you have questions. COMMON BRAND NAME(S): Neulasta What should I tell my health care provider before I take this medicine? They need to know if you have any of these conditions: -kidney disease -latex allergy -ongoing radiation therapy -sickle cell disease -skin reactions to acrylic adhesives (On-Body Injector only) -an unusual or allergic reaction to pegfilgrastim, filgrastim, other medicines, foods, dyes, or preservatives -pregnant or trying to get pregnant -breast-feeding How should I use this medicine? This medicine is for injection under the skin. If you get this medicine at home, you will be taught how to prepare and give the pre-filled syringe or how to use the On-body Injector. Refer to the patient Instructions for Use for detailed instructions. Use exactly as directed. Tell your healthcare provider immediately if you suspect that the On-body Injector may not have performed as intended or if you suspect the use of the On-body Injector resulted in a missed or partial dose. It is important that you put your used needles and syringes in a special sharps container. Do not put them in a trash can. If you do not have a sharps container, call your pharmacist or healthcare provider to get one. Talk to your pediatrician regarding the use of this medicine in children. While this drug may be prescribed for selected conditions,  precautions do apply. Overdosage: If you think you have taken too much of this medicine contact a poison control center or emergency room at once. NOTE: This medicine is only for you. Do not share this medicine with others. What if I miss a dose? It is important not to miss your dose. Call your doctor or health care professional if you miss your dose. If you miss a dose due to an On-body Injector failure or leakage, a new dose should be administered as soon as possible using a single prefilled syringe for manual use. What may interact with this medicine? Interactions have not been studied. Give your health care provider a list of all the medicines, herbs, non-prescription drugs, or dietary supplements you use. Also tell them if you smoke, drink alcohol, or use illegal drugs. Some items may interact with your medicine. This list may not describe all possible interactions. Give your health care provider a list of all the medicines, herbs, non-prescription drugs, or dietary supplements you use. Also tell them if you smoke, drink alcohol, or use illegal drugs. Some items may interact with your medicine. What should I watch for while using this medicine? You may need blood work done while you are taking this medicine. If you are going to need a MRI, CT scan, or other procedure, tell your doctor that you are using this medicine (On-Body Injector only). What side effects may I notice from receiving this medicine? Side effects that you should report to your doctor or health care professional as soon as possible: -allergic reactions like skin rash, itching or hives, swelling of the face, lips, or tongue -dizziness -fever -pain, redness, or irritation at site   where injected -pinpoint red spots on the skin -red or dark-brown urine -shortness of breath or breathing problems -stomach or side pain, or pain at the shoulder -swelling -tiredness -trouble passing urine or change in the amount of urine Side  effects that usually do not require medical attention (report to your doctor or health care professional if they continue or are bothersome): -bone pain -muscle pain This list may not describe all possible side effects. Call your doctor for medical advice about side effects. You may report side effects to FDA at 1-800-FDA-1088. Where should I keep my medicine? Keep out of the reach of children. Store pre-filled syringes in a refrigerator between 2 and 8 degrees C (36 and 46 degrees F). Do not freeze. Keep in carton to protect from light. Throw away this medicine if it is left out of the refrigerator for more than 48 hours. Throw away any unused medicine after the expiration date. NOTE: This sheet is a summary. It may not cover all possible information. If you have questions about this medicine, talk to your doctor, pharmacist, or health care provider.  2018 Elsevier/Gold Standard (2016-02-08 12:58:03)  

## 2017-07-28 ENCOUNTER — Ambulatory Visit (HOSPITAL_COMMUNITY)
Admission: RE | Admit: 2017-07-28 | Discharge: 2017-07-28 | Disposition: A | Discharge status: Home / Self Care | Payer: Medicare Other | Source: Ambulatory Visit | Attending: Hematology | Admitting: Hematology

## 2017-07-28 DIAGNOSIS — R1909 Other intra-abdominal and pelvic swelling, mass and lump: Secondary | ICD-10-CM | POA: Diagnosis not present

## 2017-07-28 DIAGNOSIS — I700 Atherosclerosis of aorta: Secondary | ICD-10-CM | POA: Insufficient documentation

## 2017-07-28 DIAGNOSIS — C8333 Diffuse large B-cell lymphoma, intra-abdominal lymph nodes: Secondary | ICD-10-CM | POA: Insufficient documentation

## 2017-07-28 LAB — GLUCOSE, CAPILLARY: GLUCOSE-CAPILLARY: 116 mg/dL — AB (ref 65–99)

## 2017-07-28 MED ORDER — FLUDEOXYGLUCOSE F - 18 (FDG) INJECTION
10.4000 | Freq: Once | INTRAVENOUS | Status: AC | PRN
  Administered 2017-07-28: 10.4 via INTRAVENOUS

## 2017-07-28 NOTE — Progress Notes (Signed)
HEMATOLOGY/ONCOLOGY CLINIC NOTE  Date of Service: 07/29/17  Patient Care Team: Lorene Dy, MD as PCP - General (Internal Medicine)  CHIEF COMPLAINTS/PURPOSE OF CONSULTATION:  F/u for mx of diffuse large B-Cell Lymphoma  HISTORY OF PRESENTING ILLNESS:   Jonathan Marshall is a wonderful 84 y.o. male who has been referred to [place] by urologist Dr Alexis Frock for evaluation and management of newly diagnosed diffuse large B Cell Lymphoma. He is accompanied today by his wife. The pt reports that he is doing well overall.   The pt reports that in 1993 he had colon cancer with two tumors in the ascending and descending colon, which was picked up from a routine physical. He notes that he was treated with chemotherapy with Levamisole and Leukovorin for 6 months. He has not had any issues with the colon cancer since. He continue to see Dr. Cristina Gong for his GI.   He notes that he has had blood in the urine for a year and a half, picked up in a physical. This prompted his TURP surgery in July 2018. He notes that the bleeding persisted after the surgery. He notes that a stent was placed which has significantly reduced his hematuria. In February 2019 his left uretral mass was identified and subsequently biopsied confirming adenocarcinoma. The pt notes that Dr. Tresa Moore has up to this point been considering removing one of his kidneys, which would have a bearing on our treatment.   A 04/21/17 CT Abdomen identified a right retroperitoneal mass which was biopsied on 05/08/17 which revealed Large B-Cell Lymphoma.   He notes no ongoing health problems and is without physical limitations. He denies having had any blood transfusions.   On 04/23/17 the pt had a Uretral biopsy revealing Ureter, biopsy, Left mass- SUPERFICIAL FRAGMENTS OF UROTHELIAL CARCINOMA ASSOCIATED WITH GRANULATION TISSUE AND NECROSIS.   CT Abdomen of 04/21/17 revealed large right para-aortic retroperitoneal nodal conglomeration in the abdomen,  just inferior to the right renal artery. Imaging features compatible with metastatic disease in this patient with a history of prostate and left urothelial cancer. 2. Aortic atherosclerosis.   Soft Tissue Needle/core biopsy completed on 05/08/17 with results revealing Large B-Cell Lymphoma.   Most recent lab results (05/08/17) of CBC  is as follows: all values are WNL except for WBC at 11.3k, Monocytes Abs at 1.1k, CO2 at 19, Glucose at 109, Creatinine at 1.54, ALT at 16.  On review of systems, pt reports infrequent hematuria, mild back pain associated with golfing, and denies leg swelling, flank pain, fevers, chills, night sweats and any other symptoms.   On PMHx the pt reports Colon cancer in 1993, hypercholesterolemia, glaucoma, low risk prostate cancer, mastoiditis, hernia repair, carpel tunnel. He denies heart or lung problems.  On Social Hx the pt denies smoking and ETOH consumption. On Family Hx the pt reports that his mother had breast cancer when she was roughly 57, and also cervical cancer. On his mother's side, his aunts and uncles had several cancers including lung. Maternal grandfather had prostate cancer.   Interval History:  Jonathan Marshall returns today regarding his Diffuse Large B-cell Lymphoma. [name] was on 07/16/17. He is accompanied today by his wife. The pt reports that he is doing well overall. He notes that he will be following up with Dr Tresa Moore on June 17.   The pt reports that he has developed some mild mouth sores and denies using any mouthwash for this yet, not believing he  needs to yet. He notes that his appetite has decreased and he cannot taste his food very well. He has lost about 10 pounds in the last month. He also notes some increased fatigue and has not been as active this past cycle.   Of note since the patient's last visit, pt has had PET completed on 07/28/17 with results revealing 1. Significant response to interval therapy. The large  retroperitoneal mass is significantly smaller with minimal residual activity, slightly greater than blood pool (Deauville 3). No evidence of disease progression. 2. Generalized increased marrow activity attributed to interval therapy. 3. Stable incidental findings including coronary and Aortic Atherosclerosis (ICD10-I70.0). 4. Patent left ureteral stent.  Lab results today (07/29/17) of CBC, CMP, and Reticulocytes is as follows: all values are WNL except for WBC at 11.6k, RBC at 3.96, HGB at 12.5, HCT at 37.7, RDW at 15.5, ANC at 9.2k, Chloride at 111, CO2 at 17, Glucose at 146, Creatinine at 1.74, Total Bilirubin at <0.2.. Magnesium 07/29/17 is WNL at 1.8 LDH 07/29/17 is WNL at 137  On review of systems, pt reports some weight loss, mouth soreness, some fatigue, decreased appetite, and denies fevers, abdominal pains, leg swelling, testicular pain or discomfort, and any other symptoms.    MEDICAL HISTORY:  Past Medical History:  Diagnosis Date  . BPH (benign prostatic hyperplasia)   . Cancer [facility]) 1993   Colon  . DDD (degenerative disc disease), lumbar    L4-L5  . Depression   . Glaucoma, both eyes   . Hearing loss   . Hematuria, gross   . History of adenomatous polyp of colon   . History of carpal tunnel syndrome    left  . History of colon cancer    dx 1993  s/p  hemicolectomy (malignant tumor x2)  and chemo therapy completed 1993--- no recurrence per pt  . History of shingles   . Hyperlipidemia   . Insomnia     SURGICAL HISTORY: Past Surgical History:  Procedure Laterality Date  . APPENDECTOMY    . CARPAL TUNNEL RELEASE Left 08/13/2013   Procedure: CARPAL TUNNEL RELEASE LEFT WRIST;  Surgeon: Yvette Rack., MD;  Location: [place];  Service: Orthopedics;  Laterality: Left;  . COLONOSCOPY    . CYSTOSCOPY W/ RETROGRADES Bilateral 04/03/2017   Procedure: CYSTOSCOPY WITH RETROGRADE PYELOGRAM, LEFT URETEROSCOPY, LEFT STENT PLACEMENT;  Surgeon: Franchot Gallo,  MD;  Location[facility];  Service: Urology;  Laterality: Bilateral;  . CYSTOSCOPY W/ URETERAL STENT PLACEMENT Left 04/23/2017   Procedure: CYSTOSCOPY WITH STENT REPLACEMENT;  Surgeon: Alexis Frock, MD;  Location: [facility];  Service: Urology;  Laterality: Left;  . CYSTOSCOPY WITH FULGERATION Left 04/03/2017   Procedure: LEFT URETERAL BIOPSY;  Surgeon: Franchot Gallo, MD;  Location: [facility];  Service: Urology;  Laterality: Left;  . CYSTOSCOPY/RETROGRADE/URETEROSCOPY Left 04/23/2017   Procedure: CYSTOSCOPY/RETROGRADE/URETEROSCOPY, LEFT  URETEROSCOPY WITY  BIOPSY;  Surgeon: Alexis Frock, MD;  Location: [facility];  Service: Urology;  Laterality: Left;  . HEMICOLECTOMY  1993   malignant tumor x2 , colon cancer and Appendectomy  . INGUINAL HERNIA REPAIR Right 2003  . IR FLUORO GUIDE PORT INSERTION RIGHT  05/30/2017  . IR US GUIDE VASC ACCESS RIGHT  05/30/2017  . MASTOIDECTOMY Right 2015  . SIGMOIDOSCOPY  last one 06/ 2018  . TRANSURETHRAL RESECTION OF PROSTATE N/A 09/02/2016   Procedure: TRANSURETHRAL RESECTION OF THE PROSTATE (TURP);  Surgeon: Franchot Gallo, MD;  Location: [place]  [place];  Service: Urology;  Laterality: N/A;    SOCIAL HISTORY: Social History   Socioeconomic History  . Marital status: Married    Spouse name: Not on file  . Number of children: Not on file  . Years of education: Not on file  . Highest education level: Not on file  Occupational History  . Not on file  Social Needs  . Financial resource strain: Not on file  . Food insecurity:    Worry: Not on file    Inability: Not on file  . Transportation needs:    Medical: Not on file    Non-medical: Not on file  Tobacco Use  . Smoking status: Never Smoker  . Smokeless tobacco: Never Used  Substance and Sexual Activity  . Alcohol use: Yes    Alcohol/week: 0.6 oz    Types: 1 Glasses of wine per week    Comment: once per week    . Drug use: No  . Sexual activity: Not on file  Lifestyle  . Physical activity:    Days per week: Not on file    Minutes per session: Not on file  . Stress: Not on file  Relationships  . Social connections:    Talks on phone: Not on file    Gets together: Not on file    Attends religious service: Not on file    Active member of club or organization: Not on file    Attends meetings of clubs or organizations: Not on file    Relationship status: Not on file  . Intimate partner violence:    Fear of current or ex partner: Not on file    Emotionally abused: Not on file    Physically abused: Not on file    Forced sexual activity: Not on file  Other Topics Concern  . Not on file  Social History Narrative  . Not on file    FAMILY HISTORY: No family history on file.  ALLERGIES:  has No Known Allergies.  MEDICATIONS:  Current Outpatient Medications  Medication Sig Dispense Refill  . clotrimazole (LOTRIMIN) 1 % cream Apply 1 application topically 2 (two) times daily as needed (for foot rash).    . diphenhydramine-acetaminophen (TYLENOL PM) 25-500 MG TABS tablet Take 2 tablets by mouth at bedtime.     . fluticasone (FLONASE) 50 MCG/ACT nasal spray Place 1 spray into both nostrils as needed.     . latanoprost (XALATAN) 0.005 % ophthalmic solution Place 1 drop into both eyes at bedtime.    . lidocaine-prilocaine (EMLA) cream Apply 1 application topically as needed. For use one hour prior to port access. Place quarter-size amount over port and cover with press-n-seal. 30 g 0  . LORazepam (ATIVAN) 0.5 MG tablet Take 1 tablet (0.5 mg total) by mouth every 6 (six) hours as needed (Nausea or vomiting). 30 tablet 0  . lovastatin (MEVACOR) 40 MG tablet Take 40 mg by mouth every evening.    . ondansetron (ZOFRAN) 8 MG tablet Take 1 tablet (8 mg total) by mouth 2 (two) times daily as needed for refractory nausea / vomiting. Start on day 3 after cytoxan chemorx 30 tablet 1  . predniSONE  (DELTASONE) 20 MG tablet Take 3 tablets (60 mg total) by mouth daily. Take on days 1-5 of chemotherapy. 15 tablet 6  . prochlorperazine (COMPAZINE) 10 MG tablet Take 1 tablet (10 mg total) by mouth every 6 (six) hours as needed (Nausea or vomiting). 30 tablet 6  . sertraline (  ZOLOFT) 25 MG tablet Take 25 mg by mouth every morning.      No current facility-administered medications for this visit.     REVIEW OF SYSTEMS:   A 10+ POINT REVIEW OF SYSTEMS WAS OBTAINED including neurology, dermatology, psychiatry, cardiac, respiratory, lymph, extremities, GI, GU, Musculoskeletal, constitutional, breasts, reproductive, HEENT.  All pertinent positives are noted in the HPI.  All others are negative.    PHYSICAL EXAMINATION: ECOG PERFORMANCE STATUS: 1 - Symptomatic but completely ambulatory  . Vitals:   07/29/17 1310  BP: 127/82  Pulse: 77  Resp: 17  Temp: 97.7 F (36.5 C)  SpO2: 99%   Filed Weights   07/29/17 1310  Weight: 199 lb 12.8 oz (90.6 kg)   .Body mass index is 29.51 kg/m.  GENERAL:alert, in no acute distress and comfortable SKIN: no acute rashes, no significant lesions EYES: conjunctiva are pink and non-injected, sclera anicteric OROPHARYNX: MMM, no exudates, no oropharyngeal erythema or ulceration NECK: supple, no JVD LYMPH:  no palpable lymphadenopathy in the cervical, axillary or inguinal regions LUNGS: clear to auscultation b/l with normal respiratory effort HEART: regular rate & rhythm ABDOMEN:  normoactive bowel sounds , non tender, not distended. Extremity: no pedal edema PSYCH: alert & oriented x 3 with fluent speech NEURO: no focal motor/sensory deficits   LABORATORY DATA:  I have reviewed the data as listed  . CBC Latest Ref Rng & Units 07/29/2017 07/16/2017 06/25/2017  WBC 4.0 - 10.3 K/uL 11.6(H) 6.9 7.0  Hemoglobin 13.0 - 17.1 g/dL 12.5(L) 12.9(L) 13.3  Hematocrit 38.4 - 49.9 % 37.7(L) 39.8 40.9  Platelets 140 - 400 K/uL 222 271 284    . CMP Latest Ref  Rng & Units 07/29/2017 07/16/2017 06/25/2017  Glucose 70 - 140 mg/dL 146(H) 167(H) 189(H)  BUN 7 - 26 mg/dL 17 13 13   Creatinine 0.70 - 1.30 mg/dL 1.74(H) 1.56(H) 1.43(H)  Sodium 136 - 145 mmol/L 137 141 139  Potassium 3.5 - 5.1 mmol/L 3.6 3.7 3.7  Chloride 98 - 109 mmol/L 111(H) 110(H) 112(H)  CO2 22 - 29 mmol/L 17(L) 22 20(L)  Calcium 8.4 - 10.4 mg/dL 9.5 9.5 9.5  Total Protein 6.4 - 8.3 g/dL 6.9 6.5 6.8  Total Bilirubin 0.2 - 1.2 mg/dL <0.2(L) 0.2 <0.2(L)  Alkaline Phos 40 - 150 U/L 74 62 65  AST 5 - 34 U/L 13 12 12   ALT 0 - 55 U/L 12 16 14    Component     Latest Ref Rng & Units 05/15/2017  Retic Ct Pct     0.8 - 1.8 % 1.6  RBC.     4.20 - 5.82 MIL/uL 4.62  Retic Count, Absolute     34.8 - 93.9 K/uL 73.9  Prothrombin Time     11.4 - 15.2 seconds 14.5  INR      1.13  APTT     24 - 36 seconds 31  HIV Screen 4th Generation wRfx     Non Reactive Non Reactive  HCV Ab     0.0 - 0.9 s/co ratio <0.1  Hep B Core Ab, Tot     Negative Negative  Hepatitis B Surface Ag     Negative Negative  LDH     125 - 245 U/L 132   . Lab Results  Component Value Date   LDH 137 07/29/2017     05/08/17 Soft Tissue Needle Core Biopsy:   04/23/17 Ureter Biopsy:   RADIOGRAPHIC STUDIES: I have personally reviewed the radiological images as listed and  agreed with the findings in the report. Nm Pet Image Restag (ps) Skull Base To Thigh  Result Date: 07/28/2017 CLINICAL DATA:  Subsequent treatment strategy for diffuse large B-cell lymphoma. EXAM: NUCLEAR MEDICINE PET SKULL BASE TO THIGH TECHNIQUE: 10.5 mCi F-18 FDG was injected intravenously. Full-ring PET imaging was performed from the skull base to thigh after the radiotracer. CT data was obtained and used for attenuation correction and anatomic localization. Fasting blood glucose: 116 mg/dl COMPARISON:  PET-CT 05/29/2017.  Abdominopelvic CT 04/21/2017. FINDINGS: Mediastinal blood pool activity: SUV max 2.7. Hepatic activity SUV max 4.0. NECK: No  hypermetabolic cervical lymph nodes are identified.There are no lesions of the pharyngeal mucosal space. Incidental CT findings: none CHEST: There are no hypermetabolic mediastinal, hilar or axillary lymph nodes. There is no hypermetabolic pulmonary activity. Incidental CT findings: Right IJ Port-A-Cath extends to the superior cavoatrial junction. There is atherosclerosis of the aorta and coronary arteries. 4 mm perifissural nodule along the left major fissure on image 39/8 is stable. No new or enlarging pulmonary nodules. ABDOMEN/PELVIS: There is no hypermetabolic activity within the liver, adrenal glands, spleen or pancreas. There has been marked interval improvement in the previously demonstrated large hypermetabolic retroperitoneal mass. The residual mass measures approximately 2.9 x 2.7 cm on image 134/4 and demonstrates metabolic activity similar to background liver activity (SUV max 4.1). The other small hypermetabolic node previously noted at the aortic bifurcation is no longer present. Incidental CT findings: Double-J left ureteral stent remains in place and is patent. There is a right renal cyst and atherosclerosis of the aorta and its branches. SKELETON: There is no hypermetabolic activity to suggest osseous metastatic disease. Generalized increased marrow activity is attributed to response to interval therapy. Incidental CT findings: Mild lumbar spondylosis, stable. IMPRESSION: 1. Significant response to interval therapy. The large retroperitoneal mass is significantly smaller with minimal residual activity, slightly greater than blood pool (Deauville 3). No evidence of disease progression. 2. Generalized increased marrow activity attributed to interval therapy. 3. Stable incidental findings including coronary and Aortic Atherosclerosis (ICD10-I70.0). 4. Patent left ureteral stent. Electronically Signed   By: Richardean Sale M.D.   On: 07/28/2017 09:40   04/21/17 CT Abdomen   ASSESSMENT & PLAN:    84 y/o male with  1. Recently diagnosed Diffuse Large B Cell Lymphoma - Stage II -bulky  05/26/17 PET/CT which showed 1. 5 cm right-sided retroperitoneal nodal mass is hypermetabolic and consistent with known lymphoma. Small lymph node just below the aortic bifurcation is also hypermetabolic. -ECHO shows normal EF.  S/[p 3 cycles of R-mini CHOP PLAN -Discussed 07/28/17 PET/CT which revealed Significant response to interval therapy. The large retroperitoneal mass is significantly smaller with minimal residual activity, slightly greater than blood pool (Deauville 3). No evidence of disease progression.  -Recommend salt and baking soda mouthwash for mild sores -Recommend using ice chips or popsicles during infusion to limit chemotherapy exposure to mouth and throat -Recommend Ensure/boost for nutrition supplement -Advised that pt increase his food consumption   2. 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  -Surgery for the uretral mass will likely best be saved until after treatment of his lymphoma.  -The patient has had hematuria for a year and a half, which may indicate a fairly well behaved tumor and the ability to confidently prioritize his lymphoma which would be rapidly progressive without treatment. -plan as above -continue urologic f/u with Dr Tresa Moore. -I discussed surgical timeline with Dr  Manny and decided that we will proceed with C4 prior to impending surgery  -Discussed with pt that surgery would need to be preceded by 3 weeks off of chemotherapy   F/u as per scheduled appointment on 08/06/2017   All of the patients questions were answered with apparent satisfaction. The patient knows to call the clinic with any problems, questions or concerns.  . The total time spent in the appointment was 25 minutes and more than 50% was on counseling and direct patient cares.     Sullivan Lone MD MS AAHIVMS [facility]  [facility] Hematology/Oncology Physician [facility]  (Office):       [phone] (Work cell):  [phone] (Fax):           [phone]  07/29/2017 2:02 PM  I, Baldwin [place], am acting as a Education administrator for Dr Irene Limbo.   .I have reviewed the above documentation for accuracy and completeness, and I agree with the above. Brunetta Genera MD

## 2017-07-29 ENCOUNTER — Inpatient Hospital Stay (HOSPITAL_BASED_OUTPATIENT_CLINIC_OR_DEPARTMENT_OTHER): Payer: Medicare Other | Admitting: Hematology

## 2017-07-29 ENCOUNTER — Inpatient Hospital Stay: Payer: Medicare Other | Attending: Hematology

## 2017-07-29 VITALS — BP 127/82 | HR 77 | Temp 97.7°F | Resp 17 | Ht 69.0 in | Wt 199.8 lb

## 2017-07-29 DIAGNOSIS — R634 Abnormal weight loss: Secondary | ICD-10-CM

## 2017-07-29 DIAGNOSIS — Z5189 Encounter for other specified aftercare: Secondary | ICD-10-CM | POA: Diagnosis not present

## 2017-07-29 DIAGNOSIS — C662 Malignant neoplasm of left ureter: Secondary | ICD-10-CM

## 2017-07-29 DIAGNOSIS — C8330 Diffuse large B-cell lymphoma, unspecified site: Secondary | ICD-10-CM

## 2017-07-29 DIAGNOSIS — C8333 Diffuse large B-cell lymphoma, intra-abdominal lymph nodes: Secondary | ICD-10-CM | POA: Insufficient documentation

## 2017-07-29 DIAGNOSIS — Z5112 Encounter for antineoplastic immunotherapy: Secondary | ICD-10-CM | POA: Insufficient documentation

## 2017-07-29 DIAGNOSIS — R630 Anorexia: Secondary | ICD-10-CM | POA: Insufficient documentation

## 2017-07-29 DIAGNOSIS — C689 Malignant neoplasm of urinary organ, unspecified: Secondary | ICD-10-CM

## 2017-07-29 DIAGNOSIS — Z5111 Encounter for antineoplastic chemotherapy: Secondary | ICD-10-CM | POA: Diagnosis present

## 2017-07-29 DIAGNOSIS — K1379 Other lesions of oral mucosa: Secondary | ICD-10-CM

## 2017-07-29 DIAGNOSIS — Z95828 Presence of other vascular implants and grafts: Secondary | ICD-10-CM

## 2017-07-29 LAB — MAGNESIUM: Magnesium: 1.8 mg/dL (ref 1.7–2.4)

## 2017-07-29 LAB — CMP (CANCER CENTER ONLY)
ALBUMIN: 3.7 g/dL (ref 3.5–5.0)
ALT: 12 U/L (ref 0–55)
ANION GAP: 9 (ref 3–11)
AST: 13 U/L (ref 5–34)
Alkaline Phosphatase: 74 U/L (ref 40–150)
BUN: 17 mg/dL (ref 7–26)
CO2: 17 mmol/L — AB (ref 22–29)
CREATININE: 1.74 mg/dL — AB (ref 0.70–1.30)
Calcium: 9.5 mg/dL (ref 8.4–10.4)
Chloride: 111 mmol/L — ABNORMAL HIGH (ref 98–109)
GFR, Est AFR Am: 40 mL/min — ABNORMAL LOW (ref >60)
GFR, Estimated: 35 mL/min — ABNORMAL LOW (ref >60)
GLUCOSE: 146 mg/dL — AB (ref 70–140)
Potassium: 3.6 mmol/L (ref 3.5–5.1)
SODIUM: 137 mmol/L (ref 136–145)
TOTAL PROTEIN: 6.9 g/dL (ref 6.4–8.3)

## 2017-07-29 LAB — CBC WITH DIFFERENTIAL/PLATELET
BASOS ABS: 0 10*3/uL (ref 0.0–0.1)
BASOS PCT: 0 %
EOS ABS: 0 10*3/uL (ref 0.0–0.5)
EOS PCT: 0 %
HEMATOCRIT: 37.7 % — AB (ref 38.4–49.9)
Hemoglobin: 12.5 g/dL — ABNORMAL LOW (ref 13.0–17.1)
Lymphocytes Relative: 13 %
Lymphs Abs: 1.5 10*3/uL (ref 0.9–3.3)
MCH: 31.6 pg (ref 27.2–33.4)
MCHC: 33.2 g/dL (ref 32.0–36.0)
MCV: 95.2 fL (ref 79.3–98.0)
MONOS PCT: 8 %
Monocytes Absolute: 0.9 10*3/uL (ref 0.1–0.9)
NEUTROS ABS: 9.2 10*3/uL — AB (ref 1.5–6.5)
Neutrophils Relative %: 79 %
PLATELETS: 222 10*3/uL (ref 140–400)
RBC: 3.96 MIL/uL — ABNORMAL LOW (ref 4.20–5.82)
RDW: 15.5 % — AB (ref 11.0–14.6)
WBC: 11.6 10*3/uL — ABNORMAL HIGH (ref 4.0–10.3)

## 2017-07-29 LAB — PHOSPHORUS: PHOSPHORUS: 2.7 mg/dL (ref 2.5–4.6)

## 2017-07-29 LAB — LACTATE DEHYDROGENASE: LDH: 137 U/L (ref 125–245)

## 2017-07-30 ENCOUNTER — Telehealth: Payer: Self-pay

## 2017-07-30 NOTE — Telephone Encounter (Signed)
Per 6/4 no los °

## 2017-07-30 NOTE — Telephone Encounter (Signed)
Follow per scheduled appointment. Per 6/4 los

## 2017-07-30 NOTE — Telephone Encounter (Signed)
Called and informed patient to come on regular scheduled appointed day. Per 6/4 los

## 2017-08-05 NOTE — Progress Notes (Signed)
HEMATOLOGY/ONCOLOGY CLINIC NOTE  Date of Service: 08/06/17  Patient Care Team: Lorene Dy, MD as PCP - General (Internal Medicine)  CHIEF COMPLAINTS/PURPOSE OF CONSULTATION:  F/u for mx of diffuse large B-Cell Lymphoma  HISTORY OF PRESENTING ILLNESS:   Jonathan Marshall is a wonderful 84 y.o. male who has been referred to [place] by urologist Dr Alexis Frock for evaluation and management of newly diagnosed diffuse large B Cell Lymphoma. He is accompanied today by his wife. The pt reports that he is doing well overall.   The pt reports that in 1993 he had colon cancer with two tumors in the ascending and descending colon, which was picked up from a routine physical. He notes that he was treated with chemotherapy with Levamisole and Leukovorin for 6 months. He has not had any issues with the colon cancer since. He continue to see Dr. Cristina Gong for his GI.   He notes that he has had blood in the urine for a year and a half, picked up in a physical. This prompted his TURP surgery in July 2018. He notes that the bleeding persisted after the surgery. He notes that a stent was placed which has significantly reduced his hematuria. In February 2019 his left uretral mass was identified and subsequently biopsied confirming adenocarcinoma. The pt notes that Dr. Tresa Moore has up to this point been considering removing one of his kidneys, which would have a bearing on our treatment.   A 04/21/17 CT Abdomen identified a right retroperitoneal mass which was biopsied on 05/08/17 which revealed Large B-Cell Lymphoma.   He notes no ongoing health problems and is without physical limitations. He denies having had any blood transfusions.   On 04/23/17 the pt had a Uretral biopsy revealing Ureter, biopsy, Left mass- SUPERFICIAL FRAGMENTS OF UROTHELIAL CARCINOMA ASSOCIATED WITH GRANULATION TISSUE AND NECROSIS.   CT Abdomen of 04/21/17 revealed large right para-aortic retroperitoneal nodal conglomeration in the abdomen,  just inferior to the right renal artery. Imaging features compatible with metastatic disease in this patient with a history of prostate and left urothelial cancer. 2. Aortic atherosclerosis.   Soft Tissue Needle/core biopsy completed on 05/08/17 with results revealing Large B-Cell Lymphoma.   Most recent lab results (05/08/17) of CBC  is as follows: all values are WNL except for WBC at 11.3k, Monocytes Abs at 1.1k, CO2 at 19, Glucose at 109, Creatinine at 1.54, ALT at 16.  On review of systems, pt reports infrequent hematuria, mild back pain associated with golfing, and denies leg swelling, flank pain, fevers, chills, night sweats and any other symptoms.   On PMHx the pt reports Colon cancer in 1993, hypercholesterolemia, glaucoma, low risk prostate cancer, mastoiditis, hernia repair, carpel tunnel. He denies heart or lung problems.  On Social Hx the pt denies smoking and ETOH consumption. On Family Hx the pt reports that his mother had breast cancer when she was roughly 11, and also cervical cancer. On his mother's side, his aunts and uncles had several cancers including lung. Maternal grandfather had prostate cancer.   Interval History:  Jonathan Marshall returns today regarding his Diffuse Large B-cell Lymphoma. [name] was on 07/29/17. The pt reports that he is doing well overall.   The pt will be proceeding with C4 and hopes to have surgery with Dr Tresa Moore after C4.   Lab results today (08/06/17) of CBC, CMP, and Reticulocytes is as follows: all values are WNL except for RBC at 3.83, HGB at 12.1,  HCT at 38.1, MCV at 99.5, MCHC at 31.8, RDw at 16.5, Chloride at 112, CO2 at 20, Glucose at 164, Creatinine at 1.43, Total bilirubin at <0.2, GFR at 44.  On review of systems, pt reports eating well, weight gain, better energy levels, and denies abdominal pains, leg swelling, and any other symptoms.    MEDICAL HISTORY:  Past Medical History:  Diagnosis Date  . BPH (benign  prostatic hyperplasia)   . Cancer [facility]) 1993   Colon  . DDD (degenerative disc disease), lumbar    L4-L5  . Depression   . Glaucoma, both eyes   . Hearing loss   . Hematuria, gross   . History of adenomatous polyp of colon   . History of carpal tunnel syndrome    left  . History of colon cancer    dx 1993  s/p  hemicolectomy (malignant tumor x2)  and chemo therapy completed 1993--- no recurrence per pt  . History of shingles   . Hyperlipidemia   . Insomnia     SURGICAL HISTORY: Past Surgical History:  Procedure Laterality Date  . APPENDECTOMY    . CARPAL TUNNEL RELEASE Left 08/13/2013   Procedure: CARPAL TUNNEL RELEASE LEFT WRIST;  Surgeon: Yvette Rack., MD;  Location: [facility];  Service: Orthopedics;  Laterality: Left;  . COLONOSCOPY    . CYSTOSCOPY W/ RETROGRADES Bilateral 04/03/2017   Procedure: CYSTOSCOPY WITH RETROGRADE PYELOGRAM, LEFT URETEROSCOPY, LEFT STENT PLACEMENT;  Surgeon: Franchot Gallo, MD;  Location: [facility];  Service: Urology;  Laterality: Bilateral;  . CYSTOSCOPY W/ URETERAL STENT PLACEMENT Left 04/23/2017   Procedure: CYSTOSCOPY WITH STENT REPLACEMENT;  Surgeon: Alexis Frock, MD;  Location: [facility];  Service: Urology;  Laterality: Left;  . CYSTOSCOPY WITH FULGERATION Left 04/03/2017   Procedure: LEFT URETERAL BIOPSY;  Surgeon: Franchot Gallo, MD;  Location[facility];  Service: Urology;  Laterality: Left;  . CYSTOSCOPY/RETROGRADE/URETEROSCOPY Left 04/23/2017   Procedure: CYSTOSCOPY/RETROGRADE/URETEROSCOPY, LEFT  URETEROSCOPY WITY  BIOPSY;  Surgeon: Alexis Frock, MD;  Location: [facility];  Service: Urology;  Laterality: Left;  . HEMICOLECTOMY  1993   malignant tumor x2 , colon cancer and Appendectomy  . INGUINAL HERNIA REPAIR Right 2003  . IR FLUORO GUIDE PORT INSERTION RIGHT  05/30/2017  . IR US GUIDE VASC ACCESS RIGHT  05/30/2017  . MASTOIDECTOMY Right 2015  .  SIGMOIDOSCOPY  last one 06/ 2018  . TRANSURETHRAL RESECTION OF PROSTATE N/A 09/02/2016   Procedure: TRANSURETHRAL RESECTION OF THE PROSTATE (TURP);  Surgeon: Franchot Gallo, MD;  Location[facility]  Service: Urology;  Laterality: N/A;    SOCIAL HISTORY: Social History   Socioeconomic History  . Marital status: Married    Spouse name: Not on file  . Number of children: Not on file  . Years of education: Not on file  . Highest education level: Not on file  Occupational History  . Not on file  Social Needs  . Financial resource strain: Not on file  . Food insecurity:    Worry: Not on file    Inability: Not on file  . Transportation needs:    Medical: Not on file    Non-medical: Not on file  Tobacco Use  . Smoking status: Never Smoker  . Smokeless tobacco: Never Used  Substance and Sexual Activity  . Alcohol use: Yes    Alcohol/week: 0.6 oz    Types: 1 Glasses of wine per week  Comment: once per week  . Drug use: No  . Sexual activity: Not on file  Lifestyle  . Physical activity:    Days per week: Not on file    Minutes per session: Not on file  . Stress: Not on file  Relationships  . Social connections:    Talks on phone: Not on file    Gets together: Not on file    Attends religious service: Not on file    Active member of club or organization: Not on file    Attends meetings of clubs or organizations: Not on file    Relationship status: Not on file  . Intimate partner violence:    Fear of current or ex partner: Not on file    Emotionally abused: Not on file    Physically abused: Not on file    Forced sexual activity: Not on file  Other Topics Concern  . Not on file  Social History Narrative  . Not on file    FAMILY HISTORY: History reviewed. No pertinent family history.  ALLERGIES:  has No Known Allergies.  MEDICATIONS:  Current Outpatient Medications  Medication Sig Dispense Refill  . clotrimazole (LOTRIMIN) 1 % cream Apply 1  application topically 2 (two) times daily as needed (for foot rash).    . diphenhydramine-acetaminophen (TYLENOL PM) 25-500 MG TABS tablet Take 2 tablets by mouth at bedtime.     . fluticasone (FLONASE) 50 MCG/ACT nasal spray Place 1 spray into both nostrils as needed.     . latanoprost (XALATAN) 0.005 % ophthalmic solution Place 1 drop into both eyes at bedtime.    . lidocaine-prilocaine (EMLA) cream Apply 1 application topically as needed. For use one hour prior to port access. Place quarter-size amount over port and cover with press-n-seal. 30 g 0  . LORazepam (ATIVAN) 0.5 MG tablet Take 1 tablet (0.5 mg total) by mouth every 6 (six) hours as needed (Nausea or vomiting). 30 tablet 0  . lovastatin (MEVACOR) 40 MG tablet Take 40 mg by mouth every evening.    . ondansetron (ZOFRAN) 8 MG tablet Take 1 tablet (8 mg total) by mouth 2 (two) times daily as needed for refractory nausea / vomiting. Start on day 3 after cytoxan chemorx 30 tablet 1  . predniSONE (DELTASONE) 20 MG tablet Take 3 tablets (60 mg total) by mouth daily. Take on days 1-5 of chemotherapy. 15 tablet 6  . prochlorperazine (COMPAZINE) 10 MG tablet Take 1 tablet (10 mg total) by mouth every 6 (six) hours as needed (Nausea or vomiting). 30 tablet 6  . sertraline (ZOLOFT) 25 MG tablet Take 25 mg by mouth every morning.      No current facility-administered medications for this visit.     REVIEW OF SYSTEMS:    A 10+ POINT REVIEW OF SYSTEMS WAS OBTAINED including neurology, dermatology, psychiatry, cardiac, respiratory, lymph, extremities, GI, GU, Musculoskeletal, constitutional, breasts, reproductive, HEENT.  All pertinent positives are noted in the HPI.  All others are negative.    PHYSICAL EXAMINATION: ECOG PERFORMANCE STATUS: 1 - Symptomatic but completely ambulatory  Vitals:   08/06/17 0931  BP: 121/61  Pulse: 88  Resp: 19  Temp: 97.8 F (36.6 C)  SpO2: 98%   Filed Weights   08/06/17 0931  Weight: 203 lb (92.1 kg)    .Body mass index is 29.98 kg/m.  GENERAL:alert, in no acute distress and comfortable SKIN: no acute rashes, no significant lesions EYES: conjunctiva are pink and non-injected, sclera anicteric OROPHARYNX: MMM, no  exudates, no oropharyngeal erythema or ulceration NECK: supple, no JVD LYMPH:  no palpable lymphadenopathy in the cervical, axillary or inguinal regions LUNGS: clear to auscultation b/l with normal respiratory effort HEART: regular rate & rhythm ABDOMEN:  normoactive bowel sounds , non tender, not distended. Extremity: no pedal edema PSYCH: alert & oriented x 3 with fluent speech NEURO: no focal motor/sensory deficits   LABORATORY DATA:  I have reviewed the data as listed  . CBC Latest Ref Rng & Units 08/06/2017 07/29/2017 07/16/2017  WBC 4.0 - 10.3 K/uL 7.6 11.6(H) 6.9  Hemoglobin 13.0 - 17.1 g/dL 12.1(L) 12.5(L) 12.9(L)  Hematocrit 38.4 - 49.9 % 38.1(L) 37.7(L) 39.8  Platelets 140 - 400 K/uL 304 222 271    . CMP Latest Ref Rng & Units 08/06/2017 07/29/2017 07/16/2017  Glucose 70 - 140 mg/dL 164(H) 146(H) 167(H)  BUN 7 - 26 mg/dL 15 17 13   Creatinine 0.70 - 1.30 mg/dL 1.43(H) 1.74(H) 1.56(H)  Sodium 136 - 145 mmol/L 141 137 141  Potassium 3.5 - 5.1 mmol/L 3.6 3.6 3.7  Chloride 98 - 109 mmol/L 112(H) 111(H) 110(H)  CO2 22 - 29 mmol/L 20(L) 17(L) 22  Calcium 8.4 - 10.4 mg/dL 9.4 9.5 9.5  Total Protein 6.4 - 8.3 g/dL 6.6 6.9 6.5  Total Bilirubin 0.2 - 1.2 mg/dL <0.2(L) <0.2(L) 0.2  Alkaline Phos 40 - 150 U/L 57 74 62  AST 5 - 34 U/L 11 13 12   ALT 0 - 55 U/L 11 12 16    Component     Latest Ref Rng & Units 05/15/2017  Retic Ct Pct     0.8 - 1.8 % 1.6  RBC.     4.20 - 5.82 MIL/uL 4.62  Retic Count, Absolute     34.8 - 93.9 K/uL 73.9  Prothrombin Time     11.4 - 15.2 seconds 14.5  INR      1.13  APTT     24 - 36 seconds 31  HIV Screen 4th Generation wRfx     Non Reactive Non Reactive  HCV Ab     0.0 - 0.9 s/co ratio <0.1  Hep B Core Ab, Tot     Negative  Negative  Hepatitis B Surface Ag     Negative Negative  LDH     125 - 245 U/L 132   . Lab Results  Component Value Date   LDH 137 07/29/2017     05/08/17 Soft Tissue Needle Core Biopsy:   04/23/17 Ureter Biopsy:   RADIOGRAPHIC STUDIES: I have personally reviewed the radiological images as listed and agreed with the findings in the report. Nm Pet Image Restag (ps) Skull Base To Thigh  Result Date: 07/28/2017 CLINICAL DATA:  Subsequent treatment strategy for diffuse large B-cell lymphoma. EXAM: NUCLEAR MEDICINE PET SKULL BASE TO THIGH TECHNIQUE: 10.5 mCi F-18 FDG was injected intravenously. Full-ring PET imaging was performed from the skull base to thigh after the radiotracer. CT data was obtained and used for attenuation correction and anatomic localization. Fasting blood glucose: 116 mg/dl COMPARISON:  PET-CT 05/29/2017.  Abdominopelvic CT 04/21/2017. FINDINGS: Mediastinal blood pool activity: SUV max 2.7. Hepatic activity SUV max 4.0. NECK: No hypermetabolic cervical lymph nodes are identified.There are no lesions of the pharyngeal mucosal space. Incidental CT findings: none CHEST: There are no hypermetabolic mediastinal, hilar or axillary lymph nodes. There is no hypermetabolic pulmonary activity. Incidental CT findings: Right IJ Port-A-Cath extends to the superior cavoatrial junction. There is atherosclerosis of the aorta and coronary arteries. 4  mm perifissural nodule along the left major fissure on image 39/8 is stable. No new or enlarging pulmonary nodules. ABDOMEN/PELVIS: There is no hypermetabolic activity within the liver, adrenal glands, spleen or pancreas. There has been marked interval improvement in the previously demonstrated large hypermetabolic retroperitoneal mass. The residual mass measures approximately 2.9 x 2.7 cm on image 134/4 and demonstrates metabolic activity similar to background liver activity (SUV max 4.1). The other small hypermetabolic node previously noted at the  aortic bifurcation is no longer present. Incidental CT findings: Double-J left ureteral stent remains in place and is patent. There is a right renal cyst and atherosclerosis of the aorta and its branches. SKELETON: There is no hypermetabolic activity to suggest osseous metastatic disease. Generalized increased marrow activity is attributed to response to interval therapy. Incidental CT findings: Mild lumbar spondylosis, stable. IMPRESSION: 1. Significant response to interval therapy. The large retroperitoneal mass is significantly smaller with minimal residual activity, slightly greater than blood pool (Deauville 3). No evidence of disease progression. 2. Generalized increased marrow activity attributed to interval therapy. 3. Stable incidental findings including coronary and Aortic Atherosclerosis (ICD10-I70.0). 4. Patent left ureteral stent. Electronically Signed   By: Richardean Sale M.D.   On: 07/28/2017 09:40   04/21/17 CT Abdomen   ASSESSMENT & PLAN:   84 y/o male with  1. Recently diagnosed Diffuse Large B Cell Lymphoma - Stage II -bulky  05/26/17 PET/CT which showed 1. 5 cm right-sided retroperitoneal nodal mass is hypermetabolic and consistent with known lymphoma. Small lymph node just below the aortic bifurcation is also hypermetabolic. -ECHO shows normal EF.  S/[p 3 cycles of R-mini CHOP PLAN -previously Discussed 07/28/17 PET/CT which revealed Significant response to interval therapy. The large retroperitoneal mass is significantly smaller with minimal residual activity, slightly greater than blood pool (Deauville 3). No evidence of disease progression.  -Recommend Ensure/boost for nutrition supplement -Discussed pt labwork today, 08/06/17; blood counts and chemistries are stable -After discussion with urologist Dr Tresa Moore, pt will continue with C4 with impending surgery after C4 -Encouraged pt to have a detailed discussion with Dr Tresa Moore regarding his kidney functions and impact on his renal  function of a unilateral nephrectomy. -Would like skin incisions to be completely healed, most likely 2-3 weeks post surgery or 4 weeks for deeper incisions, before C5 -Will see pt back in 2 weeks for blood count clearance for surgery -Counseled pt to continue eating well, staying active and hydrated -While receiving chemotherapy, use popsicles and ice chips to minimize chemotherapy exposure to mouth/throat -Salt and baking soda mouthwash  -Holding C5 and C6 and will maintain same dose of Doxirubicin as C3 for C4 -The pt has no prohibitive toxicities from continuing treatment at this time.    2. 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  -Surgery for the uretral mass will likely best be saved until after treatment of his lymphoma.  -The patient has had hematuria for a year and a half, which may indicate a fairly well behaved tumor and the ability to confidently prioritize his lymphoma which would be rapidly progressive without treatment. -plan as above -continue urologic f/u with Dr Tresa Moore. -I discussed surgical timeline with Dr Tresa Moore and decided that we will proceed with C4 prior to impending surgery likely in july -Discussed with pt that surgery would need to be preceded by 3 weeks off of chemotherapy -patient encourage to discuss with extent of loss of renal function with his  urologist as it   RTC with Dr Irene Limbo in 2 weeks with labs Discontinue appointments if any for C5 and C6 of chemotherapy and labs    All of the patients questions were answered with apparent satisfaction. The patient knows to call the clinic with any problems, questions or concerns.  The total time spent in the appt was 25 minutes and more than 50% was on counseling and direct patient cares.   Sullivan Lone MD MS AAHIVMS [facility] Hematology/Oncology Physician [facility]  (Office):       [phone] (Work cell):   [phone] (Fax):           [phone]  08/06/2017 10:06 AM  I, Baldwin Jamaica, am acting as a Education administrator for Dr Irene Limbo.   .I have reviewed the above documentation for accuracy and completeness, and I agree with the above. Brunetta Genera MD

## 2017-08-06 ENCOUNTER — Inpatient Hospital Stay: Payer: Medicare Other

## 2017-08-06 ENCOUNTER — Inpatient Hospital Stay (HOSPITAL_BASED_OUTPATIENT_CLINIC_OR_DEPARTMENT_OTHER): Payer: Medicare Other | Admitting: Hematology

## 2017-08-06 ENCOUNTER — Encounter: Payer: Self-pay | Admitting: Hematology

## 2017-08-06 VITALS — BP 121/61 | HR 88 | Temp 97.8°F | Resp 19 | Ht 69.0 in | Wt 203.0 lb

## 2017-08-06 VITALS — BP 110/56 | HR 73 | Temp 97.1°F | Resp 18

## 2017-08-06 DIAGNOSIS — C8330 Diffuse large B-cell lymphoma, unspecified site: Secondary | ICD-10-CM

## 2017-08-06 DIAGNOSIS — C8333 Diffuse large B-cell lymphoma, intra-abdominal lymph nodes: Secondary | ICD-10-CM

## 2017-08-06 DIAGNOSIS — Z5111 Encounter for antineoplastic chemotherapy: Secondary | ICD-10-CM

## 2017-08-06 DIAGNOSIS — C662 Malignant neoplasm of left ureter: Secondary | ICD-10-CM | POA: Diagnosis not present

## 2017-08-06 DIAGNOSIS — Z95828 Presence of other vascular implants and grafts: Secondary | ICD-10-CM

## 2017-08-06 DIAGNOSIS — C689 Malignant neoplasm of urinary organ, unspecified: Secondary | ICD-10-CM

## 2017-08-06 DIAGNOSIS — Z85038 Personal history of other malignant neoplasm of large intestine: Secondary | ICD-10-CM

## 2017-08-06 LAB — CMP (CANCER CENTER ONLY)
ALBUMIN: 3.5 g/dL (ref 3.5–5.0)
ALT: 11 U/L (ref 0–55)
ANION GAP: 9 (ref 3–11)
AST: 11 U/L (ref 5–34)
Alkaline Phosphatase: 57 U/L (ref 40–150)
BUN: 15 mg/dL (ref 7–26)
CHLORIDE: 112 mmol/L — AB (ref 98–109)
CO2: 20 mmol/L — AB (ref 22–29)
Calcium: 9.4 mg/dL (ref 8.4–10.4)
Creatinine: 1.43 mg/dL — ABNORMAL HIGH (ref 0.70–1.30)
GFR, Est AFR Am: 51 mL/min — ABNORMAL LOW (ref >60)
GFR, Estimated: 44 mL/min — ABNORMAL LOW (ref >60)
GLUCOSE: 164 mg/dL — AB (ref 70–140)
POTASSIUM: 3.6 mmol/L (ref 3.5–5.1)
SODIUM: 141 mmol/L (ref 136–145)
TOTAL PROTEIN: 6.6 g/dL (ref 6.4–8.3)
Total Bilirubin: <0.2 mg/dL — ABNORMAL LOW (ref 0.2–1.2)

## 2017-08-06 LAB — CBC WITH DIFFERENTIAL (CANCER CENTER ONLY)
BASOS ABS: 0 10*3/uL (ref 0.0–0.1)
Basophils Relative: 0 %
Eosinophils Absolute: 0 10*3/uL (ref 0.0–0.5)
Eosinophils Relative: 0 %
HEMATOCRIT: 38.1 % — AB (ref 38.4–49.9)
Hemoglobin: 12.1 g/dL — ABNORMAL LOW (ref 13.0–17.1)
LYMPHS PCT: 18 %
Lymphs Abs: 1.4 10*3/uL (ref 0.9–3.3)
MCH: 31.6 pg (ref 27.2–33.4)
MCHC: 31.8 g/dL — ABNORMAL LOW (ref 32.0–36.0)
MCV: 99.5 fL — AB (ref 79.3–98.0)
MONO ABS: 0.9 10*3/uL (ref 0.1–0.9)
MONOS PCT: 12 %
NEUTROS ABS: 5.2 10*3/uL (ref 1.5–6.5)
Neutrophils Relative %: 70 %
Platelet Count: 304 10*3/uL (ref 140–400)
RBC: 3.83 MIL/uL — ABNORMAL LOW (ref 4.20–5.82)
RDW: 16.5 % — AB (ref 11.0–14.6)
WBC Count: 7.6 10*3/uL (ref 4.0–10.3)

## 2017-08-06 LAB — RETICULOCYTES
RBC.: 3.83 MIL/uL — ABNORMAL LOW (ref 4.20–5.82)
RETIC COUNT ABSOLUTE: 68.9 10*3/uL (ref 34.8–93.9)
Retic Ct Pct: 1.8 % (ref 0.8–1.8)

## 2017-08-06 MED ORDER — DIPHENHYDRAMINE HCL 25 MG PO CAPS
50.0000 mg | ORAL_CAPSULE | Freq: Once | ORAL | Status: AC
  Administered 2017-08-06: 50 mg via ORAL

## 2017-08-06 MED ORDER — PALONOSETRON HCL INJECTION 0.25 MG/5ML
INTRAVENOUS | Status: AC
  Filled 2017-08-06: qty 5

## 2017-08-06 MED ORDER — DOXORUBICIN HCL CHEMO IV INJECTION 2 MG/ML
50.0000 mg/m2 | Freq: Once | INTRAVENOUS | Status: AC
  Administered 2017-08-06: 110 mg via INTRAVENOUS
  Filled 2017-08-06: qty 55

## 2017-08-06 MED ORDER — SODIUM CHLORIDE 0.9 % IV SOLN
500.0000 mg/m2 | Freq: Once | INTRAVENOUS | Status: AC
  Administered 2017-08-06: 1100 mg via INTRAVENOUS
  Filled 2017-08-06: qty 55

## 2017-08-06 MED ORDER — SODIUM CHLORIDE 0.9% FLUSH
10.0000 mL | INTRAVENOUS | Status: DC | PRN
  Administered 2017-08-06: 10 mL
  Filled 2017-08-06: qty 10

## 2017-08-06 MED ORDER — VINCRISTINE SULFATE CHEMO INJECTION 1 MG/ML
1.0000 mg | Freq: Once | INTRAVENOUS | Status: AC
  Administered 2017-08-06: 1 mg via INTRAVENOUS
  Filled 2017-08-06: qty 1

## 2017-08-06 MED ORDER — ACETAMINOPHEN 325 MG PO TABS
650.0000 mg | ORAL_TABLET | Freq: Once | ORAL | Status: AC
  Administered 2017-08-06: 650 mg via ORAL

## 2017-08-06 MED ORDER — HEPARIN SOD (PORK) LOCK FLUSH 100 UNIT/ML IV SOLN
500.0000 [IU] | Freq: Once | INTRAVENOUS | Status: AC | PRN
  Administered 2017-08-06: 500 [IU]
  Filled 2017-08-06: qty 5

## 2017-08-06 MED ORDER — SODIUM CHLORIDE 0.9 % IV SOLN
375.0000 mg/m2 | Freq: Once | INTRAVENOUS | Status: AC
  Administered 2017-08-06: 800 mg via INTRAVENOUS
  Filled 2017-08-06: qty 50

## 2017-08-06 MED ORDER — SODIUM CHLORIDE 0.9% FLUSH
10.0000 mL | Freq: Once | INTRAVENOUS | Status: AC
  Administered 2017-08-06: 10 mL
  Filled 2017-08-06: qty 10

## 2017-08-06 MED ORDER — DEXAMETHASONE SODIUM PHOSPHATE 10 MG/ML IJ SOLN
INTRAMUSCULAR | Status: AC
  Filled 2017-08-06: qty 1

## 2017-08-06 MED ORDER — DIPHENHYDRAMINE HCL 25 MG PO CAPS
ORAL_CAPSULE | ORAL | Status: AC
  Filled 2017-08-06: qty 2

## 2017-08-06 MED ORDER — ACETAMINOPHEN 325 MG PO TABS
ORAL_TABLET | ORAL | Status: AC
  Filled 2017-08-06: qty 2

## 2017-08-06 MED ORDER — SODIUM CHLORIDE 0.9 % IV SOLN
Freq: Once | INTRAVENOUS | Status: AC
  Administered 2017-08-06: 09:00:00 via INTRAVENOUS

## 2017-08-06 MED ORDER — PALONOSETRON HCL INJECTION 0.25 MG/5ML
0.2500 mg | Freq: Once | INTRAVENOUS | Status: AC
  Administered 2017-08-06: 0.25 mg via INTRAVENOUS

## 2017-08-06 MED ORDER — DEXAMETHASONE SODIUM PHOSPHATE 10 MG/ML IJ SOLN
10.0000 mg | Freq: Once | INTRAMUSCULAR | Status: AC
  Administered 2017-08-06: 10 mg via INTRAVENOUS

## 2017-08-06 NOTE — Patient Instructions (Signed)
[facility] Discharge Instructions for Patients Receiving Chemotherapy  Today you received the following chemotherapy agents Adriamycin, Vincristine, Cytoxan, Rituxan.  To help prevent nausea and vomiting after your treatment, we encourage you to take your nausea medication as directed.  If you develop nausea and vomiting that is not controlled by your nausea medication, call the clinic.   BELOW ARE SYMPTOMS THAT SHOULD BE REPORTED IMMEDIATELY:  *FEVER GREATER THAN 100.5 F  *CHILLS WITH OR WITHOUT FEVER  NAUSEA AND VOMITING THAT IS NOT CONTROLLED WITH YOUR NAUSEA MEDICATION  *UNUSUAL SHORTNESS OF BREATH  *UNUSUAL BRUISING OR BLEEDING  TENDERNESS IN MOUTH AND THROAT WITH OR WITHOUT PRESENCE OF ULCERS  *URINARY PROBLEMS  *BOWEL PROBLEMS  UNUSUAL RASH Items with * indicate a potential emergency and should be followed up as soon as possible.  Feel free to call the clinic should you have any questions or concerns. The clinic phone number is [phone].  Please show the CHEMO ALERT CARD at check-in to the Emergency Department and triage nurse.   

## 2017-08-08 ENCOUNTER — Inpatient Hospital Stay: Payer: Medicare Other

## 2017-08-08 VITALS — BP 130/64 | HR 80 | Temp 97.7°F | Resp 20

## 2017-08-08 DIAGNOSIS — C8330 Diffuse large B-cell lymphoma, unspecified site: Secondary | ICD-10-CM

## 2017-08-08 DIAGNOSIS — Z5111 Encounter for antineoplastic chemotherapy: Secondary | ICD-10-CM | POA: Diagnosis not present

## 2017-08-08 MED ORDER — PEGFILGRASTIM-CBQV 6 MG/0.6ML ~~LOC~~ SOSY
6.0000 mg | PREFILLED_SYRINGE | Freq: Once | SUBCUTANEOUS | Status: AC
  Administered 2017-08-08: 6 mg via SUBCUTANEOUS

## 2017-08-08 NOTE — Patient Instructions (Signed)
Pegfilgrastim injection What is this medicine? PEGFILGRASTIM (PEG fil gra stim) is a long-acting granulocyte colony-stimulating factor that stimulates the growth of neutrophils, a type of white blood cell important in the body's fight against infection. It is used to reduce the incidence of fever and infection in patients with certain types of cancer who are receiving chemotherapy that affects the bone marrow, and to increase survival after being exposed to high doses of radiation. This medicine may be used for other purposes; ask your health care provider or pharmacist if you have questions. COMMON BRAND NAME(S): Neulasta What should I tell my health care provider before I take this medicine? They need to know if you have any of these conditions: -kidney disease -latex allergy -ongoing radiation therapy -sickle cell disease -skin reactions to acrylic adhesives (On-Body Injector only) -an unusual or allergic reaction to pegfilgrastim, filgrastim, other medicines, foods, dyes, or preservatives -pregnant or trying to get pregnant -breast-feeding How should I use this medicine? This medicine is for injection under the skin. If you get this medicine at home, you will be taught how to prepare and give the pre-filled syringe or how to use the On-body Injector. Refer to the patient Instructions for Use for detailed instructions. Use exactly as directed. Tell your healthcare provider immediately if you suspect that the On-body Injector may not have performed as intended or if you suspect the use of the On-body Injector resulted in a missed or partial dose. It is important that you put your used needles and syringes in a special sharps container. Do not put them in a trash can. If you do not have a sharps container, call your pharmacist or healthcare provider to get one. Talk to your pediatrician regarding the use of this medicine in children. While this drug may be prescribed for selected conditions,  precautions do apply. Overdosage: If you think you have taken too much of this medicine contact a poison control center or emergency room at once. NOTE: This medicine is only for you. Do not share this medicine with others. What if I miss a dose? It is important not to miss your dose. Call your doctor or health care professional if you miss your dose. If you miss a dose due to an On-body Injector failure or leakage, a new dose should be administered as soon as possible using a single prefilled syringe for manual use. What may interact with this medicine? Interactions have not been studied. Give your health care provider a list of all the medicines, herbs, non-prescription drugs, or dietary supplements you use. Also tell them if you smoke, drink alcohol, or use illegal drugs. Some items may interact with your medicine. This list may not describe all possible interactions. Give your health care provider a list of all the medicines, herbs, non-prescription drugs, or dietary supplements you use. Also tell them if you smoke, drink alcohol, or use illegal drugs. Some items may interact with your medicine. What should I watch for while using this medicine? You may need blood work done while you are taking this medicine. If you are going to need a MRI, CT scan, or other procedure, tell your doctor that you are using this medicine (On-Body Injector only). What side effects may I notice from receiving this medicine? Side effects that you should report to your doctor or health care professional as soon as possible: -allergic reactions like skin rash, itching or hives, swelling of the face, lips, or tongue -dizziness -fever -pain, redness, or irritation at site   where injected -pinpoint red spots on the skin -red or dark-brown urine -shortness of breath or breathing problems -stomach or side pain, or pain at the shoulder -swelling -tiredness -trouble passing urine or change in the amount of urine Side  effects that usually do not require medical attention (report to your doctor or health care professional if they continue or are bothersome): -bone pain -muscle pain This list may not describe all possible side effects. Call your doctor for medical advice about side effects. You may report side effects to FDA at 1-800-FDA-1088. Where should I keep my medicine? Keep out of the reach of children. Store pre-filled syringes in a refrigerator between 2 and 8 degrees C (36 and 46 degrees F). Do not freeze. Keep in carton to protect from light. Throw away this medicine if it is left out of the refrigerator for more than 48 hours. Throw away any unused medicine after the expiration date. NOTE: This sheet is a summary. It may not cover all possible information. If you have questions about this medicine, talk to your doctor, pharmacist, or health care provider.  2018 Elsevier/Gold Standard (2016-02-08 12:58:03)  

## 2017-08-12 ENCOUNTER — Other Ambulatory Visit: Payer: Self-pay | Admitting: Urology

## 2017-08-20 ENCOUNTER — Telehealth: Payer: Self-pay | Admitting: Hematology

## 2017-08-20 ENCOUNTER — Inpatient Hospital Stay (HOSPITAL_BASED_OUTPATIENT_CLINIC_OR_DEPARTMENT_OTHER): Payer: Medicare Other | Admitting: Hematology

## 2017-08-20 ENCOUNTER — Inpatient Hospital Stay: Payer: Medicare Other

## 2017-08-20 VITALS — BP 119/102 | HR 115 | Temp 97.8°F | Resp 18 | Ht 69.0 in | Wt 186.8 lb

## 2017-08-20 DIAGNOSIS — C662 Malignant neoplasm of left ureter: Secondary | ICD-10-CM

## 2017-08-20 DIAGNOSIS — N183 Chronic kidney disease, stage 3 (moderate): Secondary | ICD-10-CM

## 2017-08-20 DIAGNOSIS — Z5111 Encounter for antineoplastic chemotherapy: Secondary | ICD-10-CM | POA: Diagnosis not present

## 2017-08-20 DIAGNOSIS — Z85038 Personal history of other malignant neoplasm of large intestine: Secondary | ICD-10-CM

## 2017-08-20 DIAGNOSIS — Z8049 Family history of malignant neoplasm of other genital organs: Secondary | ICD-10-CM

## 2017-08-20 DIAGNOSIS — N179 Acute kidney failure, unspecified: Secondary | ICD-10-CM

## 2017-08-20 DIAGNOSIS — C8330 Diffuse large B-cell lymphoma, unspecified site: Secondary | ICD-10-CM

## 2017-08-20 DIAGNOSIS — R634 Abnormal weight loss: Secondary | ICD-10-CM

## 2017-08-20 DIAGNOSIS — E860 Dehydration: Secondary | ICD-10-CM

## 2017-08-20 DIAGNOSIS — C8333 Diffuse large B-cell lymphoma, intra-abdominal lymph nodes: Secondary | ICD-10-CM | POA: Diagnosis not present

## 2017-08-20 DIAGNOSIS — Z803 Family history of malignant neoplasm of breast: Secondary | ICD-10-CM | POA: Diagnosis not present

## 2017-08-20 DIAGNOSIS — R5383 Other fatigue: Secondary | ICD-10-CM

## 2017-08-20 DIAGNOSIS — Z95828 Presence of other vascular implants and grafts: Secondary | ICD-10-CM

## 2017-08-20 LAB — CMP (CANCER CENTER ONLY)
ALBUMIN: 3.8 g/dL (ref 3.5–5.0)
ALK PHOS: 80 U/L (ref 38–126)
ALT: 14 U/L (ref 0–44)
ANION GAP: 11 (ref 5–15)
AST: 12 U/L — ABNORMAL LOW (ref 15–41)
BUN: 33 mg/dL — ABNORMAL HIGH (ref 8–23)
CO2: 18 mmol/L — ABNORMAL LOW (ref 22–32)
CREATININE: 2.08 mg/dL — AB (ref 0.61–1.24)
Calcium: 10.1 mg/dL (ref 8.9–10.3)
Chloride: 108 mmol/L (ref 98–111)
GFR, EST AFRICAN AMERICAN: 32 mL/min — AB (ref >60)
GFR, EST NON AFRICAN AMERICAN: 28 mL/min — AB (ref >60)
Glucose, Bld: 132 mg/dL — ABNORMAL HIGH (ref 70–99)
Potassium: 3.6 mmol/L (ref 3.5–5.1)
Sodium: 137 mmol/L (ref 135–145)
Total Protein: 7.1 g/dL (ref 6.5–8.1)

## 2017-08-20 LAB — RETICULOCYTES
RBC.: 3.81 MIL/uL — ABNORMAL LOW (ref 4.20–5.82)
RETIC CT PCT: 3.6 % — AB (ref 0.8–1.8)
Retic Count, Absolute: 137.2 10*3/uL — ABNORMAL HIGH (ref 34.8–93.9)

## 2017-08-20 LAB — CBC WITH DIFFERENTIAL (CANCER CENTER ONLY)
Basophils Absolute: 0 10*3/uL (ref 0.0–0.1)
Basophils Relative: 0 %
EOS ABS: 0 10*3/uL (ref 0.0–0.5)
Eosinophils Relative: 0 %
HEMATOCRIT: 36.5 % — AB (ref 38.4–49.9)
HEMOGLOBIN: 12.1 g/dL — AB (ref 13.0–17.1)
LYMPHS ABS: 1.4 10*3/uL (ref 0.9–3.3)
Lymphocytes Relative: 11 %
MCH: 32 pg (ref 27.2–33.4)
MCHC: 33.2 g/dL (ref 32.0–36.0)
MCV: 96.3 fL (ref 79.3–98.0)
Monocytes Absolute: 0.8 10*3/uL (ref 0.1–0.9)
Monocytes Relative: 6 %
NEUTROS ABS: 10.8 10*3/uL — AB (ref 1.5–6.5)
NEUTROS PCT: 83 %
Platelet Count: 256 10*3/uL (ref 140–400)
RBC: 3.79 MIL/uL — ABNORMAL LOW (ref 4.20–5.82)
RDW: 16.7 % — ABNORMAL HIGH (ref 11.0–14.6)
WBC: 13.1 10*3/uL — AB (ref 4.0–10.3)

## 2017-08-20 MED ORDER — SODIUM CHLORIDE 0.9 % IV SOLN: 1000.0000 mL | Freq: Once | INTRAVENOUS | Status: AC

## 2017-08-20 MED ORDER — SODIUM CHLORIDE 0.9 % IV SOLN
Freq: Once | INTRAVENOUS | Status: AC
  Administered 2017-08-20: 15:00:00 via INTRAVENOUS

## 2017-08-20 MED ORDER — SODIUM CHLORIDE 0.9% FLUSH
10.0000 mL | Freq: Once | INTRAVENOUS | Status: AC
  Administered 2017-08-20: 10 mL
  Filled 2017-08-20: qty 10

## 2017-08-20 MED ORDER — HEPARIN SOD (PORK) LOCK FLUSH 100 UNIT/ML IV SOLN
500.0000 [IU] | Freq: Once | INTRAVENOUS | Status: AC
  Administered 2017-08-20: 500 [IU]
  Filled 2017-08-20: qty 5

## 2017-08-20 NOTE — Progress Notes (Signed)
HEMATOLOGY/ONCOLOGY CLINIC NOTE  Date of Service: 08/20/17  Patient Care Team: Lorene Dy, MD as PCP - General (Internal Medicine)  CHIEF COMPLAINTS/PURPOSE OF CONSULTATION:  F/u for mx of diffuse large B-Cell Lymphoma  HISTORY OF PRESENTING ILLNESS:   Jonathan Marshall is a wonderful 84 y.o. male who has been referred to [place] by urologist Dr Alexis Frock for evaluation and management of newly diagnosed diffuse large B Cell Lymphoma. He is accompanied today by his wife. The pt reports that he is doing well overall.   The pt reports that in 1993 he had colon cancer with two tumors in the ascending and descending colon, which was picked up from a routine physical. He notes that he was treated with chemotherapy with Levamisole and Leukovorin for 6 months. He has not had any issues with the colon cancer since. He continue to see Dr. Cristina Gong for his GI.   He notes that he has had blood in the urine for a year and a half, picked up in a physical. This prompted his TURP surgery in July 2018. He notes that the bleeding persisted after the surgery. He notes that a stent was placed which has significantly reduced his hematuria. In February 2019 his left uretral mass was identified and subsequently biopsied confirming adenocarcinoma. The pt notes that Dr. Tresa Moore has up to this point been considering removing one of his kidneys, which would have a bearing on our treatment.   A 04/21/17 CT Abdomen identified a right retroperitoneal mass which was biopsied on 05/08/17 which revealed Large B-Cell Lymphoma.   He notes no ongoing health problems and is without physical limitations. He denies having had any blood transfusions.   On 04/23/17 the pt had a Uretral biopsy revealing Ureter, biopsy, Left mass- SUPERFICIAL FRAGMENTS OF UROTHELIAL CARCINOMA ASSOCIATED WITH GRANULATION TISSUE AND NECROSIS.   CT Abdomen of 04/21/17 revealed large right para-aortic retroperitoneal nodal conglomeration in the abdomen,  just inferior to the right renal artery. Imaging features compatible with metastatic disease in this patient with a history of prostate and left urothelial cancer. 2. Aortic atherosclerosis.   Soft Tissue Needle/core biopsy completed on 05/08/17 with results revealing Large B-Cell Lymphoma.   Most recent lab results (05/08/17) of CBC  is as follows: all values are WNL except for WBC at 11.3k, Monocytes Abs at 1.1k, CO2 at 19, Glucose at 109, Creatinine at 1.54, ALT at 16.  On review of systems, pt reports infrequent hematuria, mild back pain associated with golfing, and denies leg swelling, flank pain, fevers, chills, night sweats and any other symptoms.   On PMHx the pt reports Colon cancer in 1993, hypercholesterolemia, glaucoma, low risk prostate cancer, mastoiditis, hernia repair, carpel tunnel. He denies heart or lung problems.  On Social Hx the pt denies smoking and ETOH consumption. On Family Hx the pt reports that his mother had breast cancer when she was roughly 61, and also cervical cancer. On his mother's side, his aunts and uncles had several cancers including lung. Maternal grandfather had prostate cancer.   Interval History:  Mr. Emmitte Surgeon returns today regarding his Diffuse Large B-cell Lymphoma. [name] was on 08/06/17. He is accompanied today by his wife. The pt is scheduled for a laparoscopic nephroureterectomy on 10/17/17.   The pt reports that he has had a difficult last 10 days with much more fatigue. He notes that he has not been eating well and has lost 17 pounds in the last two weeks.  He is not taking any diuretics at the time and some days he does drink the recommended 64oz of water.   Lab results today (08/20/17) of CBC, CMP, and Reticulocytes is as follows: all values are WNL except for WBC at 13.1k, RBC at 3.79, HGB at 12.1, HCT at 36.5, RDW at 16.7, ANC at 10.8k, CO2 at 18, Glucose at 132, BUN at 33, Creatinine at 2.08, AST at 12, Total Bilirubin  at <0.2, Retic ct pct at 3.6%, Retic ct abs at 137.2k.  On review of systems, pt reports fatigue, weight loss, decreased appetite, stable back pain, and denies mouth sores, abdominal pains, and any other symptoms.    MEDICAL HISTORY:  Past Medical History:  Diagnosis Date  . BPH (benign prostatic hyperplasia)   . Cancer [facility]) 1993   Colon  . DDD (degenerative disc disease), lumbar    L4-L5  . Depression   . Glaucoma, both eyes   . Hearing loss   . Hematuria, gross   . History of adenomatous polyp of colon   . History of carpal tunnel syndrome    left  . History of colon cancer    dx 1993  s/p  hemicolectomy (malignant tumor x2)  and chemo therapy completed 1993--- no recurrence per pt  . History of shingles   . Hyperlipidemia   . Insomnia     SURGICAL HISTORY: Past Surgical History:  Procedure Laterality Date  . APPENDECTOMY    . CARPAL TUNNEL RELEASE Left 08/13/2013   Procedure: CARPAL TUNNEL RELEASE LEFT WRIST;  Surgeon: Yvette Rack., MD;  Location: [facility];  Service: Orthopedics;  Laterality: Left;  . COLONOSCOPY    . CYSTOSCOPY W/ RETROGRADES Bilateral 04/03/2017   Procedure: CYSTOSCOPY WITH RETROGRADE PYELOGRAM, LEFT URETEROSCOPY, LEFT STENT PLACEMENT;  Surgeon: Franchot Gallo, MD;  Location: [facility];  Service: Urology;  Laterality: Bilateral;  . CYSTOSCOPY W/ URETERAL STENT PLACEMENT Left 04/23/2017   Procedure: CYSTOSCOPY WITH STENT REPLACEMENT;  Surgeon: Alexis Frock, MD;  Location: [facility];  Service: Urology;  Laterality: Left;  . CYSTOSCOPY WITH FULGERATION Left 04/03/2017   Procedure: LEFT URETERAL BIOPSY;  Surgeon: Franchot Gallo, MD;  Location[facility];  Service: Urology;  Laterality: Left;  . CYSTOSCOPY/RETROGRADE/URETEROSCOPY Left 04/23/2017   Procedure: CYSTOSCOPY/RETROGRADE/URETEROSCOPY, LEFT  URETEROSCOPY WITY  BIOPSY;  Surgeon: Alexis Frock, MD;  Location: [facility];  Service: Urology;  Laterality: Left;  . HEMICOLECTOMY  1993   malignant tumor x2 , colon cancer and Appendectomy  . INGUINAL HERNIA REPAIR Right 2003  . IR FLUORO GUIDE PORT INSERTION RIGHT  05/30/2017  . IR US GUIDE VASC ACCESS RIGHT  05/30/2017  . MASTOIDECTOMY Right 2015  . SIGMOIDOSCOPY  last one 06/ 2018  . TRANSURETHRAL RESECTION OF PROSTATE N/A 09/02/2016   Procedure: TRANSURETHRAL RESECTION OF THE PROSTATE (TURP);  Surgeon: Franchot Gallo, MD;  Location: [facility];  Service: Urology;  Laterality: N/A;    SOCIAL HISTORY: Social History   Socioeconomic History  . Marital status: Married    Spouse name: Not on file  . Number of children: Not on file  . Years of education: Not on file  . Highest education level: Not on file  Occupational History  . Not on file  Social Needs  . Financial resource strain: Not on file  . Food insecurity:    Worry: Not on file    Inability: Not on file  . Transportation needs:  Medical: Not on file    Non-medical: Not on file  Tobacco Use  . Smoking status: Never Smoker  . Smokeless tobacco: Never Used  Substance and Sexual Activity  . Alcohol use: Yes    Alcohol/week: 0.6 oz    Types: 1 Glasses of wine per week    Comment: once per week  . Drug use: No  . Sexual activity: Not on file  Lifestyle  . Physical activity:    Days per week: Not on file    Minutes per session: Not on file  . Stress: Not on file  Relationships  . Social connections:    Talks on phone: Not on file    Gets together: Not on file    Attends religious service: Not on file    Active member of club or organization: Not on file    Attends meetings of clubs or organizations: Not on file    Relationship status: Not on file  . Intimate partner violence:    Fear of current or ex partner: Not on file    Emotionally abused: Not on file    Physically abused: Not on file    Forced sexual activity: Not on file  Other Topics Concern    . Not on file  Social History Narrative  . Not on file    FAMILY HISTORY: No family history on file.  ALLERGIES:  has No Known Allergies.  MEDICATIONS:  Current Outpatient Medications  Medication Sig Dispense Refill  . clotrimazole (LOTRIMIN) 1 % cream Apply 1 application topically 2 (two) times daily as needed (for foot rash).    . diphenhydramine-acetaminophen (TYLENOL PM) 25-500 MG TABS tablet Take 2 tablets by mouth at bedtime.     . fluticasone (FLONASE) 50 MCG/ACT nasal spray Place 1 spray into both nostrils as needed.     . latanoprost (XALATAN) 0.005 % ophthalmic solution Place 1 drop into both eyes at bedtime.    . lidocaine-prilocaine (EMLA) cream Apply 1 application topically as needed. For use one hour prior to port access. Place quarter-size amount over port and cover with press-n-seal. 30 g 0  . LORazepam (ATIVAN) 0.5 MG tablet Take 1 tablet (0.5 mg total) by mouth every 6 (six) hours as needed (Nausea or vomiting). 30 tablet 0  . lovastatin (MEVACOR) 40 MG tablet Take 40 mg by mouth every evening.    . ondansetron (ZOFRAN) 8 MG tablet Take 1 tablet (8 mg total) by mouth 2 (two) times daily as needed for refractory nausea / vomiting. Start on day 3 after cytoxan chemorx 30 tablet 1  . predniSONE (DELTASONE) 20 MG tablet Take 3 tablets (60 mg total) by mouth daily. Take on days 1-5 of chemotherapy. 15 tablet 6  . prochlorperazine (COMPAZINE) 10 MG tablet Take 1 tablet (10 mg total) by mouth every 6 (six) hours as needed (Nausea or vomiting). 30 tablet 6  . sertraline (ZOLOFT) 25 MG tablet Take 25 mg by mouth every morning.      Current Facility-Administered Medications  Medication Dose Route Frequency Provider Last Rate Last Dose  . 0.9 %  sodium chloride infusion  1,000 mL Intravenous Once Irene Limbo Cloria Spring, MD        REVIEW OF SYSTEMS:    A 10+ POINT REVIEW OF SYSTEMS WAS OBTAINED including neurology, dermatology, psychiatry, cardiac, respiratory, lymph,  extremities, GI, GU, Musculoskeletal, constitutional, breasts, reproductive, HEENT.  All pertinent positives are noted in the HPI.  All others are negative.   PHYSICAL EXAMINATION: ECOG  PERFORMANCE STATUS: 1 - Symptomatic but completely ambulatory  Vitals:   08/20/17 1410  BP: (!) 119/102  Pulse: (!) 115  Resp: 18  Temp: 97.8 F (36.6 C)  SpO2: 99%   Filed Weights   08/20/17 1410  Weight: 186 lb 12.8 oz (84.7 kg)   .Body mass index is 27.59 kg/m.  GENERAL:alert, in no acute distress and comfortable SKIN: no acute rashes, no significant lesions EYES: conjunctiva are pink and non-injected, sclera anicteric OROPHARYNX: MMM, no exudates, no oropharyngeal erythema or ulceration NECK: supple, no JVD LYMPH:  no palpable lymphadenopathy in the cervical, axillary or inguinal regions LUNGS: clear to auscultation b/l with normal respiratory effort HEART: regular rate & rhythm ABDOMEN:  normoactive bowel sounds , non tender, not distended. Extremity: no pedal edema PSYCH: alert & oriented x 3 with fluent speech NEURO: no focal motor/sensory deficits   LABORATORY DATA:  I have reviewed the data as listed  . CBC Latest Ref Rng & Units 08/20/2017 08/06/2017 07/29/2017  WBC 4.0 - 10.3 K/uL 13.1(H) 7.6 11.6(H)  Hemoglobin 13.0 - 17.1 g/dL 12.1(L) 12.1(L) 12.5(L)  Hematocrit 38.4 - 49.9 % 36.5(L) 38.1(L) 37.7(L)  Platelets 140 - 400 K/uL 256 304 222    . CMP Latest Ref Rng & Units 08/20/2017 08/06/2017 07/29/2017  Glucose 70 - 99 mg/dL 132(H) 164(H) 146(H)  BUN 8 - 23 mg/dL 33(H) 15 17  Creatinine 0.61 - 1.24 mg/dL 2.08(H) 1.43(H) 1.74(H)  Sodium 135 - 145 mmol/L 137 141 137  Potassium 3.5 - 5.1 mmol/L 3.6 3.6 3.6  Chloride 98 - 111 mmol/L 108 112(H) 111(H)  CO2 22 - 32 mmol/L 18(L) 20(L) 17(L)  Calcium 8.9 - 10.3 mg/dL 10.1 9.4 9.5  Total Protein 6.5 - 8.1 g/dL 7.1 6.6 6.9  Total Bilirubin 0.3 - 1.2 mg/dL <0.2(L) <0.2(L) <0.2(L)  Alkaline Phos 38 - 126 U/L 80 57 74  AST 15 - 41 U/L  12(L) 11 13  ALT 0 - 44 U/L 14 11 12    Component     Latest Ref Rng & Units 05/15/2017  Retic Ct Pct     0.8 - 1.8 % 1.6  RBC.     4.20 - 5.82 MIL/uL 4.62  Retic Count, Absolute     34.8 - 93.9 K/uL 73.9  Prothrombin Time     11.4 - 15.2 seconds 14.5  INR      1.13  APTT     24 - 36 seconds 31  HIV Screen 4th Generation wRfx     Non Reactive Non Reactive  HCV Ab     0.0 - 0.9 s/co ratio <0.1  Hep B Core Ab, Tot     Negative Negative  Hepatitis B Surface Ag     Negative Negative  LDH     125 - 245 U/L 132   . Lab Results  Component Value Date   LDH 137 07/29/2017     05/08/17 Soft Tissue Needle Core Biopsy:   04/23/17 Ureter Biopsy:   RADIOGRAPHIC STUDIES: I have personally reviewed the radiological images as listed and agreed with the findings in the report. Nm Pet Image Restag (ps) Skull Base To Thigh  Result Date: 07/28/2017 CLINICAL DATA:  Subsequent treatment strategy for diffuse large B-cell lymphoma. EXAM: NUCLEAR MEDICINE PET SKULL BASE TO THIGH TECHNIQUE: 10.5 mCi F-18 FDG was injected intravenously. Full-ring PET imaging was performed from the skull base to thigh after the radiotracer. CT data was obtained and used for attenuation correction and anatomic localization. Fasting  blood glucose: 116 mg/dl COMPARISON:  PET-CT 05/29/2017.  Abdominopelvic CT 04/21/2017. FINDINGS: Mediastinal blood pool activity: SUV max 2.7. Hepatic activity SUV max 4.0. NECK: No hypermetabolic cervical lymph nodes are identified.There are no lesions of the pharyngeal mucosal space. Incidental CT findings: none CHEST: There are no hypermetabolic mediastinal, hilar or axillary lymph nodes. There is no hypermetabolic pulmonary activity. Incidental CT findings: Right IJ Port-A-Cath extends to the superior cavoatrial junction. There is atherosclerosis of the aorta and coronary arteries. 4 mm perifissural nodule along the left major fissure on image 39/8 is stable. No new or enlarging pulmonary  nodules. ABDOMEN/PELVIS: There is no hypermetabolic activity within the liver, adrenal glands, spleen or pancreas. There has been marked interval improvement in the previously demonstrated large hypermetabolic retroperitoneal mass. The residual mass measures approximately 2.9 x 2.7 cm on image 134/4 and demonstrates metabolic activity similar to background liver activity (SUV max 4.1). The other small hypermetabolic node previously noted at the aortic bifurcation is no longer present. Incidental CT findings: Double-J left ureteral stent remains in place and is patent. There is a right renal cyst and atherosclerosis of the aorta and its branches. SKELETON: There is no hypermetabolic activity to suggest osseous metastatic disease. Generalized increased marrow activity is attributed to response to interval therapy. Incidental CT findings: Mild lumbar spondylosis, stable. IMPRESSION: 1. Significant response to interval therapy. The large retroperitoneal mass is significantly smaller with minimal residual activity, slightly greater than blood pool (Deauville 3). No evidence of disease progression. 2. Generalized increased marrow activity attributed to interval therapy. 3. Stable incidental findings including coronary and Aortic Atherosclerosis (ICD10-I70.0). 4. Patent left ureteral stent. Electronically Signed   By: Richardean Sale M.D.   On: 07/28/2017 09:40   04/21/17 CT Abdomen   ASSESSMENT & PLAN:   84 y/o male with  1. Recently diagnosed Diffuse Large B Cell Lymphoma - Stage II -bulky  05/26/17 PET/CT which showed 1. 5 cm right-sided retroperitoneal nodal mass is hypermetabolic and consistent with known lymphoma. Small lymph node just below the aortic bifurcation is also hypermetabolic. -ECHO shows normal EF.  S/p 4 cycles of R-mini CHOP 07/28/17 PET/CT which revealed Significant response to interval therapy. The large retroperitoneal mass is significantly smaller with minimal residual activity, slightly  greater than blood pool (Deauville 3). No evidence of disease progression.  -Recommend Ensure/boost for nutrition supplement  PLAN Discussed pt labwork today, 08/20/17; Creatinine increased to 2.08, HGB holding at 12.1, ANC at 10.8k after Neulasta injection -Continue walking 20-30 minutes -Continue drinking 48-64 oz of water each day -Increase nutrition -IVF replacement as Creatinine has increased to 2.08  -previously Discussed -After discussion with urologist Dr Tresa Moore, pt will continue with C4 with impending surgery after C4 -Holding C5 and C6  2. 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  -The patient has had hematuria for a year and a half, which may indicate a fairly well behaved tumor and the ability to confidently prioritize his lymphoma which would be rapidly progressive without treatment. Plan -continue urologic f/u with Dr Tresa Moore. -patient encourage to discuss with extent of loss of renal function with his urologist as it  -no further chemotherapy planned at this time.  -f/u with urology for surgical consideration.   IV NS today Labs in 2 weeks RTC with Dr Irene Limbo in 2 weeks with labs   All of the patients questions were answered with apparent satisfaction. The patient knows to call the clinic  with any problems, questions or concerns.  The toal time spent in the appt was 25 minutes and more than 50% was on counseling and direct patient cares.   Sullivan Lone MD MS AAHIVMS [facility] Hematology/Oncology Physician [facility]  (Office):       [phone] (Work cell):  [phone] (Fax):           [phone]  08/20/2017 2:59 PM  I, Baldwin Jamaica, am acting as a Education administrator for Dr Irene Limbo.   .I have reviewed the above documentation for accuracy and completeness, and I agree with the above. Brunetta Genera MD

## 2017-08-20 NOTE — Telephone Encounter (Signed)
Appointment scheduled in basket message sent to Dr Irene Limbo regarding f/u appointment for 7/11/ per 6/26 los

## 2017-08-20 NOTE — Patient Instructions (Signed)
Dehydration, Adult Dehydration is a condition in which there is not enough fluid or water in the body. This happens when you lose more fluids than you take in. Important organs, such as the kidneys, brain, and heart, cannot function without a proper amount of fluids. Any loss of fluids from the body can lead to dehydration. Dehydration can range from mild to severe. This condition should be treated right away to prevent it from becoming severe. What are the causes? This condition may be caused by:  Vomiting.  Diarrhea.  Excessive sweating, such as from heat exposure or exercise.  Not drinking enough fluid, especially: ? When ill. ? While doing activity that requires a lot of energy.  Excessive urination.  Fever.  Infection.  Certain medicines, such as medicines that cause the body to lose excess fluid (diuretics).  Inability to access safe drinking water.  Reduced physical ability to get adequate water and food.  What increases the risk? This condition is more likely to develop in people:  Who have a poorly controlled long-term (chronic) illness, such as diabetes, heart disease, or kidney disease.  Who are age 65 or older.  Who are disabled.  Who live in a place with high altitude.  Who play endurance sports.  What are the signs or symptoms? Symptoms of mild dehydration may include:  Thirst.  Dry lips.  Slightly dry mouth.  Dry, warm skin.  Dizziness. Symptoms of moderate dehydration may include:  Very dry mouth.  Muscle cramps.  Dark urine. Urine may be the color of tea.  Decreased urine production.  Decreased tear production.  Heartbeat that is irregular or faster than normal (palpitations).  Headache.  Light-headedness, especially when you stand up from a sitting position.  Fainting (syncope). Symptoms of severe dehydration may include:  Changes in skin, such as: ? Cold and clammy skin. ? Blotchy (mottled) or pale skin. ? Skin that does  not quickly return to normal after being lightly pinched and released (poor skin turgor).  Changes in body fluids, such as: ? Extreme thirst. ? No tear production. ? Inability to sweat when body temperature is high, such as in hot weather. ? Very little urine production.  Changes in vital signs, such as: ? Weak pulse. ? Pulse that is more than 100 beats a minute when sitting still. ? Rapid breathing. ? Low blood pressure.  Other changes, such as: ? Sunken eyes. ? Cold hands and feet. ? Confusion. ? Lack of energy (lethargy). ? Difficulty waking up from sleep. ? Short-term weight loss. ? Unconsciousness. How is this diagnosed? This condition is diagnosed based on your symptoms and a physical exam. Blood and urine tests may be done to help confirm the diagnosis. How is this treated? Treatment for this condition depends on the severity. Mild or moderate dehydration can often be treated at home. Treatment should be started right away. Do not wait until dehydration becomes severe. Severe dehydration is an emergency and it needs to be treated in a hospital. Treatment for mild dehydration may include:  Drinking more fluids.  Replacing salts and minerals in your blood (electrolytes) that you may have lost. Treatment for moderate dehydration may include:  Drinking an oral rehydration solution (ORS). This is a drink that helps you replace fluids and electrolytes (rehydrate). It can be found at pharmacies and retail stores. Treatment for severe dehydration may include:  Receiving fluids through an IV tube.  Receiving an electrolyte solution through a feeding tube that is passed through your nose   and into your stomach (nasogastric tube, or NG tube).  Correcting any abnormalities in electrolytes.  Treating the underlying cause of dehydration. Follow these instructions at home:  If directed by your health care provider, drink an ORS: ? Make an ORS by following instructions on the  package. ? Start by drinking small amounts, about  cup (120 mL) every 5-10 minutes. ? Slowly increase how much you drink until you have taken the amount recommended by your health care provider.  Drink enough clear fluid to keep your urine clear or pale yellow. If you were told to drink an ORS, finish the ORS first, then start slowly drinking other clear fluids. Drink fluids such as: ? Water. Do not drink only water. Doing that can lead to having too little salt (sodium) in the body (hyponatremia). ? Ice chips. ? Fruit juice that you have added water to (diluted fruit juice). ? Low-calorie sports drinks.  Avoid: ? Alcohol. ? Drinks that contain a lot of sugar. These include high-calorie sports drinks, fruit juice that is not diluted, and soda. ? Caffeine. ? Foods that are greasy or contain a lot of fat or sugar.  Take over-the-counter and prescription medicines only as told by your health care provider.  Do not take sodium tablets. This can lead to having too much sodium in the body (hypernatremia).  Eat foods that contain a healthy balance of electrolytes, such as bananas, oranges, potatoes, tomatoes, and spinach.  Keep all follow-up visits as told by your health care provider. This is important. Contact a health care provider if:  You have abdominal pain that: ? Gets worse. ? Stays in one area (localizes).  You have a rash.  You have a stiff neck.  You are more irritable than usual.  You are sleepier or more difficult to wake up than usual.  You feel weak or dizzy.  You feel very thirsty.  You have urinated only a small amount of very dark urine over 6-8 hours. Get help right away if:  You have symptoms of severe dehydration.  You cannot drink fluids without vomiting.  Your symptoms get worse with treatment.  You have a fever.  You have a severe headache.  You have vomiting or diarrhea that: ? Gets worse. ? Does not go away.  You have blood or green matter  (bile) in your vomit.  You have blood in your stool. This may cause stool to look black and tarry.  You have not urinated in 6-8 hours.  You faint.  Your heart rate while sitting still is over 100 beats a minute.  You have trouble breathing. This information is not intended to replace advice given to you by your health care provider. Make sure you discuss any questions you have with your health care provider. Document Released: 02/11/2005 Document Revised: 09/08/2015 Document Reviewed: 04/07/2015 Elsevier Interactive Patient Education  2018 [employer]  

## 2017-08-27 ENCOUNTER — Other Ambulatory Visit: Payer: Medicare Other

## 2017-08-27 ENCOUNTER — Ambulatory Visit: Payer: Medicare Other | Admitting: Hematology

## 2017-08-27 ENCOUNTER — Ambulatory Visit: Payer: Medicare Other

## 2017-08-29 ENCOUNTER — Ambulatory Visit: Payer: Medicare Other | Admitting: Hematology

## 2017-08-29 ENCOUNTER — Ambulatory Visit: Payer: Medicare Other

## 2017-08-29 ENCOUNTER — Other Ambulatory Visit: Payer: Medicare Other

## 2017-09-03 NOTE — Progress Notes (Signed)
HEMATOLOGY/ONCOLOGY CLINIC NOTE  Date of Service: 09/04/17    Patient Care Team: Lorene Dy, MD as PCP - General (Internal Medicine)  CHIEF COMPLAINTS/PURPOSE OF CONSULTATION:  F/u for mx of diffuse large B-Cell Lymphoma  HISTORY OF PRESENTING ILLNESS:   Jonathan Marshall is a wonderful 84 y.o. male who has been referred to [place] by urologist Dr Alexis Frock for evaluation and management of newly diagnosed diffuse large B Cell Lymphoma. He is accompanied today by his wife. The pt reports that he is doing well overall.   The pt reports that in 1993 he had colon cancer with two tumors in the ascending and descending colon, which was picked up from a routine physical. He notes that he was treated with chemotherapy with Levamisole and Leukovorin for 6 months. He has not had any issues with the colon cancer since. He continue to see Dr. Cristina Gong for his GI.   He notes that he has had blood in the urine for a year and a half, picked up in a physical. This prompted his TURP surgery in July 2018. He notes that the bleeding persisted after the surgery. He notes that a stent was placed which has significantly reduced his hematuria. In February 2019 his left uretral mass was identified and subsequently biopsied confirming adenocarcinoma. The pt notes that Dr. Tresa Moore has up to this point been considering removing one of his kidneys, which would have a bearing on our treatment.   A 04/21/17 CT Abdomen identified a right retroperitoneal mass which was biopsied on 05/08/17 which revealed Large B-Cell Lymphoma.   He notes no ongoing health problems and is without physical limitations. He denies having had any blood transfusions.   On 04/23/17 the pt had a Uretral biopsy revealing Ureter, biopsy, Left mass- SUPERFICIAL FRAGMENTS OF UROTHELIAL CARCINOMA ASSOCIATED WITH GRANULATION TISSUE AND NECROSIS.   CT Abdomen of 04/21/17 revealed large right para-aortic retroperitoneal nodal conglomeration in the  abdomen, just inferior to the right renal artery. Imaging features compatible with metastatic disease in this patient with a history of prostate and left urothelial cancer. 2. Aortic atherosclerosis.   Soft Tissue Needle/core biopsy completed on 05/08/17 with results revealing Large B-Cell Lymphoma.   Most recent lab results (05/08/17) of CBC  is as follows: all values are WNL except for WBC at 11.3k, Monocytes Abs at 1.1k, CO2 at 19, Glucose at 109, Creatinine at 1.54, ALT at 16.  On review of systems, pt reports infrequent hematuria, mild back pain associated with golfing, and denies leg swelling, flank pain, fevers, chills, night sweats and any other symptoms.   On PMHx the pt reports Colon cancer in 1993, hypercholesterolemia, glaucoma, low risk prostate cancer, mastoiditis, hernia repair, carpel tunnel. He denies heart or lung problems.  On Social Hx the pt denies smoking and ETOH consumption. On Family Hx the pt reports that his mother had breast cancer when she was roughly 74, and also cervical cancer. On his mother's side, his aunts and uncles had several cancers including lung. Maternal grandfather had prostate cancer.   Interval History:  Jonathan Marshall returns today regarding his Diffuse Large B-cell Lymphoma. [name] was on 08/20/17. The pt reports that he is doing well overall. He also notes that his Laparoscopic Nephroureterectomy has been moved up to 09/24/17.   The pt reports that he is drinking 40-50 oz of water each day, and has increased his nutritional status and activity levels. He has been walking each day  and eating 3 meals each day.   He notes that he is passing black blood clots in his urine and notes much blood in his urine as well. He has spoken with Dr Tresa Moore about this and had his surgery moved up to 09/24/17. He is managing the associated pain with Tylenol.   Lab results today (09/04/17) of CBC w/diff, CMP is as follows: all values are WNL except  for RBC at 3.63, HGB at 12.0, HCT at 35.8, MCV at 98.4, RDW at 17.4, CO2 at 21, Glucose at 119, Creatinine at 2.00, AST at 14, Total Bilirubin at 0.2, GFR at 29. Magnesium 09/04/17 is WNL at 2.0  On review of systems, pt reports persisting hematuria with clots, eating well, staying active, hydrating, and denies any other symptoms.    MEDICAL HISTORY:  Past Medical History:  Diagnosis Date  . BPH (benign prostatic hyperplasia)   . Cancer [facility]) 1993   Colon  . DDD (degenerative disc disease), lumbar    L4-L5  . Depression   . Glaucoma, both eyes   . Hearing loss   . Hematuria, gross   . History of adenomatous polyp of colon   . History of carpal tunnel syndrome    left  . History of colon cancer    dx 1993  s/p  hemicolectomy (malignant tumor x2)  and chemo therapy completed 1993--- no recurrence per pt  . History of shingles   . Hyperlipidemia   . Insomnia     SURGICAL HISTORY: Past Surgical History:  Procedure Laterality Date  . APPENDECTOMY    . CARPAL TUNNEL RELEASE Left 08/13/2013   Procedure: CARPAL TUNNEL RELEASE LEFT WRIST;  Surgeon: Yvette Rack., MD;  Location: [place];  Service: Orthopedics;  Laterality: Left;  . COLONOSCOPY    . CYSTOSCOPY W/ RETROGRADES Bilateral 04/03/2017   Procedure: CYSTOSCOPY WITH RETROGRADE PYELOGRAM, LEFT URETEROSCOPY, LEFT STENT PLACEMENT;  Surgeon: Franchot Gallo, MD;  Location: [facility];  Service: Urology;  Laterality: Bilateral;  . CYSTOSCOPY W/ URETERAL STENT PLACEMENT Left 04/23/2017   Procedure: CYSTOSCOPY WITH STENT REPLACEMENT;  Surgeon: Alexis Frock, MD;  Location: [facility];  Service: Urology;  Laterality: Left;  . CYSTOSCOPY WITH FULGERATION Left 04/03/2017   Procedure: LEFT URETERAL BIOPSY;  Surgeon: Franchot Gallo, MD;  Location[facility];  Service: Urology;  Laterality: Left;  . CYSTOSCOPY/RETROGRADE/URETEROSCOPY Left 04/23/2017   Procedure:  CYSTOSCOPY/RETROGRADE/URETEROSCOPY, LEFT  URETEROSCOPY WITY  BIOPSY;  Surgeon: Alexis Frock, MD;  Location: [facility];  Service: Urology;  Laterality: Left;  . HEMICOLECTOMY  1993   malignant tumor x2 , colon cancer and Appendectomy  . INGUINAL HERNIA REPAIR Right 2003  . IR FLUORO GUIDE PORT INSERTION RIGHT  05/30/2017  . IR US GUIDE VASC ACCESS RIGHT  05/30/2017  . MASTOIDECTOMY Right 2015  . SIGMOIDOSCOPY  last one 06/ 2018  . TRANSURETHRAL RESECTION OF PROSTATE N/A 09/02/2016   Procedure: TRANSURETHRAL RESECTION OF THE PROSTATE (TURP);  Surgeon: Franchot Gallo, MD;  Location: [facility];  Service: Urology;  Laterality: N/A;    SOCIAL HISTORY: Social History   Socioeconomic History  . Marital status: Married    Spouse name: Not on file  . Number of children: Not on file  . Years of education: Not on file  . Highest education level: Not on file  Occupational History  . Not on file  Social Needs  . Financial resource strain: Not on file  .  Food insecurity:    Worry: Not on file    Inability: Not on file  . Transportation needs:    Medical: Not on file    Non-medical: Not on file  Tobacco Use  . Smoking status: Never Smoker  . Smokeless tobacco: Never Used  Substance and Sexual Activity  . Alcohol use: Yes    Alcohol/week: 0.6 oz    Types: 1 Glasses of wine per week    Comment: once per week  . Drug use: No  . Sexual activity: Not on file  Lifestyle  . Physical activity:    Days per week: Not on file    Minutes per session: Not on file  . Stress: Not on file  Relationships  . Social connections:    Talks on phone: Not on file    Gets together: Not on file    Attends religious service: Not on file    Active member of club or organization: Not on file    Attends meetings of clubs or organizations: Not on file    Relationship status: Not on file  . Intimate partner violence:    Fear of current or ex partner: Not on file     Emotionally abused: Not on file    Physically abused: Not on file    Forced sexual activity: Not on file  Other Topics Concern  . Not on file  Social History Narrative  . Not on file    FAMILY HISTORY: History reviewed. No pertinent family history.  ALLERGIES:  has No Known Allergies.  MEDICATIONS:  Current Outpatient Medications  Medication Sig Dispense Refill  . clotrimazole (LOTRIMIN) 1 % cream Apply 1 application topically 2 (two) times daily as needed (for foot rash).    . diphenhydramine-acetaminophen (TYLENOL PM) 25-500 MG TABS tablet Take 2 tablets by mouth at bedtime.     . fluticasone (FLONASE) 50 MCG/ACT nasal spray Place 1 spray into both nostrils as needed.     . latanoprost (XALATAN) 0.005 % ophthalmic solution Place 1 drop into both eyes at bedtime.    . lidocaine-prilocaine (EMLA) cream Apply 1 application topically as needed. For use one hour prior to port access. Place quarter-size amount over port and cover with press-n-seal. 30 g 0  . LORazepam (ATIVAN) 0.5 MG tablet Take 1 tablet (0.5 mg total) by mouth every 6 (six) hours as needed (Nausea or vomiting). 30 tablet 0  . lovastatin (MEVACOR) 40 MG tablet Take 40 mg by mouth every evening.    . ondansetron (ZOFRAN) 8 MG tablet Take 1 tablet (8 mg total) by mouth 2 (two) times daily as needed for refractory nausea / vomiting. Start on day 3 after cytoxan chemorx 30 tablet 1  . predniSONE (DELTASONE) 20 MG tablet Take 3 tablets (60 mg total) by mouth daily. Take on days 1-5 of chemotherapy. 15 tablet 6  . prochlorperazine (COMPAZINE) 10 MG tablet Take 1 tablet (10 mg total) by mouth every 6 (six) hours as needed (Nausea or vomiting). 30 tablet 6  . sertraline (ZOLOFT) 25 MG tablet Take 25 mg by mouth every morning.      No current facility-administered medications for this visit.     REVIEW OF SYSTEMS:    A 10+ POINT REVIEW OF SYSTEMS WAS OBTAINED including neurology, dermatology, psychiatry, cardiac, respiratory,  lymph, extremities, GI, GU, Musculoskeletal, constitutional, breasts, reproductive, HEENT.  All pertinent positives are noted in the HPI.  All others are negative.   PHYSICAL EXAMINATION: ECOG PERFORMANCE STATUS: 1 -  Symptomatic but completely ambulatory  Vitals:   09/04/17 1056  BP: 111/69  Pulse: 92  Resp: 18  Temp: 97.7 F (36.5 C)  SpO2: 99%   Filed Weights   09/04/17 1056  Weight: 193 lb 4.8 oz (87.7 kg)   .Body mass index is 28.55 kg/m.  GENERAL:alert, in no acute distress and comfortable SKIN: no acute rashes, no significant lesions EYES: conjunctiva are pink and non-injected, sclera anicteric OROPHARYNX: MMM, no exudates, no oropharyngeal erythema or ulceration NECK: supple, no JVD LYMPH:  no palpable lymphadenopathy in the cervical, axillary or inguinal regions LUNGS: clear to auscultation b/l with normal respiratory effort HEART: regular rate & rhythm ABDOMEN:  normoactive bowel sounds , non tender, not distended. No palpable hepatosplenomegaly.  Extremity: no pedal edema PSYCH: alert & oriented x 3 with fluent speech NEURO: no focal motor/sensory deficits   LABORATORY DATA:  I have reviewed the data as listed  . CBC Latest Ref Rng & Units 09/04/2017 08/20/2017 08/06/2017  WBC 4.0 - 10.3 K/uL 7.0 13.1(H) 7.6  Hemoglobin 13.0 - 17.1 g/dL 12.0(L) 12.1(L) 12.1(L)  Hematocrit 38.4 - 49.9 % 35.8(L) 36.5(L) 38.1(L)  Platelets 140 - 400 K/uL 230 256 304    . CMP Latest Ref Rng & Units 09/04/2017 08/20/2017 08/06/2017  Glucose 70 - 99 mg/dL 119(H) 132(H) 164(H)  BUN 8 - 23 mg/dL 29(H) 33(H) 15  Creatinine 0.61 - 1.24 mg/dL 2.00(H) 2.08(H) 1.43(H)  Sodium 135 - 145 mmol/L 139 137 141  Potassium 3.5 - 5.1 mmol/L 4.1 3.6 3.6  Chloride 98 - 111 mmol/L 110 108 112(H)  CO2 22 - 32 mmol/L 21(L) 18(L) 20(L)  Calcium 8.9 - 10.3 mg/dL 9.8 10.1 9.4  Total Protein 6.5 - 8.1 g/dL 6.9 7.1 6.6  Total Bilirubin 0.3 - 1.2 mg/dL 0.2(L) <0.2(L) <0.2(L)  Alkaline Phos 38 - 126 U/L 54  80 57  AST 15 - 41 U/L 14(L) 12(L) 11  ALT 0 - 44 U/L 14 14 11    Component     Latest Ref Rng & Units 05/15/2017  Retic Ct Pct     0.8 - 1.8 % 1.6  RBC.     4.20 - 5.82 MIL/uL 4.62  Retic Count, Absolute     34.8 - 93.9 K/uL 73.9  Prothrombin Time     11.4 - 15.2 seconds 14.5  INR      1.13  APTT     24 - 36 seconds 31  HIV Screen 4th Generation wRfx     Non Reactive Non Reactive  HCV Ab     0.0 - 0.9 s/co ratio <0.1  Hep B Core Ab, Tot     Negative Negative  Hepatitis B Surface Ag     Negative Negative  LDH     125 - 245 U/L 132   . Lab Results  Component Value Date   LDH 137 07/29/2017     05/08/17 Soft Tissue Needle Core Biopsy:   04/23/17 Ureter Biopsy:   RADIOGRAPHIC STUDIES: I have personally reviewed the radiological images as listed and agreed with the findings in the report. No results found. 04/21/17 CT Abdomen   ASSESSMENT & PLAN:   84 y/o male with  1. Recently diagnosed Diffuse Large B Cell Lymphoma - Stage II -bulky  05/26/17 PET/CT which showed 1. 5 cm right-sided retroperitoneal nodal mass is hypermetabolic and consistent with known lymphoma. Small lymph node just below the aortic bifurcation is also hypermetabolic. -ECHO shows normal EF.  S/p 4 cycles of  R-mini CHOP 07/28/17 PET/CT which revealed Significant response to interval therapy. The large retroperitoneal mass is significantly smaller with minimal residual activity, slightly greater than blood pool ([place] 3). No evidence of disease progression.  -Recommend Ensure/boost for nutrition supplement  PLAN:  -Discussed pt labwork today, 09/04/17; HGB stable at 12.0, Creatinine stable at 2.00 -Will repeat scan in 3-4 weeks after Laparoscopic Nephroureterectomy with Dr Tresa Moore on 09/24/17 -Discussed that after repeat imaging, we will consider the role of RT  -Likely obstructive element to creatinine - would not infuse with additional IVF - good po intake at this time. -Will see pt back in 6  weeks with labs  -Continue walking 20-30 minutes each day, drinking 48-60 oz of water, and eating 3 meals a day  2. 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  -The patient has had hematuria for a year and a half, which may indicate a fairly well behaved tumor and the ability to confidently prioritize his lymphoma which would be rapidly progressive without treatment. PLAN: -continue urologic f/u with Dr Tresa Moore. -no further chemotherapy planned at this time.  -f/u with urology for surgical consideration.   RTC with Dr Irene Limbo in 6 weeks with labs    All of the patients questions were answered with apparent satisfaction. The patient knows to call the clinic with any problems, questions or concerns.  The total time spent in the appt was 20 minutes and more than 50% was on counseling and direct patient cares.   Sullivan Lone MD MS AAHIVMS [facility] Hematology/Oncology Physician [facility]  (Office):       [phone] (Work cell):  [phone] (Fax):           [phone]  09/04/2017 11:39 AM  I, Baldwin Jamaica, am acting as a Education administrator for Dr Irene Limbo.   .I have reviewed the above documentation for accuracy and completeness, and I agree with the above. Brunetta Genera MD

## 2017-09-04 ENCOUNTER — Encounter: Payer: Self-pay | Admitting: Hematology

## 2017-09-04 ENCOUNTER — Inpatient Hospital Stay: Payer: Medicare Other | Attending: Hematology

## 2017-09-04 ENCOUNTER — Inpatient Hospital Stay (HOSPITAL_BASED_OUTPATIENT_CLINIC_OR_DEPARTMENT_OTHER): Payer: Medicare Other | Admitting: Hematology

## 2017-09-04 VITALS — BP 111/69 | HR 92 | Temp 97.7°F | Resp 18 | Ht 69.0 in | Wt 193.3 lb

## 2017-09-04 DIAGNOSIS — Z85038 Personal history of other malignant neoplasm of large intestine: Secondary | ICD-10-CM | POA: Insufficient documentation

## 2017-09-04 DIAGNOSIS — Z801 Family history of malignant neoplasm of trachea, bronchus and lung: Secondary | ICD-10-CM | POA: Diagnosis not present

## 2017-09-04 DIAGNOSIS — Z803 Family history of malignant neoplasm of breast: Secondary | ICD-10-CM

## 2017-09-04 DIAGNOSIS — C8333 Diffuse large B-cell lymphoma, intra-abdominal lymph nodes: Secondary | ICD-10-CM

## 2017-09-04 DIAGNOSIS — Z8042 Family history of malignant neoplasm of prostate: Secondary | ICD-10-CM | POA: Diagnosis not present

## 2017-09-04 DIAGNOSIS — C689 Malignant neoplasm of urinary organ, unspecified: Secondary | ICD-10-CM

## 2017-09-04 DIAGNOSIS — E860 Dehydration: Secondary | ICD-10-CM

## 2017-09-04 DIAGNOSIS — C8330 Diffuse large B-cell lymphoma, unspecified site: Secondary | ICD-10-CM

## 2017-09-04 DIAGNOSIS — C662 Malignant neoplasm of left ureter: Secondary | ICD-10-CM | POA: Insufficient documentation

## 2017-09-04 LAB — CBC WITH DIFFERENTIAL/PLATELET
BASOS ABS: 0.1 10*3/uL (ref 0.0–0.1)
Basophils Relative: 1 %
EOS PCT: 2 %
Eosinophils Absolute: 0.1 10*3/uL (ref 0.0–0.5)
HEMATOCRIT: 35.8 % — AB (ref 38.4–49.9)
Hemoglobin: 12 g/dL — ABNORMAL LOW (ref 13.0–17.1)
Lymphocytes Relative: 18 %
Lymphs Abs: 1.3 10*3/uL (ref 0.9–3.3)
MCH: 32.9 pg (ref 27.2–33.4)
MCHC: 33.4 g/dL (ref 32.0–36.0)
MCV: 98.4 fL — AB (ref 79.3–98.0)
MONO ABS: 0.8 10*3/uL (ref 0.1–0.9)
MONOS PCT: 11 %
NEUTROS PCT: 68 %
Neutro Abs: 4.8 10*3/uL (ref 1.5–6.5)
PLATELETS: 230 10*3/uL (ref 140–400)
RBC: 3.63 MIL/uL — ABNORMAL LOW (ref 4.20–5.82)
RDW: 17.4 % — AB (ref 11.0–14.6)
WBC: 7 10*3/uL (ref 4.0–10.3)

## 2017-09-04 LAB — CMP (CANCER CENTER ONLY)
ALT: 14 U/L (ref 0–44)
ANION GAP: 8 (ref 5–15)
AST: 14 U/L — AB (ref 15–41)
Albumin: 3.8 g/dL (ref 3.5–5.0)
Alkaline Phosphatase: 54 U/L (ref 38–126)
BILIRUBIN TOTAL: 0.2 mg/dL — AB (ref 0.3–1.2)
BUN: 29 mg/dL — ABNORMAL HIGH (ref 8–23)
CHLORIDE: 110 mmol/L (ref 98–111)
CO2: 21 mmol/L — ABNORMAL LOW (ref 22–32)
Calcium: 9.8 mg/dL (ref 8.9–10.3)
Creatinine: 2 mg/dL — ABNORMAL HIGH (ref 0.61–1.24)
GFR, EST AFRICAN AMERICAN: 34 mL/min — AB (ref >60)
GFR, Estimated: 29 mL/min — ABNORMAL LOW (ref >60)
Glucose, Bld: 119 mg/dL — ABNORMAL HIGH (ref 70–99)
Potassium: 4.1 mmol/L (ref 3.5–5.1)
Sodium: 139 mmol/L (ref 135–145)
TOTAL PROTEIN: 6.9 g/dL (ref 6.5–8.1)

## 2017-09-04 LAB — MAGNESIUM: Magnesium: 2 mg/dL (ref 1.7–2.4)

## 2017-09-17 ENCOUNTER — Ambulatory Visit: Payer: Medicare Other

## 2017-09-17 ENCOUNTER — Ambulatory Visit: Payer: Medicare Other | Admitting: Hematology

## 2017-09-17 ENCOUNTER — Other Ambulatory Visit: Payer: Medicare Other

## 2017-09-18 NOTE — Patient Instructions (Addendum)
Jonathan Marshall  09/18/2017   Your procedure is scheduled on: Wednesday 09/24/2017  Report to [facility] Main  Entrance              Report to admitting at  0930  AM    Call this number if you have problems the morning of surgery [phone]    Remember: Do not eat food  :After Midnight 09/22/2017!              Follow bowel prep instructions from Dr. Zettie Pho office the day before surgery on Tuesday 09/23/2017!              Drink Magnesium citrate -one bottle by noon day before surgery on Tuesday 09/23/2017 and drink clear liquids all day up until midnight!      CLEAR LIQUID DIET   Foods Allowed                                                                     Foods Excluded  Coffee and tea, regular and decaf                             liquids that you cannot  Plain Jell-O in any flavor                                             see through such as: Fruit ices (not with fruit pulp)                                     milk, soups, orange juice  Iced Popsicles                                    All solid food Carbonated beverages, regular and diet                                    Cranberry, grape and apple juices Sports drinks like Gatorade Lightly seasoned clear broth or consume(fat free) Sugar, honey syrup  Sample Menu Breakfast                                Lunch                                     Supper Cranberry juice                    Beef broth                            Chicken broth Jell-O  Grape juice                           Apple juice Coffee or tea                        Jell-O                                      Popsicle                                                Coffee or tea                        Coffee or tea  _____________________________________________________________________     Take these medicines the morning of surgery with A SIP OF WATER: Sertraline (Zoloft),  Cefpoodoxime                                 You may not have any metal on your body including hair pins and              piercings  Do not wear jewelry, make-up, lotions, powders or perfumes, deodorant             Do not wear nail polish.  Do not shave  48 hours prior to surgery.              Men may shave face and neck.   Do not bring valuables to the hospital. [place].  Contacts, dentures or bridgework may not be worn into surgery.  Leave suitcase in the car. After surgery it may be brought to your room.     Patients discharged the day of surgery will not be allowed to drive home.  Name and phone number of your driver:  Special Instructions: N/A              Please read over the following fact sheets you were given: _____________________________________________________________________             [facility] - Preparing for Surgery Before surgery, you can play an important role.  Because skin is not sterile, your skin needs to be as free of germs as possible.  You can reduce the number of germs on your skin by washing with CHG (chlorahexidine gluconate) soap before surgery.  CHG is an antiseptic cleaner which kills germs and bonds with the skin to continue killing germs even after washing. Please DO NOT use if you have an allergy to CHG or antibacterial soaps.  If your skin becomes reddened/irritated stop using the CHG and inform your nurse when you arrive at Short Stay. Do not shave (including legs and underarms) for at least 48 hours prior to the first CHG shower.  You may shave your face/neck. Please follow these instructions carefully:  1.  Shower with CHG Soap the night before surgery and the  morning of Surgery.  2.  If you choose to wash your hair, wash your hair first as usual with your  normal  shampoo.  3.  After you shampoo, rinse your hair and body thoroughly to remove the  shampoo.                           4.  Use CHG  as you would any other liquid soap.  You can apply chg directly  to the skin and wash                       Gently with a scrungie or clean washcloth.  5.  Apply the CHG Soap to your body ONLY FROM THE NECK DOWN.   Do not use on face/ open                           Wound or open sores. Avoid contact with eyes, ears mouth and genitals (private parts).                       Wash face,  Genitals (private parts) with your normal soap.             6.  Wash thoroughly, paying special attention to the area where your surgery  will be performed.  7.  Thoroughly rinse your body with warm water from the neck down.  8.  DO NOT shower/wash with your normal soap after using and rinsing off  the CHG Soap.                9.  Pat yourself dry with a clean towel.            10.  Wear clean pajamas.            11.  Place clean sheets on your bed the night of your first shower and do not  sleep with pets. Day of Surgery : Do not apply any lotions/deodorants the morning of surgery.  Please wear clean clothes to the hospital/surgery center.  FAILURE TO FOLLOW THESE INSTRUCTIONS MAY RESULT IN THE CANCELLATION OF YOUR SURGERY PATIENT SIGNATURE_________________________________  NURSE SIGNATURE__________________________________  ________________________________________________________________________ [facility] - Preparing for Surgery Before surgery, you can play an important role.  Because skin is not sterile, your skin needs to be as free of germs as possible.  You can reduce the number of germs on your skin by washing with CHG (chlorahexidine gluconate) soap before surgery.  CHG is an antiseptic cleaner which kills germs and bonds with the skin to continue killing germs even after washing. Please DO NOT use if you have an allergy to CHG or antibacterial soaps.  If your skin becomes reddened/irritated stop using the CHG and inform your nurse when you arrive at Short Stay. Do not shave (including legs and underarms)  for at least 48 hours prior to the first CHG shower.  You may shave your face/neck. Please follow these instructions carefully:  1.  Shower with CHG Soap the night before surgery and the  morning of Surgery.  2.  If you choose to wash your hair, wash your hair first as usual with your  normal  shampoo.  3.  After you shampoo, rinse your hair and body thoroughly to remove the  shampoo.                           4.  Use CHG as you would any other liquid soap.  You can apply chg directly  to the skin and wash                       Gently with a scrungie or clean washcloth.  5.  Apply the CHG Soap to your body ONLY FROM THE NECK DOWN.   Do not use on face/ open                           Wound or open sores. Avoid contact with eyes, ears mouth and genitals (private parts).                       Wash face,  Genitals (private parts) with your normal soap.             6.  Wash thoroughly, paying special attention to the area where your surgery  will be performed.  7.  Thoroughly rinse your body with warm water from the neck down.  8.  DO NOT shower/wash with your normal soap after using and rinsing off  the CHG Soap.                9.  Pat yourself dry with a clean towel.            10.  Wear clean pajamas.            11.  Place clean sheets on your bed the night of your first shower and do not  sleep with pets. Day of Surgery : Do not apply any lotions/deodorants the morning of surgery.  Please wear clean clothes to the hospital/surgery center.  FAILURE TO FOLLOW THESE INSTRUCTIONS MAY RESULT IN THE CANCELLATION OF YOUR SURGERY PATIENT SIGNATURE_________________________________  NURSE SIGNATURE__________________________________  ________________________________________________________________________

## 2017-09-19 ENCOUNTER — Ambulatory Visit: Payer: Medicare Other

## 2017-09-19 ENCOUNTER — Other Ambulatory Visit: Payer: Medicare Other

## 2017-09-22 ENCOUNTER — Encounter (HOSPITAL_COMMUNITY)
Admission: RE | Admit: 2017-09-22 | Discharge: 2017-09-22 | Disposition: A | Discharge status: Home / Self Care | Payer: Medicare Other | Source: Ambulatory Visit | Attending: Urology | Admitting: Urology

## 2017-09-22 ENCOUNTER — Encounter (HOSPITAL_COMMUNITY): Payer: Self-pay

## 2017-09-22 ENCOUNTER — Other Ambulatory Visit: Payer: Self-pay

## 2017-09-22 DIAGNOSIS — Z01812 Encounter for preprocedural laboratory examination: Secondary | ICD-10-CM | POA: Insufficient documentation

## 2017-09-22 DIAGNOSIS — C669 Malignant neoplasm of unspecified ureter: Secondary | ICD-10-CM | POA: Insufficient documentation

## 2017-09-22 HISTORY — DX: Unspecified glaucoma: H40.9

## 2017-09-22 HISTORY — DX: Unspecified hearing loss, unspecified ear: H91.90

## 2017-09-22 LAB — BASIC METABOLIC PANEL
ANION GAP: 6 (ref 5–15)
BUN: 23 mg/dL (ref 8–23)
CHLORIDE: 113 mmol/L — AB (ref 98–111)
CO2: 24 mmol/L (ref 22–32)
CREATININE: 1.68 mg/dL — AB (ref 0.61–1.24)
Calcium: 9.3 mg/dL (ref 8.9–10.3)
GFR calc non Af Amer: 36 mL/min — ABNORMAL LOW (ref >60)
GFR, EST AFRICAN AMERICAN: 42 mL/min — AB (ref >60)
Glucose, Bld: 88 mg/dL (ref 70–99)
POTASSIUM: 4.4 mmol/L (ref 3.5–5.1)
Sodium: 143 mmol/L (ref 135–145)

## 2017-09-22 LAB — CBC
HEMATOCRIT: 29.7 % — AB (ref 39.0–52.0)
Hemoglobin: 9.7 g/dL — ABNORMAL LOW (ref 13.0–17.0)
MCH: 32.7 pg (ref 26.0–34.0)
MCHC: 32.7 g/dL (ref 30.0–36.0)
MCV: 100 fL (ref 78.0–100.0)
Platelets: 304 10*3/uL (ref 150–400)
RBC: 2.97 MIL/uL — ABNORMAL LOW (ref 4.22–5.81)
RDW: 15 % (ref 11.5–15.5)
WBC: 9.3 10*3/uL (ref 4.0–10.5)

## 2017-09-22 NOTE — Progress Notes (Addendum)
Cbc and bmet results routed to dr Tresa Moore at [facility] urology medical record by epic fax

## 2017-09-24 ENCOUNTER — Inpatient Hospital Stay (HOSPITAL_COMMUNITY): Payer: Medicare Other | Admitting: Certified Registered Nurse Anesthetist

## 2017-09-24 ENCOUNTER — Inpatient Hospital Stay (HOSPITAL_COMMUNITY)
Admission: RE | Admit: 2017-09-24 | Discharge: 2017-09-28 | DRG: 657 | Disposition: A | Discharge status: Home / Self Care | Payer: Medicare Other | Source: Ambulatory Visit | Attending: Urology | Admitting: Urology

## 2017-09-24 ENCOUNTER — Encounter (HOSPITAL_COMMUNITY)
Admission: RE | Disposition: A | Discharge status: Home / Self Care | Payer: Self-pay | Source: Ambulatory Visit | Attending: Urology

## 2017-09-24 ENCOUNTER — Other Ambulatory Visit: Payer: Self-pay

## 2017-09-24 ENCOUNTER — Encounter (HOSPITAL_COMMUNITY): Payer: Self-pay | Admitting: *Deleted

## 2017-09-24 DIAGNOSIS — C662 Malignant neoplasm of left ureter: Principal | ICD-10-CM | POA: Diagnosis present

## 2017-09-24 DIAGNOSIS — Z9079 Acquired absence of other genital organ(s): Secondary | ICD-10-CM | POA: Diagnosis not present

## 2017-09-24 DIAGNOSIS — N400 Enlarged prostate without lower urinary tract symptoms: Secondary | ICD-10-CM | POA: Diagnosis present

## 2017-09-24 DIAGNOSIS — H9190 Unspecified hearing loss, unspecified ear: Secondary | ICD-10-CM | POA: Diagnosis present

## 2017-09-24 DIAGNOSIS — F419 Anxiety disorder, unspecified: Secondary | ICD-10-CM | POA: Diagnosis present

## 2017-09-24 DIAGNOSIS — H409 Unspecified glaucoma: Secondary | ICD-10-CM | POA: Diagnosis present

## 2017-09-24 DIAGNOSIS — D649 Anemia, unspecified: Secondary | ICD-10-CM | POA: Diagnosis present

## 2017-09-24 DIAGNOSIS — N183 Chronic kidney disease, stage 3 (moderate): Secondary | ICD-10-CM | POA: Diagnosis present

## 2017-09-24 DIAGNOSIS — F329 Major depressive disorder, single episode, unspecified: Secondary | ICD-10-CM | POA: Diagnosis present

## 2017-09-24 DIAGNOSIS — Z9049 Acquired absence of other specified parts of digestive tract: Secondary | ICD-10-CM

## 2017-09-24 DIAGNOSIS — C689 Malignant neoplasm of urinary organ, unspecified: Secondary | ICD-10-CM | POA: Diagnosis present

## 2017-09-24 DIAGNOSIS — E785 Hyperlipidemia, unspecified: Secondary | ICD-10-CM | POA: Diagnosis present

## 2017-09-24 DIAGNOSIS — N029 Recurrent and persistent hematuria with unspecified morphologic changes: Secondary | ICD-10-CM | POA: Diagnosis present

## 2017-09-24 DIAGNOSIS — Z85038 Personal history of other malignant neoplasm of large intestine: Secondary | ICD-10-CM | POA: Diagnosis not present

## 2017-09-24 DIAGNOSIS — Z79899 Other long term (current) drug therapy: Secondary | ICD-10-CM

## 2017-09-24 DIAGNOSIS — C8333 Diffuse large B-cell lymphoma, intra-abdominal lymph nodes: Secondary | ICD-10-CM | POA: Diagnosis present

## 2017-09-24 DIAGNOSIS — K660 Peritoneal adhesions (postprocedural) (postinfection): Secondary | ICD-10-CM | POA: Diagnosis present

## 2017-09-24 HISTORY — PX: ROBOT ASSITED LAPAROSCOPIC NEPHROURETERECTOMY: SHX6077

## 2017-09-24 HISTORY — PX: ROBOTIC ASSISTED LAPAROSCOPIC LYSIS OF ADHESION: SHX6080

## 2017-09-24 LAB — HEMOGLOBIN AND HEMATOCRIT, BLOOD
HEMATOCRIT: 28.2 % — AB (ref 39.0–52.0)
Hemoglobin: 9.1 g/dL — ABNORMAL LOW (ref 13.0–17.0)

## 2017-09-24 LAB — TYPE AND SCREEN
ABO/RH(D): O POS
Antibody Screen: NEGATIVE

## 2017-09-24 SURGERY — NEPHROURETERECTOMY, ROBOT-ASSISTED, LAPAROSCOPIC
Anesthesia: General | Laterality: Left

## 2017-09-24 MED ORDER — SENNOSIDES-DOCUSATE SODIUM 8.6-50 MG PO TABS
2.0000 | ORAL_TABLET | Freq: Every day | ORAL | Status: DC
  Administered 2017-09-25: 2 via ORAL
  Filled 2017-09-24 (×2): qty 2

## 2017-09-24 MED ORDER — EPHEDRINE 5 MG/ML INJ
INTRAVENOUS | Status: AC
  Filled 2017-09-24: qty 10

## 2017-09-24 MED ORDER — DEXTROSE-NACL 5-0.45 % IV SOLN
INTRAVENOUS | Status: DC
Start: 2017-09-24 — End: 2017-09-28
  Administered 2017-09-24 – 2017-09-27 (×6): via INTRAVENOUS

## 2017-09-24 MED ORDER — PROPOFOL 10 MG/ML IV BOLUS
INTRAVENOUS | Status: AC
  Filled 2017-09-24: qty 20

## 2017-09-24 MED ORDER — LIDOCAINE 2% (20 MG/ML) 5 ML SYRINGE
INTRAMUSCULAR | Status: AC
Start: 2017-09-24 — End: ?
  Filled 2017-09-24: qty 5

## 2017-09-24 MED ORDER — SERTRALINE HCL 25 MG PO TABS
25.0000 mg | ORAL_TABLET | Freq: Every morning | ORAL | Status: DC
Start: 2017-09-25 — End: 2017-09-28
  Administered 2017-09-25 – 2017-09-28 (×4): 25 mg via ORAL
  Filled 2017-09-24 (×4): qty 1

## 2017-09-24 MED ORDER — FENTANYL CITRATE (PF) 250 MCG/5ML IJ SOLN
INTRAMUSCULAR | Status: AC
  Filled 2017-09-24: qty 5

## 2017-09-24 MED ORDER — ONDANSETRON HCL 4 MG/2ML IJ SOLN: 4.0000 mg | Freq: Once | INTRAMUSCULAR | Status: DC | PRN

## 2017-09-24 MED ORDER — ROCURONIUM BROMIDE 10 MG/ML (PF) SYRINGE
PREFILLED_SYRINGE | INTRAVENOUS | Status: AC
  Filled 2017-09-24: qty 10

## 2017-09-24 MED ORDER — ACETAMINOPHEN 10 MG/ML IV SOLN
INTRAVENOUS | Status: AC
  Filled 2017-09-24: qty 100

## 2017-09-24 MED ORDER — STERILE WATER FOR IRRIGATION IR SOLN
Status: DC | PRN
  Administered 2017-09-24: 1000 mL

## 2017-09-24 MED ORDER — SUCCINYLCHOLINE CHLORIDE 200 MG/10ML IV SOSY
PREFILLED_SYRINGE | INTRAVENOUS | Status: AC
  Filled 2017-09-24: qty 10

## 2017-09-24 MED ORDER — CEFAZOLIN SODIUM-DEXTROSE 2-4 GM/100ML-% IV SOLN
2.0000 g | INTRAVENOUS | Status: AC
  Administered 2017-09-24: 2 g via INTRAVENOUS
  Filled 2017-09-24: qty 100

## 2017-09-24 MED ORDER — SUCCINYLCHOLINE CHLORIDE 200 MG/10ML IV SOSY
PREFILLED_SYRINGE | INTRAVENOUS | Status: DC | PRN
  Administered 2017-09-24: 100 mg via INTRAVENOUS

## 2017-09-24 MED ORDER — LATANOPROST 0.005 % OP SOLN
1.0000 [drp] | Freq: Every day | OPHTHALMIC | Status: DC
  Filled 2017-09-24: qty 2.5

## 2017-09-24 MED ORDER — FENTANYL CITRATE (PF) 100 MCG/2ML IJ SOLN
INTRAMUSCULAR | Status: AC
  Administered 2017-09-24: 50 ug via INTRAVENOUS
  Filled 2017-09-24: qty 2

## 2017-09-24 MED ORDER — SODIUM CHLORIDE 0.9 % IJ SOLN
INTRAMUSCULAR | Status: AC
  Filled 2017-09-24: qty 20

## 2017-09-24 MED ORDER — MAGNESIUM CITRATE PO SOLN
1.0000 | Freq: Once | ORAL | Status: DC
  Administered 2017-09-24: 1 via ORAL

## 2017-09-24 MED ORDER — PRAVASTATIN SODIUM 40 MG PO TABS
40.0000 mg | ORAL_TABLET | Freq: Every day | ORAL | Status: DC
  Administered 2017-09-24 – 2017-09-27 (×4): 40 mg via ORAL
  Filled 2017-09-24 (×4): qty 1

## 2017-09-24 MED ORDER — ROCURONIUM BROMIDE 50 MG/5ML IV SOSY
PREFILLED_SYRINGE | INTRAVENOUS | Status: DC | PRN
  Administered 2017-09-24: 60 mg via INTRAVENOUS
  Administered 2017-09-24: 10 mg via INTRAVENOUS
  Administered 2017-09-24 (×2): 20 mg via INTRAVENOUS

## 2017-09-24 MED ORDER — FENTANYL CITRATE (PF) 100 MCG/2ML IJ SOLN
25.0000 ug | INTRAMUSCULAR | Status: DC | PRN
  Administered 2017-09-24: 25 ug via INTRAVENOUS
  Administered 2017-09-24: 50 ug via INTRAVENOUS
  Administered 2017-09-24 (×3): 25 ug via INTRAVENOUS

## 2017-09-24 MED ORDER — CEFAZOLIN SODIUM-DEXTROSE 1-4 GM/50ML-% IV SOLN
1.0000 g | Freq: Three times a day (TID) | INTRAVENOUS | Status: AC
  Administered 2017-09-24 – 2017-09-25 (×2): 1 g via INTRAVENOUS
  Filled 2017-09-24 (×2): qty 50

## 2017-09-24 MED ORDER — BUPIVACAINE LIPOSOME 1.3 % IJ SUSP
20.0000 mL | Freq: Once | INTRAMUSCULAR | Status: AC
  Administered 2017-09-24: 20 mL
  Filled 2017-09-24: qty 20

## 2017-09-24 MED ORDER — LIDOCAINE 2% (20 MG/ML) 5 ML SYRINGE
INTRAMUSCULAR | Status: DC | PRN
  Administered 2017-09-24: 100 mg via INTRAVENOUS

## 2017-09-24 MED ORDER — DOCUSATE SODIUM 100 MG PO CAPS
100.0000 mg | ORAL_CAPSULE | Freq: Two times a day (BID) | ORAL | Status: DC
  Administered 2017-09-25: 100 mg via ORAL
  Filled 2017-09-24 (×3): qty 1

## 2017-09-24 MED ORDER — DEXAMETHASONE SODIUM PHOSPHATE 10 MG/ML IJ SOLN
INTRAMUSCULAR | Status: AC
  Filled 2017-09-24: qty 1

## 2017-09-24 MED ORDER — FLUTICASONE PROPIONATE 50 MCG/ACT NA SUSP
1.0000 | Freq: Every day | NASAL | Status: DC | PRN
Start: 2017-09-24 — End: 2017-09-28

## 2017-09-24 MED ORDER — ONDANSETRON HCL 4 MG/2ML IJ SOLN
INTRAMUSCULAR | Status: AC
  Filled 2017-09-24: qty 2

## 2017-09-24 MED ORDER — DEXAMETHASONE SODIUM PHOSPHATE 10 MG/ML IJ SOLN
INTRAMUSCULAR | Status: DC | PRN
  Administered 2017-09-24: 10 mg via INTRAVENOUS

## 2017-09-24 MED ORDER — SUGAMMADEX SODIUM 200 MG/2ML IV SOLN
INTRAVENOUS | Status: DC | PRN
  Administered 2017-09-24: 200 mg via INTRAVENOUS

## 2017-09-24 MED ORDER — PROPOFOL 10 MG/ML IV BOLUS
INTRAVENOUS | Status: DC | PRN
  Administered 2017-09-24: 150 mg via INTRAVENOUS

## 2017-09-24 MED ORDER — LACTATED RINGERS IR SOLN
Status: DC | PRN
  Administered 2017-09-24: 1000 mL

## 2017-09-24 MED ORDER — OXYCODONE HCL 5 MG PO TABS
5.0000 mg | ORAL_TABLET | ORAL | Status: DC | PRN
  Administered 2017-09-25 – 2017-09-26 (×6): 5 mg via ORAL
  Filled 2017-09-24 (×7): qty 1

## 2017-09-24 MED ORDER — ONDANSETRON HCL 4 MG/2ML IJ SOLN
4.0000 mg | INTRAMUSCULAR | Status: DC | PRN
  Administered 2017-09-27: 4 mg via INTRAVENOUS
  Filled 2017-09-24: qty 2

## 2017-09-24 MED ORDER — ACETAMINOPHEN 500 MG PO TABS
1000.0000 mg | ORAL_TABLET | Freq: Four times a day (QID) | ORAL | Status: AC
  Administered 2017-09-25 (×3): 1000 mg via ORAL
  Filled 2017-09-24 (×3): qty 2

## 2017-09-24 MED ORDER — FENTANYL CITRATE (PF) 100 MCG/2ML IJ SOLN
INTRAMUSCULAR | Status: AC
  Administered 2017-09-24: 25 ug via INTRAVENOUS
  Filled 2017-09-24: qty 2

## 2017-09-24 MED ORDER — LACTATED RINGERS IV SOLN
INTRAVENOUS | Status: DC
  Administered 2017-09-24 (×3): via INTRAVENOUS

## 2017-09-24 MED ORDER — EPHEDRINE SULFATE-NACL 50-0.9 MG/10ML-% IV SOSY
PREFILLED_SYRINGE | INTRAVENOUS | Status: DC | PRN
  Administered 2017-09-24: 10 mg via INTRAVENOUS
  Administered 2017-09-24: 5 mg via INTRAVENOUS
  Administered 2017-09-24: 10 mg via INTRAVENOUS
  Administered 2017-09-24: 5 mg via INTRAVENOUS

## 2017-09-24 MED ORDER — SODIUM CHLORIDE 0.9 % IJ SOLN
INTRAMUSCULAR | Status: DC | PRN
  Administered 2017-09-24: 20 mL

## 2017-09-24 MED ORDER — FENTANYL CITRATE (PF) 100 MCG/2ML IJ SOLN
INTRAMUSCULAR | Status: DC | PRN
  Administered 2017-09-24 (×5): 50 ug via INTRAVENOUS

## 2017-09-24 MED ORDER — SUGAMMADEX SODIUM 200 MG/2ML IV SOLN
INTRAVENOUS | Status: AC
  Filled 2017-09-24: qty 2

## 2017-09-24 MED ORDER — ACETAMINOPHEN 10 MG/ML IV SOLN
1000.0000 mg | Freq: Once | INTRAVENOUS | Status: AC
  Administered 2017-09-24: 1000 mg via INTRAVENOUS

## 2017-09-24 MED ORDER — ONDANSETRON HCL 4 MG/2ML IJ SOLN
INTRAMUSCULAR | Status: DC | PRN
  Administered 2017-09-24: 4 mg via INTRAVENOUS

## 2017-09-24 MED ORDER — HYDROMORPHONE HCL 1 MG/ML IJ SOLN
0.5000 mg | INTRAMUSCULAR | Status: DC | PRN
  Administered 2017-09-24: 1 mg via INTRAVENOUS
  Filled 2017-09-24: qty 1

## 2017-09-24 MED ORDER — ACETAMINOPHEN 500 MG PO TABS
1000.0000 mg | ORAL_TABLET | Freq: Once | ORAL | Status: DC
  Filled 2017-09-24: qty 2

## 2017-09-24 SURGICAL SUPPLY — 60 items
BAG LAPAROSCOPIC 12 15 PORT 16 (BASKET) ×2 IMPLANT
BAG RETRIEVAL 12/15 (BASKET) ×3
BAG RETRIEVAL 12/15MM (BASKET) ×1
CHLORAPREP W/TINT 26ML (MISCELLANEOUS) ×4 IMPLANT
CLIP VESOLOCK LG 6/CT PURPLE (CLIP) ×4 IMPLANT
CLIP VESOLOCK MED LG 6/CT (CLIP) ×4 IMPLANT
COVER SURGICAL LIGHT HANDLE (MISCELLANEOUS) ×4 IMPLANT
COVER TIP SHEARS 8 DVNC (MISCELLANEOUS) ×2 IMPLANT
COVER TIP SHEARS 8MM DA VINCI (MISCELLANEOUS) ×2
CUTTER ECHEON FLEX ENDO 45 340 (ENDOMECHANICALS) ×4 IMPLANT
DECANTER SPIKE VIAL GLASS SM (MISCELLANEOUS) ×4 IMPLANT
DERMABOND ADVANCED (GAUZE/BANDAGES/DRESSINGS) ×4
DERMABOND ADVANCED .7 DNX12 (GAUZE/BANDAGES/DRESSINGS) ×4 IMPLANT
DRAIN CHANNEL 15F RND FF 3/16 (WOUND CARE) ×4 IMPLANT
DRAPE ARM DVNC X/XI (DISPOSABLE) ×8 IMPLANT
DRAPE COLUMN DVNC XI (DISPOSABLE) ×2 IMPLANT
DRAPE DA VINCI XI ARM (DISPOSABLE) ×8
DRAPE DA VINCI XI COLUMN (DISPOSABLE) ×2
DRAPE INCISE 23X17 IOBAN STRL (DRAPES) ×2
DRAPE INCISE IOBAN 23X17 STRL (DRAPES) ×2 IMPLANT
DRAPE INCISE IOBAN 66X45 STRL (DRAPES) ×4 IMPLANT
DRAPE LAPAROSCOPIC ABDOMINAL (DRAPES) ×4 IMPLANT
DRAPE SHEET LG 3/4 BI-LAMINATE (DRAPES) ×4 IMPLANT
DRSG TEGADERM 4X4.75 (GAUZE/BANDAGES/DRESSINGS) ×4 IMPLANT
ELECT PENCIL ROCKER SW 15FT (MISCELLANEOUS) ×4 IMPLANT
ELECT REM PT RETURN 15FT ADLT (MISCELLANEOUS) ×4 IMPLANT
EVACUATOR SILICONE 100CC (DRAIN) ×4 IMPLANT
GLOVE BIOGEL M STRL SZ7.5 (GLOVE) ×8 IMPLANT
GOWN STRL REUS W/TWL LRG LVL3 (GOWN DISPOSABLE) ×12 IMPLANT
IRRIG SUCT STRYKERFLOW 2 WTIP (MISCELLANEOUS) ×4
IRRIGATION SUCT STRKRFLW 2 WTP (MISCELLANEOUS) ×2 IMPLANT
KIT BASIN OR (CUSTOM PROCEDURE TRAY) ×4 IMPLANT
MARKER SKIN DUAL TIP RULER LAB (MISCELLANEOUS) ×4 IMPLANT
NEEDLE INSUFFLATION 14GA 120MM (NEEDLE) ×4 IMPLANT
PORT ACCESS TROCAR AIRSEAL 12 (TROCAR) ×2 IMPLANT
PORT ACCESS TROCAR AIRSEAL 5M (TROCAR) ×2
POSITIONER SURGICAL ARM (MISCELLANEOUS) ×8 IMPLANT
SCISSORS LAP 5X45 EPIX DISP (ENDOMECHANICALS) ×4 IMPLANT
SEAL CANN UNIV 5-8 DVNC XI (MISCELLANEOUS) ×8 IMPLANT
SEAL XI 5MM-8MM UNIVERSAL (MISCELLANEOUS) ×8
SET TRI-LUMEN FLTR TB AIRSEAL (TUBING) ×4 IMPLANT
SOLUTION ELECTROLUBE (MISCELLANEOUS) ×4 IMPLANT
STAPLE RELOAD 45 WHT (STAPLE) ×12 IMPLANT
STAPLE RELOAD 45MM WHITE (STAPLE) ×12
SUT ETHILON 3 0 PS 1 (SUTURE) ×4 IMPLANT
SUT MNCRL AB 4-0 PS2 18 (SUTURE) ×8 IMPLANT
SUT PDS AB 1 CT1 27 (SUTURE) ×12 IMPLANT
SUT VIC AB 0 CT1 27 (SUTURE) ×2
SUT VIC AB 0 CT1 27XBRD ANTBC (SUTURE) ×2 IMPLANT
SUT VIC AB 2-0 SH 27 (SUTURE) ×4
SUT VIC AB 2-0 SH 27X BRD (SUTURE) ×4 IMPLANT
SUT VLOC BARB 180 ABS3/0GR12 (SUTURE) ×4
SUTURE VLOC BRB 180 ABS3/0GR12 (SUTURE) ×2 IMPLANT
TOWEL OR 17X26 10 PK STRL BLUE (TOWEL DISPOSABLE) ×4 IMPLANT
TOWEL OR NON WOVEN STRL DISP B (DISPOSABLE) ×4 IMPLANT
TRAY FOLEY MTR SLVR 16FR STAT (SET/KITS/TRAYS/PACK) ×4 IMPLANT
TRAY LAPAROSCOPIC (CUSTOM PROCEDURE TRAY) ×4 IMPLANT
TROCAR BLADELESS OPT 5 100 (ENDOMECHANICALS) ×4 IMPLANT
TROCAR UNIVERSAL OPT 12M 100M (ENDOMECHANICALS) ×4 IMPLANT
TROCAR XCEL 12X100 BLDLESS (ENDOMECHANICALS) ×4 IMPLANT

## 2017-09-24 NOTE — Progress Notes (Signed)
Post-op note  Subjective: The patient is doing well.  No complaints.  Objective: Vital signs in last 24 hours: Temp:  [97.5 F (36.4 C)-98.5 F (36.9 C)] 97.6 F (36.4 C) (07/31 1728) Pulse Rate:  [69-86] 79 (07/31 1728) Resp:  [13-18] 16 (07/31 1728) BP: (129-148)/(69-86) 148/86 (07/31 1728) SpO2:  [99 %-100 %] 100 % (07/31 1728) Weight:  [86.6 kg (191 lb)] 86.6 kg (191 lb) (07/31 1019)  Intake/Output from previous day: No intake/output data recorded. Intake/Output this shift: No intake/output data recorded.  Physical Exam:  General: Alert and oriented. Abdomen: Soft, Nondistended, JP with thin s/s output Incisions: Clean and dry. GU: foley catheter to drainage, draining thin watermelon color urine  Lab Results: Recent Labs    09/22/17 1055 09/24/17 1628  HGB 9.7* 9.1*  HCT 29.7* 28.2*    Assessment/Plan: POD#0 s/p robotic left nephroureterectomy.  1) Continue to monitor 2) [facility] Tylenol, PRN oxycodone, dilaudid 3) CLD 4) mIVFs 5) Continue Foley catheter to drainage   Case Rob Bunting, MD   LOS: 0 days   Case Rob Bunting 09/24/2017, 7:18 PM

## 2017-09-24 NOTE — Op Note (Signed)
NAME: Jonathan Marshall, KUE MEDICAL RECORD IO:[id number] ACCOUNT [id number] DATE OF BIRTH:Aug 21, 1932 FACILITY: [facility] LOCATION: [facility] PHYSICIAN:Kyian Obst, MD  OPERATIVE REPORT  DATE OF PROCEDURE:  09/24/2017  PREOPERATIVE DIAGNOSIS:  Large volume, left ureteral cancer.  PROCEDURE PERFORMED:   1.  Left nephroureterectomy. 2.  Extensive laparoscopic adhesiolysis.  ESTIMATED BLOOD LOSS:  100 mL.  COMPLICATIONS:  None.  RISK SPECIMENS:  Left kidney with ureter also stents in situ.  DRAINS: 1.  Jackson-Pratt drain to bulb suction. 2.  Straight drain.  SURGEON:  Alexis Frock, MD  ASSISTANT:  Case Clydene Laming, MD  FINDINGS: 1.  A 2-artery, 1-vein left renal vascular anatomy was anticipated. 2.  Significant intra-abdominal adhesions consistent with known prior surgery.  This required approximately 45 minutes dedicated adhesiolysis. 3.  Some desmoplastic reaction around the ureter and hilar area consistent with known urothelial cancer as well as recent lymphoma.  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  He underwent endoscopic exam which corroborated this finding and stenting several months ago.  On staging, he unfortunately was found to have a very  large retroperitoneal mass.  Biopsy confirmed actually primary being a lymphoma.  He received chemotherapy for this with a very good response such that it was felt safe to proceed with his left nephroureterectomy as previously planned repeat staging  imaging revealed no evidence of locally advanced or distant disease.  Informed consent was obtained and placed in medical record.  PROCEDURE PERFORMED:  The patient being Sabir Charters,  verified left nephroureterectomy procedure was confirmed.  Procedure timeout was performed.  Intravenous antibiotics administered.  General endotracheal anesthesia introduced.  A Foley catheter was  placed per urethra  straight drain.  The patient was placed into the left side up, full flank position, pulling 15 degrees of table flexion and superior elevator axillary roll, sequential compression devices, bottom leg bent, top leg straight.  He was further fastened to the operating table  using 3-inch tape over foam padding across the supraxiphoid chest and his pelvis.  A sterile field was created by first clipping, shaving, then prepping and draping the patient's entire left flank and abdomen using chlorhexidine gluconate and a  high-flow, low-pressure pneumoperitoneum was obtained using Veress technique in the left lower quadrant, having passed the aspiration and drop test.  Next, an 8 mm robotic camera port was placed into position approximately 1 handbreadth superolateral to  the umbilicus.  Laparoscopic examination of the peritoneal cavity revealed multifocal significant adhesions including some small bowel to the anterior abdominal wall, small bowel, small bowel adhesions and large volume omental to anterior abdominal wall  adhesions.  At this point, additional ports were placed including inferior paramedian 8 mm robotic port approximately 1 handbreadth superior to the pubic ramus, left far lateral 8 mm robotic port 3 fingerbreadths superior and medial to the anterior  superior iliac spine and a 5 mm port approximately 1 handbreadth superior through to the lateral most port.  Next, approximately 45 minutes was spent performing very extensive careful delivered adhesiolysis of multiple loops of small bowel to the  anterior abdominal wall towards the midline.  Several small bowel interloop adhesions, a significant amount of omental to anterior abdominal wall adhesions was performed just enough to allow  placement of one  medial robotic assistant ports.  Exquisite  care was taken to avoid visceral injury or devascularization of the bowel mesentery, which did not occur grossly.  After appropriate adhesiolysis, a left subcostal 8 mm robotic port was placed and two 12 mm assistant port site in the midline, one 2  fingerbreadths above the camera port and another in the supraumbilical crease.  Robot was docked and passed electronic checks.  Attention was directed at the level of the retroperitoneum.  Incision was made lateral to the prior mesentery of the  descending colon from the descending colon was no longer at this location given prior colon surgery.  The lower pole of the kidney area was identified, placed on gentle lateral traction.  Dissection proceeded medial to this.  The gonadal vessels and  ureter were encountered and placed on gentle lateral traction.  Dissection procedures superior with this and the triangle of the gonadal vessels, ureter and psoas musculature.  As anticipated, there was a dominant lower pole left renal artery.  This was  controlled using vascular clip proximally, vascular stapler distally and at any area superior to this.  A smaller superior artery, and a single renal vein were encountered.  The artery was controlled using vascular clips proximal, vascular stapler distal  and the vein controlled using vascular stapler load.  Proximal to the area of the adrenal vessels and gonadal vessels, there was some significant desmoplastic reaction in the area of the right renal hilum consistent with known recent lymphoma in a  similar location.  A plane was chosen just lateral to the aorta to dissect the adrenal gland with the kidney specimen and medial renal attachments were taken down using a vascular stapler.  Superior and lateral attachments were then taken down using  cautery scissors and completely freed up the kidney area.  The ureter was then very carefully circumferentially mobilized  distally towards the area of the iliac vessels.  An incision was made purposely lateral to the left medial umbilical ligament along  the retroperitoneum of the pelvis to be further developed.  Very careful circumferential dissection was performed distal to the iliac vessels of the left ureter towards the area of the deep pelvis.  In the area of the gonadal vessels, there was  significant desmoplastic reaction around the ureter consistent with known urothelial neoplasm at this site.  There was no obvious locally advanced disease or very large lymphadenopathy.  At a distance of approximately 2 cm below the iliac vessels, the  ureteral caliber and desmoplastic reaction became much more normal towards the area of the ureterovesical junction.  At this point, a single 3-0 V-Loc suture was placed medial to the ureterovesical junction, approximately 1 cm using the stay stitch and a  pluck technique was used for the distalmost ureteral dissection after an extra-large clip was placed across it to prevent antegrade spillage of any tumor contents.  The very small bladder cuff was then oversewn using the previous V-Loc suture x3.  The  in situ stent was inspected and intact distally.  The specimen was then placed in an EndoCatch bag for later retrieval.  All sponge, needle counts were correct.  Hemostasis appeared excellent.  A closed suction drain was brought to the previous superior  most robotic port site near the peritoneal cavity.  Given significant adhesions in the area of the prior midline,  it was felt that the safest way to remove the specimen would be through a modified small Gibson-type incision for extraction and incision  approximately 6 cm in length was made in diagonal fashion from the area of the previous lateral most robotic port site inferomedially.  The specimen was retrieved and set aside for permanent pathology.  Next, the extraction site was closed at the level  of the internal fascia and peritoneum  using a running Vicryl at the level of the external fascia using interrupted PDS x5.  The level of Scarpa's using running Vicryl.  All incision sites were infiltrated with dilute lipolyzed Marcaine and closed with  skin using subcuticular Monocryl by Dermabond.  The procedure terminated.  The patient tolerated the procedure well.  No immediate apparent complications.  The patient taken to [facility]  in stable condition.  Please note first assistant, Cast Wood, MD was actually crucial for all robotic portions and laparoscopic portion of the procedure today.  Provided invaluable camera manipulation, vascular clipping, vascular stapling, specimen manipulation, retraction,  suctioning.  AN/NUANCE  D:09/24/2017 T:09/24/2017 JOB:[id number]

## 2017-09-24 NOTE — H&P (Signed)
Jonathan Marshall is an 84 y.o. male.    Chief Complaint: Pre-op LEFT Nephroureterectomy  HPI:   1 - Left Ureteral Cancer - approx 2cm left distal papillary tumor by ureteroscopy / [place] 03/2017 by [facility]t on eval recurrent hematuria. Path non-diagnostic but clearly urothelial by endoscopic appearance. Re-BX 04/2017 confirms urothelial carcinoma (high grade), 6x26 contour stent left I nplace. Staging CT with LARGE RETROPERITONEAL NODE MASS 7cm. CT BX performed 04/2017 path shows lymphoma. 2 artery (upper below main vein, also much lower accessory artery) / 1 vein left renovascular anatomy.   2 - Very Low Risk Prostate Cancer - <5% Gleason 6 disease incidental in TURP chips 08/2016 ==> surveillance.   3 - Prostatic Hypertrophy with Lower Urinary Tract Symptoms - s/p TURP 08/2016 by Dahlstedt. On finasteride post-op to prevent regrowth.   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 Undergoing primary chemo under care pf Carolyne Fiscal MD with [facility]. Restaging PET.CT 07/2017 with good response to chemo.   5 - Stage 3 Renal Insufficiency-  Cr 1.5-1.8's at baseline during treatment for B cell lymphoma with both native kidneys in situ.   PMH sig for colon cancer (s/p primary resection / re-anastamosis (never had colosomy) + chemo 1993, follows GI [facility] now), Rt inguinal hernia repair. His PCP is Lorene Dy MD   Today " Jonathan Marshall " is seen to proceed with LEFT nephroureterectomy for high grade ureteral cancer. He has ongoing hematuria.    Past Medical History:  Diagnosis Date  . BPH (benign prostatic hyperplasia)   . Cancer [facility]) 1993   Colon surgery and chemo , 2-19 left ureteral camcer last chemo tx 08-27-17  . DDD (degenerative disc disease), lumbar    L4-L5  . Depression   . Early stage glaucoma    both eyes  . Hearing loss   . Hematuria, gross   . History of adenomatous polyp of colon   . History of carpal tunnel syndrome    left   . History of colon cancer    dx 1993  s/p  hemicolectomy (malignant tumor x2)  and chemo therapy completed 1993--- no recurrence per pt  . History of shingles   . HOH (hard of hearing)    right ear  . Hyperlipidemia   . Insomnia     Past Surgical History:  Procedure Laterality Date  . APPENDECTOMY    . CARPAL TUNNEL RELEASE Left 08/13/2013   Procedure: CARPAL TUNNEL RELEASE LEFT WRIST;  Surgeon: Yvette Rack., MD;  Location: [facility];  Service: Orthopedics;  Laterality: Left;  . COLONOSCOPY    . CYSTOSCOPY W/ RETROGRADES Bilateral 04/03/2017   Procedure: CYSTOSCOPY WITH RETROGRADE PYELOGRAM, LEFT URETEROSCOPY, LEFT STENT PLACEMENT;  Surgeon: Franchot Gallo, MD;  Location: [facility];  Service: Urology;  Laterality: Bilateral;  . CYSTOSCOPY W/ URETERAL STENT PLACEMENT Left 04/23/2017   Procedure: CYSTOSCOPY WITH STENT REPLACEMENT;  Surgeon: Alexis Frock, MD;  Location: [facility];  Service: Urology;  Laterality: Left;  . CYSTOSCOPY WITH FULGERATION Left 04/03/2017   Procedure: LEFT URETERAL BIOPSY;  Surgeon: Franchot Gallo, MD;  Location: [facility];  Service: Urology;  Laterality: Left;  . CYSTOSCOPY/RETROGRADE/URETEROSCOPY Left 04/23/2017   Procedure: CYSTOSCOPY/RETROGRADE/URETEROSCOPY, LEFT  URETEROSCOPY WITY  BIOPSY;  Surgeon: Alexis Frock, MD;  Location: [facility];  Service: Urology;  Laterality: Left;  . HEMICOLECTOMY  1993   malignant tumor x2 ,  colon cancer and Appendectomy  . INGUINAL HERNIA REPAIR Right 2003  . IR FLUORO GUIDE PORT INSERTION RIGHT  05/30/2017  . IR US GUIDE VASC ACCESS RIGHT  05/30/2017  . MASTOIDECTOMY Right 2015  . SIGMOIDOSCOPY  last one 06/ 2018  . TRANSURETHRAL RESECTION OF PROSTATE N/A 09/02/2016   Procedure: TRANSURETHRAL RESECTION OF THE PROSTATE (TURP);  Surgeon: Franchot Gallo, MD;  Location: [facility];  Service: Urology;  Laterality: N/A;     History reviewed. No pertinent family history. Social History:  reports that he has never smoked. He has never used smokeless tobacco. He reports that he drinks about 0.6 oz of alcohol per week. He reports that he does not use drugs.  Allergies: No Known Allergies  Medications Prior to Admission  Medication Sig Dispense Refill  . cefpodoxime (VANTIN) 200 MG tablet Take 200 mg by mouth 2 (two) times daily. Started Thursday 09-18-17 in pm x 7 days    . clotrimazole (LOTRIMIN) 1 % cream Apply 1 application topically 2 (two) times daily as needed (for foot rash).    . diphenhydramine-acetaminophen (TYLENOL PM) 25-500 MG TABS tablet Take 2 tablets by mouth at bedtime.     . fluticasone (FLONASE) 50 MCG/ACT nasal spray Place 1 spray into both nostrils daily as needed (for allergies during SPRING months ONLY).     [name] Kitchen latanoprost (XALATAN) 0.005 % ophthalmic solution Place 1 drop into both eyes at bedtime.    . lidocaine-prilocaine (EMLA) cream Apply 1 application topically as needed. For use one hour prior to port access. Place quarter-size amount over port and cover with press-n-seal. 30 g 0  . lovastatin (MEVACOR) 40 MG tablet Take 40 mg by mouth every evening.    . sertraline (ZOLOFT) 25 MG tablet Take 25 mg by mouth every morning.     [name] Kitchen LORazepam (ATIVAN) 0.5 MG tablet Take 1 tablet (0.5 mg total) by mouth every 6 (six) hours as needed (Nausea or vomiting). (Patient not taking: Reported on 09/15/2017) 30 tablet 0  . ondansetron (ZOFRAN) 8 MG tablet Take 1 tablet (8 mg total) by mouth 2 (two) times daily as needed for refractory nausea / vomiting. Start on day 3 after cytoxan chemorx (Patient not taking: Reported on 09/15/2017) 30 tablet 1  . predniSONE (DELTASONE) 20 MG tablet Take 3 tablets (60 mg total) by mouth daily. Take on days 1-5 of chemotherapy. (Patient not taking: Reported on 09/15/2017) 15 tablet 6  . prochlorperazine (COMPAZINE) 10 MG tablet Take 1 tablet (10 mg total) by mouth every 6  (six) hours as needed (Nausea or vomiting). (Patient not taking: Reported on 09/15/2017) 30 tablet 6    Results for orders placed or performed during the hospital encounter of 09/24/17 (from the past 48 hour(s))  ABO/Rh     Status: None   Collection Time: 09/24/17 10:22 AM  Result Value Ref Range   ABO/RH(D)      O POS Performed at [facility], [place] [address], [place], [place] [address]   Type and screen [place]     Status: None   Collection Time: 09/24/17 10:23 AM  Result Value Ref Range   ABO/RH(D) O POS    Antibody Screen NEG    Sample Expiration      09/27/2017 Performed at [facility], [place] [address], [place], [place] [address]    No results found.  Review of Systems  Constitutional: Negative.  Negative for chills.  HENT: Negative.  Eyes: Negative.   Respiratory: Negative.   Cardiovascular: Negative.   Gastrointestinal: Negative.   Genitourinary: Positive for hematuria.  Musculoskeletal: Negative.   Skin: Negative.   Neurological: Negative.   Endo/Heme/Allergies: Negative.   Psychiatric/Behavioral: Negative.     Blood pressure 129/85, pulse 86, temperature 97.6 F (36.4 C), temperature source Oral, resp. rate 18, height 5\' 9"  (1.753 m), weight 86.6 kg (191 lb), SpO2 99 %. Physical Exam  Constitutional: He appears well-developed.  Very spry and pleasant, wife at bedside.   HENT:  Head: Normocephalic.  Eyes: Pupils are equal, round, and reactive to light.  Neck: Normal range of motion.  Respiratory: Effort normal.  GI: Soft.  Several scars w/o hernias.   Genitourinary:  Genitourinary Comments: NO CVAT at present.  Musculoskeletal: Normal range of motion.  Neurological: He is alert.  Skin: Skin is warm.  Psychiatric: He has a normal mood and affect.     Assessment/Plan  Proceed as planned with LEFT nephrouretrectomy. Risks, benefits, alternatives, expected peri-op course discsused extensively  previously and reiterated today.   Alexis Frock, MD 09/24/2017, 11:38 AM

## 2017-09-24 NOTE — Anesthesia Procedure Notes (Signed)
Procedure Name: Intubation Date/Time: 09/24/2017 12:06 PM Performed by: Maxwell Caul, CRNA Pre-anesthesia Checklist: Patient identified, Emergency Drugs available, Suction available and Patient being monitored Patient Re-evaluated:Patient Re-evaluated prior to induction Oxygen Delivery Method: Circle system utilized Preoxygenation: Pre-oxygenation with 100% oxygen Induction Type: IV induction Ventilation: Mask ventilation without difficulty Laryngoscope Size: Mac and 4 Grade View: Grade III Tube type: Oral Tube size: 7.5 mm Number of attempts: 1 Airway Equipment and Method: Stylet and Oral airway Placement Confirmation: ETT inserted through vocal cords under direct vision,  positive ETCO2 and breath sounds checked- equal and bilateral Secured at: 21 cm Tube secured with: Tape Dental Injury: Teeth and Oropharynx as per pre-operative assessment

## 2017-09-24 NOTE — Brief Op Note (Signed)
09/24/2017  3:49 PM  PATIENT:  Jonathan Marshall  84 y.o. male  PRE-OPERATIVE DIAGNOSIS:  LEFT URETERAL CANCER  POST-OPERATIVE DIAGNOSIS:  LEFT URETERAL CANCER  PROCEDURE:  Procedure(s): XI ROBOT ASSITED LAPAROSCOPIC NEPHROURETERECTOMY (Left) XI ROBOTIC ASSISTED LAPAROSCOPIC EXTENSIVE LYSIS OF ADHESION  SURGEON:  Surgeon(s) and Role:    * Alexis Frock, MD - Primary  PHYSICIAN ASSISTANT:   ASSISTANTS: Case Wood MD   ANESTHESIA:   local and general  EBL:  100 mL   BLOOD ADMINISTERED:none  DRAINS: 1 - JP to buld, 2 - FOley to gravity   LOCAL MEDICATIONS USED:  MARCAINE     SPECIMEN:  Source of Specimen:  Left Kidney + Ureter  DISPOSITION OF SPECIMEN:  PATHOLOGY  COUNTS:  YES  TOURNIQUET:  * No tourniquets in log *  DICTATION: .Other Dictation: Dictation Number [id number]  PLAN OF CARE: Admit to inpatient   PATIENT DISPOSITION:  PACU - hemodynamically stable.   Delay start of Pharmacological VTE agent (>24hrs) due to surgical blood loss or risk of bleeding: yes

## 2017-09-24 NOTE — Transfer of Care (Signed)
Immediate Anesthesia Transfer of Care Note  Patient: Jonathan Marshall  Procedure(s) Performed: XI ROBOT ASSITED LAPAROSCOPIC NEPHROURETERECTOMY (Left ) XI ROBOTIC ASSISTED LAPAROSCOPIC EXTENSIVE LYSIS OF ADHESION  Patient Location: PACU  Anesthesia Type:General  Level of Consciousness: awake, alert  and oriented  Airway & Oxygen Therapy: Patient Spontanous Breathing and Patient connected to face mask oxygen  Post-op Assessment: Report given to RN and Post -op Vital signs reviewed and stable  Post vital signs: Reviewed  Last Vitals:  Vitals Value Taken Time  BP 141/76 09/24/2017  4:08 PM  Temp    Pulse 78 09/24/2017  4:10 PM  Resp 10 09/24/2017  4:10 PM  SpO2 100 % 09/24/2017  4:10 PM  Vitals shown include unvalidated device data.  Last Pain:  Vitals:   09/24/17 1004  TempSrc: Oral         Complications: No apparent anesthesia complications

## 2017-09-24 NOTE — Discharge Instructions (Signed)

## 2017-09-24 NOTE — Anesthesia Preprocedure Evaluation (Addendum)
Anesthesia Evaluation  Patient identified by MRN, date of birth, ID band Patient awake    Reviewed: Allergy & Precautions, NPO status , Patient's Chart, lab work & pertinent test results  Airway Mallampati: III  TM Distance: >3 FB Neck ROM: Full    Dental no notable dental hx.    Pulmonary neg pulmonary ROS,    Pulmonary exam normal breath sounds clear to auscultation       Cardiovascular negative cardio ROS Normal cardiovascular exam Rhythm:Regular Rate:Normal  ECHO: Normal LV size with mild LV hypertrophy. EF 55%. Normal RV size and systolic function. No significant valvular abnormalities.   Neuro/Psych PSYCHIATRIC DISORDERS Depression negative neurological ROS     GI/Hepatic Neg liver ROS, History of colon cancer s/p chemo   Endo/Other  negative endocrine ROS  Renal/GU Renal disease     Musculoskeletal negative musculoskeletal ROS (+)   Abdominal   Peds  Hematology  (+) anemia , HLD   Anesthesia Other Findings LEFT URETERAL CANCER  Reproductive/Obstetrics                            Anesthesia Physical Anesthesia Plan  ASA: II  Anesthesia Plan: General   Post-op Pain Management:    Induction: Intravenous  PONV Risk Score and Plan: 3 and Ondansetron, Dexamethasone and Treatment may vary due to age or medical condition  Airway Management Planned: Oral ETT  Additional Equipment:   Intra-op Plan:   Post-operative Plan: Extubation in OR  Informed Consent: I have reviewed the patients History and Physical, chart, labs and discussed the procedure including the risks, benefits and alternatives for the proposed anesthesia with the patient or authorized representative who has indicated his/her understanding and acceptance.   Dental advisory given  Plan Discussed with: CRNA  Anesthesia Plan Comments:         Anesthesia Quick Evaluation

## 2017-09-25 ENCOUNTER — Encounter (HOSPITAL_COMMUNITY): Payer: Self-pay | Admitting: Urology

## 2017-09-25 LAB — BASIC METABOLIC PANEL
ANION GAP: 8 (ref 5–15)
BUN: 20 mg/dL (ref 8–23)
CO2: 22 mmol/L (ref 22–32)
CREATININE: 1.85 mg/dL — AB (ref 0.61–1.24)
Calcium: 8.9 mg/dL (ref 8.9–10.3)
Chloride: 107 mmol/L (ref 98–111)
GFR calc non Af Amer: 32 mL/min — ABNORMAL LOW (ref >60)
GFR, EST AFRICAN AMERICAN: 37 mL/min — AB (ref >60)
Glucose, Bld: 152 mg/dL — ABNORMAL HIGH (ref 70–99)
Potassium: 4 mmol/L (ref 3.5–5.1)
SODIUM: 137 mmol/L (ref 135–145)

## 2017-09-25 LAB — HEMOGLOBIN AND HEMATOCRIT, BLOOD
HCT: 26.4 % — ABNORMAL LOW (ref 39.0–52.0)
Hemoglobin: 8.4 g/dL — ABNORMAL LOW (ref 13.0–17.0)

## 2017-09-25 LAB — ABO/RH: ABO/RH(D): O POS

## 2017-09-25 MED ORDER — OXYBUTYNIN CHLORIDE 5 MG PO TABS
5.0000 mg | ORAL_TABLET | Freq: Once | ORAL | Status: AC
  Administered 2017-09-25: 5 mg via ORAL
  Filled 2017-09-25: qty 1

## 2017-09-25 MED ORDER — LORAZEPAM 2 MG/ML IJ SOLN
0.5000 mg | Freq: Once | INTRAMUSCULAR | Status: AC
  Administered 2017-09-25: 0.5 mg via INTRAVENOUS
  Filled 2017-09-25: qty 1

## 2017-09-25 MED ORDER — LORAZEPAM 2 MG/ML IJ SOLN
1.0000 mg | Freq: Once | INTRAMUSCULAR | Status: AC
  Administered 2017-09-25: 1 mg via INTRAVENOUS
  Filled 2017-09-25: qty 1

## 2017-09-25 NOTE — Progress Notes (Signed)
Urology Progress Note   1 Day Post-Op  Subjective: Anxiety overnight controlled with 1x dose anxiolytic AFVSS UOP adequate Tolerating CLD No flatus yet Not yet ambulating  Objective: Vital signs in last 24 hours: Temp:  [97.5 F (36.4 C)-98.5 F (36.9 C)] 97.8 F (36.6 C) (08/01 0934) Pulse Rate:  [63-79] 63 (08/01 0934) Resp:  [13-18] 18 (08/01 0934) BP: (108-148)/(55-106) 108/55 (08/01 0934) SpO2:  [98 %-100 %] 99 % (08/01 0934)  Intake/Output from previous day: 07/31 0701 - 08/01 0700 In: 4368.7 [P.O.:222; I.V.:3776.7; IV Piggyback:300] Out: 935 [Urine:625; Drains:210; Blood:100] Intake/Output this shift: Total I/O In: 506.7 [P.O.:240; I.V.:266.7] Out: -   Physical Exam:  General: Alert and oriented CV: RRR Lungs: Clear Abdomen: Soft, appropriately tender. Incisions c/d/i GU: Foley in place draining watermelon-colored urine Ext: NT, No erythema  Lab Results: Recent Labs    09/24/17 1628 09/25/17 0415  HGB 9.1* 8.4*  HCT 28.2* 26.4*   BMET Recent Labs    09/25/17 0415  NA 137  K 4.0  CL 107  CO2 22  GLUCOSE 152*  BUN 20  CREATININE 1.85*  CALCIUM 8.9     Studies/Results: No results found.  Assessment/Plan:  84 y.o. male s/p robotic left nephroureterectomy.  Overall doing well post-op.   - [place], regular diet today - Encourage ambulation/IS - Continue Foley catheter and JP drain - Likely discharge tomorrow, plan to repeat am H/H   Dispo: Floor   LOS: 1 day   Case Rob Bunting 09/25/2017, 11:56 AM

## 2017-09-25 NOTE — Anesthesia Postprocedure Evaluation (Signed)
Anesthesia Post Note  Patient: Jonathan Marshall  Procedure(s) Performed: XI ROBOT ASSITED LAPAROSCOPIC NEPHROURETERECTOMY (Left ) XI ROBOTIC ASSISTED LAPAROSCOPIC EXTENSIVE LYSIS OF ADHESION     Patient location during evaluation: PACU Anesthesia Type: General Level of consciousness: awake and alert Pain management: pain level controlled Vital Signs Assessment: post-procedure vital signs reviewed and stable Respiratory status: spontaneous breathing, nonlabored ventilation, respiratory function stable and patient connected to nasal cannula oxygen Cardiovascular status: blood pressure returned to baseline and stable Postop Assessment: no apparent nausea or vomiting Anesthetic complications: no    Last Vitals:  Vitals:   09/25/17 0246 09/25/17 0534  BP: 112/63 (!) 134/106  Pulse: 71 64  Resp: 18 18  Temp: 36.9 C 36.6 C  SpO2: 98% 100%    Last Pain:  Vitals:   09/25/17 0626  TempSrc:   PainSc: Asleep   Pain Goal: Patients Stated Pain Goal: 2 (09/24/17 2047)               Thurmond Butts P Ellender

## 2017-09-25 NOTE — Progress Notes (Signed)
Patient notified RN staff that he was continuing to leak around foley catheter. Patient had saturated bed pads. RN irrigated foley catheter several times and removed several small clots each time. On call was notified and new orders received for Ditropan po times one and IV Ativan times one for patient's anxiety.  Will continue to closely monitor patient.

## 2017-09-25 NOTE — Progress Notes (Signed)
Patient had an anxiety attack overnight. Notified MD on call and order received for a one time dose of IV ativan. Pt resting well in chair. Will continue to monitor patient.

## 2017-09-26 LAB — HEMOGLOBIN AND HEMATOCRIT, BLOOD
HEMATOCRIT: 25.8 % — AB (ref 39.0–52.0)
HEMOGLOBIN: 8.3 g/dL — AB (ref 13.0–17.0)

## 2017-09-26 MED ORDER — LORAZEPAM 0.5 MG PO TABS
0.5000 mg | ORAL_TABLET | Freq: Once | ORAL | Status: AC | PRN
  Administered 2017-09-26: 0.5 mg via ORAL
  Filled 2017-09-26: qty 1

## 2017-09-26 MED ORDER — HYDROCODONE-ACETAMINOPHEN 5-325 MG PO TABS: 1.0000 | ORAL_TABLET | ORAL | 0 refills | Status: DC | PRN

## 2017-09-26 NOTE — Progress Notes (Signed)
Urology Progress Note   2 Days Post-Op  Subjective: NAEON AFVSS Hgb stable  UOP adequate Drain output low Tolerating diet Having flatus and BMx2 yesterday  Objective: Vital signs in last 24 hours: Temp:  [97.5 F (36.4 C)-98.6 F (37 C)] 98.6 F (37 C) (08/02 0446) Pulse Rate:  [79-93] 85 (08/02 0446) Resp:  [16-20] 16 (08/02 0446) BP: (112-124)/(59-76) 124/76 (08/02 0446) SpO2:  [94 %-100 %] 97 % (08/02 0446)  Intake/Output from previous day: 08/01 0701 - 08/02 0700 In: 2126.6 [P.O.:480; I.V.:1406.6] Out: 2016 [Urine:1900; Drains:116] Intake/Output this shift: No intake/output data recorded.  Physical Exam:  General: Alert and oriented CV: RRR Lungs: Clear Abdomen: Soft, appropriately tender. Incisions c/d/i GU: Foley in place draining watermelon-colored urine Ext: NT, No erythema  Lab Results: Recent Labs    09/24/17 1628 09/25/17 0415 09/26/17 0419  HGB 9.1* 8.4* 8.3*  HCT 28.2* 26.4* 25.8*   BMET Recent Labs    09/25/17 0415  NA 137  K 4.0  CL 107  CO2 22  GLUCOSE 152*  BUN 20  CREATININE 1.85*  CALCIUM 8.9     Studies/Results: No results found.  Assessment/Plan:  84 y.o. male s/p robotic left nephroureterectomy. Overall doing well post-op.  - Discontinue JP drain today - Continue Foley catheter - Encourage ambulation/IS - Likely discharge today   Dispo: Floor   LOS: 2 days   Dayne Chait Rob Bunting 09/26/2017, 11:23 AM

## 2017-09-26 NOTE — Discharge Summary (Signed)
[facility] Discharge Summary  Admit date: 09/24/2017  Discharge date and time: 09/28/2017  Discharge to: Home  Discharge Service: Urology  Discharge Attending Physician: Nicolette Bang, MD  Discharge  Diagnoses: Left UTUC  Secondary Diagnosis: Active Problems:   Urothelial carcinoma ([place])   OR Procedures: Procedure(s): XI ROBOT ASSITED LAPAROSCOPIC NEPHROURETERECTOMY XI ROBOTIC ASSISTED LAPAROSCOPIC EXTENSIVE LYSIS OF ADHESION 09/24/2017   Ancillary Procedures: None   Discharge Day Services: The patient was seen and examined by the Urology team both in the morning and immediately prior to discharge.  Vital signs and laboratory values were stable and within normal limits.  The physical exam was benign and unchanged and all surgical wounds were examined.  Discharge instructions were explained and all questions answered.  Subjective  No acute events overnight. Pain Controlled. No fever or chills.  Objective Patient Vitals for the past 8 hrs:  BP Temp Temp src Pulse Resp SpO2  09/26/17 0446 124/76 98.6 F (37 C) Oral 85 16 97 %   No intake/output data recorded.  General Appearance:        No acute distress Lungs:                       Normal work of breathing on room air Heart:                                Regular rate and rhythm Abdomen:                         Soft, non-tender, non-distended, incisions c/d/i GU:        Foley catheter draining thin watermelon-colored urine Extremities:                      Warm and well perfused   Hospital Course:  The patient underwent robotic left nephroureterectomy on 09/24/2017.  The patient tolerated the procedure well, was extubated in the OR, and afterwards was taken to the PACU for routine post-surgical care. When stable the patient was transferred to the floor.   The patient did well postoperatively.  The patient's diet was slowly advanced and at the time of discharge was tolerating a regular diet.  The patient was discharged  home 4 Days Post-Op, at which point was tolerating a regular solid diet, was making adequate urine, having adequate pain control with P.O. pain medication, and could ambulate without difficulty. The patient will follow up with [place] for post op check and TOV with cystogram prior.  Condition at Discharge: Improved  Discharge Medications:  Allergies as of 09/26/2017   No Known Allergies     Medication List    TAKE these medications   cefpodoxime 200 MG tablet Commonly known as:  VANTIN Take 200 mg by mouth 2 (two) times daily. Started Thursday 09-18-17 in pm x 7 days   clotrimazole 1 % cream Commonly known as:  LOTRIMIN Apply 1 application topically 2 (two) times daily as needed (for foot rash).   diphenhydramine-acetaminophen 25-500 MG Tabs tablet Commonly known as:  TYLENOL PM Take 2 tablets by mouth at bedtime.   fluticasone 50 MCG/ACT nasal spray Commonly known as:  FLONASE Place 1 spray into both nostrils daily as needed (for allergies during SPRING months ONLY).   HYDROcodone-acetaminophen 5-325 MG tablet Commonly known as:  NORCO/VICODIN Take 1 tablet by mouth every 4 (four) hours as needed for  moderate pain.   latanoprost 0.005 % ophthalmic solution Commonly known as:  XALATAN Place 1 drop into both eyes at bedtime.   lidocaine-prilocaine cream Commonly known as:  EMLA Apply 1 application topically as needed. For use one hour prior to port access. Place quarter-size amount over port and cover with press-n-seal.   LORazepam 0.5 MG tablet Commonly known as:  ATIVAN Take 1 tablet (0.5 mg total) by mouth every 6 (six) hours as needed (Nausea or vomiting).   lovastatin 40 MG tablet Commonly known as:  MEVACOR Take 40 mg by mouth every evening.   ondansetron 8 MG tablet Commonly known as:  ZOFRAN Take 1 tablet (8 mg total) by mouth 2 (two) times daily as needed for refractory nausea / vomiting. Start on day 3 after cytoxan chemorx   predniSONE 20 MG tablet Commonly  known as:  DELTASONE Take 3 tablets (60 mg total) by mouth daily. Take on days 1-5 of chemotherapy.   prochlorperazine 10 MG tablet Commonly known as:  COMPAZINE Take 1 tablet (10 mg total) by mouth every 6 (six) hours as needed (Nausea or vomiting).   sertraline 25 MG tablet Commonly known as:  ZOLOFT Take 25 mg by mouth every morning.

## 2017-09-26 NOTE — Care Management Important Message (Signed)
Important Message  Patient Details  Name: JUSTUN ANAYA MRN: [id number] Date of Birth: 02-Jan-1933   Medicare Important Message Given:  Yes    Kerin Salen 09/26/2017, 10:46 AMImportant Message  Patient Details  Name: DYLANN GALLIER MRN: [id number] Date of Birth: 1932/04/13   Medicare Important Message Given:  Yes    Kerin Salen 09/26/2017, 10:46 AM

## 2017-09-27 MED ORDER — ZOLPIDEM TARTRATE 5 MG PO TABS
5.0000 mg | ORAL_TABLET | Freq: Every evening | ORAL | Status: DC | PRN
  Administered 2017-09-27: 5 mg via ORAL
  Filled 2017-09-27: qty 1

## 2017-09-27 NOTE — Plan of Care (Signed)
  Problem: Clinical Measurements: Goal: Ability to maintain clinical measurements within normal limits will improve Outcome: Progressing Goal: Will remain free from infection Outcome: Progressing Goal: Diagnostic test results will improve Outcome: Progressing Goal: Respiratory complications will improve Outcome: Progressing Goal: Cardiovascular complication will be avoided Outcome: Progressing   Problem: Activity: Goal: Risk for activity intolerance will decrease Outcome: Progressing   Problem: Nutrition: Goal: Adequate nutrition will be maintained Outcome: Progressing   Problem: Coping: Goal: Level of anxiety will decrease Outcome: Progressing   Problem: Pain Managment: Goal: General experience of comfort will improve Outcome: Progressing   Problem: Safety: Goal: Ability to remain free from injury will improve Outcome: Progressing   Problem: Skin Integrity: Goal: Risk for impaired skin integrity will decrease Outcome: Progressing   Problem: Clinical Measurements: Goal: Ability to maintain clinical measurements within normal limits will improve Outcome: Progressing Goal: Postoperative complications will be avoided or minimized Outcome: Progressing   Problem: Skin Integrity: Goal: Demonstration of wound healing without infection will improve Outcome: Progressing

## 2017-09-27 NOTE — Plan of Care (Signed)
  Problem: Health Behavior/Discharge Planning: Goal: Ability to manage health-related needs will improve Outcome: Progressing   Problem: Clinical Measurements: Goal: Ability to maintain clinical measurements within normal limits will improve Outcome: Progressing Goal: Will remain free from infection Outcome: Progressing Goal: Diagnostic test results will improve Outcome: Progressing Goal: Respiratory complications will improve Outcome: Progressing Goal: Cardiovascular complication will be avoided Outcome: Progressing   Problem: Activity: Goal: Risk for activity intolerance will decrease Outcome: Progressing   Problem: Nutrition: Goal: Adequate nutrition will be maintained Outcome: Progressing   Problem: Coping: Goal: Level of anxiety will decrease Outcome: Progressing   Problem: Pain Managment: Goal: General experience of comfort will improve Outcome: Progressing   Problem: Safety: Goal: Ability to remain free from injury will improve Outcome: Progressing   Problem: Skin Integrity: Goal: Risk for impaired skin integrity will decrease Outcome: Progressing   Problem: Clinical Measurements: Goal: Ability to maintain clinical measurements within normal limits will improve Outcome: Progressing Goal: Postoperative complications will be avoided or minimized Outcome: Progressing   Problem: Skin Integrity: Goal: Demonstration of wound healing without infection will improve Outcome: Progressing

## 2017-09-27 NOTE — Progress Notes (Signed)
Urology Progress Note   3 Days Post-Op  Subjective: AFVSS UOP adequate Tolerating diet Having flatus and BMx2 yesterday and once today Vomiting this morning and poor PO intake overt he last 24 hours  Objective: Vital signs in last 24 hours: Temp:  [98.3 F (36.8 C)-99.1 F (37.3 C)] 98.3 F (36.8 C) (08/03 0504) Pulse Rate:  [79-101] 79 (08/03 0504) Resp:  [18-32] 18 (08/03 0504) BP: (121-136)/(66-74) 136/69 (08/03 0504) SpO2:  [95 %-98 %] 98 % (08/03 0504)  Intake/Output from previous day: 08/02 0701 - 08/03 0700 In: 1030.6 [P.O.:120; I.V.:910.6] Out: 800 [Urine:750; Drains:50] Intake/Output this shift: Total I/O In: 300 [P.O.:100; I.V.:200] Out: 150 [Urine:150]  Physical Exam:  General: Alert and oriented CV: RRR Lungs: Clear Abdomen: Soft, appropriately tender. Incisions c/d/i GU: Foley in place draining watermelon-colored urine Ext: NT, No erythema  Lab Results: Recent Labs    09/24/17 1628 09/25/17 0415 09/26/17 0419  HGB 9.1* 8.4* 8.3*  HCT 28.2* 26.4* 25.8*   BMET Recent Labs    09/25/17 0415  NA 137  K 4.0  CL 107  CO2 22  GLUCOSE 152*  BUN 20  CREATININE 1.85*  CALCIUM 8.9     Studies/Results: No results found.  Assessment/Plan:  84 y.o. male s/p robotic left nephroureterectomy. Overall doing well post-op.  - Continue Foley catheter - Encourage ambulation/IS - likely discharge today versus tomorrow morning   Dispo: Floor   LOS: 3 days   Nicolette Bang 09/27/2017, 11:30 AM

## 2017-09-28 NOTE — Progress Notes (Signed)
Reviewed discharge information with patient and wife. Answered all questions. Pt/wife able to teach back medications and reasons to contact MD/911. Patient verbalizes importance of urology follow up appointment. Pt/wife able to demonstrate and teach back foley care and changing to leg bag.  Barbee Shropshire. Brigitte Pulse, RN

## 2017-10-07 ENCOUNTER — Telehealth: Payer: Self-pay

## 2017-10-07 ENCOUNTER — Telehealth: Payer: Self-pay | Admitting: Hematology

## 2017-10-07 NOTE — Telephone Encounter (Signed)
Patients wife called stating that patient is experiencing a decline including vomiting, liquid stools, weakness, confusion, and fell 3 times since yesterday.  I sent a scheduling message for him to be seen today in Symptom Management.  Sandi Mealy is aware.

## 2017-10-07 NOTE — Telephone Encounter (Signed)
Spoke with wife re [place] 8/14.

## 2017-10-08 ENCOUNTER — Inpatient Hospital Stay (HOSPITAL_COMMUNITY): Payer: Medicare Other

## 2017-10-08 ENCOUNTER — Inpatient Hospital Stay (HOSPITAL_COMMUNITY)
Admission: EM | Admit: 2017-10-08 | Discharge: 2017-10-14 | DRG: 699 | Disposition: A | Discharge status: Home / Self Care | Payer: Medicare Other | Attending: Internal Medicine | Admitting: Internal Medicine

## 2017-10-08 ENCOUNTER — Inpatient Hospital Stay: Payer: Medicare Other | Attending: Hematology | Admitting: Medical

## 2017-10-08 ENCOUNTER — Other Ambulatory Visit: Payer: Self-pay

## 2017-10-08 ENCOUNTER — Inpatient Hospital Stay: Payer: Medicare Other

## 2017-10-08 ENCOUNTER — Inpatient Hospital Stay (HOSPITAL_BASED_OUTPATIENT_CLINIC_OR_DEPARTMENT_OTHER): Payer: Medicare Other | Admitting: Medical

## 2017-10-08 ENCOUNTER — Encounter (HOSPITAL_COMMUNITY): Payer: Self-pay | Admitting: Obstetrics and Gynecology

## 2017-10-08 VITALS — BP 100/70 | HR 81 | Temp 97.9°F | Resp 22

## 2017-10-08 DIAGNOSIS — N179 Acute kidney failure, unspecified: Secondary | ICD-10-CM | POA: Diagnosis present

## 2017-10-08 DIAGNOSIS — G4700 Insomnia, unspecified: Secondary | ICD-10-CM | POA: Diagnosis present

## 2017-10-08 DIAGNOSIS — N183 Chronic kidney disease, stage 3 (moderate): Secondary | ICD-10-CM | POA: Diagnosis present

## 2017-10-08 DIAGNOSIS — H409 Unspecified glaucoma: Secondary | ICD-10-CM

## 2017-10-08 DIAGNOSIS — R197 Diarrhea, unspecified: Secondary | ICD-10-CM

## 2017-10-08 DIAGNOSIS — R630 Anorexia: Secondary | ICD-10-CM

## 2017-10-08 DIAGNOSIS — N401 Enlarged prostate with lower urinary tract symptoms: Secondary | ICD-10-CM | POA: Diagnosis present

## 2017-10-08 DIAGNOSIS — N189 Chronic kidney disease, unspecified: Secondary | ICD-10-CM

## 2017-10-08 DIAGNOSIS — F329 Major depressive disorder, single episode, unspecified: Secondary | ICD-10-CM | POA: Diagnosis present

## 2017-10-08 DIAGNOSIS — R410 Disorientation, unspecified: Secondary | ICD-10-CM

## 2017-10-08 DIAGNOSIS — R6889 Other general symptoms and signs: Secondary | ICD-10-CM

## 2017-10-08 DIAGNOSIS — E785 Hyperlipidemia, unspecified: Secondary | ICD-10-CM

## 2017-10-08 DIAGNOSIS — Z66 Do not resuscitate: Secondary | ICD-10-CM | POA: Diagnosis present

## 2017-10-08 DIAGNOSIS — F32A Depression, unspecified: Secondary | ICD-10-CM

## 2017-10-08 DIAGNOSIS — D649 Anemia, unspecified: Secondary | ICD-10-CM

## 2017-10-08 DIAGNOSIS — Z7951 Long term (current) use of inhaled steroids: Secondary | ICD-10-CM | POA: Diagnosis not present

## 2017-10-08 DIAGNOSIS — N138 Other obstructive and reflux uropathy: Secondary | ICD-10-CM | POA: Diagnosis present

## 2017-10-08 DIAGNOSIS — E878 Other disorders of electrolyte and fluid balance, not elsewhere classified: Secondary | ICD-10-CM | POA: Diagnosis present

## 2017-10-08 DIAGNOSIS — C689 Malignant neoplasm of urinary organ, unspecified: Secondary | ICD-10-CM | POA: Diagnosis present

## 2017-10-08 DIAGNOSIS — E876 Hypokalemia: Secondary | ICD-10-CM | POA: Diagnosis present

## 2017-10-08 DIAGNOSIS — E872 Acidosis: Secondary | ICD-10-CM | POA: Diagnosis present

## 2017-10-08 DIAGNOSIS — R531 Weakness: Secondary | ICD-10-CM

## 2017-10-08 DIAGNOSIS — E440 Moderate protein-calorie malnutrition: Secondary | ICD-10-CM

## 2017-10-08 DIAGNOSIS — E860 Dehydration: Secondary | ICD-10-CM | POA: Diagnosis present

## 2017-10-08 DIAGNOSIS — N390 Urinary tract infection, site not specified: Secondary | ICD-10-CM

## 2017-10-08 DIAGNOSIS — K529 Noninfective gastroenteritis and colitis, unspecified: Secondary | ICD-10-CM | POA: Diagnosis present

## 2017-10-08 DIAGNOSIS — Z8559 Personal history of malignant neoplasm of other urinary tract organ: Secondary | ICD-10-CM

## 2017-10-08 DIAGNOSIS — Z905 Acquired absence of kidney: Secondary | ICD-10-CM

## 2017-10-08 DIAGNOSIS — Z9079 Acquired absence of other genital organ(s): Secondary | ICD-10-CM

## 2017-10-08 DIAGNOSIS — C8333 Diffuse large B-cell lymphoma, intra-abdominal lymph nodes: Secondary | ICD-10-CM

## 2017-10-08 DIAGNOSIS — C662 Malignant neoplasm of left ureter: Secondary | ICD-10-CM

## 2017-10-08 DIAGNOSIS — Z6826 Body mass index (BMI) 26.0-26.9, adult: Secondary | ICD-10-CM | POA: Diagnosis not present

## 2017-10-08 DIAGNOSIS — A4151 Sepsis due to Escherichia coli [E. coli]: Secondary | ICD-10-CM | POA: Diagnosis not present

## 2017-10-08 DIAGNOSIS — C8330 Diffuse large B-cell lymphoma, unspecified site: Secondary | ICD-10-CM | POA: Diagnosis present

## 2017-10-08 DIAGNOSIS — Z79899 Other long term (current) drug therapy: Secondary | ICD-10-CM

## 2017-10-08 DIAGNOSIS — H9190 Unspecified hearing loss, unspecified ear: Secondary | ICD-10-CM | POA: Diagnosis present

## 2017-10-08 DIAGNOSIS — Z85038 Personal history of other malignant neoplasm of large intestine: Secondary | ICD-10-CM

## 2017-10-08 DIAGNOSIS — T83511A Infection and inflammatory reaction due to indwelling urethral catheter, initial encounter: Principal | ICD-10-CM | POA: Diagnosis present

## 2017-10-08 DIAGNOSIS — Z6825 Body mass index (BMI) 25.0-25.9, adult: Secondary | ICD-10-CM | POA: Diagnosis not present

## 2017-10-08 LAB — MAGNESIUM
MAGNESIUM: 2.4 mg/dL (ref 1.7–2.4)
Magnesium: 2.3 mg/dL (ref 1.7–2.4)

## 2017-10-08 LAB — URINALYSIS, COMPLETE (UACMP) WITH MICROSCOPIC
BILIRUBIN URINE: NEGATIVE
Glucose, UA: NEGATIVE mg/dL
KETONES UR: NEGATIVE mg/dL
NITRITE: NEGATIVE
PH: 5 (ref 5.0–8.0)
Protein, ur: 100 mg/dL — AB
SPECIFIC GRAVITY, URINE: 1.016 (ref 1.005–1.030)
WBC, UA: >50 WBC/hpf — ABNORMAL HIGH (ref 0–5)

## 2017-10-08 LAB — CMP (CANCER CENTER ONLY)
ALK PHOS: 93 U/L (ref 38–126)
ALT: 39 U/L (ref 0–44)
ANION GAP: 18 — AB (ref 5–15)
AST: 33 U/L (ref 15–41)
Albumin: 3.2 g/dL — ABNORMAL LOW (ref 3.5–5.0)
BILIRUBIN TOTAL: 0.3 mg/dL (ref 0.3–1.2)
BUN: 82 mg/dL — AB (ref 8–23)
CO2: 12 mmol/L — ABNORMAL LOW (ref 22–32)
CREATININE: 4.4 mg/dL — AB (ref 0.61–1.24)
Calcium: 10 mg/dL (ref 8.9–10.3)
Chloride: 106 mmol/L (ref 98–111)
GFR, EST AFRICAN AMERICAN: 13 mL/min — AB (ref >60)
GFR, Estimated: 11 mL/min — ABNORMAL LOW (ref >60)
Glucose, Bld: 107 mg/dL — ABNORMAL HIGH (ref 70–99)
Potassium: 3.7 mmol/L (ref 3.5–5.1)
Sodium: 136 mmol/L (ref 135–145)
Total Protein: 7.3 g/dL (ref 6.5–8.1)

## 2017-10-08 LAB — CBC WITH DIFFERENTIAL (CANCER CENTER ONLY)
Basophils Absolute: 0 10*3/uL (ref 0.0–0.1)
Basophils Relative: 0 %
Eosinophils Absolute: 0.8 10*3/uL — ABNORMAL HIGH (ref 0.0–0.5)
Eosinophils Relative: 6 %
HEMATOCRIT: 30.7 % — AB (ref 38.4–49.9)
Hemoglobin: 10.1 g/dL — ABNORMAL LOW (ref 13.0–17.1)
LYMPHS ABS: 1.6 10*3/uL (ref 0.9–3.3)
Lymphocytes Relative: 12 %
MCH: 30.4 pg (ref 27.2–33.4)
MCHC: 32.9 g/dL (ref 32.0–36.0)
MCV: 92.5 fL (ref 79.3–98.0)
Monocytes Absolute: 0.9 10*3/uL (ref 0.1–0.9)
Monocytes Relative: 6 %
NEUTROS ABS: 10.4 10*3/uL — AB (ref 1.5–6.5)
Neutrophils Relative %: 76 %
PLATELETS: 415 10*3/uL — AB (ref 140–400)
RBC: 3.32 MIL/uL — ABNORMAL LOW (ref 4.20–5.82)
RDW: 14.1 % (ref 11.0–14.6)
WBC Count: 13.7 10*3/uL — ABNORMAL HIGH (ref 4.0–10.3)

## 2017-10-08 LAB — PHOSPHORUS: Phosphorus: 5.2 mg/dL — ABNORMAL HIGH (ref 2.5–4.6)

## 2017-10-08 LAB — CBC
HEMATOCRIT: 25.4 % — AB (ref 39.0–52.0)
Hemoglobin: 8.5 g/dL — ABNORMAL LOW (ref 13.0–17.0)
MCH: 30.8 pg (ref 26.0–34.0)
MCHC: 33.5 g/dL (ref 30.0–36.0)
MCV: 92 fL (ref 78.0–100.0)
PLATELETS: 450 10*3/uL — AB (ref 150–400)
RBC: 2.76 MIL/uL — ABNORMAL LOW (ref 4.22–5.81)
RDW: 14.2 % (ref 11.5–15.5)
WBC: 11 10*3/uL — AB (ref 4.0–10.5)

## 2017-10-08 LAB — I-STAT CG4 LACTIC ACID, ED: Lactic Acid, Venous: 1.17 mmol/L (ref 0.5–1.9)

## 2017-10-08 LAB — CREATININE, SERUM
Creatinine, Ser: 4.07 mg/dL — ABNORMAL HIGH (ref 0.61–1.24)
GFR, EST AFRICAN AMERICAN: 14 mL/min — AB (ref >60)
GFR, EST NON AFRICAN AMERICAN: 12 mL/min — AB (ref >60)

## 2017-10-08 LAB — TSH: TSH: 0.529 u[IU]/mL (ref 0.350–4.500)

## 2017-10-08 MED ORDER — ACETAMINOPHEN 325 MG PO TABS: 650.0000 mg | ORAL_TABLET | Freq: Four times a day (QID) | ORAL | Status: DC | PRN

## 2017-10-08 MED ORDER — ONDANSETRON HCL 4 MG PO TABS
4.0000 mg | ORAL_TABLET | Freq: Four times a day (QID) | ORAL | Status: DC | PRN
  Administered 2017-10-14: 4 mg via ORAL
  Filled 2017-10-08: qty 1

## 2017-10-08 MED ORDER — ACETAMINOPHEN 500 MG PO TABS
1000.0000 mg | ORAL_TABLET | Freq: Every day | ORAL | Status: DC
  Administered 2017-10-08 – 2017-10-09 (×2): 1000 mg via ORAL
  Filled 2017-10-08 (×2): qty 2

## 2017-10-08 MED ORDER — SODIUM CHLORIDE 0.9 % IV SOLN
INTRAVENOUS | Status: AC
  Administered 2017-10-08: 11:00:00 via INTRAVENOUS
  Filled 2017-10-08 (×2): qty 250

## 2017-10-08 MED ORDER — ONDANSETRON HCL 4 MG/2ML IJ SOLN
4.0000 mg | Freq: Four times a day (QID) | INTRAMUSCULAR | Status: DC | PRN
  Administered 2017-10-11 – 2017-10-12 (×2): 4 mg via INTRAVENOUS
  Filled 2017-10-08 (×2): qty 2

## 2017-10-08 MED ORDER — SODIUM CHLORIDE 0.9 % IV SOLN
INTRAVENOUS | Status: AC
  Administered 2017-10-08 – 2017-10-09 (×2): via INTRAVENOUS

## 2017-10-08 MED ORDER — ACETAMINOPHEN 650 MG RE SUPP
650.0000 mg | Freq: Four times a day (QID) | RECTAL | Status: DC | PRN
Start: 2017-10-08 — End: 2017-10-14

## 2017-10-08 MED ORDER — PIPERACILLIN-TAZOBACTAM 3.375 G IVPB
3.3750 g | Freq: Once | INTRAVENOUS | Status: AC
  Administered 2017-10-08: 3.375 g via INTRAVENOUS
  Filled 2017-10-08: qty 50

## 2017-10-08 MED ORDER — SENNA 8.6 MG PO TABS
1.0000 | ORAL_TABLET | Freq: Two times a day (BID) | ORAL | Status: DC
  Administered 2017-10-09: 8.6 mg via ORAL
  Filled 2017-10-08 (×4): qty 1

## 2017-10-08 MED ORDER — LORAZEPAM 0.5 MG PO TABS: 0.5000 mg | ORAL_TABLET | Freq: Four times a day (QID) | ORAL | Status: DC | PRN

## 2017-10-08 MED ORDER — PRAVASTATIN SODIUM 20 MG PO TABS
40.0000 mg | ORAL_TABLET | Freq: Every day | ORAL | Status: DC
  Administered 2017-10-08 – 2017-10-13 (×6): 40 mg via ORAL
  Filled 2017-10-08 (×7): qty 2

## 2017-10-08 MED ORDER — SODIUM CHLORIDE 0.9 % IV BOLUS
1000.0000 mL | Freq: Once | INTRAVENOUS | Status: AC
  Administered 2017-10-08: 1000 mL via INTRAVENOUS

## 2017-10-08 MED ORDER — SERTRALINE HCL 25 MG PO TABS
25.0000 mg | ORAL_TABLET | Freq: Every day | ORAL | Status: DC
  Administered 2017-10-08 – 2017-10-14 (×7): 25 mg via ORAL
  Filled 2017-10-08 (×7): qty 1

## 2017-10-08 MED ORDER — LATANOPROST 0.005 % OP SOLN
1.0000 [drp] | Freq: Every day | OPHTHALMIC | Status: DC
  Filled 2017-10-08: qty 2.5

## 2017-10-08 MED ORDER — SODIUM CHLORIDE 0.9 % IV SOLN
1.0000 g | INTRAVENOUS | Status: DC
  Administered 2017-10-08 – 2017-10-10 (×3): 1 g via INTRAVENOUS
  Filled 2017-10-08: qty 10
  Filled 2017-10-08 (×2): qty 1

## 2017-10-08 MED ORDER — HEPARIN SODIUM (PORCINE) 5000 UNIT/ML IJ SOLN
5000.0000 [IU] | Freq: Three times a day (TID) | INTRAMUSCULAR | Status: DC
  Administered 2017-10-08 – 2017-10-14 (×16): 5000 [IU] via SUBCUTANEOUS
  Filled 2017-10-08 (×16): qty 1

## 2017-10-08 MED ORDER — HYDROCODONE-ACETAMINOPHEN 5-325 MG PO TABS
1.0000 | ORAL_TABLET | ORAL | Status: DC | PRN
  Administered 2017-10-11: 1 via ORAL
  Filled 2017-10-08: qty 1

## 2017-10-08 MED ORDER — DIPHENHYDRAMINE-APAP (SLEEP) 25-500 MG PO TABS: 2.0000 | ORAL_TABLET | Freq: Every day | ORAL | Status: DC

## 2017-10-08 MED ORDER — DIPHENHYDRAMINE HCL 50 MG PO CAPS
50.0000 mg | ORAL_CAPSULE | Freq: Every day | ORAL | Status: DC
  Administered 2017-10-08 – 2017-10-09 (×2): 50 mg via ORAL
  Filled 2017-10-08 (×2): qty 1

## 2017-10-08 NOTE — Patient Instructions (Signed)
Implanted Port Home Guide An implanted port is a type of central line that is placed under the skin. Central lines are used to provide IV access when treatment or nutrition needs to be given through a person's veins. Implanted ports are used for long-term IV access. An implanted port may be placed because:  You need IV medicine that would be irritating to the small veins in your hands or arms.  You need long-term IV medicines, such as antibiotics.  You need IV nutrition for a long period.  You need frequent blood draws for lab tests.  You need dialysis.  Implanted ports are usually placed in the chest area, but they can also be placed in the upper arm, the abdomen, or the leg. An implanted port has two main parts:  Reservoir. The reservoir is round and will appear as a small, raised area under your skin. The reservoir is the part where a needle is inserted to give medicines or draw blood.  Catheter. The catheter is a thin, flexible tube that extends from the reservoir. The catheter is placed into a large vein. Medicine that is inserted into the reservoir goes into the catheter and then into the vein.  How will I care for my incision site? Do not get the incision site wet. Bathe or shower as directed by your health care provider. How is my port accessed? Special steps must be taken to access the port:  Before the port is accessed, a numbing cream can be placed on the skin. This helps numb the skin over the port site.  Your health care provider uses a sterile technique to access the port. ? Your health care provider must put on a mask and sterile gloves. ? The skin over your port is cleaned carefully with an antiseptic and allowed to dry. ? The port is gently pinched between sterile gloves, and a needle is inserted into the port.  Only "non-coring" port needles should be used to access the port. Once the port is accessed, a blood return should be checked. This helps ensure that the port  is in the vein and is not clogged.  If your port needs to remain accessed for a constant infusion, a clear (transparent) bandage will be placed over the needle site. The bandage and needle will need to be changed every week, or as directed by your health care provider.  Keep the bandage covering the needle clean and dry. Do not get it wet. Follow your health care provider's instructions on how to take a shower or bath while the port is accessed.  If your port does not need to stay accessed, no bandage is needed over the port.  What is flushing? Flushing helps keep the port from getting clogged. Follow your health care provider's instructions on how and when to flush the port. Ports are usually flushed with saline solution or a medicine called heparin. The need for flushing will depend on how the port is used.  If the port is used for intermittent medicines or blood draws, the port will need to be flushed: ? After medicines have been given. ? After blood has been drawn. ? As part of routine maintenance.  If a constant infusion is running, the port may not need to be flushed.  How long will my port stay implanted? The port can stay in for as long as your health care provider thinks it is needed. When it is time for the port to come out, surgery will be   done to remove it. The procedure is similar to the one performed when the port was put in. When should I seek immediate medical care? When you have an implanted port, you should seek immediate medical care if:  You notice a bad smell coming from the incision site.  You have swelling, redness, or drainage at the incision site.  You have more swelling or pain at the port site or the surrounding area.  You have a fever that is not controlled with medicine.  This information is not intended to replace advice given to you by your health care provider. Make sure you discuss any questions you have with your health care provider. Document  Released: 02/11/2005 Document Revised: 07/20/2015 Document Reviewed: 10/19/2012 Elsevier Interactive Patient Education  2017 [employer]  

## 2017-10-08 NOTE — ED Notes (Signed)
Bed: WA21 Expected date:  Expected time:  Means of arrival:  Comments: [facility]

## 2017-10-08 NOTE — ED Provider Notes (Signed)
[facility] DEPT Provider Note   CSN: [id number] Arrival date & time: 10/08/17  1115     History   Chief Complaint Chief Complaint  Patient presents with  . Post-op Problem    HPI NORIS KULINSKI is a 84 y.o. male.  HPI  84 year old male presents from the cancer center with weakness.  Patient's been feeling poorly since having his nephroureterectomy on 7/31.  This was for ureteral cancer.  The patient had a Foley catheter and it was removed a couple days ago by urology.  He states that he is been feeling weak ever since the surgery and has not had much of an appetite.  He is trying to drink as many fluids as he can.  However he is had a generalized weakness and dizziness upon standing.  He denies any pain.  He is short of breath on exertion but denies shortness of breath at rest or any cough.  No dysuria.  He went to his oncology office today to be seen and they did labs and urinalysis.  They drew blood culture and start him on Zosyn for a urinary tract infection.  Found to have an acute kidney injury as well with a creatinine of 4.4. No fevers.  Past Medical History:  Diagnosis Date  . BPH (benign prostatic hyperplasia)   . Cancer [facility]) 1993   Colon surgery and chemo , 2-19 left ureteral camcer last chemo tx 08-27-17  . DDD (degenerative disc disease), lumbar    L4-L5  . Depression   . Early stage glaucoma    both eyes  . Hearing loss   . Hematuria, gross   . History of adenomatous polyp of colon   . History of carpal tunnel syndrome    left  . History of colon cancer    dx 1993  s/p  hemicolectomy (malignant tumor x2)  and chemo therapy completed 1993--- no recurrence per pt  . History of shingles   . HOH (hard of hearing)    right ear  . Hyperlipidemia   . Insomnia     Patient Active Problem List   Diagnosis Date Noted  . Urothelial carcinoma ([place]) 09/24/2017  . Port-A-Cath in place 07/16/2017  . Diffuse large B-cell lymphoma ([place])  06/03/2017  . Enlarged prostate with urinary obstruction 09/02/2016    Past Surgical History:  Procedure Laterality Date  . APPENDECTOMY    . CARPAL TUNNEL RELEASE Left 08/13/2013   Procedure: CARPAL TUNNEL RELEASE LEFT WRIST;  Surgeon: Yvette Rack., MD;  Location: [facility];  Service: Orthopedics;  Laterality: Left;  . COLONOSCOPY    . CYSTOSCOPY W/ RETROGRADES Bilateral 04/03/2017   Procedure: CYSTOSCOPY WITH RETROGRADE PYELOGRAM, LEFT URETEROSCOPY, LEFT STENT PLACEMENT;  Surgeon: Franchot Gallo, MD;  Location: [facility];  Service: Urology;  Laterality: Bilateral;  . CYSTOSCOPY W/ URETERAL STENT PLACEMENT Left 04/23/2017   Procedure: CYSTOSCOPY WITH STENT REPLACEMENT;  Surgeon: Alexis Frock, MD;  Location: [facility];  Service: Urology;  Laterality: Left;  . CYSTOSCOPY WITH FULGERATION Left 04/03/2017   Procedure: LEFT URETERAL BIOPSY;  Surgeon: Franchot Gallo, MD;  Location: [facility];  Service: Urology;  Laterality: Left;  . CYSTOSCOPY/RETROGRADE/URETEROSCOPY Left 04/23/2017   Procedure: CYSTOSCOPY/RETROGRADE/URETEROSCOPY, LEFT  URETEROSCOPY WITY  BIOPSY;  Surgeon: Alexis Frock, MD;  Location: [facility];  Service: Urology;  Laterality: Left;  . HEMICOLECTOMY  1993   malignant tumor x2 , colon cancer and Appendectomy  .  INGUINAL HERNIA REPAIR Right 2003  . IR FLUORO GUIDE PORT INSERTION RIGHT  05/30/2017  . IR US GUIDE VASC ACCESS RIGHT  05/30/2017  . MASTOIDECTOMY Right 2015  . ROBOT ASSITED LAPAROSCOPIC NEPHROURETERECTOMY Left 09/24/2017   Procedure: XI ROBOT ASSITED LAPAROSCOPIC NEPHROURETERECTOMY;  Surgeon: Alexis Frock, MD;  Location: [facility];  Service: Urology;  Laterality: Left;  . ROBOTIC ASSISTED LAPAROSCOPIC LYSIS OF ADHESION  09/24/2017   Procedure: XI ROBOTIC ASSISTED LAPAROSCOPIC EXTENSIVE LYSIS OF ADHESION;  Surgeon: Alexis Frock, MD;  Location: [facility];  Service: Urology;;  .  Lonell Face  last one 06/ 2018  . TRANSURETHRAL RESECTION OF PROSTATE N/A 09/02/2016   Procedure: TRANSURETHRAL RESECTION OF THE PROSTATE (TURP);  Surgeon: Franchot Gallo, MD;  Location: [facility];  Service: Urology;  Laterality: N/A;        Home Medications    Prior to Admission medications   Medication Sig Start Date End Date Taking? Authorizing Provider  cefpodoxime (VANTIN) 200 MG tablet Take 200 mg by mouth 2 (two) times daily. Started Thursday 09-18-17 in pm x 7 days    [provider]  clotrimazole (LOTRIMIN) 1 % cream Apply 1 application topically 2 (two) times daily as needed (for foot rash).    [provider]  diphenhydramine-acetaminophen (TYLENOL PM) 25-500 MG TABS tablet Take 2 tablets by mouth at bedtime.     [provider]  fluticasone (FLONASE) 50 MCG/ACT nasal spray Place 1 spray into both nostrils daily as needed (for allergies during SPRING months ONLY).     [provider]  HYDROcodone-acetaminophen (NORCO/VICODIN) 5-325 MG tablet Take 1 tablet by mouth every 4 (four) hours as needed for moderate pain. 09/26/17 09/26/18  Clydene Laming, Case M, MD  latanoprost (XALATAN) 0.005 % ophthalmic solution Place 1 drop into both eyes at bedtime.    [provider]  lidocaine-prilocaine (EMLA) cream Apply 1 application topically as needed. For use one hour prior to port access. Place quarter-size amount over port and cover with press-n-seal. 06/03/17   Brunetta Genera, MD  LORazepam (ATIVAN) 0.5 MG tablet Take 1 tablet (0.5 mg total) by mouth every 6 (six) hours as needed (Nausea or vomiting). 06/03/17   Brunetta Genera, MD  lovastatin (MEVACOR) 40 MG tablet Take 40 mg by mouth every evening.    [provider]  ondansetron (ZOFRAN) 8 MG tablet Take 1 tablet (8 mg total) by mouth 2 (two) times daily as needed for refractory nausea / vomiting. Start on day 3 after cytoxan chemorx Patient not taking: Reported on  09/15/2017 06/03/17   Brunetta Genera, MD  predniSONE (DELTASONE) 20 MG tablet Take 3 tablets (60 mg total) by mouth daily. Take on days 1-5 of chemotherapy. Patient not taking: Reported on 09/15/2017 06/03/17   Brunetta Genera, MD  prochlorperazine (COMPAZINE) 10 MG tablet Take 1 tablet (10 mg total) by mouth every 6 (six) hours as needed (Nausea or vomiting). Patient not taking: Reported on 09/15/2017 06/03/17   Brunetta Genera, MD  sertraline (ZOLOFT) 25 MG tablet Take 25 mg by mouth every morning.     [provider]    Family History No family history on file.  Social History Social History   Tobacco Use  . Smoking status: Never Smoker  . Smokeless tobacco: Never Used  Substance Use Topics  . Alcohol use: Yes    Alcohol/week: 1.0 standard drinks    Types: 1 Glasses of wine per week    Comment: rare  .  Drug use: Never     Allergies   Patient has no known allergies.   Review of Systems Review of Systems  Constitutional: Positive for fatigue. Negative for fever.  Respiratory: Positive for shortness of breath.   Cardiovascular: Negative for chest pain.  Gastrointestinal: Negative for abdominal pain and vomiting.  Genitourinary: Negative for dysuria, flank pain and hematuria.  Musculoskeletal: Negative for back pain.  Neurological: Positive for dizziness and weakness.  All other systems reviewed and are negative.    Physical Exam Updated Vital Signs BP 111/74 (BP Location: Right Arm)   Pulse 76   Temp 97.6 F (36.4 C) (Oral)   Resp 18   Ht 5\' 9"  (1.753 m)   Wt 87 kg   SpO2 100%   BMI 28.32 kg/m   Physical Exam  Constitutional: He is oriented to person, place, and time. He appears well-developed and well-nourished. No distress.  IV zosyn currently being infused through right chest port  HENT:  Head: Normocephalic and atraumatic.  Right Ear: External ear normal.  Left Ear: External ear normal.  Nose: Nose normal.  Eyes: Right eye exhibits  no discharge. Left eye exhibits no discharge.  Neck: Neck supple.  Cardiovascular: Normal rate, regular rhythm and normal heart sounds.  Pulmonary/Chest: Effort normal and breath sounds normal. He has no wheezes. He has no rales.  Abdominal: Soft. There is no tenderness.  Abdominal surgical scars without obvious complication  Musculoskeletal: He exhibits no edema.  Neurological: He is alert and oriented to person, place, and time.  5/5 strength in all 4 extremities  Skin: Skin is warm and dry. He is not diaphoretic.  Nursing note and vitals reviewed.    ED Treatments / Results  Labs (all labs ordered are listed, but only abnormal results are displayed) Labs Reviewed  I-STAT CG4 LACTIC ACID, ED    EKG None  Radiology No results found.  Procedures Procedures (including critical care time)  Medications Ordered in ED Medications  sodium chloride 0.9 % bolus 1,000 mL (has no administration in time range)     Initial Impression / Assessment and Plan / ED Course  I have reviewed the triage vital signs and the nursing notes.  Pertinent labs & imaging results that were available during my care of the patient were reviewed by me and considered in my medical decision making (see chart for details).     Labs from 8:57 AM from his oncologist are reviewed.  Pertinent positives include WBC of 13.7, creatinine of 4.4, bicarbonate 12, BUN 82, and anion gap of 18.  The creatinine previous to this was from 2 weeks ago and was 1.85.  Urinalysis reviewed and positive for urinary tract infection.  Blood culture and urine culture were obtained in the oncology office prior to Zosyn being started per the nurse notes.  Patient is afebrile and not having urinary retention according to a bladder scan.  Lactate normal. He will need IV fluids and treatment with IV antibiotics.  He will be admitted to the hospitalist service.  Final Clinical Impressions(s) / ED Diagnoses   Final diagnoses:  Acute  kidney injury ([place])  Acute urinary tract infection    ED Discharge Orders    None       Sherwood Gambler, MD 10/08/17 1453

## 2017-10-08 NOTE — Progress Notes (Signed)
Symptoms Management Clinic Progress Note   Jonathan Marshall [id number] 1932/05/23 84 y.o.  Jonathan Marshall is managed by Jonathan Marshall  Actively treated with chemotherapy/immunotherapy: no   Assessment: Plan:    Weakness - Plan: CBC with Differential ([facility] Marshall Only), CMP (Jonathan Marshall only), Urinalysis, Complete w Microscopic, Magnesium, Urine Culture, Urinalysis, Complete w Microscopic, Culture, Blood, piperacillin-tazobactam (ZOSYN) IVPB 3.375 g, CANCELED: Culture, Blood  Anorexia - Plan: CBC with Differential ([facility] Marshall Only), CMP (Jonathan Marshall only), Urinalysis, Complete w Microscopic, Magnesium, Urine Culture, Urinalysis, Complete w Microscopic, CANCELED: Culture, Blood  Diarrhea, unspecified type - Plan: CBC with Differential (Jonathan Marshall Only), CMP (Jonathan Marshall only), Urinalysis, Complete w Microscopic, Magnesium  Rigors - Plan: Urine Culture, Urinalysis, Complete w Microscopic, Culture, Blood, piperacillin-tazobactam (ZOSYN) IVPB 3.375 g, CANCELED: Culture, Blood  Confusion - Plan: Urine Culture, Urinalysis, Complete w Microscopic, Culture, Blood, piperacillin-tazobactam (ZOSYN) IVPB 3.375 g, CANCELED: Culture, Blood  Acute renal failure, unspecified acute renal failure type ([place]) - Plan: 0.9 %  sodium chloride infusion   Weakness, anorexia, diarrhea, rigors, confusion, and acute renal failure: Patient was seen in clinic today.  His labs returned showing that he has a urinary tract infection.  A CBC returned with a WBC elevated at 13.7 with an [place] of 10.8.  Jonathan patient's chemistry panel returned with a creatinine of Marshall.Marshall.  Jonathan patient was begun on 1 L of normal saline and was dosed with Zosyn 3.375 g and was transferred to Jonathan emergency room for evaluation and management.  Please see After Visit Summary for patient specific instructions.  Future Appointments  Date Time Provider [place]  10/16/2017  1:00 PM Jonathan Marshall Jonathan None  10/16/2017   1:40 PM Jonathan Genera, MD [facility] None    Orders Placed This Encounter  Procedures  . Urine Culture  . Culture, Blood  . CBC with Differential ([place] Only)  . CMP ([place] only)  . Urinalysis, Complete w Microscopic  . Magnesium  . Urinalysis, Complete w Microscopic       Subjective:   Patient ID:  Jonathan Marshall is a 84 y.o. (DOB 07/30/1932) male.  Chief Complaint:  Chief Complaint  Patient presents with  . Fatigue    HPI Jonathan Marshall is an 84 year old male with a history of a diffuse large B-cell lymphoma who is managed by Jonathan Marshall.  He is status post Marshall cycles of R-mini CHOP.  A PET CT scan completed on 07/28/2017 showed a significant response to therapy.  A large retroperitoneal mass was significantly smaller with minimal residual activity.  There was no evidence of disease progression.  Additionally Jonathan patient has a history of urothelial cell carcinoma.  He was last seen by Jonathan Marshall on 09/04/2017 and was referred back to urology.  He was taken for a laparoscopic nephroureterectomy under Jonathan care of Jonathan Marshall on 09/24/2017 and was discharged on 09/28/2017.  Jonathan patient's wife contact her office yesterday stating that Jonathan patient was declining since his surgery and was having vomiting, liquid stools, weakness, and confusion.  She also reported Jonathan patient had fallen 3 times since Monday.  They were instructed to contact urology given his recent discharge and surgery.  According to patient's wife she had not heard back from urology as of yesterday afternoon.  She contacted her office again at around 330 and was instructed to present to Jonathan emergency room.  Jonathan patient and her husband declined to go to  Jonathan ER. [name] presents to clinic today with continued symptoms.  Medications: I have reviewed Jonathan patient's current medications.  Allergies: No Known Allergies  Past Medical History:  Diagnosis Date  . BPH (benign prostatic hyperplasia)    . Cancer Jonathan Marshall) 1993   Colon surgery and chemo , 2-19 left ureteral camcer last chemo tx 08-27-17  . DDD (degenerative disc disease), lumbar    L4-L5  . Depression   . Early stage glaucoma    both eyes  . Hearing loss   . Hematuria, gross   . History of adenomatous polyp of colon   . History of carpal tunnel syndrome    left  . History of colon cancer    dx 1993  s/p  hemicolectomy (malignant tumor x2)  and chemo therapy completed 1993--- no recurrence per pt  . History of shingles   . HOH (hard of hearing)    right ear  . Hyperlipidemia   . Insomnia     Past Surgical History:  Procedure Laterality Date  . APPENDECTOMY    . CARPAL TUNNEL RELEASE Left 08/13/2013   Procedure: CARPAL TUNNEL RELEASE LEFT WRIST;  Surgeon: Jonathan Rack., MD;  Location: [facility];  Service: Orthopedics;  Laterality: Left;  . COLONOSCOPY    . CYSTOSCOPY W/ RETROGRADES Bilateral 04/03/2017   Procedure: CYSTOSCOPY WITH RETROGRADE PYELOGRAM, LEFT URETEROSCOPY, LEFT STENT PLACEMENT;  Surgeon: Jonathan Gallo, MD;  Location: [facility];  Service: Urology;  Laterality: Bilateral;  . CYSTOSCOPY W/ URETERAL STENT PLACEMENT Left 04/23/2017   Procedure: CYSTOSCOPY WITH STENT REPLACEMENT;  Surgeon: Jonathan Frock, MD;  Location: [facility];  Service: Urology;  Laterality: Left;  . CYSTOSCOPY WITH FULGERATION Left 04/03/2017   Procedure: LEFT URETERAL BIOPSY;  Surgeon: Jonathan Gallo, MD;  Location: [facility];  Service: Urology;  Laterality: Left;  . CYSTOSCOPY/RETROGRADE/URETEROSCOPY Left 04/23/2017   Procedure: CYSTOSCOPY/RETROGRADE/URETEROSCOPY, LEFT  URETEROSCOPY WITY  BIOPSY;  Surgeon: Jonathan Frock, MD;  Location: [facility];  Service: Urology;  Laterality: Left;  . HEMICOLECTOMY  1993   malignant tumor x2 , colon cancer and Appendectomy  . INGUINAL HERNIA REPAIR Right 2003  . IR FLUORO GUIDE PORT INSERTION RIGHT  [place]/06/2017    . IR US GUIDE VASC ACCESS RIGHT  [place]arshall/06/2017  . MASTOIDECTOMY Right 2015  . ROBOT ASSITED LAPAROSCOPIC NEPHROURETERECTOMY Left 09/24/2017   Procedure: XI ROBOT ASSITED LAPAROSCOPIC NEPHROURETERECTOMY;  Surgeon: [facility] Frock, MD;  Location: [facility];  Service: Urology;  Laterality: Left;  . ROBOTIC ASSISTED LAPAROSCOPIC LYSIS OF ADHESION  09/24/2017   Procedure: XI ROBOTIC ASSISTED LAPAROSCOPIC EXTENSIVE LYSIS OF ADHESION;  Surgeon: Jonathan Frock, MD;  Location[facility];  Service: Urology;;  . Lonell Face  last one 06/ 2018  . TRANSURETHRAL RESECTION OF PROSTATE N/A 09/02/2016   Procedure: TRANSURETHRAL RESECTION OF Jonathan PROSTATE (TURP);  Surgeon: Jonathan Gallo, MD;  [facility];  Service: Urology;  Laterality: N/A;    No family history on file.  Social History   Socioeconomic History  . Marital status: Married    Spouse name: Not on file  . Number of children: Not on file  . Years of education: Not on file  . Highest education level: Not on file  Occupational History  . Not on file  Social Needs  . Financial resource strain: Not on file  . Food insecurity:    Worry: Not on file    Inability: Not on file  . Transportation  needs:    Medical: Not on file    Non-medical: Not on file  Tobacco Use  . Smoking status: Never Smoker  . Smokeless tobacco: Never Used  Substance and Sexual Activity  . Alcohol use: Yes    Alcohol/week: 1.0 standard drinks    Types: 1 Glasses of wine per week    Comment: rare  . Drug use: Never  . Sexual activity: Not Currently  Lifestyle  . Physical activity:    Days per week: Not on file    Minutes per session: Not on file  . Stress: Not on file  Relationships  . Social connections:    Talks on phone: Not on file    Gets together: Not on file    Attends religious service: Not on file    Active member of club or organization: Not on file    Attends meetings of clubs or organizations: Not on file    Relationship  status: Not on file  . Intimate partner violence:    Fear of current or ex partner: Not on file    Emotionally abused: Not on file    Physically abused: Not on file    Forced sexual activity: Not on file  Other Topics Concern  . Not on file  Social History Narrative  . Not on file    Past Medical History, Surgical history, Social history, and Family history were reviewed and updated as appropriate.   Please see review of systems for further details on Jonathan patient's review from today.   Review of Systems:  Review of Systems  Constitutional: Positive for activity change, appetite change and chills. Negative for diaphoresis and fever.  Respiratory: Negative for cough and shortness of breath.   Cardiovascular: Negative for chest pain, palpitations and leg swelling.  Gastrointestinal: Positive for diarrhea, nausea and vomiting. Negative for abdominal pain and constipation.  Genitourinary: Positive for dysuria. Negative for difficulty urinating, flank pain, frequency, hematuria and urgency.  Skin: Negative for rash.  Neurological: Positive for dizziness. Negative for headaches.       Questionable absence seizures  Psychiatric/Behavioral: Positive for confusion.    Objective:   Physical Exam:  BP 100/70 (BP Location: Right Arm, Patient Position: Sitting) Comment: manual  Pulse 81   Temp 97.9 F (36.6 C) (Axillary) Comment: unable to get oral temp  Resp (!) 22   SpO2 100%  ECOG: 2  Physical Exam  Constitutional:  Jonathan patient is shivering.  HENT:  Head: Normocephalic and atraumatic.  Mucous membranes are dry.  Neck: Normal range of motion. Neck supple.  Cardiovascular: Normal rate, regular rhythm and normal heart sounds. Exam reveals no gallop and no friction rub.  No murmur heard. Pulmonary/Chest: Effort normal and breath sounds normal. No respiratory distress. He has no wheezes. He has no rales.  Abdominal: Bowel sounds are normal. He exhibits no distension and no mass.  There is no tenderness. There is no rebound and no guarding.  Lymphadenopathy:    He has no cervical adenopathy.  Skin: Skin is warm and dry. No rash noted. He is not diaphoretic. No erythema.  Increased tenting of Jonathan bilateral dorsal hands.    Lab Review:     Component Value Date/Time   NA 136 10/08/2017 0857   K 3.7 10/08/2017 0857   CL 106 10/08/2017 0857   CO2 12 (L) 10/08/2017 0857   GLUCOSE 107 (H) 10/08/2017 0857   BUN 82 (H) 10/08/2017 0857   CREATININE Marshall.07 (H) 10/08/2017 1540  CREATININE Marshall.40 (HH) 10/08/2017 0857   CALCIUM 10.0 10/08/2017 0857   PROT 7.3 10/08/2017 0857   ALBUMIN 3.2 (L) 10/08/2017 0857   AST 33 10/08/2017 0857   ALT 39 10/08/2017 0857   ALKPHOS 93 10/08/2017 0857   BILITOT 0.3 10/08/2017 0857   GFRNONAA 12 (L) 10/08/2017 1540   GFRNONAA 11 (L) 10/08/2017 0857   GFRAA 14 (L) 10/08/2017 1540   GFRAA 13 (L) 10/08/2017 0857       Component Value Date/Time   WBC 11.0 (H) 10/08/2017 1540   RBC 2.76 (L) 10/08/2017 1540   HGB 8.5 (L) 10/08/2017 1540   HGB 10.1 (L) 10/08/2017 0857   HCT 25.Marshall (L) 10/08/2017 1540   PLT 450 (H) 10/08/2017 1540   PLT 415 (H) 10/08/2017 0857   MCV 92.0 10/08/2017 1540   MCH 30.8 10/08/2017 1540   MCHC 33.5 10/08/2017 1540   RDW 14.2 10/08/2017 1540   LYMPHSABS 1.6 10/08/2017 0857   MONOABS 0.9 10/08/2017 0857   EOSABS 0.8 (H) 10/08/2017 0857   BASOSABS 0.0 10/08/2017 0857   -------------------------------  Imaging from last 24 hours (if applicable):  Radiology interpretation: No results found.      This case was discussed with Jonathan Marshall. He expresses agreement with my management of this patient.

## 2017-10-08 NOTE — ED Notes (Signed)
Pt requesting water. Per MD:Pt may drink

## 2017-10-08 NOTE — ED Triage Notes (Signed)
Pt is coming from the cancer center. Pt had a Urinalysis done and it was positive for leukocytes. Pt's creatinine is 4.4 and WBC was 13.7. Pt has a hx of urothelial carcinoma and lymphoma. Port is in place and accessed.  Blood cultures already sent down by cancer center and antibiotics started. VSS per cancer center RN from cancer center reports not to do a rectal temp due to the area of radiation.

## 2017-10-08 NOTE — Progress Notes (Signed)
Pt presents to [facility] today with wife.  Pt is shaking w/axially temp of 97.9 (cannot get oral temp).  Per wife pt has not been eating or drinking, has been experiencing loose stools and N/V for several days.  Recent surgery.  Currently A&Ox4 but per wife pt intermittently confused with occasional 'times where he just stops talking and shakes a lot, and he won't respond to me for a few minutes'.  Pt cannot stand/ambulate without severe dizziness.  Multiple recent falls with possible head trauma.  No unilat weakness or facial droop noted.  Pa Van aware of findings and of VS.  One set of blood cultures drawn from port.  Unable to draw second set peripherally.  PA Lucianne Lei aware.  Cultures drawn before abx started.

## 2017-10-08 NOTE — ED Notes (Signed)
RN bladder scanned pt and 62ml's were noted in bladder. Dr. Regenia Skeeter present during bladder scan and aware of results

## 2017-10-08 NOTE — Progress Notes (Signed)
Received report on Jonathan Marshall form Oncologist  In the ED

## 2017-10-08 NOTE — ED Notes (Signed)
Lab called and asked to add culture onto existing urine in lab due to the administration of abx. Lab to obtain culture from existing specimen.

## 2017-10-08 NOTE — H&P (Signed)
History and Physical    Jonathan Marshall TXM:[id number] DOB: June 14, 1932 DOA: 10/08/2017  PCP: Lorene Dy, MD   Patient coming from: Home  Chief Complaint: Weakness, Lethargy, and N/V   HPI: Jonathan Marshall is a 84 y.o. 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  Patient recently underwent a left nephroureterectomy on 09/24/2017 and since then he has been feeling more fatigued and weak.  Recently had his Foley catheter removed on Monday by urology.  Patient states that he has had some nausea vomiting ever since his surgery and states that his appetite is been extremely poor because he vomits his food up almost every several time.  He also states that he has not been drinking much fluids.  Patient felt dizzy upon standing and went to go see his oncologist today who checked some blood work and a urine analysis which showed likely UTI.  They obtained a blood culture and started the patient on empiric IV Zosyn.  Because his kidney function showed a creatinine of 4.4 he was directed to the emergency room for further evaluation.  [facility] was called to admit this patient for acute kidney injury on chronic kidney disease along with a suspected urinary tract infection  ED Course: Had a liter of normal saline bolused and had a repeat blood culture and urine culture added on.  Review of Systems: As per HPI otherwise 10 point review of systems negative.   Past Medical History:  Diagnosis Date  . BPH (benign prostatic hyperplasia)   . Cancer [facility]) 1993   Colon surgery and chemo , 2-19 left ureteral camcer last chemo tx 08-27-17  . DDD (degenerative disc disease), lumbar    L4-L5  . Depression   . Early stage glaucoma    both eyes  . Hearing  loss   . Hematuria, gross   . History of adenomatous polyp of colon   . History of carpal tunnel syndrome    left  . History of colon cancer    dx 1993  s/p  hemicolectomy (malignant tumor x2)  and chemo therapy completed 1993--- no recurrence per pt  . History of shingles   . HOH (hard of hearing)    right ear  . Hyperlipidemia   . Insomnia    Past Surgical History:  Procedure Laterality Date  . APPENDECTOMY    . CARPAL TUNNEL RELEASE Left 08/13/2013   Procedure: CARPAL TUNNEL RELEASE LEFT WRIST;  Surgeon: Yvette Rack., MD;  Location: [place];  Service: Orthopedics;  Laterality: Left;  . COLONOSCOPY    . CYSTOSCOPY W/ RETROGRADES Bilateral 04/03/2017   Procedure: CYSTOSCOPY WITH RETROGRADE PYELOGRAM, LEFT URETEROSCOPY, LEFT STENT PLACEMENT;  Surgeon: Franchot Gallo, MD;  Location[facility];  Service: Urology;  Laterality: Bilateral;  . CYSTOSCOPY W/ URETERAL STENT PLACEMENT Left 04/23/2017   Procedure: CYSTOSCOPY WITH STENT REPLACEMENT;  Surgeon: Alexis Frock, MD;  Location: [facility];  Service: Urology;  Laterality: Left;  . CYSTOSCOPY WITH FULGERATION Left 04/03/2017   Procedure: LEFT URETERAL BIOPSY;  Surgeon: Franchot Gallo, MD;  Location: [facility];  Service: Urology;  Laterality: Left;  . CYSTOSCOPY/RETROGRADE/URETEROSCOPY Left 04/23/2017   Procedure: CYSTOSCOPY/RETROGRADE/URETEROSCOPY, LEFT  URETEROSCOPY WITY  BIOPSY;  Surgeon: Alexis Frock, MD;  Location: [place];  Service: Urology;  Laterality: Left;  . HEMICOLECTOMY  1993   malignant tumor x2 , colon cancer and Appendectomy  . INGUINAL HERNIA REPAIR Right 2003  . IR FLUORO GUIDE PORT INSERTION RIGHT  05/30/2017  . IR US GUIDE VASC ACCESS RIGHT  05/30/2017  . MASTOIDECTOMY Right 2015  . ROBOT ASSITED LAPAROSCOPIC NEPHROURETERECTOMY Left 09/24/2017   Procedure: XI ROBOT ASSITED LAPAROSCOPIC NEPHROURETERECTOMY;  Surgeon: Alexis Frock, MD;  Location: [facility];  Service: Urology;  Laterality: Left;  . ROBOTIC ASSISTED LAPAROSCOPIC LYSIS OF ADHESION  09/24/2017   Procedure: XI ROBOTIC ASSISTED LAPAROSCOPIC EXTENSIVE LYSIS OF ADHESION;  Surgeon: Alexis Frock, MD;  Location: [facility];  Service: Urology;;  . Lonell Face  last one 06/ 2018  . TRANSURETHRAL RESECTION OF PROSTATE N/A 09/02/2016   Procedure: TRANSURETHRAL RESECTION OF THE PROSTATE (TURP);  Surgeon: Franchot Gallo, MD;  Location: [facility];  Service: Urology;  Laterality: N/A;   SOCIAL HISTORY  reports that he has never smoked. He has never used smokeless tobacco. He reports that he drinks about 1.0 standard drinks of alcohol per week. He reports that he does not use drugs.  ALLERGIES No Known Allergies  FAMILY HISTORY Family History Reviewed and not pertinent to presenting complaint   Prior to Admission medications   Medication Sig Start Date End Date Taking? Authorizing Provider  cefpodoxime (VANTIN) 200 MG tablet Take 200 mg by mouth 2 (two) times daily. Started Thursday 09-18-17 in pm x 7 days    [provider]  clotrimazole (LOTRIMIN) 1 % cream Apply 1 application topically 2 (two) times daily as needed (for foot rash).    [provider]  diphenhydramine-acetaminophen (TYLENOL PM) 25-500 MG TABS tablet Take 2 tablets by mouth at bedtime.     [provider]  fluticasone (FLONASE) 50 MCG/ACT nasal spray Place 1 spray into both nostrils daily as needed (for allergies during SPRING months ONLY).     [provider]  HYDROcodone-acetaminophen (NORCO/VICODIN) 5-325 MG tablet Take 1 tablet by mouth every 4 (four) hours as needed for moderate pain. 09/26/17 09/26/18  Clydene Laming, Case M, MD  latanoprost (XALATAN) 0.005 % ophthalmic solution Place 1 drop into both eyes at bedtime.    [provider]  lidocaine-prilocaine (EMLA) cream Apply 1 application topically as needed. For use one hour prior to port  access. Place quarter-size amount over port and cover with press-n-seal. 06/03/17   Brunetta Genera, MD  LORazepam (ATIVAN) 0.5 MG tablet Take 1 tablet (0.5 mg total) by mouth every 6 (six) hours as needed (Nausea or vomiting). 06/03/17   Brunetta Genera, MD  lovastatin (MEVACOR) 40 MG tablet Take 40 mg by mouth every evening.    [provider]  ondansetron (ZOFRAN) 8 MG tablet Take 1 tablet (8 mg total) by mouth 2 (two) times daily as needed for refractory nausea / vomiting. Start on day 3 after cytoxan chemorx Patient not taking: Reported on 09/15/2017 06/03/17   Brunetta Genera, MD  predniSONE (DELTASONE) 20 MG tablet Take 3 tablets (60 mg total) by mouth daily. Take on days 1-5 of chemotherapy. Patient not taking: Reported on 09/15/2017 06/03/17   Brunetta Genera, MD  prochlorperazine (COMPAZINE) 10 MG tablet Take 1 tablet (10 mg total) by mouth every 6 (six) hours as needed (Nausea or vomiting). Patient not taking: Reported on 09/15/2017 06/03/17   Brunetta Genera, MD  sertraline (ZOLOFT) 25 MG tablet Take 25 mg by mouth every morning.  [provider]   Physical Exam: Vitals:   10/08/17 1123 10/08/17 1125 10/08/17 1329 10/08/17 1424  BP: 111/74   115/61  Pulse: 76  72 82  Resp: 18   16  Temp: 97.6 F (36.4 C)   (!) 97.5 F (36.4 C)  TempSrc: Oral   Oral  SpO2: 100%  100% 100%  Weight:  87 kg    Height:  5\' 9"  (1.753 m)     Constitutional: Pleasant elderly Caucasian male in NAD and appears calm  Eyes: Lids and conjunctivae normal, sclerae anicteric  ENMT: External Ears, Nose appear normal. Grossly normal hearing. Mucous membranes are dry Neck: Appears normal, supple, no cervical masses, normal ROM, no appreciable thyromegaly, no JVD Respiratory: Diminished to auscultation bilaterally, no wheezing, rales, rhonchi or crackles. Normal respiratory effort and patient is not tachypenic. No accessory muscle use.  Cardiovascular: RRR, Slight murmur. S1 and  S2 auscultated. No extremity edema.  Abdomen: Soft, non-tender, non-distended. No masses palpated. No appreciable hepatosplenomegaly. Bowel sounds positive x4. Incision marks from recent surgery  GU: Deferred. Musculoskeletal: No clubbing / cyanosis of digits/nails. No joint deformity upper and lower extremities. Good ROM, no contractures. Normal strength and muscle tone.  Skin: No rashes, lesions, ulcers on a limited skin evaluation. No induration; Warm and dry.  Neurologic: CN 2-12 grossly intact with no focal deficits. Romberg sign and cerebellar reflexes not assessed.  Psychiatric: Normal judgment and insight. Alert and oriented x 3. Normal mood and appropriate affect.   Labs on Admission: I have personally reviewed following labs and imaging studies  CBC: Recent Labs  Lab 10/08/17 0857  WBC 13.7*  NEUTROABS 10.4*  HGB 10.1*  HCT 30.7*  MCV 92.5  PLT 093*   Basic Metabolic Panel: Recent Labs  Lab 10/08/17 0857  NA 136  K 3.7  CL 106  CO2 12*  GLUCOSE 107*  BUN 82*  CREATININE 4.40*  CALCIUM 10.0  MG 2.4   GFR: Estimated Creatinine Clearance: 13.9 mL/min (A) (by C-G formula based on SCr of 4.4 mg/dL [facility])). Liver Function Tests: Recent Labs  Lab 10/08/17 0857  AST 33  ALT 39  ALKPHOS 93  BILITOT 0.3  PROT 7.3  ALBUMIN 3.2*   No results for input(s): LIPASE, AMYLASE in the last 168 hours. No results for input(s): AMMONIA in the last 168 hours. Coagulation Profile: No results for input(s): INR, PROTIME in the last 168 hours. Cardiac Enzymes: No results for input(s): CKTOTAL, CKMB, CKMBINDEX, TROPONINI in the last 168 hours. BNP (last 3 results) No results for input(s): PROBNP in the last 8760 hours. HbA1C: No results for input(s): HGBA1C in the last 72 hours. CBG: No results for input(s): GLUCAP in the last 168 hours. Lipid Profile: No results for input(s): CHOL, HDL, LDLCALC, TRIG, CHOLHDL, LDLDIRECT in the last 72 hours. Thyroid Function Tests: No  results for input(s): TSH, T4TOTAL, FREET4, T3FREE, THYROIDAB in the last 72 hours. Anemia Panel: No results for input(s): VITAMINB12, FOLATE, FERRITIN, TIBC, IRON, RETICCTPCT in the last 72 hours. Urine analysis:    Component Value Date/Time   COLORURINE YELLOW 10/08/2017 0858   APPEARANCEUR CLOUDY (A) 10/08/2017 0858   LABSPEC 1.016 10/08/2017 0858   PHURINE 5.0 10/08/2017 0858   GLUCOSEU NEGATIVE 10/08/2017 0858   HGBUR MODERATE (A) 10/08/2017 0858   BILIRUBINUR NEGATIVE 10/08/2017 0858   KETONESUR NEGATIVE 10/08/2017 0858   PROTEINUR 100 (A) 10/08/2017 0858   NITRITE NEGATIVE 10/08/2017 0858   LEUKOCYTESUR LARGE (A) 10/08/2017 0858   Sepsis  Labs: !!!!!!!!!!!!!!!!!!!!!!!!!!!!!!!!!!!!!!!!!!!! @LABRCNTIP (procalcitonin:4,lacticidven:4) ) Recent Results (from the past 240 hour(s))  Culture, Blood     Status: None (Preliminary result)   Collection Time: 10/08/17 10:30 AM  Result Value Ref Range Status   Specimen Description BLOOD PORTA CATH  Final   Special Requests   Final    BOTTLES DRAWN AEROBIC AND ANAEROBIC Blood Culture adequate volume   Culture PENDING  Incomplete   Report Status PENDING  Incomplete    Radiological Exams on Admission: No results found.  EKG: No EKG done on arrival to the ED so one will be ordered currently  Assessment/Plan Active Problems:   Enlarged prostate with urinary obstruction   Urothelial carcinoma (HCC)   Acute kidney injury superimposed on chronic kidney disease ([place])   Complicated UTI (urinary tract infection)   Generalized weakness   Normocytic anemia   Glaucoma   HLD (hyperlipidemia)   Depression   Acute kidney injury on chronic kidney disease stage III -To inpatient MedSurg -Avoid nephrotoxic medications possible -Last BUN/creatinine on 09/24/2017 was 20/1.5 and today it is 82/4.40 -IV fluid hydration with normal saline -Check renal ultrasound to rule out obstruction -Likely in the setting of poor p.o. intake and dehydration and  suspected infection -Continue monitor and trend and repeat CBC in a.m.  Complicated UTI -Recently had Foley catheter removed -Blood Culture and urine culture sent -Urinalysis showed cloudy color appearance, moderate ketones, large leukocytes, negative nitrites, many bacteria, greater than 50 WBCs, greater than 50 RBCs per high-power field -IV Zosyn empirically at the office and will stop and start patient on IV ceftriaxone -Follow-up blood cultures and sensitivities that were obtained -WBC on admission was 13.7 -Repeat CBC in the a.m.  Generalized weakness and lethargy secondary to infection and acute kidney injury along with poor po Intake and N/V -continue IV fluid hydration as above -PT/OT to evaluate and treat -Supportive Care and Antiemetics  High Anion Gap Metabolic Acidosis -Lactic acid levels 1.17 on admission however bicarb was 12 -Likely in the setting of nausea, vomiting, poor p.o. Intake, and Infection -Continue with IV fluid hydration -Repeat CMP in the a.m.  Thrombocytosis -Likely reactive in the setting of infection; was 415 -Continue to monitor and trend platelet count by daily CBC's  Normocytic Anemia -Hemoglobin appears at baseline -Continue monitor for signs and symptoms of bleeding -Continue to trend and repeat CBC in a.m.   Glaucoma -Continue with latanoprost 0.005% abdominal solution  Hyperlipidemia -Continue with lovastatin substitution with pravastatin 40 mg p.o. Nightly  History of colon cancer, Diffuse Large B-Cell Lymphoma, and ureteral cancer status post left nephroureterectomy -Follow Up with oncology and urology in the outpatient setting  Depression -Continue with home Sertraline  DVT prophylaxis: Heparin 5,000 units sq q8h Code Status: DO NOT RESUSCITATE Family Communication: No family present at bedside  Disposition Plan: Anticipate discharge home environment after PT/OT evaluation Consults called: None Admission status: Inpatient  Med-Surge  Severity of Illness: The appropriate patient status for this patient is INPATIENT. Inpatient status is judged to be reasonable and necessary in order to provide the required intensity of service to ensure the patient's safety. The patient's presenting symptoms, physical exam findings, and initial radiographic and laboratory data in the context of their chronic comorbidities is felt to place them at high risk for further clinical deterioration. Furthermore, it is not anticipated that the patient will be medically stable for discharge from the hospital within 2 midnights of admission. The following factors support the patient status of inpatient.   " The  patient's presenting symptoms include Weakness, Lethargy, N/V. " The worrisome physical exam findings include dry Mucous Membranes " The initial radiographic and laboratory data are worrisome because of AKI and UTI. " The chronic co-morbidities include Cancer, HLD, Depression.  * I certify that at the point of admission it is my clinical judgment that the patient will require inpatient hospital care spanning beyond 2 midnights from the point of admission due to high intensity of service, high risk for further deterioration and high frequency of surveillance required.Kerney Elbe, D.O. [facility] Pager [phone]  If 7PM-7AM, please contact night-coverage [email or web address] Password [facility]  10/08/2017, 2:29 PM

## 2017-10-08 NOTE — ED Notes (Addendum)
Lab called to inquire about urine culture in lab. Per Lab: No urine culture sent down.

## 2017-10-09 DIAGNOSIS — N390 Urinary tract infection, site not specified: Secondary | ICD-10-CM

## 2017-10-09 DIAGNOSIS — N179 Acute kidney failure, unspecified: Secondary | ICD-10-CM

## 2017-10-09 LAB — PHOSPHORUS: PHOSPHORUS: 4.9 mg/dL — AB (ref 2.5–4.6)

## 2017-10-09 LAB — CBC WITH DIFFERENTIAL/PLATELET
BASOS PCT: 0 %
Basophils Absolute: 0 10*3/uL (ref 0.0–0.1)
Eosinophils Absolute: 0.6 10*3/uL (ref 0.0–0.7)
Eosinophils Relative: 6 %
HEMATOCRIT: 25.4 % — AB (ref 39.0–52.0)
Hemoglobin: 8.5 g/dL — ABNORMAL LOW (ref 13.0–17.0)
LYMPHS PCT: 13 %
Lymphs Abs: 1.1 10*3/uL (ref 0.7–4.0)
MCH: 30.9 pg (ref 26.0–34.0)
MCHC: 33.5 g/dL (ref 30.0–36.0)
MCV: 92.4 fL (ref 78.0–100.0)
MONO ABS: 0.6 10*3/uL (ref 0.1–1.0)
MONOS PCT: 7 %
NEUTROS ABS: 6.5 10*3/uL (ref 1.7–7.7)
Neutrophils Relative %: 74 %
Platelets: 432 10*3/uL — ABNORMAL HIGH (ref 150–400)
RBC: 2.75 MIL/uL — ABNORMAL LOW (ref 4.22–5.81)
RDW: 14.1 % (ref 11.5–15.5)
WBC: 8.8 10*3/uL (ref 4.0–10.5)

## 2017-10-09 LAB — COMPREHENSIVE METABOLIC PANEL
ALBUMIN: 2.9 g/dL — AB (ref 3.5–5.0)
ALK PHOS: 65 U/L (ref 38–126)
ALT: 28 U/L (ref 0–44)
ANION GAP: 9 (ref 5–15)
AST: 21 U/L (ref 15–41)
BUN: 70 mg/dL — ABNORMAL HIGH (ref 8–23)
CALCIUM: 8.9 mg/dL (ref 8.9–10.3)
CHLORIDE: 115 mmol/L — AB (ref 98–111)
CO2: 15 mmol/L — AB (ref 22–32)
Creatinine, Ser: 3.61 mg/dL — ABNORMAL HIGH (ref 0.61–1.24)
GFR calc non Af Amer: 14 mL/min — ABNORMAL LOW (ref >60)
GFR, EST AFRICAN AMERICAN: 17 mL/min — AB (ref >60)
GLUCOSE: 93 mg/dL (ref 70–99)
POTASSIUM: 3.3 mmol/L — AB (ref 3.5–5.1)
SODIUM: 139 mmol/L (ref 135–145)
Total Bilirubin: 0.3 mg/dL (ref 0.3–1.2)
Total Protein: 6.1 g/dL — ABNORMAL LOW (ref 6.5–8.1)

## 2017-10-09 LAB — GLUCOSE, CAPILLARY: GLUCOSE-CAPILLARY: 115 mg/dL — AB (ref 70–99)

## 2017-10-09 LAB — MAGNESIUM: Magnesium: 2.2 mg/dL (ref 1.7–2.4)

## 2017-10-09 MED ORDER — TRAZODONE HCL 50 MG PO TABS
50.0000 mg | ORAL_TABLET | Freq: Every day | ORAL | Status: DC
  Administered 2017-10-09 – 2017-10-10 (×2): 50 mg via ORAL
  Filled 2017-10-09 (×2): qty 1

## 2017-10-09 MED ORDER — BOOST / RESOURCE BREEZE PO LIQD CUSTOM
1.0000 | Freq: Three times a day (TID) | ORAL | Status: DC
  Administered 2017-10-09 – 2017-10-11 (×2): 1 via ORAL
  Administered 2017-10-11: 20:00:00 via ORAL

## 2017-10-09 NOTE — Progress Notes (Signed)
PROGRESS NOTE    Jonathan Marshall  UGQ:[id number] DOB: May 06, 1932 DOA: 10/08/2017 PCP: Lorene Dy, MD  Brief Narrative:85 y.o. 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  Patient recently underwent a left nephroureterectomy on 09/24/2017 and since then he has been feeling more fatigued and weak.  Recently had his Foley catheter removed on Monday by urology.  Patient states that he has had some nausea vomiting ever since his surgery and states that his appetite is been extremely poor because he vomits his food up almost every several time.  He also states that he has not been drinking much fluids.  Patient felt dizzy upon standing and went to go see his oncologist today who checked some blood work and a urine analysis which showed likely UTI.  They obtained a blood culture and started the patient on empiric IV Zosyn.  Because his kidney function showed a creatinine of 4.4 he was directed to the emergency room for further evaluation.  [facility] was called to admit this patient for acute kidney injury on chronic kidney disease along with a suspected urinary tract infection  ED Course: Had a liter of normal saline bolused and had a repeat blood culture and urine culture added on. Assessment & Plan:   Active Problems:   Enlarged prostate with urinary obstruction   Urothelial carcinoma (HCC)   Acute kidney injury superimposed on chronic kidney disease (HCC)   Complicated UTI (urinary tract infection)   Generalized weakness   Normocytic anemia   Glaucoma   HLD (hyperlipidemia)   Depression   1] AKI on CKD stage III renal functions getting better with IV hydration though still high.  Patient has solitary kidney.  Continue IV hydration.   Follow-up renal ultrasound.  Patient has very poor p.o. intake with decreased appetite and also has chronic diarrhea.  Patient reports having recent surgery for urethral cancer and nephrectomy July 31 of 2019.  He will appear to be dehydrated and needs IV hydration.  He reports dizziness when standing up or walking.  Patient reports generalized weakness and lethargy secondary to nausea vomiting and diarrhea.  He reports part of his colon was removed which caused him to have diarrhea.  Creatinine today is 3.6 which is down from 4.4 yesterday.  2] complicated UTI status post recent catheter placement and removal.  Continue IV ceftriaxone.  Follow-up urine culture.  3] normocytic anemia drop in hemoglobin may have had some hemodilution with IV fluids.  Follow tomorrow.  4] insomnia patient requesting something to sleep at night.  Will start trazodone 50 mg nightly.  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  6] depression continue sertraline.  7] hypokalemia repleted.  DVT prophylaxis: Heparin Code Status: DO NOT RESUSCITATE Family Communication no family available Disposition Plan TBD patient still requires IV hydration appears to be dehydrated with acute renal kidney injury.  In addition to that he has solitary kidney from recent urethral cancer and left nephroureterectomy. Consultants: None   Procedures: None Antimicrobials: Rocephin Subjective: Resting in bed very pleasant young man in no acute distress complaining of lightheadedness when standing up.   Objective: Vitals:   10/08/17 1424 10/08/17 2119 10/09/17 0355 10/09/17 0534  BP: 115/61  126/73  112/63  Pulse: 82 73  76  Resp: 16 14  16   Temp: (!) 97.5 F (36.4 C) 97.7 F (36.5 C)  98 F (36.7 C)  TempSrc: Oral Oral  Oral  SpO2: 100% 100%  100%  Weight: 87.2 kg  86.8 kg   Height: 5\' 9"  (1.753 m)        Intake/Output Summary (Last 24 hours) at 10/09/2017 1216 Last data filed at 10/09/2017 0343 Gross per 24 hour  Intake 1554.23 ml  Output -  Net 1554.23 ml   Filed Weights   10/08/17 1125 10/08/17 1424 10/09/17 0355  Weight: 87 kg 87.2 kg 86.8 kg    Examination: Oral mucosa dry. General exam: Appears calm and comfortable  Respiratory system: Clear to auscultation. Respiratory effort normal. Cardiovascular system: S1 & S2 heard, RRR. No JVD, murmurs, rubs, gallops or clicks. No pedal edema. Gastrointestinal system: Abdomen is nondistended, soft and nontender. No organomegaly or masses felt. Normal bowel sounds heard. Central nervous system: Alert and oriented. No focal neurological deficits. Extremities: Symmetric 5 x 5 power. Skin: No rashes, lesions or ulcers Psychiatry: Judgement and insight appear normal. Mood & affect appropriate.     Data Reviewed: I have personally reviewed following labs and imaging studies  CBC: Recent Labs  Lab 10/08/17 0857 10/08/17 1540 10/09/17 0755  WBC 13.7* 11.0* 8.8  NEUTROABS 10.4*  --  6.5  HGB 10.1* 8.5* 8.5*  HCT 30.7* 25.4* 25.4*  MCV 92.5 92.0 92.4  PLT 415* 450* 902*   Basic Metabolic Panel: Recent Labs  Lab 10/08/17 0857 10/08/17 1540 10/09/17 0755  NA 136  --  139  K 3.7  --  3.3*  CL 106  --  115*  CO2 12*  --  15*  GLUCOSE 107*  --  93  BUN 82*  --  70*  CREATININE 4.40* 4.07* 3.61*  CALCIUM 10.0  --  8.9  MG 2.4 2.3 2.2  PHOS  --  5.2* 4.9*   GFR: Estimated Creatinine Clearance: 16.9 mL/min (A) (by C-G formula based on SCr of 3.61 mg/dL (H)). Liver Function Tests: Recent Labs  Lab 10/08/17 0857 10/09/17 0755  AST 33 21  ALT 39 28  ALKPHOS 93 65  BILITOT 0.3 0.3  PROT 7.3 6.1*  ALBUMIN 3.2* 2.9*   No results for input(s): LIPASE, AMYLASE in the last 168 hours. No results for input(s): AMMONIA in the last 168 hours. Coagulation Profile: No results for input(s): INR, PROTIME in the last 168  hours. Cardiac Enzymes: No results for input(s): CKTOTAL, CKMB, CKMBINDEX, TROPONINI in the last 168 hours. BNP (last 3 results) No results for input(s): PROBNP in the last 8760 hours. HbA1C: No results for input(s): HGBA1C in the last 72 hours. CBG: Recent Labs  Lab 10/09/17 0722  GLUCAP 115*   Lipid Profile: No results for input(s): CHOL, HDL, LDLCALC, TRIG, CHOLHDL, LDLDIRECT in the last 72 hours. Thyroid Function Tests: Recent Labs    10/08/17 1540  TSH 0.529   Anemia Panel: No results for input(s): VITAMINB12, FOLATE, FERRITIN, TIBC, IRON, RETICCTPCT in the last 72 hours. Sepsis Labs: Recent Labs  Lab 10/08/17 1147  LATICACIDVEN 1.17    Recent Results (from the past 240 hour(s))  Culture, Blood     Status: None (Preliminary result)   Collection Time: 10/08/17 10:30 AM  Result Value Ref Range Status   Specimen Description BLOOD PORTA CATH  Final   Special Requests   Final    BOTTLES DRAWN AEROBIC  AND ANAEROBIC Blood Culture adequate volume   Culture   Final    NO GROWTH < 24 HOURS Performed at [facility], [place] [address], [place], [place] [address]    Report Status PENDING  Incomplete  Urine culture     Status: Abnormal (Preliminary result)   Collection Time: 10/08/17 12:47 PM  Result Value Ref Range Status   Specimen Description   Final    URINE, RANDOM Performed at [facility] [address], [place], [place] [address]    Special Requests   Final    NONE Performed at [facility], [place] [address], [place], [place] [address]    Culture (A)  Final    >=100,000 COLONIES/mL ESCHERICHIA COLI SUSCEPTIBILITIES TO FOLLOW Performed at [facility], [place] [address] [place], [place] [address]    Report Status PENDING  Incomplete  Culture, blood (single) w Reflex to ID Panel     Status: None (Preliminary result)   Collection Time: 10/08/17  1:11 PM  Result Value Ref Range Status   Specimen Description   Final     BLOOD RIGHT HAND Performed at [facility] [address], [place], [place] [address]    Special Requests   Final    BOTTLES DRAWN AEROBIC AND ANAEROBIC Blood Culture results may not be optimal due to an inadequate volume of blood received in culture bottles Performed at [facility] [address] [place], [place] [address]    Culture   Final    NO GROWTH < 24 HOURS Performed at [facility] [address], [place],  [address]    Report Status PENDING  Incomplete         Radiology Studies: US Renal  Result Date: 10/08/2017 CLINICAL DATA:  Acute kidney injury EXAM: RENAL / URINARY TRACT ULTRASOUND COMPLETE COMPARISON:  PET-CT 07/28/2017 and previous FINDINGS: Right Kidney: Length: 12.5. 5.1 x 5 x 4.8 cm cyst from the upper pole. Parenchyma echogenicity normal. No hydronephrosis. Left Kidney: Surgically absent Bladder: Incompletely distended, unremarkable. IMPRESSION: 1. Interval left nephrectomy. 2. Stable right renal cyst. 3. No hydronephrosis. Electronically Signed   By: Lucrezia Europe M.D.   On: 10/08/2017 18:36        Scheduled Meds: . diphenhydrAMINE  50 mg Oral QHS   And  . acetaminophen  1,000 mg Oral QHS  . heparin  5,000 Units Subcutaneous Q8H  . latanoprost  1 drop Both Eyes QHS  . pravastatin  40 mg Oral q1800  . senna  1 tablet Oral BID  . sertraline  25 mg Oral Daily  . traZODone  50 mg Oral QHS   Continuous Infusions: . sodium chloride 75 mL/hr at 10/09/17 0645  . cefTRIAXone (ROCEPHIN)  IV Stopped (10/08/17 1621)     LOS: 1 day       Georgette Shell, MD [facility]  If 7PM-7AM, please contact night-coverage [email or web address] Password [facility] 10/09/2017, 12:16 PM

## 2017-10-09 NOTE — Progress Notes (Signed)
Initial Nutrition Assessment  DOCUMENTATION CODES:   Non-severe (moderate) malnutrition in context of acute illness/injury  INTERVENTION:   Provide Boost Breeze po TID, each supplement provides 250 kcal and 9 grams of protein  NUTRITION DIAGNOSIS:   Moderate Malnutrition related to acute illness, cancer and cancer related treatments, nausea, vomiting as evidenced by percent weight loss, energy intake < or equal to 50% for > or equal to 5 days.  GOAL:   Patient will meet greater than or equal to 90% of their needs  MONITOR:   PO intake, Supplement acceptance, Labs, Weight trends, I & O's  REASON FOR ASSESSMENT:   Malnutrition Screening Tool    ASSESSMENT:   84 y.o. 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   Patient with reported poor appetite and N/V following eating since surgery on 7/31 (s/p nephroureterectomy). Pt eating very little currently, ate a cookie and some liquids yesterday. Today no breakfast and ordered mashed potatoes and an [place] ice for lunch. Will order Boost Breeze for additional protein.  Per weight records, pt has lost 21 lb since 5/1 (10% wt loss x 3.5 months, significant for time frame).  Medications: Senokot tablet BID Labs reviewed: CBGs: 115 Low K  Elevated Phos Mg WNL GFR: 14  NUTRITION - FOCUSED PHYSICAL EXAM:  Nutrition focused physical exam shows no sign of depletion of muscle mass or body fat.  Diet Order:   Diet Order            Diet Heart Room service appropriate? Yes; Fluid consistency: Thin  Diet effective now              EDUCATION NEEDS:   No education needs have been identified at this time  Skin:  Skin Assessment: Reviewed RN Assessment  Last BM:   8/15  Height:   Ht Readings from Last 1 Encounters:  10/08/17 5\' 9"  (1.753 m)    Weight:   Wt Readings from Last 1 Encounters:  10/09/17 86.8 kg    Ideal Body Weight:  72.7 kg  BMI:  Body mass index is 28.26 kg/m.  Estimated Nutritional Needs:   Kcal:  2200-2400  Protein:  110-120g  Fluid:  2.2L/day  Clayton Bibles, MS, RD, LDN [place] Dietitian Pager: [phone] After Hours Pager: [phone]

## 2017-10-09 NOTE — Progress Notes (Signed)
These preliminary result these preliminary results were noted.  Awaiting final report.

## 2017-10-09 NOTE — Evaluation (Signed)
Occupational Therapy Evaluation Patient Details Name: Jonathan Marshall MRN: [id number] DOB: 08-12-1932 Today's Date: 10/09/2017    History of Present Illness pt was admitted for weakness lethargy and nausea/vomiting.  Found to have acute kidney injury superimposed on chronic kidney disease.  s/p nephroureterectomy on 7/31   Clinical Impression   This 84 year old man was admitted for the above.  He reports that he has had a lot of weight loss recently due to not being able to keep food down and subsequent weakness.  He was able to perform ADLs prior to admission. Pt fatiques easily and will benefit from continued OT  Goals in acute are for min A to supervision level.  He needs min guard to ambulate and mod to max A for LB adls at this time    Follow Up Recommendations  Supervision/Assistance - 24 hour;Home health OT    Equipment Recommendations  None recommended by OT    Recommendations for Other Services       Precautions / Restrictions Precautions Precautions: Fall Restrictions Weight Bearing Restrictions: No      Mobility Bed Mobility Overal bed mobility: Modified Independent                Transfers Overall transfer level: Needs assistance Equipment used: Rolling walker (2 wheeled) Transfers: Sit to/from Stand Sit to Stand: Min guard         General transfer comment: for safety; cues for UE placement    Balance                                           ADL either performed or assessed with clinical judgement   ADL Overall ADL's : Needs assistance/impaired     Grooming: Set up;Wash/dry hands;Sitting   Upper Body Bathing: Set up;Sitting   Lower Body Bathing: Moderate assistance;Sit to/from stand   Upper Body Dressing : Minimal assistance;Sitting Upper Body Dressing Details (indicate cue type and reason): help tying and working around port a cath Lower Body Dressing: Maximal assistance;Sit to/from stand   Toilet Transfer: Min  guard;Ambulation;Comfort height toilet;Grab bars;RW   Toileting- Clothing Manipulation and Hygiene: Moderate assistance;Sit to/from stand         General ADL Comments: ambulated to bathroom with min guard. Pt fatiques easily. Encouraged rest breaks     Vision         Perception     Praxis      Pertinent Vitals/Pain Pain Assessment: No/denies pain     Hand Dominance     Extremity/Trunk Assessment Upper Extremity Assessment Upper Extremity Assessment: Overall WFL for tasks assessed           Communication Communication Communication: No difficulties   Cognition Arousal/Alertness: Awake/alert Behavior During Therapy: WFL for tasks assessed/performed Overall Cognitive Status: Within Functional Limits for tasks assessed                                     General Comments       Exercises     Shoulder Instructions      Home Living Family/patient expects to be discharged to:: Private residence Living Arrangements: Spouse/significant other Available Help at Discharge: Family               Bathroom Shower/Tub: Walk-in Corporate treasurer Toilet: Handicapped height  Home Equipment: Shower seat - built in;Grab bars - tub/shower   Additional Comments: has a walker at home:  doesn't use it      Prior Functioning/Environment Level of Independence: Independent                 OT Problem List: Decreased strength;Decreased activity tolerance;Impaired balance (sitting and/or standing);Decreased knowledge of use of DME or AE;Pain      OT Treatment/Interventions: Self-care/ADL training;DME and/or AE instruction;Energy conservation;Patient/family education;Balance training;Therapeutic activities    OT Goals(Current goals can be found in the care plan section) Acute Rehab OT Goals Patient Stated Goal: get strength and appetite back OT Goal Formulation: With patient Time For Goal Achievement: 10/23/17 Potential to Achieve Goals:  Good ADL Goals Pt Will Perform Grooming: with supervision;standing Pt Will Transfer to Toilet: with supervision;ambulating Pt Will Perform Tub/Shower Transfer: Shower transfer;with min guard assist;grab bars;shower seat Additional ADL Goal #1: pt will initiate at least 2 rest breaks for energy conservation Additional ADL Goal #2: pt will complete LB adls with min A  OT Frequency: Min 2X/week   Barriers to D/C:            Co-evaluation              AM-PAC PT "6 Clicks" Daily Activity     Outcome Measure Help from another person eating meals?: None Help from another person taking care of personal grooming?: A Little Help from another person toileting, which includes using toliet, bedpan, or urinal?: A Lot Help from another person bathing (including washing, rinsing, drying)?: A Lot Help from another person to put on and taking off regular upper body clothing?: A Little Help from another person to put on and taking off regular lower body clothing?: A Lot 6 Click Score: 16   End of Session    Activity Tolerance: Patient tolerated treatment well Patient left: in bed;with call bell/phone within reach;with bed alarm set  OT Visit Diagnosis: Muscle weakness (generalized) (M62.81)                Time: 3343-5686 OT Time Calculation (min): 22 min Charges:  OT General Charges $OT Visit: 1 Visit OT Evaluation $OT Eval Low Complexity: 1 Low  Lesle Chris, OTR/L [phone] 10/09/2017  Amyjo Mizrachi 10/09/2017, 12:48 PM

## 2017-10-09 NOTE — Evaluation (Signed)
Physical Therapy Evaluation Patient Details Name: Jonathan Marshall MRN: [id number] DOB: Oct 24, 1932 Today's Date: 10/09/2017   History of Present Illness  pt was admitted for weakness lethargy and nausea/vomiting.  Found to have acute kidney injury superimposed on chronic kidney disease.  s/p nephroureterectomy on 7/31  Clinical Impression  The patient ambulated x 210' with RW and gait was steady. Patient does relate feeling very weak. Pt admitted with above diagnosis. Pt currently with functional limitations due to the deficits listed below (see PT Problem List). Tresa Endo PT [phone]  Pt will benefit from skilled PT to increase their independence and safety with mobility to allow discharge to the venue listed below.       Follow Up Recommendations Home health PT    Equipment Recommendations  None recommended by PT    Recommendations for Other Services       Precautions / Restrictions Precautions Precautions: Fall Precaution Comments: incontinent of BM at times-wears pull ups Restrictions Weight Bearing Restrictions: No      Mobility  Bed Mobility Overal bed mobility: Modified Independent                Transfers Overall transfer level: Needs assistance Equipment used: Rolling walker (2 wheeled) Transfers: Sit to/from Stand Sit to Stand: Min guard         General transfer comment: for safety; cues for UE placement  Ambulation/Gait Ambulation/Gait assistance: Min guard Gait Distance (Feet): 210 Feet Assistive device: Rolling walker (2 wheeled) Gait Pattern/deviations: Step-through pattern     General Gait Details: gait is steady  Financial trader Rankin (Stroke Patients Only)       Balance Overall balance assessment: History of Falls;Needs assistance Sitting-balance support: Feet supported;No upper extremity supported Sitting balance-Leahy Scale: Good     Standing balance support: During functional  activity;No upper extremity supported Standing balance-Leahy Scale: Fair                               Pertinent Vitals/Pain Pain Assessment: No/denies pain    Home Living Family/patient expects to be discharged to:: Private residence Living Arrangements: Spouse/significant other Available Help at Discharge: Family           Home Equipment: Shower seat - built in;Grab bars - tub/shower Additional Comments: has a walker at home:  doesn't use it    Prior Function Level of Independence: Independent               Hand Dominance        Extremity/Trunk Assessment   Upper Extremity Assessment Upper Extremity Assessment: Defer to OT evaluation    Lower Extremity Assessment Lower Extremity Assessment: Generalized weakness       Communication   Communication: No difficulties  Cognition Arousal/Alertness: Awake/alert Behavior During Therapy: WFL for tasks assessed/performed Overall Cognitive Status: Within Functional Limits for tasks assessed                                        General Comments      Exercises     Assessment/Plan    PT Assessment Patient needs continued PT services  PT Problem List Decreased strength;Decreased activity tolerance;Decreased mobility;Decreased knowledge of precautions;Decreased safety awareness;Decreased knowledge of use of DME  PT Treatment Interventions DME instruction;Gait training;Functional mobility training;Therapeutic activities;Therapeutic exercise;Patient/family education    PT Goals (Current goals can be found in the Care Plan section)  Acute Rehab PT Goals Patient Stated Goal: get strength and appetite back PT Goal Formulation: With patient Time For Goal Achievement: 10/23/17 Potential to Achieve Goals: Good    Frequency Min 3X/week   Barriers to discharge        Co-evaluation               AM-PAC PT "6 Clicks" Daily Activity  Outcome Measure Difficulty turning  over in bed (including adjusting bedclothes, sheets and blankets)?: None Difficulty moving from lying on back to sitting on the side of the bed? : None Difficulty sitting down on and standing up from a chair with arms (e.g., wheelchair, bedside commode, etc,.)?: A Little Help needed moving to and from a bed to chair (including a wheelchair)?: A Little Help needed walking in hospital room?: A Lot Help needed climbing 3-5 steps with a railing? : Total 6 Click Score: 17    End of Session Equipment Utilized During Treatment: Gait belt Activity Tolerance: Patient tolerated treatment well Patient left: in bed;with call bell/phone within reach;with bed alarm set Nurse Communication: Mobility status PT Visit Diagnosis: Unsteadiness on feet (R26.81)    Time: 3329-5188 PT Time Calculation (min) (ACUTE ONLY): 17 min   Charges:   PT Evaluation $PT Eval Low Complexity: [place] PT [phone]   Claretha Cooper 10/09/2017, 3:19 PM

## 2017-10-10 DIAGNOSIS — F329 Major depressive disorder, single episode, unspecified: Secondary | ICD-10-CM

## 2017-10-10 DIAGNOSIS — E440 Moderate protein-calorie malnutrition: Secondary | ICD-10-CM

## 2017-10-10 DIAGNOSIS — N138 Other obstructive and reflux uropathy: Secondary | ICD-10-CM

## 2017-10-10 DIAGNOSIS — N401 Enlarged prostate with lower urinary tract symptoms: Secondary | ICD-10-CM

## 2017-10-10 LAB — CBC
HCT: 28.4 % — ABNORMAL LOW (ref 39.0–52.0)
HEMOGLOBIN: 9.1 g/dL — AB (ref 13.0–17.0)
MCH: 30.1 pg (ref 26.0–34.0)
MCHC: 32 g/dL (ref 30.0–36.0)
MCV: 94 fL (ref 78.0–100.0)
Platelets: 468 10*3/uL — ABNORMAL HIGH (ref 150–400)
RBC: 3.02 MIL/uL — ABNORMAL LOW (ref 4.22–5.81)
RDW: 14.4 % (ref 11.5–15.5)
WBC: 9.7 10*3/uL (ref 4.0–10.5)

## 2017-10-10 LAB — BASIC METABOLIC PANEL
ANION GAP: 8 (ref 5–15)
BUN: 55 mg/dL — ABNORMAL HIGH (ref 8–23)
CALCIUM: 9.4 mg/dL (ref 8.9–10.3)
CO2: 15 mmol/L — AB (ref 22–32)
Chloride: 118 mmol/L — ABNORMAL HIGH (ref 98–111)
Creatinine, Ser: 2.99 mg/dL — ABNORMAL HIGH (ref 0.61–1.24)
GFR calc Af Amer: 21 mL/min — ABNORMAL LOW (ref >60)
GFR calc non Af Amer: 18 mL/min — ABNORMAL LOW (ref >60)
GLUCOSE: 105 mg/dL — AB (ref 70–99)
Potassium: 3.7 mmol/L (ref 3.5–5.1)
Sodium: 141 mmol/L (ref 135–145)

## 2017-10-10 LAB — URINE CULTURE: Culture: >=100000 — AB

## 2017-10-10 LAB — GLUCOSE, CAPILLARY: GLUCOSE-CAPILLARY: 95 mg/dL (ref 70–99)

## 2017-10-10 MED ORDER — AMOXICILLIN 250 MG PO CAPS
500.0000 mg | ORAL_CAPSULE | Freq: Two times a day (BID) | ORAL | Status: DC
  Administered 2017-10-10 – 2017-10-14 (×8): 500 mg via ORAL
  Filled 2017-10-10 (×9): qty 2

## 2017-10-10 MED ORDER — SODIUM CHLORIDE 0.9 % IV SOLN
INTRAVENOUS | Status: DC
  Administered 2017-10-10 – 2017-10-13 (×7): via INTRAVENOUS

## 2017-10-10 MED ORDER — SACCHAROMYCES BOULARDII 250 MG PO CAPS
250.0000 mg | ORAL_CAPSULE | Freq: Two times a day (BID) | ORAL | Status: DC
  Administered 2017-10-10 – 2017-10-14 (×9): 250 mg via ORAL
  Filled 2017-10-10 (×8): qty 1

## 2017-10-10 NOTE — Care Management Note (Addendum)
Case Management Note  Patient Details  Name: Jonathan Marshall MRN: [id number] Date of Birth: 26-Mar-1932  Subjective/Objective: 84 yo admitted with AKI.                    Action/Plan: From home with spouse.  PT recommendations gone over with pt at bedside and pt offered choice for HHPT. [facility] chosen and [facility] rep alerted of referral. Will need HHPT order at discharge  Expected Discharge Date:  (unknown)               Expected Discharge Plan:  [place]  In-House Referral:     Discharge planning Services  CM Consult  Post Acute Care Choice:  Home Health Choice offered to:  Patient  DME Arranged:    DME Agency:     HH Arranged:  PT [place]:  [place]  Status of Service:  In process, will continue to follow  If discussed at Long Length of Stay Meetings, dates discussed:    Additional Comments:  30 Day Unplanned Readmission Risk Score     ED to Hosp-Admission (Current) from 10/08/2017 in [place] [facility] ONCOLOGY  30 Day Unplanned Readmission Risk Score (%)  26 Filed at 10/10/2017 0800     This score is the patient's risk of an unplanned readmission within 30 days of being discharged (0 -100%). The score is based on dignosis, age, lab data, medications, orders, and past utilization.   Low:  0-14.9   Medium: 15-21.9   High: 22-29.9   Extreme: 30 and above        Readmission Risk Prevention Plan 10/10/2017  Transportation Screening Complete  PCP or Specialist Appt within 3-5 Days Complete  Home Care Screening Complete  HRI or [place] Complete  Social Work Consult for [place] Planning/Counseling Not Complete  SW consult not completed comments NA  Palliative Care Screening Not Complete  Palliative Care Screening Not Complete Comments NA  Medication Review Press photographer) Complete  Some recent data might be hidden   Lynnell Catalan, RN 10/10/2017, 9:57 AM  [phone]

## 2017-10-10 NOTE — Progress Notes (Signed)
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 Patient recently underwent a left nephroureterectomy on 09/24/2017 and since then he has been feeling more fatigued and weak. Recently had his Foley catheter removed on Monday by urology. Patient states that he has had some nausea vomiting ever since his surgery andstates that his appetite is been extremely poor because he vomits his food up almost every several time. He also states that he has not been drinking much fluids. Patient felt dizzy upon standing and went to go see his oncologist today who checked some blood work and a urine analysis which showed likely UTI. They obtained a blood culture and started the patient on empiric IV Zosyn. Because his kidney function showed a creatinine of 4.4 he was directed to the emergency room for further evaluation.TRH was called to admit this patient for acute kidney injury on chronic kidney disease along with a suspected urinary tract infection  ED Course:Had a liter of normal saline bolused and had a repeat blood culture and urine culture added on.  Assessment & Plan:   Active Problems:   Enlarged prostate with urinary obstruction   Urothelial carcinoma (HCC)   AKI (acute kidney injury) ([place])   Acute urinary tract infection   Generalized weakness   Normocytic anemia   Glaucoma   HLD (hyperlipidemia)   Depression   Malnutrition of moderate degree 1] AKI on CKD stage III renal functions getting better with IV hydration though still high.  Patient has solitary kidney.  Continue IV hydration.  Follow-up  renal ultrasound shows no hydronephrosis, stable right renal cyst, left nephrectomy. Patient has very poor p.o. intake with decreased appetite and also has chronic diarrhea.  Patient reports having recent surgery for urethral cancer and nephrectomy July 31 of 2019.  He STILL  appear to be dehydrated and needs IV hydration.  Dizziness better.  Patient reports generalized weakness and lethargy secondary to nausea vomiting and diarrhea.  He reports part of his colon was removed which caused him to have diarrhea.  Creatinine today is 2.99 which is down from 4.4 yesterday.  2] complicated UTI status post recent catheter placement and removal.  Continue IV ceftriaxone.  Follow-up urine culture E. coli sensitive to ceftriaxone.  3] normocytic anemia drop in hemoglobin may have had some hemodilution with IV fluids.  Stable and improved at 9.1.  4] insomnia patient requesting something to sleep at night.  Will start trazodone 50 mg nightly.  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  6] depression continue sertraline.  7] hypokalemia resolved.    DVT prophylaxis: HEPARIN Code Status:DNR Family Communication: NONE Disposition Plan: Plan for discharge tomorrow if renal functions better.  Consultants: None  Procedures: None Antimicrobials: Rocephin  Subjective: Patient resting in bed reports feeling better than yesterday.  Walked with physical therapy.  Denies any dizziness nausea vomiting has chronic diarrhea.   Objective: Vitals:   10/09/17 0534 10/09/17 1247 10/09/17 2059 10/10/17 0500  BP: 112/63 120/65 109/72 124/67  Pulse: 76 81 78 76  Resp: 16 16 16 14   Temp: 98 F (36.7 C) 98  F (36.7 C) 97.8 F (36.6 C) 97.9 F (36.6 C)  TempSrc: Oral Oral Oral Oral  SpO2: 100% 99% 100% 100%  Weight:    95.3 kg  Height:        Intake/Output Summary (Last 24 hours) at  10/10/2017 1024 Last data filed at 10/09/2017 1808 Gross per 24 hour  Intake 1201.5 ml  Output -  Net 1201.5 ml   Filed Weights   10/08/17 1424 10/09/17 0355 10/10/17 0500  Weight: 87.2 kg 86.8 kg 95.3 kg    Examination:  General exam: Appears calm and comfortable  Respiratory system: Clear to auscultation. Respiratory effort normal. Cardiovascular system: S1 & S2 heard, RRR. No JVD, murmurs, rubs, gallops or clicks. No pedal edema. Gastrointestinal system: Abdomen is nondistended, soft and nontender. No organomegaly or masses felt. Normal bowel sounds heard. Central nervous system: Alert and oriented. No focal neurological deficits. Extremities: Symmetric 5 x 5 power. Skin: No rashes, lesions or ulcers Psychiatry: Judgement and insight appear normal. Mood & affect appropriate.     Data Reviewed: I have personally reviewed following labs and imaging studies  CBC: Recent Labs  Lab 10/08/17 0857 10/08/17 1540 10/09/17 0755 10/10/17 0555  WBC 13.7* 11.0* 8.8 9.7  NEUTROABS 10.4*  --  6.5  --   HGB 10.1* 8.5* 8.5* 9.1*  HCT 30.7* 25.4* 25.4* 28.4*  MCV 92.5 92.0 92.4 94.0  PLT 415* 450* 432* 751*   Basic Metabolic Panel: Recent Labs  Lab 10/08/17 0857 10/08/17 1540 10/09/17 0755 10/10/17 0555  NA 136  --  139 141  K 3.7  --  3.3* 3.7  CL 106  --  115* 118*  CO2 12*  --  15* 15*  GLUCOSE 107*  --  93 105*  BUN 82*  --  70* 55*  CREATININE 4.40* 4.07* 3.61* 2.99*  CALCIUM 10.0  --  8.9 9.4  MG 2.4 2.3 2.2  --   PHOS  --  5.2* 4.9*  --    GFR: Estimated Creatinine Clearance: 21.3 mL/min (A) (by C-G formula based on SCr of 2.99 mg/dL (H)). Liver Function Tests: Recent Labs  Lab 10/08/17 0857 10/09/17 0755  AST 33 21  ALT 39 28  ALKPHOS 93 65  BILITOT 0.3 0.3  PROT 7.3 6.1*  ALBUMIN 3.2* 2.9*   No results for input(s): LIPASE, AMYLASE in the last 168 hours. No results for input(s): AMMONIA in the last 168 hours. Coagulation Profile: No results for  input(s): INR, PROTIME in the last 168 hours. Cardiac Enzymes: No results for input(s): CKTOTAL, CKMB, CKMBINDEX, TROPONINI in the last 168 hours. BNP (last 3 results) No results for input(s): PROBNP in the last 8760 hours. HbA1C: No results for input(s): HGBA1C in the last 72 hours. CBG: Recent Labs  Lab 10/09/17 0722 10/10/17 0745  GLUCAP 115* 95   Lipid Profile: No results for input(s): CHOL, HDL, LDLCALC, TRIG, CHOLHDL, LDLDIRECT in the last 72 hours. Thyroid Function Tests: Recent Labs    10/08/17 1540  TSH 0.529   Anemia Panel: No results for input(s): VITAMINB12, FOLATE, FERRITIN, TIBC, IRON, RETICCTPCT in the last 72 hours. Sepsis Labs: Recent Labs  Lab 10/08/17 1147  LATICACIDVEN 1.17    Recent Results (from the past 240 hour(s))  Culture, Blood     Status: None (Preliminary result)   Collection Time: 10/08/17 10:30 AM  Result Value Ref Range Status   Specimen Description BLOOD PORTA CATH  Final   Special Requests   Final  BOTTLES DRAWN AEROBIC AND ANAEROBIC Blood Culture adequate volume   Culture   Final    NO GROWTH < 24 HOURS Performed at [facility], [place] [address] [place] [address] [place], [place] [address]    Report Status PENDING  Incomplete  Urine culture     Status: Abnormal   Collection Time: 10/08/17 12:47 PM  Result Value Ref Range Status   Specimen Description   Final    URINE, RANDOM Performed at [facility] [address], [place], [place] [address]    Special Requests   Final    NONE Performed at [facility], [place] [address] [place], [place] [address]    Culture >=100,000 COLONIES/mL ESCHERICHIA COLI (A)  Final   Report Status 10/10/2017 FINAL  Final   Organism ID, Bacteria ESCHERICHIA COLI (A)  Final      Susceptibility   Escherichia coli - MIC*    AMPICILLIN <=2 SENSITIVE Sensitive     CEFAZOLIN <=4 SENSITIVE Sensitive     CEFTRIAXONE <=1 SENSITIVE Sensitive     CIPROFLOXACIN <=0.25 SENSITIVE  Sensitive     GENTAMICIN <=1 SENSITIVE Sensitive     IMIPENEM <=0.25 SENSITIVE Sensitive     NITROFURANTOIN <=16 SENSITIVE Sensitive     TRIMETH/SULFA <=20 SENSITIVE Sensitive     AMPICILLIN/SULBACTAM <=2 SENSITIVE Sensitive     PIP/TAZO <=4 SENSITIVE Sensitive     Extended ESBL NEGATIVE Sensitive     * >=100,000 COLONIES/mL ESCHERICHIA COLI  Culture, blood (single) w Reflex to ID Panel     Status: None (Preliminary result)   Collection Time: 10/08/17  1:11 PM  Result Value Ref Range Status   Specimen Description   Final    BLOOD RIGHT HAND Performed at [facility] [address] [place], [place] [address]    Special Requests   Final    BOTTLES DRAWN AEROBIC AND ANAEROBIC Blood Culture results may not be optimal due to an inadequate volume of blood received in culture bottles Performed at [facility] [address] [place], [place] [address]    Culture   Final    NO GROWTH < 24 HOURS Performed at [facility] [address], [place], [place] [address]    Report Status PENDING  Incomplete         Radiology Studies: US Renal  Result Date: 10/08/2017 CLINICAL DATA:  Acute kidney injury EXAM: RENAL / URINARY TRACT ULTRASOUND COMPLETE COMPARISON:  PET-CT 07/28/2017 and previous FINDINGS: Right Kidney: Length: 12.5. 5.1 x 5 x 4.8 cm cyst from the upper pole. Parenchyma echogenicity normal. No hydronephrosis. Left Kidney: Surgically absent Bladder: Incompletely distended, unremarkable. IMPRESSION: 1. Interval left nephrectomy. 2. Stable right renal cyst. 3. No hydronephrosis. Electronically Signed   By: Lucrezia Europe M.D.   On: 10/08/2017 18:36        Scheduled Meds: . feeding supplement  1 Container Oral TID BM  . heparin  5,000 Units Subcutaneous Q8H  . latanoprost  1 drop Both Eyes QHS  . pravastatin  40 mg Oral q1800  . senna  1 tablet Oral BID  . sertraline  25 mg Oral Daily  . traZODone  50 mg Oral QHS   Continuous  Infusions: . sodium chloride    . cefTRIAXone (ROCEPHIN)  IV Stopped (10/09/17 1707)     LOS: 2 days     Georgette Shell, MD [facility]  If 7PM-7AM, please contact night-coverage [email or web address] Password [facility] 10/10/2017, 10:24 AM

## 2017-10-10 NOTE — Progress Notes (Deleted)
CRITICAL VALUE ALERT    

## 2017-10-10 NOTE — Progress Notes (Signed)
These preliminary result these preliminary results were noted.  Awaiting final report.

## 2017-10-10 NOTE — Care Management Important Message (Signed)
Important Message  Patient Details  Name: Jonathan Marshall MRN: [id number] Date of Birth: [birth year]   Medicare Important Message Given:  Yes    Kerin Salen 10/10/2017, 10:35 AMImportant Message  Patient Details  Name: Jonathan Marshall MRN: [id number] Date of Birth: 30-Sep-1932   Medicare Important Message Given:  Yes    Kerin Salen 10/10/2017, 10:35 AM

## 2017-10-10 NOTE — Plan of Care (Signed)
  Problem: Elimination: Goal: Will not experience complications related to bowel motility Outcome: Progressing   Problem: Safety: Goal: Ability to remain free from injury will improve Outcome: Progressing   Problem: Skin Integrity: Goal: Risk for impaired skin integrity will decrease Outcome: Progressing   

## 2017-10-11 DIAGNOSIS — R531 Weakness: Secondary | ICD-10-CM

## 2017-10-11 LAB — BASIC METABOLIC PANEL
Anion gap: 5 (ref 5–15)
BUN: 42 mg/dL — AB (ref 8–23)
CHLORIDE: 122 mmol/L — AB (ref 98–111)
CO2: 15 mmol/L — ABNORMAL LOW (ref 22–32)
Calcium: 9.4 mg/dL (ref 8.9–10.3)
Creatinine, Ser: 2.44 mg/dL — ABNORMAL HIGH (ref 0.61–1.24)
GFR calc Af Amer: 27 mL/min — ABNORMAL LOW (ref >60)
GFR, EST NON AFRICAN AMERICAN: 23 mL/min — AB (ref >60)
GLUCOSE: 108 mg/dL — AB (ref 70–99)
POTASSIUM: 3.9 mmol/L (ref 3.5–5.1)
SODIUM: 142 mmol/L (ref 135–145)

## 2017-10-11 LAB — CBC
HEMATOCRIT: 27.7 % — AB (ref 39.0–52.0)
Hemoglobin: 9 g/dL — ABNORMAL LOW (ref 13.0–17.0)
MCH: 30.8 pg (ref 26.0–34.0)
MCHC: 32.5 g/dL (ref 30.0–36.0)
MCV: 94.9 fL (ref 78.0–100.0)
Platelets: 405 10*3/uL — ABNORMAL HIGH (ref 150–400)
RBC: 2.92 MIL/uL — ABNORMAL LOW (ref 4.22–5.81)
RDW: 14.5 % (ref 11.5–15.5)
WBC: 7.4 10*3/uL (ref 4.0–10.5)

## 2017-10-11 LAB — GLUCOSE, CAPILLARY: GLUCOSE-CAPILLARY: 122 mg/dL — AB (ref 70–99)

## 2017-10-11 MED ORDER — LOPERAMIDE HCL 2 MG PO CAPS
2.0000 mg | ORAL_CAPSULE | Freq: Four times a day (QID) | ORAL | Status: DC | PRN
  Filled 2017-10-11: qty 1

## 2017-10-11 MED ORDER — MIDODRINE HCL 2.5 MG PO TABS
2.5000 mg | ORAL_TABLET | Freq: Three times a day (TID) | ORAL | Status: DC
  Administered 2017-10-11 – 2017-10-14 (×8): 2.5 mg via ORAL
  Filled 2017-10-11 (×9): qty 1

## 2017-10-11 MED ORDER — LOPERAMIDE HCL 2 MG PO CAPS
2.0000 mg | ORAL_CAPSULE | Freq: Two times a day (BID) | ORAL | Status: DC
  Administered 2017-10-11 – 2017-10-14 (×7): 2 mg via ORAL
  Filled 2017-10-11 (×6): qty 1

## 2017-10-11 MED ORDER — MIRTAZAPINE 15 MG PO TBDP
7.5000 mg | ORAL_TABLET | Freq: Every day | ORAL | Status: DC
  Administered 2017-10-11 – 2017-10-13 (×3): 7.5 mg via ORAL
  Filled 2017-10-11 (×4): qty 0.5

## 2017-10-11 NOTE — Progress Notes (Signed)
Tech got patient up to restroom. Patient said "he did not know what was going on". Tech took the patient's vitals, blood pressure was 70/53 with the pulse rate of 114 while standing. During the techs rounds the patient's blood pressure was 132/79; his pulse was 87 while lying. Will notify the attending.  Reinaldo Berber, RN 10/11/2017 [phone]

## 2017-10-11 NOTE — Progress Notes (Signed)
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 Patient recently underwent a left nephroureterectomy on 09/24/2017 and since then he has been feeling more fatigued and weak. Recently had his Foley catheter removed on Monday by urology. Patient states that he has had some nausea vomiting ever since his surgery andstates that his appetite is been extremely poor because he vomits his food up almost every several time. He also states that he has not been drinking much fluids. Patient felt dizzy upon standing and went to go see his oncologist today who checked some blood work and a urine analysis which showed likely UTI. They obtained a blood culture and started the patient on empiric IV Zosyn. Because his kidney function showed a creatinine of 4.4 he was directed to the emergency room for further evaluation.TRH was called to admit this patient for acute kidney injury on chronic kidney disease along with a suspected urinary tract infection  ED Course:Had a liter of normal saline bolused and had a repeat blood culture and urine culture added on.  Assessment & Plan:   Active Problems:   Enlarged prostate with urinary obstruction   Urothelial carcinoma (HCC)   Acute kidney injury ([place])   Acute urinary tract infection   Generalized weakness   Normocytic anemia   Glaucoma   HLD (hyperlipidemia)   Depression   Malnutrition of moderate degree  1]AKI on CKD stage III renal functions getting better with IV hydration though still high. Patient has solitary kidney. Continue IV hydration. Follow-up renal  ultrasound shows no hydronephrosis, stable right renal cyst, left nephrectomy.Patient has very poor p.o. intake with decreased appetite and also has chronic diarrhea. Patient reports having recent surgery for urethral cancer and nephrectomy July 31 of 2019. He STILL  appear to be dehydrated and needs IV hydration.  He is complaining of dizziness when standing up.  Orthostatics positive with decreasing blood pressure and tachycardia upon standing compared to pain down.  We will start him on TED hose continue IV fluids I Imodium as needed and standing dose continue Florastor.  Patient has chronic diarrhea.  Denies any nausea vomiting.  I will also start him on Remeron to increase his appetite which will also help his sleep.  Patient agrees with the plan.  Creatinine today 2.44.  His blood pressure was 70/53 and heart rate was 114 while standing and 132/79 and pulse was 87 while laying down.  2]complicated UTI status post recent catheter placement and removal.  Continue antibiotics.   3]normocytic anemia drop in hemoglobin may have had some hemodilution with IV fluids.  Stable and improved at 9.1.  4]insomnia patient requesting something to sleep at night.  Starting patient on Remeron to increase appetite which will also help with the sleep.  DC trazodone.   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  6]depression continue sertraline.  7]hypokalemia resolved.   DVT prophylaxis heparin Code Status: DO NOT RESUSCITATE Family Communication: None Disposition Plan: TBD   Consultants: None  Procedures: None Antimicrobials: Amoxicillin  Subjective: Reports he is lightheaded when standing up does not feel strong enough  to go home has no appetite continues to have diarrhea no nausea vomiting denies chest pain shortness of breath or cough   Objective: Vitals:   10/10/17  2054 10/10/17 2054 10/11/17 0414 10/11/17 0631  BP: 127/85 127/85 132/79 (!) 70/53  Pulse: 72 72 87 (!) 114  Resp: 17 17 (!) 24   Temp: 97.8 F (36.6 C) 97.8 F (36.6 C) 97.8 F (36.6 C)   TempSrc: Oral Oral Oral   SpO2: 100% 100% 100%   Weight:   77.7 kg   Height:        Intake/Output Summary (Last 24 hours) at 10/11/2017 1312 Last data filed at 10/10/2017 2216 Gross per 24 hour  Intake 521.21 ml  Output -  Net 521.21 ml   Filed Weights   10/09/17 0355 10/10/17 0500 10/11/17 0414  Weight: 86.8 kg 95.3 kg 77.7 kg    Examination: Oral mucosa dry  General exam: Appears calm and comfortable  Respiratory system: Clear to auscultation. Respiratory effort normal. Cardiovascular system: S1 & S2 heard, RRR. No JVD, murmurs, rubs, gallops or clicks. No pedal edema. Gastrointestinal system: Abdomen is nondistended, soft and nontender. No organomegaly or masses felt. Normal bowel sounds heard. Central nervous system: Alert and oriented. No focal neurological deficits. Extremities: Symmetric 5 x 5 power. Skin: No rashes, lesions or ulcers Psychiatry: Judgement and insight appear normal. Mood & affect appropriate.     Data Reviewed: I have personally reviewed following labs and imaging studies  CBC: Recent Labs  Lab 10/08/17 0857 10/08/17 1540 10/09/17 0755 10/10/17 0555 10/11/17 0610  WBC 13.7* 11.0* 8.8 9.7 7.4  NEUTROABS 10.4*  --  6.5  --   --   HGB 10.1* 8.5* 8.5* 9.1* 9.0*  HCT 30.7* 25.4* 25.4* 28.4* 27.7*  MCV 92.5 92.0 92.4 94.0 94.9  PLT 415* 450* 432* 468* 209*   Basic Metabolic Panel: Recent Labs  Lab 10/08/17 0857 10/08/17 1540 10/09/17 0755 10/10/17 0555 10/11/17 0610  NA 136  --  139 141 142  K 3.7  --  3.3* 3.7 3.9  CL 106  --  115* 118* 122*  CO2 12*  --  15* 15* 15*  GLUCOSE 107*  --  93 105* 108*  BUN 82*  --  70* 55* 42*  CREATININE 4.40* 4.07* 3.61* 2.99* 2.44*  CALCIUM 10.0  --  8.9 9.4 9.4  MG 2.4 2.3 2.2  --   --   PHOS  --  5.2* 4.9*   --   --    GFR: Estimated Creatinine Clearance: 22.9 mL/min (A) (by C-G formula based on SCr of 2.44 mg/dL (H)). Liver Function Tests: Recent Labs  Lab 10/08/17 0857 10/09/17 0755  AST 33 21  ALT 39 28  ALKPHOS 93 65  BILITOT 0.3 0.3  PROT 7.3 6.1*  ALBUMIN 3.2* 2.9*   No results for input(s): LIPASE, AMYLASE in the last 168 hours. No results for input(s): AMMONIA in the last 168 hours. Coagulation Profile: No results for input(s): INR, PROTIME in the last 168 hours. Cardiac Enzymes: No results for input(s): CKTOTAL, CKMB, CKMBINDEX, TROPONINI in the last 168 hours. BNP (last 3 results) No results for input(s): PROBNP in the last 8760 hours. HbA1C: No results for input(s): HGBA1C in the last 72 hours. CBG: Recent Labs  Lab 10/09/17 0722 10/10/17 0745 10/11/17 0733  GLUCAP 115* 95 122*   Lipid Profile: No results for input(s): CHOL, HDL, LDLCALC, TRIG, CHOLHDL, LDLDIRECT in the last 72 hours. Thyroid Function Tests: Recent  Labs    10/08/17 1540  TSH 0.529   Anemia Panel: No results for input(s): VITAMINB12, FOLATE, FERRITIN, TIBC, IRON, RETICCTPCT in the last 72 hours. Sepsis Labs: Recent Labs  Lab 10/08/17 1147  LATICACIDVEN 1.17    Recent Results (from the past 240 hour(s))  Culture, Blood     Status: None (Preliminary result)   Collection Time: 10/08/17 10:30 AM  Result Value Ref Range Status   Specimen Description BLOOD PORTA CATH  Final   Special Requests   Final    BOTTLES DRAWN AEROBIC AND ANAEROBIC Blood Culture adequate volume   Culture   Final    NO GROWTH 2 DAYS Performed at [facility], [address], [place], [place] [address]    Report Status PENDING  Incomplete  Urine culture     Status: Abnormal   Collection Time: 10/08/17 12:47 PM  Result Value Ref Range Status   Specimen Description   Final    URINE, RANDOM Performed at [facility] [address], [place], [place] [address]    Special Requests   Final      NONE Performed at [facility], [place] [address], [place], [place] [address]    Culture >=100,000 COLONIES/mL ESCHERICHIA COLI (A)  Final   Report Status 10/10/2017 FINAL  Final   Organism ID, Bacteria ESCHERICHIA COLI (A)  Final      Susceptibility   Escherichia coli - MIC*    AMPICILLIN <=2 SENSITIVE Sensitive     CEFAZOLIN <=4 SENSITIVE Sensitive     CEFTRIAXONE <=1 SENSITIVE Sensitive     CIPROFLOXACIN <=0.25 SENSITIVE Sensitive     GENTAMICIN <=1 SENSITIVE Sensitive     IMIPENEM <=0.25 SENSITIVE Sensitive     NITROFURANTOIN <=16 SENSITIVE Sensitive     TRIMETH/SULFA <=20 SENSITIVE Sensitive     AMPICILLIN/SULBACTAM <=2 SENSITIVE Sensitive     PIP/TAZO <=4 SENSITIVE Sensitive     Extended ESBL NEGATIVE Sensitive     * >=100,000 COLONIES/mL ESCHERICHIA COLI  Culture, blood (single) w Reflex to ID Panel     Status: None (Preliminary result)   Collection Time: 10/08/17  1:11 PM  Result Value Ref Range Status   Specimen Description   Final    BLOOD RIGHT HAND Performed at [facility] [address] [place], [place] [address]    Special Requests   Final    BOTTLES DRAWN AEROBIC AND ANAEROBIC Blood Culture results may not be optimal due to an inadequate volume of blood received in culture bottles Performed at [facility] [address], [place], [place] [address]    Culture   Final    NO GROWTH 2 DAYS Performed at [facility] [address], [place],  [address]    Report Status PENDING  Incomplete         Radiology Studies: No results found.      Scheduled Meds: . amoxicillin  500 mg Oral Q12H  . feeding supplement  1 Container Oral TID BM  . heparin  5,000 Units Subcutaneous Q8H  . latanoprost  1 drop Both Eyes QHS  . loperamide  2 mg Oral BID  . mirtazapine  7.5 mg Oral QHS  . pravastatin  40 mg Oral q1800  . saccharomyces boulardii  250 mg Oral BID  . sertraline  25 mg Oral Daily    Continuous Infusions: . sodium chloride 75 mL/hr at 10/11/17 0726     LOS: 3 days  Georgette Shell, MD [facility]  If 7PM-7AM, please contact night-coverage [email or web address] Password [email or web address] 10/11/2017, 1:12 PM

## 2017-10-11 NOTE — Progress Notes (Deleted)
CRITICAL VALUE ALERT  Critical Value:  lactic acid 2.8  Date & Time Notied:  10/10/17 2000  Provider Notified: [facility]  Orders Received/Actions taken:  Dorthula Rue RN awared, Waiting follow-up from MD

## 2017-10-12 LAB — CBC
HEMATOCRIT: 28.5 % — AB (ref 39.0–52.0)
HEMOGLOBIN: 9.2 g/dL — AB (ref 13.0–17.0)
MCH: 30.9 pg (ref 26.0–34.0)
MCHC: 32.3 g/dL (ref 30.0–36.0)
MCV: 95.6 fL (ref 78.0–100.0)
Platelets: 381 10*3/uL (ref 150–400)
RBC: 2.98 MIL/uL — AB (ref 4.22–5.81)
RDW: 14.6 % (ref 11.5–15.5)
WBC: 7.4 10*3/uL (ref 4.0–10.5)

## 2017-10-12 LAB — BASIC METABOLIC PANEL
ANION GAP: 5 (ref 5–15)
BUN: 32 mg/dL — ABNORMAL HIGH (ref 8–23)
CHLORIDE: 122 mmol/L — AB (ref 98–111)
CO2: 15 mmol/L — AB (ref 22–32)
CREATININE: 2.32 mg/dL — AB (ref 0.61–1.24)
Calcium: 9.2 mg/dL (ref 8.9–10.3)
GFR calc non Af Amer: 24 mL/min — ABNORMAL LOW (ref >60)
GFR, EST AFRICAN AMERICAN: 28 mL/min — AB (ref >60)
Glucose, Bld: 95 mg/dL (ref 70–99)
POTASSIUM: 3.8 mmol/L (ref 3.5–5.1)
Sodium: 142 mmol/L (ref 135–145)

## 2017-10-12 LAB — GLUCOSE, CAPILLARY: Glucose-Capillary: 88 mg/dL (ref 70–99)

## 2017-10-12 MED ORDER — FAMOTIDINE IN NACL 20-0.9 MG/50ML-% IV SOLN
20.0000 mg | Freq: Two times a day (BID) | INTRAVENOUS | Status: DC
  Administered 2017-10-12 (×2): 20 mg via INTRAVENOUS
  Filled 2017-10-12 (×2): qty 50

## 2017-10-12 MED ORDER — ONDANSETRON HCL 4 MG/2ML IJ SOLN
4.0000 mg | Freq: Three times a day (TID) | INTRAMUSCULAR | Status: DC
  Administered 2017-10-12 – 2017-10-14 (×5): 4 mg via INTRAVENOUS
  Filled 2017-10-12 (×5): qty 2

## 2017-10-12 NOTE — Progress Notes (Signed)
Occupational Therapy Treatment Patient Details Name: MICHIEL SIVLEY MRN: [id number] DOB: [birth year] Today's Date: 10/12/2017    History of present illness pt was admitted for weakness lethargy and nausea/vomiting.  Found to have acute kidney injury superimposed on chronic kidney disease.  s/p nephroureterectomy on 7/31   OT comments  Wife present for end of OT session  Follow Up Recommendations  Supervision/Assistance - 24 hour;Home health OT    Equipment Recommendations  None recommended by OT    Recommendations for Other Services      Precautions / Restrictions Precautions Precautions: Fall;None Precaution Comments: incontinent of BM at times-wears pull ups       Mobility Bed Mobility Overal bed mobility: Modified Independent                Transfers Overall transfer level: Needs assistance Equipment used: Rolling walker (2 wheeled) Transfers: Sit to/from Stand Sit to Stand: Min guard         General transfer comment: for safety; cues for UE placement    Balance Overall balance assessment: History of Falls;Needs assistance Sitting-balance support: Feet supported;No upper extremity supported Sitting balance-Leahy Scale: Good     Standing balance support: During functional activity;No upper extremity supported Standing balance-Leahy Scale: Fair                             ADL either performed or assessed with clinical judgement   ADL Overall ADL's : Needs assistance/impaired                         Toilet Transfer: Minimal assistance;Stand-pivot;Cueing for sequencing;Cueing for safety Toilet Transfer Details (indicate cue type and reason): bed to chair Toileting- Clothing Manipulation and Hygiene: Minimal assistance;Sit to/from stand;Cueing for sequencing;Cueing for safety         General ADL Comments: pt had been nausous this morning.  Encouraged pt to get to chair. RN brought meds. Pt does want to walk later, NT aware.      Vision Patient Visual Report: No change from baseline     Perception     Praxis      Cognition Arousal/Alertness: Awake/alert Behavior During Therapy: WFL for tasks assessed/performed Overall Cognitive Status: Within Functional Limits for tasks assessed                                                     Pertinent Vitals/ Pain       Pain Assessment: No/denies pain     Prior Functioning/Environment              Frequency  Min 2X/week        Progress Toward Goals  OT Goals(current goals can now be found in the care plan section)  Progress towards OT goals: Progressing toward goals     Plan         AM-PAC PT "6 Clicks" Daily Activity     Outcome Measure   Help from another person eating meals?: None Help from another person taking care of personal grooming?: A Little Help from another person toileting, which includes using toliet, bedpan, or urinal?: A Lot Help from another person bathing (including washing, rinsing, drying)?: A Lot Help from another person to put on and taking off regular upper body clothing?: A  Little Help from another person to put on and taking off regular lower body clothing?: A Lot 6 Click Score: 16    End of Session    OT Visit Diagnosis: Muscle weakness (generalized) (M62.81)   Activity Tolerance Patient tolerated treatment well   Patient Left in bed;with call bell/phone within reach;in chair;with chair alarm set   Nurse Communication Mobility status        Time: 1315-1330 OT Time Calculation (min): 15 min  Charges: OT General Charges $OT Visit: 1 Visit OT Treatments $Self Care/Home Management : 8-22 mins  [place], [place]   Payton Mccallum D 10/12/2017, 2:21 PM

## 2017-10-12 NOTE — Progress Notes (Signed)
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 Patient recently underwent a left nephroureterectomy on 09/24/2017 and since then he has been feeling more fatigued and weak. Recently had his Foley catheter removed on Monday by urology. Patient states that he has had some nausea vomiting ever since his surgery andstates that his appetite is been extremely poor because he vomits his food up almost every several time. He also states that he has not been drinking much fluids. Patient felt dizzy upon standing and went to go see his oncologist today who checked some blood work and a urine analysis which showed likely UTI. They obtained a blood culture and started the patient on empiric IV Zosyn. Because his kidney function showed a creatinine of 4.4 he was directed to the emergency room for further evaluation.TR[facility] was called to admit this patient for acute kidney injury on chronic kidney disease along with a suspected urinary tract infection  ED Course:Had a liter of normal saline bolused and had a repeat blood culture and urine culture added on.  Assessment & Plan:   Active Problems:   Enlarged prostate with urinary obstruction   Urothelial carcinoma (HCC)   AKI (acute kidney injury) ([place])   Acute urinary tract infection   Generalized weakness   Normocytic anemia   Glaucoma   HLD (hyperlipidemia)   Depression   Malnutrition of moderate degree  1]AKI on CKD stage III renal functions getting better with IV hydration though still high. Patient has solitary kidney. Continue IV hydration. Follow-up  renal ultrasoundshows no hydronephrosis, stable right renal cyst, left nephrectomy.Patient has very poor p.o. intake with decreased appetite and also has chronic diarrhea. Patient reports having recent surgery for urethral cancer and nephrectomy July 31 of 2019. HeSTILLappear to be dehydrated and needs IV hydration.  He is complaining of dizziness when standing up.  Orthostatics for today pending.  Staff is not able to do orthostatics earlier as he started vomiting and had diarrhea.  Continue Imodium, Florastor, IV fluids.  Patient has history of chronic diarrhea since 1993 when he had colon  Surgery for colon cancer however his stool has never been green.  Check stool studies.  Start PPI follow tomorrow.  2]complicated E. coli UTI status post recent catheter placement and removal.  Continue antibiotics.   3]normocytic anemia drop in hemoglobin may have had some hemodilution with IV fluids.Stable and improved at 9.1.  4]insomnia patient requesting something to sleep at night.  Starting patient on Remeron to increase appetite which will also help with the sleep.  DC trazodone.   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  6]depression continue sertraline.  7]hypokalemia resolved.    DVT prophylaxis heparin Code Status:dnr Family Communication: Discussed with his wife Baldo Ash Disposition Plan tbd Consultants: none   Procedures:none Antimicrobials amoxicillin Subjective: Staff reports he had diarrhea loose green watery and vomiting no appetite   Objective: Vitals:   10/11/17 1723 10/11/17 1725 10/11/17 2041 10/12/17 0452  BP: (!) 135/116 (!) 65/42 113/76 132/78  Pulse: 82 (!) 115 79 85  Resp: 16 16 16 18   Temp:   98.3  F (36.8 C) 98.7 F (37.1 C)  TempSrc:   Oral Oral  SpO2: 93% 93% 100% 98%  Weight:      Height:        Intake/Output  Summary (Last 24 hours) at 10/12/2017 1201 Last data filed at 10/12/2017 0600 Gross per 24 hour  Intake 1912.59 ml  Output -  Net 1912.59 ml   Filed Weights   10/09/17 0355 10/10/17 0500 10/11/17 0414  Weight: 86.8 kg 95.3 kg 77.7 kg    Examination:oral mucosa dry   General exam: Appears calm and comfortable  Respiratory system: Clear to auscultation. Respiratory effort normal. Cardiovascular system: S1 & S2 heard, RRR. No JVD, murmurs, rubs, gallops or clicks. No pedal edema. Gastrointestinal system: Abdomen is nondistended, soft and nontender. No organomegaly or masses felt. Normal bowel sounds heard. Central nervous system: Alert and oriented. No focal neurological deficits. Extremities: Symmetric 5 x 5 power. Skin: No rashes, lesions or ulcers Psychiatry: Judgement and insight appear normal. Mood & affect appropriate.     Data Reviewed: I have personally reviewed following labs and imaging studies  CBC: Recent Labs  Lab 10/08/17 0857 10/08/17 1540 10/09/17 0755 10/10/17 0555 10/11/17 0610 10/12/17 0545  WBC 13.7* 11.0* 8.8 9.7 7.4 7.4  NEUTROABS 10.4*  --  6.5  --   --   --   HGB 10.1* 8.5* 8.5* 9.1* 9.0* 9.2*  HCT 30.7* 25.4* 25.4* 28.4* 27.7* 28.5*  MCV 92.5 92.0 92.4 94.0 94.9 95.6  PLT 415* 450* 432* 468* 405* 366   Basic Metabolic Panel: Recent Labs  Lab 10/08/17 0857 10/08/17 1540 10/09/17 0755 10/10/17 0555 10/11/17 0610 10/12/17 0545  NA 136  --  139 141 142 142  K 3.7  --  3.3* 3.7 3.9 3.8  CL 106  --  115* 118* 122* 122*  CO2 12*  --  15* 15* 15* 15*  GLUCOSE 107*  --  93 105* 108* 95  BUN 82*  --  70* 55* 42* 32*  CREATININE 4.40* 4.07* 3.61* 2.99* 2.44* 2.32*  CALCIUM 10.0  --  8.9 9.4 9.4 9.2  MG 2.4 2.3 2.2  --   --   --   PHOS  --  5.2* 4.9*  --   --   --    GFR: Estimated Creatinine Clearance: 24.1 mL/min (A) (by C-G formula based on SCr of 2.32 mg/dL (H)). Liver Function Tests: Recent Labs  Lab 10/08/17 0857 10/09/17 0755  AST  33 21  ALT 39 28  ALKPHOS 93 65  BILITOT 0.3 0.3  PROT 7.3 6.1*  ALBUMIN 3.2* 2.9*   No results for input(s): LIPASE, AMYLASE in the last 168 hours. No results for input(s): AMMONIA in the last 168 hours. Coagulation Profile: No results for input(s): INR, PROTIME in the last 168 hours. Cardiac Enzymes: No results for input(s): CKTOTAL, CKMB, CKMBINDEX, TROPONINI in the last 168 hours. BNP (last 3 results) No results for input(s): PROBNP in the last 8760 hours. HbA1C: No results for input(s): HGBA1C in the last 72 hours. CBG: Recent Labs  Lab 10/09/17 0722 10/10/17 0745 10/11/17 0733 10/12/17 0733  GLUCAP 115* 95 122* 88   Lipid Profile: No results for input(s): CHOL, HDL, LDLCALC, TRIG, CHOLHDL, LDLDIRECT in the last 72 hours. Thyroid Function Tests: No results for input(s): TSH, T4TOTAL, FREET4, T3FREE, THYROIDAB in the last 72 hours. Anemia Panel: No results for input(s): VITAMINB12, FOLATE, FERRITIN, TIBC, IRON, RETICCTPCT in the last 72 hours. Sepsis Labs: Recent Labs  Lab  10/08/17 1147  LATICACIDVEN 1.17    Recent Results (from the past 240 hour(s))  Culture, Blood     Status: None (Preliminary result)   Collection Time: 10/08/17 10:30 AM  Result Value Ref Range Status   Specimen Description BLOOD PORTA CATH  Final   Special Requests   Final    BOTTLES DRAWN AEROBIC AND ANAEROBIC Blood Culture adequate volume   Culture   Final    NO GROWTH 3 DAYS Performed at [facility], [address], [place], [place] [address]    Report Status PENDING  Incomplete  Urine culture     Status: Abnormal   Collection Time: 10/08/17 12:47 PM  Result Value Ref Range Status   Specimen Description   Final    URINE, RANDOM Performed at [facility] [address] [place], [place] [address]    Special Requests   Final    NONE Performed at [facility], [place] [address] [place], [place] [address]    Culture >=100,000 COLONIES/mL  ESCHERICHIA COLI (A)  Final   Report Status 10/10/2017 FINAL  Final   Organism ID, Bacteria ESCHERICHIA COLI (A)  Final      Susceptibility   Escherichia coli - MIC*    AMPICILLIN <=2 SENSITIVE Sensitive     CEFAZOLIN <=4 SENSITIVE Sensitive     CEFTRIAXONE <=1 SENSITIVE Sensitive     CIPROFLOXACIN <=0.25 SENSITIVE Sensitive     GENTAMICIN <=1 SENSITIVE Sensitive     IMIPENEM <=0.25 SENSITIVE Sensitive     NITROFURANTOIN <=16 SENSITIVE Sensitive     TRIMETH/SULFA <=20 SENSITIVE Sensitive     AMPICILLIN/SULBACTAM <=2 SENSITIVE Sensitive     PIP/TAZO <=4 SENSITIVE Sensitive     Extended ESBL NEGATIVE Sensitive     * >=100,000 COLONIES/mL ESCHERICHIA COLI  Culture, blood (single) w Reflex to ID Panel     Status: None (Preliminary result)   Collection Time: 10/08/17  1:11 PM  Result Value Ref Range Status   Specimen Description   Final    BLOOD RIGHT HAND Performed at [facility] [address], [place], [place] [address]    Special Requests   Final    BOTTLES DRAWN AEROBIC AND ANAEROBIC Blood Culture results may not be optimal due to an inadequate volume of blood received in culture bottles Performed at [facility] [address] [place] [address], [place], [place] [address]    Culture   Final    NO GROWTH 3 DAYS Performed at [facility], [place] [address], [place],  [address]    Report Status PENDING  Incomplete         Radiology Studies: No results found.      Scheduled Meds: . amoxicillin  500 mg Oral Q12H  . feeding supplement  1 Container Oral TID BM  . heparin  5,000 Units Subcutaneous Q8H  . latanoprost  1 drop Both Eyes QHS  . loperamide  2 mg Oral BID  . midodrine  2.5 mg Oral TID WC  . mirtazapine  7.5 mg Oral QHS  . ondansetron (ZOFRAN) IV  4 mg Intravenous Q8H  . pravastatin  40 mg Oral q1800  . saccharomyces boulardii  250 mg Oral BID  . sertraline  25 mg Oral Daily   Continuous Infusions: . sodium chloride 75  mL/hr at 10/12/17 0950  . famotidine (PEPCID) IV       LOS: 4 days    Georgette Shell, MD [facility]  If 7PM-7AM, please contact night-coverage [email or web address] Password [email or web address] 10/12/2017, 12:01 PM

## 2017-10-13 ENCOUNTER — Encounter (HOSPITAL_COMMUNITY): Payer: Self-pay | Admitting: Nephrology

## 2017-10-13 DIAGNOSIS — E440 Moderate protein-calorie malnutrition: Secondary | ICD-10-CM

## 2017-10-13 DIAGNOSIS — C8333 Diffuse large B-cell lymphoma, intra-abdominal lymph nodes: Secondary | ICD-10-CM

## 2017-10-13 DIAGNOSIS — A4151 Sepsis due to Escherichia coli [E. coli]: Secondary | ICD-10-CM

## 2017-10-13 DIAGNOSIS — E860 Dehydration: Secondary | ICD-10-CM

## 2017-10-13 DIAGNOSIS — Z905 Acquired absence of kidney: Secondary | ICD-10-CM

## 2017-10-13 DIAGNOSIS — Z6825 Body mass index (BMI) 25.0-25.9, adult: Secondary | ICD-10-CM

## 2017-10-13 LAB — CBC
HEMATOCRIT: 26.2 % — AB (ref 39.0–52.0)
Hemoglobin: 8.3 g/dL — ABNORMAL LOW (ref 13.0–17.0)
MCH: 30.7 pg (ref 26.0–34.0)
MCHC: 31.7 g/dL (ref 30.0–36.0)
MCV: 97 fL (ref 78.0–100.0)
PLATELETS: 295 10*3/uL (ref 150–400)
RBC: 2.7 MIL/uL — AB (ref 4.22–5.81)
RDW: 14.7 % (ref 11.5–15.5)
WBC: 6.8 10*3/uL (ref 4.0–10.5)

## 2017-10-13 LAB — GASTROINTESTINAL PANEL BY PCR, STOOL (REPLACES STOOL CULTURE)

## 2017-10-13 LAB — CULTURE, BLOOD (SINGLE)
CULTURE: NO GROWTH
CULTURE: NO GROWTH
Special Requests: ADEQUATE

## 2017-10-13 LAB — SODIUM, URINE, RANDOM: Sodium, Ur: 95 mmol/L

## 2017-10-13 LAB — BASIC METABOLIC PANEL
ANION GAP: 4 — AB (ref 5–15)
BUN: 27 mg/dL — ABNORMAL HIGH (ref 8–23)
CO2: 15 mmol/L — ABNORMAL LOW (ref 22–32)
Calcium: 9.1 mg/dL (ref 8.9–10.3)
Chloride: 123 mmol/L — ABNORMAL HIGH (ref 98–111)
Creatinine, Ser: 2.41 mg/dL — ABNORMAL HIGH (ref 0.61–1.24)
GFR calc non Af Amer: 23 mL/min — ABNORMAL LOW (ref >60)
GFR, EST AFRICAN AMERICAN: 27 mL/min — AB (ref >60)
Glucose, Bld: 84 mg/dL (ref 70–99)
POTASSIUM: 4 mmol/L (ref 3.5–5.1)
SODIUM: 142 mmol/L (ref 135–145)

## 2017-10-13 MED ORDER — FAMOTIDINE 20 MG PO TABS
20.0000 mg | ORAL_TABLET | Freq: Every day | ORAL | Status: DC
  Administered 2017-10-13 – 2017-10-14 (×2): 20 mg via ORAL
  Filled 2017-10-13 (×2): qty 1

## 2017-10-13 NOTE — Progress Notes (Signed)
These preliminary result these preliminary results were noted.  Awaiting final report.

## 2017-10-13 NOTE — Progress Notes (Signed)
HEMATOLOGY/ONCOLOGY INPATIENT PROGRESS NOTE  Date of Service: 10/13/2017  Inpatient Attending: .Georgette Shell, MD   SUBJECTIVE:   Jonathan Marshall is accompanied today by his wife at bedside. The pt reports that he is doing well overall and is continuing to feel better.   The pt reports that his nausea, vomiting and diarrhea are all beginning to subside and he was able to keep his breakfast down this morning. He notes an overall improvement in his constitution and was able to get out of bed earlier today with PT.   Lab results today (10/13/17) of CBC, BMP is as follows: all values are WNL except for RBC at 2.70, HGB at 8.3, HCT at 26.2, Chloride at 123, CO2 at 15, BUN at 27, Creatinine at 2.41, GFR at 23.  On review of systems, pt reports resolving nausea, resolving vomiting, resolving diarrhea, and denies abdominal pains, leg swelling, fever, chills, and any other symptoms.    OBJECTIVE:  NAD  PHYSICAL EXAMINATION: . Vitals:   10/12/17 0452 10/12/17 1345 10/12/17 2053 10/13/17 0434  BP: 132/78 117/77 121/71 117/74  Pulse: 85 86 65 68  Resp: 18 16 16 16   Temp: 98.7 F (37.1 C) (!) 97.5 F (36.4 C) 97.9 F (36.6 C) 98.1 F (36.7 C)  TempSrc: Oral Oral Oral Oral  SpO2: 98% 99% 98% 100%  Weight:      Height:       Filed Weights   10/09/17 0355 10/10/17 0500 10/11/17 0414  Weight: 191 lb 5.8 oz (86.8 kg) 210 lb 1.6 oz (95.3 kg) 171 lb 4.8 oz (77.7 kg)   .Body mass index is 25.3 kg/m.  GENERAL:alert, appears fatigued but improved,in no acute distress SKIN: skin color, texture, turgor are normal, no rashes or significant lesions EYES: normal, conjunctiva are pink and non-injected, sclera clear OROPHARYNX:no exudate, no erythema and lips, buccal mucosa, and tongue normal  NECK: supple, no JVD, thyroid normal size, non-tender, without nodularity LYMPH:  no palpable lymphadenopathy in the cervical, axillary or inguinal LUNGS: clear to auscultation with normal  respiratory effort HEART: regular rate & rhythm,  no murmurs and no lower extremity edema ABDOMEN: abdomen soft, non-tender, normoactive bowel sounds  Musculoskeletal: no cyanosis of digits and no clubbing  PSYCH: alert & oriented x 3 with fluent speech NEURO: no focal motor/sensory deficits  MEDICAL HISTORY:  Past Medical History:  Diagnosis Date  . BPH (benign prostatic hyperplasia)   . Cancer [facility]) 1993   Colon surgery and chemo , 2-19 left ureteral camcer last chemo tx 08-27-17  . DDD (degenerative disc disease), lumbar    L4-L5  . Depression   . Early stage glaucoma    both eyes  . Hearing loss   . Hematuria, gross   . History of adenomatous polyp of colon   . History of carpal tunnel syndrome    left  . History of colon cancer    dx 1993  s/p  hemicolectomy (malignant tumor x2)  and chemo therapy completed 1993--- no recurrence per pt  . History of shingles   . HOH (hard of hearing)    right ear  . Hyperlipidemia   . Insomnia     SURGICAL HISTORY: Past Surgical History:  Procedure Laterality Date  . APPENDECTOMY    . CARPAL TUNNEL RELEASE Left 08/13/2013   Procedure: CARPAL TUNNEL RELEASE LEFT WRIST;  Surgeon: Yvette Rack., MD;  Location: [facility];  Service: Orthopedics;  Laterality: Left;  . COLONOSCOPY    .  CYSTOSCOPY W/ RETROGRADES Bilateral 04/03/2017   Procedure: CYSTOSCOPY WITH RETROGRADE PYELOGRAM, LEFT URETEROSCOPY, LEFT STENT PLACEMENT;  Surgeon: Franchot Gallo, MD;  Location: [facility];  Service: Urology;  Laterality: Bilateral;  . CYSTOSCOPY W/ URETERAL STENT PLACEMENT Left 04/23/2017   Procedure: CYSTOSCOPY WITH STENT REPLACEMENT;  Surgeon: Alexis Frock, MD;  [facility];  Service: Urology;  Laterality: Left;  . CYSTOSCOPY WITH FULGERATION Left 04/03/2017   Procedure: LEFT URETERAL BIOPSY;  Surgeon: Franchot Gallo, MD;  Location: [facility];  Service: Urology;  Laterality:  Left;  . CYSTOSCOPY/RETROGRADE/URETEROSCOPY Left 04/23/2017   Procedure: CYSTOSCOPY/RETROGRADE/URETEROSCOPY, LEFT  URETEROSCOPY WITY  BIOPSY;  Surgeon: Alexis Frock, MD;  Location: [facility];  Service: Urology;  Laterality: Left;  . HEMICOLECTOMY  1993   malignant tumor x2 , colon cancer and Appendectomy  . INGUINAL HERNIA REPAIR Right 2003  . IR FLUORO GUIDE PORT INSERTION RIGHT  05/30/2017  . IR US GUIDE VASC ACCESS RIGHT  05/30/2017  . MASTOIDECTOMY Right 2015  . ROBOT ASSITED LAPAROSCOPIC NEPHROURETERECTOMY Left 09/24/2017   Procedure: XI ROBOT ASSITED LAPAROSCOPIC NEPHROURETERECTOMY;  Surgeon: Alexis Frock, MD;  Location: [facility];  Service: Urology;  Laterality: Left;  . ROBOTIC ASSISTED LAPAROSCOPIC LYSIS OF ADHESION  09/24/2017   Procedure: XI ROBOTIC ASSISTED LAPAROSCOPIC EXTENSIVE LYSIS OF ADHESION;  Surgeon: Alexis Frock, MD;  Location: [facility];  Service: Urology;;  . Lonell Face  last one 06/ 2018  . TRANSURETHRAL RESECTION OF PROSTATE N/A 09/02/2016   Procedure: TRANSURETHRAL RESECTION OF THE PROSTATE (TURP);  Surgeon: Franchot Gallo, MD;  Location: [facility];  Service: Urology;  Laterality: N/A;    SOCIAL HISTORY: Social History   Socioeconomic History  . Marital status: Married    Spouse name: Not on file  . Number of children: Not on file  . Years of education: Not on file  . Highest education level: Not on file  Occupational History  . Not on file  Social Needs  . Financial resource strain: Not on file  . Food insecurity:    Worry: Not on file    Inability: Not on file  . Transportation needs:    Medical: Not on file    Non-medical: Not on file  Tobacco Use  . Smoking status: Never Smoker  . Smokeless tobacco: Never Used  Substance and Sexual Activity  . Alcohol use: Yes    Alcohol/week: 1.0 standard drinks    Types: 1 Glasses of wine per week    Comment: rare  . Drug use: Never  . Sexual activity: Not Currently    Lifestyle  . Physical activity:    Days per week: Not on file    Minutes per session: Not on file  . Stress: Not on file  Relationships  . Social connections:    Talks on phone: Not on file    Gets together: Not on file    Attends religious service: Not on file    Active member of club or organization: Not on file    Attends meetings of clubs or organizations: Not on file    Relationship status: Not on file  . Intimate partner violence:    Fear of current or ex partner: Not on file    Emotionally abused: Not on file    Physically abused: Not on file    Forced sexual activity: Not on file  Other Topics Concern  . Not on file  Social History Narrative  . Not on file  FAMILY HISTORY: History reviewed. No pertinent family history.  ALLERGIES:  has No Known Allergies.  MEDICATIONS:  Scheduled Meds: . amoxicillin  500 mg Oral Q12H  . famotidine  20 mg Oral Daily  . feeding supplement  1 Container Oral TID BM  . heparin  5,000 Units Subcutaneous Q8H  . latanoprost  1 drop Both Eyes QHS  . loperamide  2 mg Oral BID  . midodrine  2.5 mg Oral TID WC  . mirtazapine  7.5 mg Oral QHS  . ondansetron (ZOFRAN) IV  4 mg Intravenous Q8H  . pravastatin  40 mg Oral q1800  . saccharomyces boulardii  250 mg Oral BID  . sertraline  25 mg Oral Daily   Continuous Infusions: . sodium chloride 75 mL/hr at 10/13/17 1128   PRN Meds:.acetaminophen **OR** acetaminophen, HYDROcodone-acetaminophen, loperamide, LORazepam, ondansetron **OR** ondansetron (ZOFRAN) IV  REVIEW OF SYSTEMS:    10 Point review of Systems was done is negative except as noted above.   LABORATORY DATA:  I have reviewed the data as listed  . CBC Latest Ref Rng & Units 10/13/2017 10/12/2017 10/11/2017  WBC 4.0 - 10.5 K/uL 6.8 7.4 7.4  Hemoglobin 13.0 - 17.0 g/dL 8.3(L) 9.2(L) 9.0(L)  Hematocrit 39.0 - 52.0 % 26.2(L) 28.5(L) 27.7(L)  Platelets 150 - 400 K/uL 295 381 405(H)    . CMP Latest Ref Rng & Units 10/13/2017  10/12/2017 10/11/2017  Glucose 70 - 99 mg/dL 84 95 108(H)  BUN 8 - 23 mg/dL 27(H) 32(H) 42(H)  Creatinine 0.61 - 1.24 mg/dL 2.41(H) 2.32(H) 2.44(H)  Sodium 135 - 145 mmol/L 142 142 142  Potassium 3.5 - 5.1 mmol/L 4.0 3.8 3.9  Chloride 98 - 111 mmol/L 123(H) 122(H) 122(H)  CO2 22 - 32 mmol/L 15(L) 15(L) 15(L)  Calcium 8.9 - 10.3 mg/dL 9.1 9.2 9.4  Total Protein 6.5 - 8.1 g/dL - - -  Total Bilirubin 0.3 - 1.2 mg/dL - - -  Alkaline Phos 38 - 126 U/L - - -  AST 15 - 41 U/L - - -  ALT 0 - 44 U/L - - -       RADIOGRAPHIC STUDIES: I have personally reviewed the radiological images as listed and agreed with the findings in the report. US Renal  Result Date: 10/08/2017 CLINICAL DATA:  Acute kidney injury EXAM: RENAL / URINARY TRACT ULTRASOUND COMPLETE COMPARISON:  PET-CT 07/28/2017 and previous FINDINGS: Right Kidney: Length: 12.5. 5.1 x 5 x 4.8 cm cyst from the upper pole. Parenchyma echogenicity normal. No hydronephrosis. Left Kidney: Surgically absent Bladder: Incompletely distended, unremarkable. IMPRESSION: 1. Interval left nephrectomy. 2. Stable right renal cyst. 3. No hydronephrosis. Electronically Signed   By: Lucrezia Europe M.D.   On: 10/08/2017 18:36    ASSESSMENT & PLAN:  84 y.o. male with  1. Recently diagnosed Diffuse Large B Cell Lymphoma - Stage II -bulky  05/26/17 PET/CT which showed 1. 5 cm right-sided retroperitoneal nodal mass is hypermetabolic and consistent with known lymphoma. Small lymph node just below the aortic bifurcation is also hypermetabolic. -ECHO shows normal EF.  S/p 4 cycles of R-mini CHOP 07/28/17 PET/CT which revealed Significant response to interval therapy. The large retroperitoneal mass is significantly smaller with minimal residual activity, slightly greater than blood pool (Deauville 3). No evidence of disease progression.  -Recommend Ensure/boost for nutrition supplement  PLAN: -we will consider the role of RT after rpt imaging 6-8 weeks post op  2.  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  Patient is s/p laparoscopic nephroureterectomy on 09/24/2017  Pathology noted below.  PLAN --continue urologic f/u with Dr Tresa Moore. -reviewed pathology - will f/u in clinic 3. Acute UTI with E.coli with sepsis and dehydration 4. AKI on CKD- single kidney. Creatinine improved from creatinine of 4.44 on admission 5. Protein calorie malnutrition 6. Diarrhea - neg GI panel -- resolving PLAN -Encouraged the pt to eat very well and stay hydrated . Consider nutritional consultation. -Defer to hospital medicine doctor for IVF, Abx -consider PRBC transfusion to maintain hgb >8-8.5 and to optimize functional status post-op -PT/OT evaluation. Home with HHS vs SNF  Outpatient f/u with Dr Irene Limbo in 2 weeks post discharge with rpt labs. Will reschedule appointment from 8/22  The total time spent in the appt was 35 minutes and more than 50% was on counseling and direct patient cares.     Sullivan Lone MD MS AAHIVMS [facility] Hematology/Oncology Physician [facility]  (Office):       [phone] (Work cell):  [phone] (Fax):           [phone]  10/13/2017 1:43 PM  I, Baldwin Jamaica, am acting as a scribe for Dr. Irene Limbo  .I have reviewed the above documentation for accuracy and completeness, and I agree with the above. Sullivan Lone MD MS

## 2017-10-13 NOTE — Progress Notes (Signed)
Physical Therapy Treatment Patient Details Name: Jonathan Marshall MRN: [id number] DOB: 1932-04-06 Today's Date: 10/13/2017    History of Present Illness pt was admitted for weakness lethargy and nausea/vomiting.  Found to have acute kidney injury superimposed on chronic kidney disease.  s/p nephroureterectomy on 7/31    PT Comments    Assisted OOB to amb a great distance in hallway.  Definite need for walk due to mild instability.  Pt agrees to use walker when initially D/C to home.    Follow Up Recommendations  Home health PT     Equipment Recommendations  None recommended by PT(has a walker at home)    Recommendations for Other Services       Precautions / Restrictions Precautions Precautions: Fall Restrictions Weight Bearing Restrictions: No    Mobility  Bed Mobility Overal bed mobility: Modified Independent             General bed mobility comments: increased time  Transfers Overall transfer level: Needs assistance Equipment used: Rolling walker (2 wheeled) Transfers: Sit to/from Stand Sit to Stand: Min guard         General transfer comment: for safety; cues for UE placement  Ambulation/Gait Ambulation/Gait assistance: Min guard Gait Distance (Feet): 185 Feet Assistive device: Rolling walker (2 wheeled) Gait Pattern/deviations: Step-through pattern Gait velocity: decreased   General Gait Details: steady with walker and unsteady with out.  Pt plans to amb with walker initially when D/C to home.     Stairs             Wheelchair Mobility    Modified Rankin (Stroke Patients Only)       Balance                                            Cognition Arousal/Alertness: Awake/alert Behavior During Therapy: WFL for tasks assessed/performed Overall Cognitive Status: Within Functional Limits for tasks assessed                                        Exercises      General Comments        Pertinent  Vitals/Pain Pain Assessment: No/denies pain    Home Living                      Prior Function            PT Goals (current goals can now be found in the care plan section) Progress towards PT goals: Progressing toward goals    Frequency    Min 3X/week      PT Plan Current plan remains appropriate    Co-evaluation              AM-PAC PT "6 Clicks" Daily Activity  Outcome Measure  Difficulty turning over in bed (including adjusting bedclothes, sheets and blankets)?: None Difficulty moving from lying on back to sitting on the side of the bed? : None Difficulty sitting down on and standing up from a chair with arms (e.g., wheelchair, bedside commode, etc,.)?: A Little Help needed moving to and from a bed to chair (including a wheelchair)?: A Little Help needed walking in hospital room?: A Little Help needed climbing 3-5 steps with a railing? : A Little 6 Click Score: 20  End of Session Equipment Utilized During Treatment: Gait belt Activity Tolerance: Patient tolerated treatment well Patient left: in chair;with call bell/phone within reach;with chair alarm set Nurse Communication: Mobility status PT Visit Diagnosis: Unsteadiness on feet (R26.81)     Time: 1002-1020 PT Time Calculation (min) (ACUTE ONLY): 18 min  Charges:  $Gait Training: 8-22 mins                     Rica Koyanagi  PTA [facility]  Acute  Rehab Pager      [phone]

## 2017-10-13 NOTE — Progress Notes (Signed)
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 Patient recently underwent a left nephroureterectomy on 09/24/2017 and since then he has been feeling more fatigued and weak. Recently had his Foley catheter removed on Monday by urology. Patient states that he has had some nausea vomiting ever since his surgery andstates that his appetite is been extremely poor because he vomits his food up almost every several time. He also states that he has not been drinking much fluids. Patient felt dizzy upon standing and went to go see his oncologist today who checked some blood work and a urine analysis which showed likely UTI. They obtained a blood culture and started the patient on empiric IV Zosyn. Because his kidney function showed a creatinine of 4.4 he was directed to the emergency room for further evaluation.TR[facility] was called to admit this patient for acute kidney injury on chronic kidney disease along with a suspected urinary tract infection  ED Course:Had a liter of normal saline bolused and had a repeat blood culture and urine culture added on.  Assessment & Plan:   Active Problems:   Enlarged prostate with urinary obstruction   Urothelial carcinoma (HCC)   Acute kidney injury ([place])   Acute urinary tract infection   Generalized weakness   Normocytic anemia   Glaucoma   HLD (hyperlipidemia)   Depression   Malnutrition of moderate degree 1]AKI on CKD stage III renal functions were  getting better with IV hydration though still high.  Creatinine up to 2.41 today from 2.3 to yesterday. Patient has  solitary kidney. Continue IV hydration. Hyperchloremic due to diarrhea.Follow-up renal ultrasoundshows no hydronephrosis, stable right renal cyst, left nephrectomy.Patient has very poor p.o. intake with decreased appetite and also has chronic diarrhea. Patient reports having recent surgery for urethral cancer and nephrectomy July 31 of 2019. HeSTILLappear to be dehydrated and needs IV hydration.  Patient denies any diarrhea overnight.  Nausea better.  Continue Imodium, Florastor, IV fluids.  Patient has history of chronic diarrhea since 1993 when he had colon  Surgery for colon cancer however his stool has never been green.  Check stool studies.  Start PPI follow tomorrow.  2]complicated E. coli UTI status post recent catheter placement and removal.Continue antibiotics.   3]normocytic anemia drop in hemoglobin may have had some hemodilution with IV fluids.Stable and improved at 9.1.  4]insomnia patient requesting something to sleep at night.Starting patient on Remeron to increase appetite which will also help with the sleep. DC trazodone.   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  6]depression continue sertraline.    DVT prophylaxis: Heparin Code Status: Do not resuscitate Family Communication discussed with wife Disposition Plan: We will plan discharge tomorrow as long as he is not orthostatic with improved renal functions.   Consultants: nephrology   Procedures none Antimicrobials:none  Subjective: Denies any diarrhea  Objective: Vitals:   10/12/17 0452 10/12/17 1345 10/12/17 2053 10/13/17 0434  BP: 132/78 117/77 121/71 117/74  Pulse: 85 86 65 68  Resp: 18 16 16 16   Temp: 98.7 F (37.1 C) (!) 97.5 F (36.4 C) 97.9 F (36.6 C)  98.1 F (36.7 C)  TempSrc: Oral Oral Oral Oral  SpO2: 98% 99% 98% 100%  Weight:      Height:         Intake/Output Summary (Last 24 hours) at 10/13/2017 1342 Last data filed at 10/13/2017 0700 Gross per 24 hour  Intake 1836.51 ml  Output 250 ml  Net 1586.51 ml   Filed Weights   10/09/17 0355 10/10/17 0500 10/11/17 0414  Weight: 86.8 kg 95.3 kg 77.7 kg    Examination:oral mucosa dry  General exam: Appears calm and comfortable  Respiratory system: Clear to auscultation. Respiratory effort normal. Cardiovascular system: S1 & S2 heard, RRR. No JVD, murmurs, rubs, gallops or clicks. No pedal edema. Gastrointestinal system: Abdomen is nondistended, soft and nontender. No organomegaly or masses felt. Normal bowel sounds heard. Central nervous system: Alert and oriented. No focal neurological deficits. Extremities: Symmetric 5 x 5 power. Skin: No rashes, lesions or ulcers Psychiatry: Judgement and insight appear normal. Mood & affect appropriate.     Data Reviewed: I have personally reviewed following labs and imaging studies  CBC: Recent Labs  Lab 10/08/17 0857  10/09/17 0755 10/10/17 0555 10/11/17 0610 10/12/17 0545 10/13/17 0500  WBC 13.7*   < > 8.8 9.7 7.4 7.4 6.8  NEUTROABS 10.4*  --  6.5  --   --   --   --   HGB 10.1*   < > 8.5* 9.1* 9.0* 9.2* 8.3*  HCT 30.7*   < > 25.4* 28.4* 27.7* 28.5* 26.2*  MCV 92.5   < > 92.4 94.0 94.9 95.6 97.0  PLT 415*   < > 432* 468* 405* 381 295   < > = values in this interval not displayed.   Basic Metabolic Panel: Recent Labs  Lab 10/08/17 0857  10/08/17 1540 10/09/17 0755 10/10/17 0555 10/11/17 0610 10/12/17 0545 10/13/17 0500  NA 136  --   --  139 141 142 142 142  K 3.7  --   --  3.3* 3.7 3.9 3.8 4.0  CL 106  --   --  115* 118* 122* 122* 123*  CO2 12*  --   --  15* 15* 15* 15* 15*  GLUCOSE 107*  --   --  93 105* 108* 95 84  BUN 82*  --   --  70* 55* 42* 32* 27*  CREATININE 4.40*   < > 4.07* 3.61* 2.99* 2.44* 2.32* 2.41*  CALCIUM 10.0  --   --  8.9 9.4 9.4 9.2 9.1  MG 2.4  --  2.3 2.2  --   --   --   --   PHOS  --   --   5.2* 4.9*  --   --   --   --    < > = values in this interval not displayed.   GFR: Estimated Creatinine Clearance: 23.2 mL/min (A) (by C-G formula based on SCr of 2.41 mg/dL (H)). Liver Function Tests: Recent Labs  Lab 10/08/17 0857 10/09/17 0755  AST 33 21  ALT 39 28  ALKPHOS 93 65  BILITOT 0.3 0.3  PROT 7.3 6.1*  ALBUMIN 3.2* 2.9*   No results for input(s): LIPASE, AMYLASE in the last 168 hours. No results for input(s): AMMONIA in the last 168 hours. Coagulation Profile: No results for input(s): INR, PROTIME in the last 168 hours. Cardiac Enzymes: No results for input(s): CKTOTAL, CKMB, CKMBINDEX, TROPONINI in the last 168 hours. BNP (last 3 results) No results for input(s): PROBNP in the last  8760 hours. HbA1C: No results for input(s): HGBA1C in the last 72 hours. CBG: Recent Labs  Lab 10/09/17 0722 10/10/17 0745 10/11/17 0733 10/12/17 0733  GLUCAP 115* 95 122* 88   Lipid Profile: No results for input(s): CHOL, HDL, LDLCALC, TRIG, CHOLHDL, LDLDIRECT in the last 72 hours. Thyroid Function Tests: No results for input(s): TSH, T4TOTAL, FREET4, T3FREE, THYROIDAB in the last 72 hours. Anemia Panel: No results for input(s): VITAMINB12, FOLATE, FERRITIN, TIBC, IRON, RETICCTPCT in the last 72 hours. Sepsis Labs: Recent Labs  Lab 10/08/17 1147  LATICACIDVEN 1.17    Recent Results (from the past 240 hour(s))  Culture, Blood     Status: None   Collection Time: 10/08/17 10:30 AM  Result Value Ref Range Status   Specimen Description BLOOD PORTA CATH  Final   Special Requests   Final    BOTTLES DRAWN AEROBIC AND ANAEROBIC Blood Culture adequate volume   Culture   Final    NO GROWTH 5 DAYS Performed at [facility], [address], [place], [place] [address]    Report Status 10/13/2017 FINAL  Final  Urine culture     Status: Abnormal   Collection Time: 10/08/17 12:47 PM  Result Value Ref Range Status   Specimen Description   Final    URINE,  RANDOM Performed at [facility] [address] [place], [place] [address]    Special Requests   Final    NONE Performed at [facility], [place] [address] [place], [place] [address]    Culture >=100,000 COLONIES/mL ESCHERICHIA COLI (A)  Final   Report Status 10/10/2017 FINAL  Final   Organism ID, Bacteria ESCHERICHIA COLI (A)  Final      Susceptibility   Escherichia coli - MIC*    AMPICILLIN <=2 SENSITIVE Sensitive     CEFAZOLIN <=4 SENSITIVE Sensitive     CEFTRIAXONE <=1 SENSITIVE Sensitive     CIPROFLOXACIN <=0.25 SENSITIVE Sensitive     GENTAMICIN <=1 SENSITIVE Sensitive     IMIPENEM <=0.25 SENSITIVE Sensitive     NITROFURANTOIN <=16 SENSITIVE Sensitive     TRIMETH/SULFA <=20 SENSITIVE Sensitive     AMPICILLIN/SULBACTAM <=2 SENSITIVE Sensitive     PIP/TAZO <=4 SENSITIVE Sensitive     Extended ESBL NEGATIVE Sensitive     * >=100,000 COLONIES/mL ESCHERICHIA COLI  Culture, blood (single) w Reflex to ID Panel     Status: None   Collection Time: 10/08/17  1:11 PM  Result Value Ref Range Status   Specimen Description   Final    BLOOD RIGHT HAND Performed at [facility] [address] [place], [place] [address]    Special Requests   Final    BOTTLES DRAWN AEROBIC AND ANAEROBIC Blood Culture results may not be optimal due to an inadequate volume of blood received in culture bottles Performed at [facility] [address], [place], [place] [address]    Culture   Final    NO GROWTH 5 DAYS Performed at [facility], [place] [address], [place], [place] [address]    Report Status 10/13/2017 FINAL  Final         Radiology Studies: No results found.      Scheduled Meds: . amoxicillin  500 mg Oral Q12H  . famotidine  20 mg Oral Daily  . feeding supplement  1 Container Oral TID BM  . heparin  5,000 Units Subcutaneous Q8H  . latanoprost  1 drop Both Eyes  QHS  . loperamide  2 mg Oral BID   . midodrine  2.5 mg Oral TID WC  . mirtazapine  7.5 mg Oral QHS  . ondansetron (ZOFRAN) IV  4 mg Intravenous Q8H  . pravastatin  40 mg Oral q1800  . saccharomyces boulardii  250 mg Oral BID  . sertraline  25 mg Oral Daily   Continuous Infusions: . sodium chloride 75 mL/hr at 10/13/17 1128     LOS: 5 days     Georgette Shell, MD [facility]  If 7PM-7AM, please contact night-coverage [email or web address] Password [email or web address] 10/13/2017, 1:42 PM

## 2017-10-13 NOTE — Progress Notes (Signed)
Key Points: Use following P&T approved IV to PO non-antibiotic change policy.  Description contains the criteria that are approved Note: Policy Excludes:  Esophagectomy patientsPHARMACIST - PHYSICIAN COMMUNICATION DR:   Zigmund Daniel CONCERNING: IV to Oral Route Change Policy  RECOMMENDATION: This patient is receiving pepcid by the intravenous route.  Based on criteria approved by the Pharmacy and Therapeutics Committee, the intravenous medication(s) is/are being converted to the equivalent oral dose form(s).   DESCRIPTION: These criteria include:  The patient is eating (either orally or via tube) and/or has been taking other orally administered medications for a least 24 hours  The patient has no evidence of active gastrointestinal bleeding or impaired GI absorption (gastrectomy, short bowel, patient on TNA or NPO).  If you have questions about this conversion, please contact the Pharmacy Department  []   [phone] )  Jonathan Marshall []   [phone] )  Jonathan Marshall  []   [phone] )  [facility] [x]   [phone] )  Jonathan Marshall, Jonathan Marshall, Jonathan Marshall 10/13/2017 8:57 AM

## 2017-10-13 NOTE — Care Management Important Message (Addendum)
Important Message  Patient Details IM Letter given to Nora/Case Manager to present to the Patient Name: Jonathan Marshall MRN: [id number] Date of Birth: 1932-02-26   Medicare Important Message Given:  Yes    Kerin Salen 10/13/2017, 11:13 AMImportant Message  Patient Details  Name: Jonathan Marshall MRN: [id number] Date of Birth: 1932-05-26   Medicare Important Message Given:  Yes    Kerin Salen 10/13/2017, 11:13 AM

## 2017-10-13 NOTE — Consult Note (Signed)
Renal Service Consult Note Jonathan Marshall 10/13/2017 [place] Requesting Physician:  Dr Rodena Piety  Reason for Consult:  Renal failure HPI: The patient is a 84 y.o. year-old w/ hist of colon cancer, depression, glaucoma, sp TURP, recent L nephrectomy presented to ED w/ c/o fatigue, and abnormal labs from cancer center w creat up at 4.4.  Hx ureteral cancer sp L nephro-ureterectomy recently.  Creat improved from 4.4 down to 2.5 w/ IVF's over the last several days.  Asked to see for renal failure.    Pt had n/v/diarrhea at home, diarrhea is slowing down here, none in the past 24 hrs.  Vomiting is better. No appetite. No hx of kidney disease or kidney failure per pateint.      No acei/ ARB/ nsaids/ contrast.    Inpatient meds:   - amoxicillin , famotidiine, heparin, eyedrops, loperamide, midodrine 2.5 mg tid, mirtazapine 7.'5mg'$  qd, zofran '4mg'$  tid , pravastatin 40 qd, saccharomyces boulardii 250 mg bid, sertraline 25 qd, IV Rocephin, IV famotidine, prn ondansetron, hydrocodone- aceta 5/-325 q 4h prn    ROS  denies CP  no joint pain   no HA  no blurry vision  no rash  no dysuria  no difficulty voiding  no change in urine color    Past Medical History  Past Medical History:  Diagnosis Date  . BPH (benign prostatic hyperplasia)   . Cancer [facility]) 1993   Colon surgery and chemo , 2-19 left ureteral camcer last chemo tx 08-27-17  . DDD (degenerative disc disease), lumbar    L4-L5  . Depression   . Early stage glaucoma    both eyes  . Hearing loss   . Hematuria, gross   . History of adenomatous polyp of colon   . History of carpal tunnel syndrome    left  . History of colon cancer    dx 1993  s/p  hemicolectomy (malignant tumor x2)  and chemo therapy completed 1993--- no recurrence per pt  . History of shingles   . HOH (hard of hearing)    right ear  . Hyperlipidemia   . Insomnia    Past Surgical History  Past Surgical History:  Procedure  Laterality Date  . APPENDECTOMY    . CARPAL TUNNEL RELEASE Left 08/13/2013   Procedure: CARPAL TUNNEL RELEASE LEFT WRIST;  Surgeon: Yvette Rack., MD;  Location: [facility];  Service: Orthopedics;  Laterality: Left;  . COLONOSCOPY    . CYSTOSCOPY W/ RETROGRADES Bilateral 04/03/2017   Procedure: CYSTOSCOPY WITH RETROGRADE PYELOGRAM, LEFT URETEROSCOPY, LEFT STENT PLACEMENT;  Surgeon: Franchot Gallo, MD;  Location: [facility];  Service: Urology;  Laterality: Bilateral;  . CYSTOSCOPY W/ URETERAL STENT PLACEMENT Left 04/23/2017   Procedure: CYSTOSCOPY WITH STENT REPLACEMENT;  Surgeon: Alexis Frock, MD;  [facility];  Service: Urology;  Laterality: Left;  . CYSTOSCOPY WITH FULGERATION Left 04/03/2017   Procedure: LEFT URETERAL BIOPSY;  Surgeon: Franchot Gallo, MD;  Location[facility]  Service: Urology;  Laterality: Left;  . CYSTOSCOPY/RETROGRADE/URETEROSCOPY Left 04/23/2017   Procedure: CYSTOSCOPY/RETROGRADE/URETEROSCOPY, LEFT  URETEROSCOPY WITY  BIOPSY;  Surgeon: Alexis Frock, MD;  Location[facility];  Service: Urology;  Laterality: Left;  . HEMICOLECTOMY  1993   malignant tumor x2 , colon cancer and Appendectomy  . INGUINAL HERNIA REPAIR Right 2003  . IR FLUORO GUIDE PORT INSERTION RIGHT  05/30/2017  . IR US GUIDE VASC ACCESS RIGHT  05/30/2017  . MASTOIDECTOMY Right 2015  . ROBOT ASSITED LAPAROSCOPIC NEPHROURETERECTOMY Left 09/24/2017   Procedure: XI ROBOT ASSITED LAPAROSCOPIC NEPHROURETERECTOMY;  Surgeon: Alexis Frock, MD;  Location: [facility];  Service: Urology;  Laterality: Left;  . ROBOTIC ASSISTED LAPAROSCOPIC LYSIS OF ADHESION  09/24/2017   Procedure: XI ROBOTIC ASSISTED LAPAROSCOPIC EXTENSIVE LYSIS OF ADHESION;  Surgeon: Alexis Frock, MD;  Location: [facility];  Service: Urology;;  . Lonell Face  last one 06/ 2018  . TRANSURETHRAL RESECTION OF PROSTATE N/A 09/02/2016   Procedure: TRANSURETHRAL  RESECTION OF THE PROSTATE (TURP);  Surgeon: Franchot Gallo, MD;  Location: [facility];  Service: Urology;  Laterality: N/A;   Family History History reviewed. No pertinent family history. Social History  reports that he has never smoked. He has never used smokeless tobacco. He reports that he drinks about 1.0 standard drinks of alcohol per week. He reports that he does not use drugs. Allergies No Known Allergies Home medications Prior to Admission medications   Medication Sig Start Date End Date Taking? Authorizing Provider  diphenhydramine-acetaminophen (TYLENOL PM) 25-500 MG TABS tablet Take 2 tablets by mouth at bedtime.    Yes [provider]  HYDROcodone-acetaminophen (NORCO/VICODIN) 5-325 MG tablet Take 1 tablet by mouth every 4 (four) hours as needed for moderate pain. 09/26/17 09/26/18 Yes Wood, Case M, MD  latanoprost (XALATAN) 0.005 % ophthalmic solution Place 1 drop into both eyes at bedtime.   Yes [provider]  LORazepam (ATIVAN) 0.5 MG tablet Take 1 tablet (0.5 mg total) by mouth every 6 (six) hours as needed (Nausea or vomiting). 06/03/17  Yes Brunetta Genera, MD  lovastatin (MEVACOR) 40 MG tablet Take 40 mg by mouth every evening.   Yes [provider]  sertraline (ZOLOFT) 25 MG tablet Take 25 mg by mouth every morning.    Yes [provider]  lidocaine-prilocaine (EMLA) cream Apply 1 application topically as needed. For use one hour prior to port access. Place quarter-size amount over port and cover with press-n-seal. Patient not taking: Reported on 10/08/2017 06/03/17   Brunetta Genera, MD  ondansetron (ZOFRAN) 8 MG tablet Take 1 tablet (8 mg total) by mouth 2 (two) times daily as needed for refractory nausea / vomiting. Start on day 3 after cytoxan chemorx Patient not taking: Reported on 09/15/2017 06/03/17   Brunetta Genera, MD  predniSONE (DELTASONE) 20 MG tablet Take 3 tablets (60 mg total) by mouth daily. Take on  days 1-5 of chemotherapy. Patient not taking: Reported on 09/15/2017 06/03/17   Brunetta Genera, MD  prochlorperazine (COMPAZINE) 10 MG tablet Take 1 tablet (10 mg total) by mouth every 6 (six) hours as needed (Nausea or vomiting). Patient not taking: Reported on 09/15/2017 06/03/17   Brunetta Genera, MD   Liver Function Tests Recent Labs  Lab 10/08/17 [phone] 10/09/17 0755  AST 33 21  ALT 39 28  ALKPHOS 93 65  BILITOT 0.3 0.3  PROT 7.3 6.1*  ALBUMIN 3.2* 2.9*   No results for input(s): LIPASE, AMYLASE in the last 168 hours. CBC Recent Labs  Lab 10/08/17 0857  10/09/17 0755  10/11/17 0610 10/12/17 0545 10/13/17 0500  WBC 13.7*   < > 8.8   < > 7.4 7.4 6.8  NEUTROABS 10.4*  --  6.5  --   --   --   --   HGB 10.1*   < > 8.5*   < > 9.0* 9.2* 8.3*  HCT 30.7*   < > 25.4*   < >  27.7* 28.5* 26.2*  MCV 92.5   < > 92.4   < > 94.9 95.6 97.0  PLT 415*   < > 432*   < > 405* 381 295   < > = values in this interval not displayed.   Basic Metabolic Panel Recent Labs  Lab 10/08/17 0857 10/08/17 1540 10/09/17 0755 10/10/17 0555 10/11/17 0610 10/12/17 0545 10/13/17 0500  NA 136  --  139 141 142 142 142  K 3.7  --  3.3* 3.7 3.9 3.8 4.0  CL 106  --  115* 118* 122* 122* 123*  CO2 12*  --  15* 15* 15* 15* 15*  GLUCOSE 107*  --  93 105* 108* 95 84  BUN 82*  --  70* 55* 42* 32* 27*  CREATININE 4.40* 4.07* 3.61* 2.99* 2.44* 2.32* 2.41*  CALCIUM 10.0  --  8.9 9.4 9.4 9.2 9.1  PHOS  --  5.2* 4.9*  --   --   --   --    Iron/TIBC/Ferritin/ %Sat No results found for: IRON, TIBC, FERRITIN, IRONPCTSAT  Vitals:   10/12/17 0452 10/12/17 1345 10/12/17 2053 10/13/17 0434  BP: 132/78 117/77 121/71 117/74  Pulse: 85 86 65 68  Resp: '18 16 16 16  '$ Temp: 98.7 F (37.1 C) (!) 97.5 F (36.4 C) 97.9 F (36.6 C) 98.1 F (36.7 C)  TempSrc: Oral Oral Oral Oral  SpO2: 98% 99% 98% 100%  Weight:      Height:       Exam Gen alert, no distress, elderly WM No rash, cyanosis or gangrene Sclera  anicteric, throat clear  No jvd or bruits Chest clear bilat to bases RRR no MRG Abd soft ntnd no mass or ascites +bs GU normal male  MS no joint effusions or deformity Ext no LE or UE edema, no wounds or ulcers Neuro is alert, Ox 3 , nf    Home meds:  - hydrocodone- aceta 5-325 hs prn/ lorazepam 0.'5mg'$  qid prn  - sertraline 25 qam  - lovastatin 40 hs/ prn phenergan/ prn zofran  UA 8/14 > large LE/ neg nit/ prot 100/ > 50 wbc and rbc/hpf, many bact Renal US 8/14 > R kidney 12.5 cm, no hydro // L kidney surg absent UCx + > 100,000 EColi   Impression: 1  AKI on CKD IIIB - pt had L nephrectomy < 2 wks ago, prior to that his baseline creatinine was 1.4- 1.8 in 2019 w/ a baseline eGFR of 30- 40 ml/min.  Creat here peaked at 4.4 and is leveling off at 2.3- 2.5 after 8 L IVF's.   I suspect this is his new baseline. The AKI element was due to vomiting / diarrhea and hypovolemia, this has resolved.  Pt has urology f/u.  They will refer as needed. Avoid nsaids/ acei/ ARB in this patient in general.  No other suggestions, will sign off.  2  Ureteral cancer - sp recent L nephroureterectomy July 31 3  EColi UTI 4  HL 5  N/V/D - per primary  Plan - as above     Kelly Splinter MD [employer] pager [phone]   10/13/2017, 1:40 PM

## 2017-10-14 LAB — BASIC METABOLIC PANEL
ANION GAP: 5 (ref 5–15)
BUN: 22 mg/dL (ref 8–23)
CHLORIDE: 123 mmol/L — AB (ref 98–111)
CO2: 14 mmol/L — AB (ref 22–32)
Calcium: 9 mg/dL (ref 8.9–10.3)
Creatinine, Ser: 2.37 mg/dL — ABNORMAL HIGH (ref 0.61–1.24)
GFR calc non Af Amer: 24 mL/min — ABNORMAL LOW (ref >60)
GFR, EST AFRICAN AMERICAN: 28 mL/min — AB (ref >60)
Glucose, Bld: 90 mg/dL (ref 70–99)
Potassium: 3.7 mmol/L (ref 3.5–5.1)
SODIUM: 142 mmol/L (ref 135–145)

## 2017-10-14 LAB — GLUCOSE, CAPILLARY: Glucose-Capillary: 85 mg/dL (ref 70–99)

## 2017-10-14 MED ORDER — SACCHAROMYCES BOULARDII 250 MG PO CAPS: 250.0000 mg | ORAL_CAPSULE | Freq: Two times a day (BID) | ORAL | 0 refills | Status: DC

## 2017-10-14 MED ORDER — MIRTAZAPINE 15 MG PO TBDP: 7.5000 mg | ORAL_TABLET | Freq: Every day | ORAL | 0 refills | Status: DC

## 2017-10-14 MED ORDER — MIDODRINE HCL 2.5 MG PO TABS: 2.5000 mg | ORAL_TABLET | Freq: Two times a day (BID) | ORAL | 0 refills | Status: DC

## 2017-10-14 MED ORDER — HEPARIN SOD (PORK) LOCK FLUSH 100 UNIT/ML IV SOLN
500.0000 [IU] | INTRAVENOUS | Status: AC | PRN
  Administered 2017-10-14: 500 [IU]
  Filled 2017-10-14: qty 5

## 2017-10-14 MED ORDER — LOPERAMIDE HCL 2 MG PO CAPS: 2.0000 mg | ORAL_CAPSULE | Freq: Four times a day (QID) | ORAL | 0 refills | Status: DC | PRN

## 2017-10-14 NOTE — Progress Notes (Signed)
Discharge instructions reviewed with patient and wife.  Both verbalized understanding.

## 2017-10-14 NOTE — Progress Notes (Signed)
Physical Therapy Treatment Patient Details Name: Jonathan Marshall MRN: [id number] DOB: 01/25/1933 Today's Date: 10/14/2017    History of Present Illness pt was admitted for weakness lethargy and nausea/vomiting.  Found to have acute kidney injury superimposed on chronic kidney disease.  s/p nephroureterectomy on 7/31    PT Comments    Assisted out of recliner to amb to bathroom with walker.  Assisted in bathroom then back to bed per pt request to rest prior to D/C.    Follow Up Recommendations  Home health PT     Equipment Recommendations  None recommended by PT    Recommendations for Other Services       Precautions / Restrictions Precautions Precautions: Fall Precaution Comments: incontinent of BM at times-wears pull ups Restrictions Weight Bearing Restrictions: No    Mobility  Bed Mobility Overal bed mobility: Modified Independent             General bed mobility comments: increased time  Transfers Overall transfer level: Needs assistance Equipment used: Rolling walker (2 wheeled) Transfers: Sit to/from [employer] Transfers Sit to Stand: Supervision Stand pivot transfers: Supervision       General transfer comment: for safety; cues for UE placement  also assisted with toilet transfer   Ambulation/Gait Ambulation/Gait assistance: Supervision Gait Distance (Feet): 30 Feet Assistive device: Rolling walker (2 wheeled) Gait Pattern/deviations: Step-through pattern Gait velocity: decreased   General Gait Details: assisted with amb to and from bathroom with walker due to instability/weakness   Stairs             Wheelchair Mobility    Modified Rankin (Stroke Patients Only)       Balance Overall balance assessment: History of Falls;Needs assistance Sitting-balance support: Feet supported;No upper extremity supported Sitting balance-Leahy Scale: Good     Standing balance support: During functional activity;No upper extremity  supported Standing balance-Leahy Scale: Fair                              Cognition Arousal/Alertness: Awake/alert Behavior During Therapy: WFL for tasks assessed/performed Overall Cognitive Status: Within Functional Limits for tasks assessed                                        Exercises      General Comments        Pertinent Vitals/Pain Pain Assessment: No/denies pain    Home Living                      Prior Function            PT Goals (current goals can now be found in the care plan section) Progress towards PT goals: Progressing toward goals    Frequency    Min 3X/week      PT Plan Current plan remains appropriate    Co-evaluation              AM-PAC PT "6 Clicks" Daily Activity  Outcome Measure  Difficulty turning over in bed (including adjusting bedclothes, sheets and blankets)?: None Difficulty moving from lying on back to sitting on the side of the bed? : None Difficulty sitting down on and standing up from a chair with arms (e.g., wheelchair, bedside commode, etc,.)?: None Help needed moving to and from a bed to chair (including a wheelchair)?: None Help needed walking  in hospital room?: None Help needed climbing 3-5 steps with a railing? : A Little 6 Click Score: 23    End of Session Equipment Utilized During Treatment: Gait belt Activity Tolerance: Patient tolerated treatment well Patient left: in bed;with call bell/phone within reach;with family/visitor present Nurse Communication: (pt ready for D/C to home) PT Visit Diagnosis: Unsteadiness on feet (R26.81)     Time: 2122-4825 PT Time Calculation (min) (ACUTE ONLY): 17 min  Charges:  $Gait Training: 8-22 mins                     {Itzel Mckibbin  PTA [facility]  Acute  Rehab Pager      [phone]

## 2017-10-14 NOTE — Discharge Summary (Signed)
Physician Discharge Summary  Jonathan Marshall TGY:[id number] DOB: 27-Mar-1932 DOA: 10/08/2017  PCP: Lorene Dy, MD  Admit date: 10/08/2017 Discharge date: 10/14/2017  Admitted From:home Disposition:  home Recommendations for Outpatient Follow-up:  1. 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 Patient recently underwent a left nephroureterectomy on 09/24/2017 and since then he has been feeling more fatigued and weak. Recently had his Foley catheter removed on Monday by urology. Patient states that he has had some nausea vomiting ever since his surgery andstates that his appetite is been extremely poor because he vomits his food up almost every several time. He also states that he has not been drinking much fluids. Patient felt dizzy upon standing and went to go see his oncologist today who checked some blood work and a urine analysis which showed likely UTI. They obtained a blood culture and started the patient on empiric IV Zosyn. Because his kidney function showed a creatinine of 4.4 he was directed to the emergency room for further evaluation.TRH was called to admit this patient for acute kidney injury on chronic kidney disease along with a suspected urinary tract infection  ED Course:Had a liter of normal saline bolused and had a repeat blood culture and urine culture added on.   Discharge Diagnoses:  Active Problems:   Enlarged prostate with urinary obstruction   Urothelial  carcinoma (HCC)   Acute kidney injury ([place])   Acute urinary tract infection   Generalized weakness   Normocytic anemia   Glaucoma   HLD (hyperlipidemia)   Depression   Malnutrition of moderate degree  1]AKI on CKD stage III renal functions  i-STAT is a new baseline of 2.37.  .  Creatinine on the day of discharge 2.37.  Patient was seen in consultation by nephrology.Patient has solitary kidney.  He was treated with IV hydration.  renal ultrasoundshows no hydronephrosis, stable right renal cyst, left nephrectomy.Patient has very poor p.o. intake with decreased appetite and also has chronic diarrhea. Patient reports having recent surgery for urethral cancer and nephrectomy July 31 of 2876.  2]complicatedE. coliUTI status post recent catheter placement and removal. Finished a course of Rocephin for 5 days.  3]normocytic anemia-his hemoglobin dropped with fluid hydration.  Hemoglobin today of prior to discharge was 8.3 which is secondary to hemodilution.  He did not have any evidence of bleeding during this hospital stay.    4]insomnia patient requesting something to sleep at night.Starting patient on Remeron to increase appetite which will also help with the sleep. DC trazodone.   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  6]depression continue sertraline.  7] hypotension his blood pressure was soft during this hospital stay he was placed on IV fluids, TED hose, small dose of midodrine 2.5 mg twice a day.  He was able to walk ambulate without dyspnea feeling dizzy once he was started on midodrine.  Discharge Instructions  Discharge Instructions    Call MD for:  difficulty breathing, headache or visual disturbances   Complete by:  As directed  Call MD for:  persistant dizziness or light-headedness   Complete by:  As directed    Call MD for:  persistant  nausea and vomiting   Complete by:  As directed    Diet - low sodium heart healthy   Complete by:  As directed    Increase activity slowly   Complete by:  As directed      Allergies as of 10/14/2017   No Known Allergies     Medication List    STOP taking these medications   diphenhydramine-acetaminophen 25-500 MG Tabs tablet Commonly known as:  TYLENOL PM   lidocaine-prilocaine cream Commonly known as:  EMLA   predniSONE 20 MG tablet Commonly known as:  DELTASONE   prochlorperazine 10 MG tablet Commonly known as:  COMPAZINE     TAKE these medications   HYDROcodone-acetaminophen 5-325 MG tablet Commonly known as:  NORCO/VICODIN Take 1 tablet by mouth every 4 (four) hours as needed for moderate pain.   latanoprost 0.005 % ophthalmic solution Commonly known as:  XALATAN Place 1 drop into both eyes at bedtime.   loperamide 2 MG capsule Commonly known as:  IMODIUM Take 1 capsule (2 mg total) by mouth every 6 (six) hours as needed for diarrhea or loose stools.   LORazepam 0.5 MG tablet Commonly known as:  ATIVAN Take 1 tablet (0.5 mg total) by mouth every 6 (six) hours as needed (Nausea or vomiting).   lovastatin 40 MG tablet Commonly known as:  MEVACOR Take 40 mg by mouth every evening.   midodrine 2.5 MG tablet Commonly known as:  PROAMATINE Take 1 tablet (2.5 mg total) by mouth 2 (two) times daily with a meal.   mirtazapine 15 MG disintegrating tablet Commonly known as:  REMERON SOL-TAB Take 0.5 tablets (7.5 mg total) by mouth at bedtime.   ondansetron 8 MG tablet Commonly known as:  ZOFRAN Take 1 tablet (8 mg total) by mouth 2 (two) times daily as needed for refractory nausea / vomiting. Start on day 3 after cytoxan chemorx   saccharomyces boulardii 250 MG capsule Commonly known as:  FLORASTOR Take 1 capsule (250 mg total) by mouth 2 (two) times daily.   sertraline 25 MG tablet Commonly known as:  ZOLOFT Take 25 mg by mouth every morning.       No  Known Allergies  Consultations:  renal   Procedures/Studies: US Renal  Result Date: 10/08/2017 CLINICAL DATA:  Acute kidney injury EXAM: RENAL / URINARY TRACT ULTRASOUND COMPLETE COMPARISON:  PET-CT 07/28/2017 and previous FINDINGS: Right Kidney: Length: 12.5. 5.1 x 5 x 4.8 cm cyst from the upper pole. Parenchyma echogenicity normal. No hydronephrosis. Left Kidney: Surgically absent Bladder: Incompletely distended, unremarkable. IMPRESSION: 1. Interval left nephrectomy. 2. Stable right renal cyst. 3. No hydronephrosis. Electronically Signed   By: Lucrezia Europe M.D.   On: 10/08/2017 18:36    (Echo, Carotid, EGD, Colonoscopy, ERCP)    Subjective:   Discharge Exam: Vitals:   10/14/17 0653 10/14/17 0940  BP: (!) 173/80 126/66  Pulse: (!) 50 61  Resp: 16   Temp: 97.8 F (36.6 C)   SpO2: 100% 100%   Vitals:   10/13/17 2315 10/13/17 2320 10/14/17 0653 10/14/17 0940  BP: (!) 101/48 116/77 (!) 173/80 126/66  Pulse: 62  (!) 50 61  Resp: 16  16   Temp: 98.2 F (36.8 C)  97.8 F (36.6 C)   TempSrc: Oral  Oral   SpO2: 100%  100% 100%  Weight:   80.6 kg  Height:        General: Pt is alert, awake, not in acute distress Cardiovascular: RRR, S1/S2 +, no rubs, no gallops Respiratory: CTA bilaterally, no wheezing, no rhonchi Abdominal: Soft, NT, ND, bowel sounds + Extremities: no edema, no cyanosis    The results of significant diagnostics from this hospitalization (including imaging, microbiology, ancillary and laboratory) are listed below for reference.     Microbiology: Recent Results (from the past 240 hour(s))  Culture, Blood     Status: None   Collection Time: 10/08/17 10:30 AM  Result Value Ref Range Status   Specimen Description BLOOD PORTA CATH  Final   Special Requests   Final    BOTTLES DRAWN AEROBIC AND ANAEROBIC Blood Culture adequate volume   Culture   Final    NO GROWTH 5 DAYS Performed at [facility], [address], [place], [place] [address]     Report Status 10/13/2017 FINAL  Final  Urine culture     Status: Abnormal   Collection Time: 10/08/17 12:47 PM  Result Value Ref Range Status   Specimen Description   Final    URINE, RANDOM Performed at [facility] [address], [place], [place] [address]    Special Requests   Final    NONE Performed at [facility], [place] [address] [place], [place] [address]    Culture >=100,000 COLONIES/mL ESCHERICHIA COLI (A)  Final   Report Status 10/10/2017 FINAL  Final   Organism ID, Bacteria ESCHERICHIA COLI (A)  Final      Susceptibility   Escherichia coli - MIC*    AMPICILLIN <=2 SENSITIVE Sensitive     CEFAZOLIN <=4 SENSITIVE Sensitive     CEFTRIAXONE <=1 SENSITIVE Sensitive     CIPROFLOXACIN <=0.25 SENSITIVE Sensitive     GENTAMICIN <=1 SENSITIVE Sensitive     IMIPENEM <=0.25 SENSITIVE Sensitive     NITROFURANTOIN <=16 SENSITIVE Sensitive     TRIMETH/SULFA <=20 SENSITIVE Sensitive     AMPICILLIN/SULBACTAM <=2 SENSITIVE Sensitive     PIP/TAZO <=4 SENSITIVE Sensitive     Extended ESBL NEGATIVE Sensitive     * >=100,000 COLONIES/mL ESCHERICHIA COLI  Culture, blood (single) w Reflex to ID Panel     Status: None   Collection Time: 10/08/17  1:11 PM  Result Value Ref Range Status   Specimen Description   Final    BLOOD RIGHT HAND Performed at [facility] [address], [place], [place] [address]    Special Requests   Final    BOTTLES DRAWN AEROBIC AND ANAEROBIC Blood Culture results may not be optimal due to an inadequate volume of blood received in culture bottles Performed at [place] [address] [place], [place] [address]    Culture   Final    NO GROWTH 5 DAYS Performed at [facility], [place] [address] [place], [place] [address]    Report Status 10/13/2017 FINAL  Final  Gastrointestinal Panel by PCR , Stool     Status: None   Collection Time: 10/13/17 10:03 AM  Result Value Ref  Range Status   Campylobacter species NOT DETECTED NOT DETECTED Final   Plesimonas shigelloides NOT DETECTED NOT DETECTED Final   Salmonella species NOT DETECTED NOT DETECTED Final   Yersinia enterocolitica NOT DETECTED NOT DETECTED Final   Vibrio species NOT DETECTED NOT DETECTED Final   Vibrio cholerae NOT DETECTED NOT DETECTED Final   Enteroaggregative E coli (EAEC) NOT DETECTED  NOT DETECTED Final   Enteropathogenic E coli (EPEC) NOT DETECTED NOT DETECTED Final   Enterotoxigenic E coli (ETEC) NOT DETECTED NOT DETECTED Final   Shiga like toxin producing E coli (STEC) NOT DETECTED NOT DETECTED Final   Shigella/Enteroinvasive E coli (EIEC) NOT DETECTED NOT DETECTED Final   Cryptosporidium NOT DETECTED NOT DETECTED Final   Cyclospora cayetanensis NOT DETECTED NOT DETECTED Final   Entamoeba histolytica NOT DETECTED NOT DETECTED Final   Giardia lamblia NOT DETECTED NOT DETECTED Final   Adenovirus F40/41 NOT DETECTED NOT DETECTED Final   Astrovirus NOT DETECTED NOT DETECTED Final   Norovirus GI/GII NOT DETECTED NOT DETECTED Final   Rotavirus A NOT DETECTED NOT DETECTED Final   Sapovirus (I, II, IV, and V) NOT DETECTED NOT DETECTED Final    Comment: Performed at [facility], [place] [place], [place] [address]     Labs: BNP (last 3 results) No results for input(s): BNP in the last 8760 hours. Basic Metabolic Panel: Recent Labs  Lab 10/08/17 0857  10/08/17 1540 10/09/17 0755 10/10/17 0555 10/11/17 0610 10/12/17 0545 10/13/17 0500 10/14/17 0812  NA 136  --   --  139 141 142 142 142 142  K 3.7  --   --  3.3* 3.7 3.9 3.8 4.0 3.7  CL 106  --   --  115* 118* 122* 122* 123* 123*  CO2 12*  --   --  15* 15* 15* 15* 15* 14*  GLUCOSE 107*  --   --  93 105* 108* 95 84 90  BUN 82*  --   --  70* 55* 42* 32* 27* 22  CREATININE 4.40*   < > 4.07* 3.61* 2.99* 2.44* 2.32* 2.41* 2.37*  CALCIUM 10.0  --   --  8.9 9.4 9.4 9.2 9.1 9.0  MG 2.4  --  2.3 2.2  --   --   --   --   --    PHOS  --   --  5.2* 4.9*  --   --   --   --   --    < > = values in this interval not displayed.   Liver Function Tests: Recent Labs  Lab 10/08/17 0857 10/09/17 0755  AST 33 21  ALT 39 28  ALKPHOS 93 65  BILITOT 0.3 0.3  PROT 7.3 6.1*  ALBUMIN 3.2* 2.9*   No results for input(s): LIPASE, AMYLASE in the last 168 hours. No results for input(s): AMMONIA in the last 168 hours. CBC: Recent Labs  Lab 10/08/17 0857  10/09/17 0755 10/10/17 0555 10/11/17 0610 10/12/17 0545 10/13/17 0500  WBC 13.7*   < > 8.8 9.7 7.4 7.4 6.8  NEUTROABS 10.4*  --  6.5  --   --   --   --   HGB 10.1*   < > 8.5* 9.1* 9.0* 9.2* 8.3*  HCT 30.7*   < > 25.4* 28.4* 27.7* 28.5* 26.2*  MCV 92.5   < > 92.4 94.0 94.9 95.6 97.0  PLT 415*   < > 432* 468* 405* 381 295   < > = values in this interval not displayed.   Cardiac Enzymes: No results for input(s): CKTOTAL, CKMB, CKMBINDEX, TROPONINI in the last 168 hours. BNP: Invalid input(s): POCBNP CBG: Recent Labs  Lab 10/09/17 0722 10/10/17 0745 10/11/17 0733 10/12/17 0733 10/14/17 0727  GLUCAP 115* 95 122* 88 85   D-Dimer No results for input(s): DDIMER in the last 72 hours. Hgb A1c No results for  input(s): HGBA1C in the last 72 hours. Lipid Profile No results for input(s): CHOL, HDL, LDLCALC, TRIG, CHOLHDL, LDLDIRECT in the last 72 hours. Thyroid function studies No results for input(s): TSH, T4TOTAL, T3FREE, THYROIDAB in the last 72 hours.  Invalid input(s): FREET3 Anemia work up No results for input(s): VITAMINB12, FOLATE, FERRITIN, TIBC, IRON, RETICCTPCT in the last 72 hours. Urinalysis    Component Value Date/Time   COLORURINE YELLOW 10/08/2017 0858   APPEARANCEUR CLOUDY (A) 10/08/2017 0858   LABSPEC 1.016 10/08/2017 0858   PHURINE 5.0 10/08/2017 0858   GLUCOSEU NEGATIVE 10/08/2017 0858   HGBUR MODERATE (A) 10/08/2017 0858   BILIRUBINUR NEGATIVE 10/08/2017 0858   [place] 10/08/2017 0858   PROTEINUR 100 (A) 10/08/2017 0858    NITRITE NEGATIVE 10/08/2017 0858   LEUKOCYTESUR LARGE (A) 10/08/2017 0858   Sepsis Labs Invalid input(s): PROCALCITONIN,  WBC,  LACTICIDVEN Microbiology Recent Results (from the past 240 hour(s))  Culture, Blood     Status: None   Collection Time: 10/08/17 10:30 AM  Result Value Ref Range Status   Specimen Description BLOOD PORTA CATH  Final   Special Requests   Final    BOTTLES DRAWN AEROBIC AND ANAEROBIC Blood Culture adequate volume   Culture   Final    NO GROWTH 5 DAYS Performed at [facility], [place] [address], [place], [place] [address]    Report Status 10/13/2017 FINAL  Final  Urine culture     Status: Abnormal   Collection Time: 10/08/17 12:47 PM  Result Value Ref Range Status   Specimen Description   Final    URINE, RANDOM Performed at [facility] [address] [place], [place] [address]    Special Requests   Final    NONE Performed at [facility], [place] [address] [place], [place] [address]    Culture >=100,000 COLONIES/mL ESCHERICHIA COLI (A)  Final   Report Status 10/10/2017 FINAL  Final   Organism ID, Bacteria ESCHERICHIA COLI (A)  Final      Susceptibility   Escherichia coli - MIC*    AMPICILLIN <=2 SENSITIVE Sensitive     CEFAZOLIN <=4 SENSITIVE Sensitive     CEFTRIAXONE <=1 SENSITIVE Sensitive     CIPROFLOXACIN <=0.25 SENSITIVE Sensitive     GENTAMICIN <=1 SENSITIVE Sensitive     IMIPENEM <=0.25 SENSITIVE Sensitive     NITROFURANTOIN <=16 SENSITIVE Sensitive     TRIMETH/SULFA <=20 SENSITIVE Sensitive     AMPICILLIN/SULBACTAM <=2 SENSITIVE Sensitive     PIP/TAZO <=4 SENSITIVE Sensitive     Extended ESBL NEGATIVE Sensitive     * >=100,000 COLONIES/mL ESCHERICHIA COLI  Culture, blood (single) w Reflex to ID Panel     Status: None   Collection Time: 10/08/17  1:11 PM  Result Value Ref Range Status   Specimen Description   Final    BLOOD RIGHT HAND Performed at [facility]  [address], [place], [place] [address]    Special Requests   Final    BOTTLES DRAWN AEROBIC AND ANAEROBIC Blood Culture results may not be optimal due to an inadequate volume of blood received in culture bottles Performed at [facility] [address] [place], [place] [address]    Culture   Final    NO GROWTH 5 DAYS Performed at [facility], [place] [address] [place], [place] [address]    Report Status 10/13/2017 FINAL  Final  Gastrointestinal Panel by PCR , Stool  Status: None   Collection Time: 10/13/17 10:03 AM  Result Value Ref Range Status   Campylobacter species NOT DETECTED NOT DETECTED Final   Plesimonas shigelloides NOT DETECTED NOT DETECTED Final   Salmonella species NOT DETECTED NOT DETECTED Final   Yersinia enterocolitica NOT DETECTED NOT DETECTED Final   Vibrio species NOT DETECTED NOT DETECTED Final   Vibrio cholerae NOT DETECTED NOT DETECTED Final   Enteroaggregative E coli (EAEC) NOT DETECTED NOT DETECTED Final   Enteropathogenic E coli (EPEC) NOT DETECTED NOT DETECTED Final   Enterotoxigenic E coli (ETEC) NOT DETECTED NOT DETECTED Final   Shiga like toxin producing E coli (STEC) NOT DETECTED NOT DETECTED Final   Shigella/Enteroinvasive E coli (EIEC) NOT DETECTED NOT DETECTED Final   Cryptosporidium NOT DETECTED NOT DETECTED Final   Cyclospora cayetanensis NOT DETECTED NOT DETECTED Final   Entamoeba histolytica NOT DETECTED NOT DETECTED Final   Giardia lamblia NOT DETECTED NOT DETECTED Final   Adenovirus F40/41 NOT DETECTED NOT DETECTED Final   Astrovirus NOT DETECTED NOT DETECTED Final   Norovirus GI/GII NOT DETECTED NOT DETECTED Final   Rotavirus A NOT DETECTED NOT DETECTED Final   Sapovirus (I, II, IV, and V) NOT DETECTED NOT DETECTED Final    Comment: Performed at [facility], [address], [place], [place] [address]     Time coordinating discharge: 33 minutes  SIGNED:   Georgette Shell, MD  [facility] 10/14/2017, 10:14 AM Pager   If 7PM-7AM, please contact night-coverage [email or web address] Password [email or web address]

## 2017-10-14 NOTE — Progress Notes (Signed)
Occupational Therapy Treatment Patient Details Name: Jonathan Marshall MRN: [id number] DOB: [birth year] Today's Date: 10/14/2017    History of present illness pt was admitted for weakness lethargy and nausea/vomiting.  Found to have acute kidney injury superimposed on chronic kidney disease.  s/p nephroureterectomy on 7/31   OT comments  Pt much improved!  Pt ready to go home with wife to A  Follow Up Recommendations  Home health OT;Supervision - Intermittent    Equipment Recommendations  None recommended by OT       Precautions / Restrictions Precautions Precautions: Fall Precaution Comments: incontinent of BM at times-wears pull ups Restrictions Weight Bearing Restrictions: No       Mobility Bed Mobility Overal bed mobility: Modified Independent             General bed mobility comments: increased time  Transfers Overall transfer level: Needs assistance Equipment used: Rolling walker (2 wheeled) Transfers: Sit to/from [facility] Sit to Stand: Supervision Stand pivot transfers: Supervision       General transfer comment: for safety; cues for UE placement    Balance Overall balance assessment: History of Falls;Needs assistance Sitting-balance support: Feet supported;No upper extremity supported Sitting balance-Leahy Scale: Good     Standing balance support: During functional activity;No upper extremity supported Standing balance-Leahy Scale: Fair                             ADL either performed or assessed with clinical judgement   ADL Overall ADL's : Needs assistance/impaired Eating/Feeding: Set up;Sitting   Grooming: Set up;Sitting   Upper Body Bathing: Set up;Sitting   Lower Body Bathing: Supervison/ safety;Sit to/from stand;Cueing for sequencing;Cueing for safety   Upper Body Dressing : Set up;Sitting   Lower Body Dressing: Supervision/safety;Sit to/from stand;Cueing for sequencing;Cueing for safety   Toilet  Transfer: Supervision/safety;Ambulation;Cueing for sequencing;Cueing for safety;RW   Toileting- Clothing Manipulation and Hygiene: Supervision/safety;Sit to/from Nurse, children's Details (indicate cue type and reason): verbalized safety Functional mobility during ADLs: Supervision/safety;Rolling walker;Cueing for sequencing       Vision Patient Visual Report: No change from baseline            Cognition Arousal/Alertness: Awake/alert Behavior During Therapy: WFL for tasks assessed/performed Overall Cognitive Status: Within Functional Limits for tasks assessed                                                     Pertinent Vitals/ Pain       Pain Assessment: No/denies pain     Prior Functioning/Environment              Frequency  Min 2X/week        Progress Toward Goals  OT Goals(current goals can now be found in the care plan section)  Progress towards OT goals: Progressing toward goals     Plan Discharge plan remains appropriate       AM-PAC PT "6 Clicks" Daily Activity     Outcome Measure   Help from another person eating meals?: None Help from another person taking care of personal grooming?: A Little Help from another person toileting, which includes using toliet, bedpan, or urinal?: A Little Help from another person bathing (including washing, rinsing, drying)?: A Little Help from another person to put  on and taking off regular upper body clothing?: None Help from another person to put on and taking off regular lower body clothing?: A Little 6 Click Score: 20    End of Session Equipment Utilized During Treatment: Rolling walker  OT Visit Diagnosis: Muscle weakness (generalized) (M62.81)   Activity Tolerance Patient tolerated treatment well   Patient Left with call bell/phone within reach;in chair;with chair alarm set   Nurse Communication Mobility status        Time: 0301-3143 OT Time Calculation (min): 14  min  Charges: OT General Charges $OT Visit: 1 Visit OT Treatments $Self Care/Home Management : 8-22 mins  [place], [place] [phone]   Payton Mccallum D 10/14/2017, 9:57 AM

## 2017-10-16 ENCOUNTER — Inpatient Hospital Stay: Payer: Medicare Other | Admitting: Hematology

## 2017-10-16 ENCOUNTER — Telehealth: Payer: Self-pay

## 2017-10-16 ENCOUNTER — Inpatient Hospital Stay: Payer: Medicare Other

## 2017-10-16 NOTE — Telephone Encounter (Signed)
Spoke with patient's wife concerning his upcoming appointment, tthat was r/s from 8/22  sch. msg.  To move appointment out by 2 weeks

## 2017-10-20 ENCOUNTER — Telehealth: Payer: Self-pay

## 2017-10-20 NOTE — Telephone Encounter (Signed)
Patients wife called and is concerned because her husband is not eating very well.  He is nauseous and hasn't been able to keep a lot of food down.  I instructed her to start using the Zofran that was prescribed to her.  She understood that she needs to give it to him twice a day to stay on top of the nausea.  She will also give him Ativan at night to help with the nausea and help him rest.  I told her to call us back if he doesn't feel better soon.  She verbalized understanding.

## 2017-10-29 NOTE — Progress Notes (Signed)
HEMATOLOGY/ONCOLOGY CLINIC NOTE  Date of Service: 10/30/17    Patient Care Team: Lorene Dy, MD as PCP - General (Internal Medicine)  CHIEF COMPLAINTS/PURPOSE OF CONSULTATION:  F/u for mx of diffuse large B-Cell Lymphoma  HISTORY OF PRESENTING ILLNESS:   Jonathan Marshall is a wonderful 84 y.o. male who has been referred to [place] by urologist Dr Alexis Frock for evaluation and management of newly diagnosed diffuse large B Cell Lymphoma. He is accompanied today by his wife. The pt reports that he is doing well overall.   The pt reports that in 1993 he had colon cancer with two tumors in the ascending and descending colon, which was picked up from a routine physical. He notes that he was treated with chemotherapy with Levamisole and Leukovorin for 6 months. He has not had any issues with the colon cancer since. He continue to see Dr. Cristina Gong for his GI.   He notes that he has had blood in the urine for a year and a half, picked up in a physical. This prompted his TURP surgery in July 2018. He notes that the bleeding persisted after the surgery. He notes that a stent was placed which has significantly reduced his hematuria. In February 2019 his left uretral mass was identified and subsequently biopsied confirming adenocarcinoma. The pt notes that Dr. Tresa Moore has up to this point been considering removing one of his kidneys, which would have a bearing on our treatment.   A 04/21/17 CT Abdomen identified a right retroperitoneal mass which was biopsied on 05/08/17 which revealed Large B-Cell Lymphoma.   He notes no ongoing health problems and is without physical limitations. He denies having had any blood transfusions.   On 04/23/17 the pt had a Uretral biopsy revealing Ureter, biopsy, Left mass- SUPERFICIAL FRAGMENTS OF UROTHELIAL CARCINOMA ASSOCIATED WITH GRANULATION TISSUE AND NECROSIS.   CT Abdomen of 04/21/17 revealed large right para-aortic retroperitoneal nodal conglomeration in the  abdomen, just inferior to the right renal artery. Imaging features compatible with metastatic disease in this patient with a history of prostate and left urothelial cancer. 2. Aortic atherosclerosis.   Soft Tissue Needle/core biopsy completed on 05/08/17 with results revealing Large B-Cell Lymphoma.   Most recent lab results (05/08/17) of CBC  is as follows: all values are WNL except for WBC at 11.3k, Monocytes Abs at 1.1k, CO2 at 19, Glucose at 109, Creatinine at 1.54, ALT at 16.  On review of systems, pt reports infrequent hematuria, mild back pain associated with golfing, and denies leg swelling, flank pain, fevers, chills, night sweats and any other symptoms.   On PMHx the pt reports Colon cancer in 1993, hypercholesterolemia, glaucoma, low risk prostate cancer, mastoiditis, hernia repair, carpel tunnel. He denies heart or lung problems.  On Social Hx the pt denies smoking and ETOH consumption. On Family Hx the pt reports that his mother had breast cancer when she was roughly 71, and also cervical cancer. On his mother's side, his aunts and uncles had several cancers including lung. Maternal grandfather had prostate cancer.   Interval History:  Jonathan Marshall returns today regarding his Diffuse Large B-cell Lymphoma. I last saw the pt as an inpatient on 10/13/17 after his 09/24/17 nephroureterectomy. He is accompanied today by his wife. The pt reports that he is doing well overall.   The pt reports that he returned to care with his urologist Dr. Tresa Moore yesterday and had a urine culture that the pt notes was positive for a UTI and  he began Keflex yesterday. The pt notes that 5 days ago he developed returned blood in the urine with some blood clots and urinary frequency and urgency. The pt notes that his urination is not painful. He notes that he has not been staying very well hydrated. He notes that he has been drinking 40 oz of liquids, not just water.   The pt notes that he is eating maybe 20% of  his normal diet. He notes that his food has lost its taste which is limiting is intake. He notes that he still has his nausea medication and is taking Zofran BID and Ativan at night.   He adds that he is receiving PT and [place] twice a week.   Lab results today (10/30/17) of CBC w/diff, CMP, and Reticulocytes is as follows: all values are WNL except for RBC at 3.77, HGB at 11.4, HCT at 34.1, RDW at 14.9, CO2 at 16, Glucose at 107, BUN at 64, Creatinine at 5.52, AST at 10, GFR at 9. 10/30/17 Phosphorous is at 6.3 10/30/17 Magnesium at 2.0  On review of systems, pt reports frequent urination, urgent urination, feeling weak, blood in the urine, not eating well, not staying hydrated, and denies abdominal pains, leg swelling, and any other symptoms.    MEDICAL HISTORY:  Past Medical History:  Diagnosis Date  . BPH (benign prostatic hyperplasia)   . Cancer [facility]) 1993   Colon surgery and chemo , 2-19 left ureteral camcer last chemo tx 08-27-17  . DDD (degenerative disc disease), lumbar    L4-L5  . Depression   . Early stage glaucoma    both eyes  . Hearing loss   . Hematuria, gross   . History of adenomatous polyp of colon   . History of carpal tunnel syndrome    left  . History of colon cancer    dx 1993  s/p  hemicolectomy (malignant tumor x2)  and chemo therapy completed 1993--- no recurrence per pt  . History of shingles   . HOH (hard of hearing)    right ear  . Hyperlipidemia   . Insomnia     SURGICAL HISTORY: Past Surgical History:  Procedure Laterality Date  . APPENDECTOMY    . CARPAL TUNNEL RELEASE Left 08/13/2013   Procedure: CARPAL TUNNEL RELEASE LEFT WRIST;  Surgeon: Yvette Rack., MD;  Location: [facility];  Service: Orthopedics;  Laterality: Left;  . COLONOSCOPY    . CYSTOSCOPY W/ RETROGRADES Bilateral 04/03/2017   Procedure: CYSTOSCOPY WITH RETROGRADE PYELOGRAM, LEFT URETEROSCOPY, LEFT STENT PLACEMENT;  Surgeon: Franchot Gallo, MD;  Location:  [facility];  Service: Urology;  Laterality: Bilateral;  . CYSTOSCOPY W/ URETERAL STENT PLACEMENT Left 04/23/2017   Procedure: CYSTOSCOPY WITH STENT REPLACEMENT;  Surgeon: Alexis Frock, MD;  Location: [facility];  Service: Urology;  Laterality: Left;  . CYSTOSCOPY WITH FULGERATION Left 04/03/2017   Procedure: LEFT URETERAL BIOPSY;  Surgeon: Franchot Gallo, MD;  Location: [facility];  Service: Urology;  Laterality: Left;  . CYSTOSCOPY/RETROGRADE/URETEROSCOPY Left 04/23/2017   Procedure: CYSTOSCOPY/RETROGRADE/URETEROSCOPY, LEFT  URETEROSCOPY WITY  BIOPSY;  Surgeon: Alexis Frock, MD;  Location: [facility];  Service: Urology;  Laterality: Left;  . HEMICOLECTOMY  1993   malignant tumor x2 , colon cancer and Appendectomy  . INGUINAL HERNIA REPAIR Right 2003  . IR FLUORO GUIDE PORT INSERTION RIGHT  05/30/2017  . IR US GUIDE VASC ACCESS RIGHT  05/30/2017  . MASTOIDECTOMY Right 2015  .  ROBOT ASSITED LAPAROSCOPIC NEPHROURETERECTOMY Left 09/24/2017   Procedure: XI ROBOT ASSITED LAPAROSCOPIC NEPHROURETERECTOMY;  Surgeon: Alexis Frock, MD;  Location: [facility];  Service: Urology;  Laterality: Left;  . ROBOTIC ASSISTED LAPAROSCOPIC LYSIS OF ADHESION  09/24/2017   Procedure: XI ROBOTIC ASSISTED LAPAROSCOPIC EXTENSIVE LYSIS OF ADHESION;  Surgeon: Alexis Frock, MD;  Location: [facility];  Service: Urology;;  . Lonell Face  last one 06/ 2018  . TRANSURETHRAL RESECTION OF PROSTATE N/A 09/02/2016   Procedure: TRANSURETHRAL RESECTION OF THE PROSTATE (TURP);  Surgeon: Franchot Gallo, MD;  Location: [facility];  Service: Urology;  Laterality: N/A;    SOCIAL HISTORY: Social History   Socioeconomic History  . Marital status: Married    Spouse name: Not on file  . Number of children: Not on file  . Years of education: Not on file  . Highest education level: Not on file  Occupational History  . Not on file  Social Needs  .  Financial resource strain: Not on file  . Food insecurity:    Worry: Not on file    Inability: Not on file  . Transportation needs:    Medical: Not on file    Non-medical: Not on file  Tobacco Use  . Smoking status: Never Smoker  . Smokeless tobacco: Never Used  Substance and Sexual Activity  . Alcohol use: Yes    Alcohol/week: 1.0 standard drinks    Types: 1 Glasses of wine per week    Comment: rare  . Drug use: Never  . Sexual activity: Not Currently  Lifestyle  . Physical activity:    Days per week: Not on file    Minutes per session: Not on file  . Stress: Not on file  Relationships  . Social connections:    Talks on phone: Not on file    Gets together: Not on file    Attends religious service: Not on file    Active member of club or organization: Not on file    Attends meetings of clubs or organizations: Not on file    Relationship status: Not on file  . Intimate partner violence:    Fear of current or ex partner: Not on file    Emotionally abused: Not on file    Physically abused: Not on file    Forced sexual activity: Not on file  Other Topics Concern  . Not on file  Social History Narrative  . Not on file    FAMILY HISTORY: History reviewed. No pertinent family history.  ALLERGIES:  has No Known Allergies.  MEDICATIONS:  Current Outpatient Medications  Medication Sig Dispense Refill  . cephALEXin (KEFLEX) 500 MG capsule Take 500 mg by mouth 2 (two) times daily.    [name] Kitchen HYDROcodone-acetaminophen (NORCO/VICODIN) 5-325 MG tablet Take 1 tablet by mouth every 4 (four) hours as needed for moderate pain. 20 tablet 0  . latanoprost (XALATAN) 0.005 % ophthalmic solution Place 1 drop into both eyes at bedtime.    [name] Kitchen loperamide (IMODIUM) 2 MG capsule Take 1 capsule (2 mg total) by mouth every 6 (six) hours as needed for diarrhea or loose stools. 30 capsule 0  . LORazepam (ATIVAN) 0.5 MG tablet Take 1 tablet (0.5 mg total) by mouth every 6 (six) hours as needed (Nausea or  vomiting). 30 tablet 0  . lovastatin (MEVACOR) 40 MG tablet Take 40 mg by mouth every evening.    . midodrine (PROAMATINE) 2.5 MG tablet Take 1 tablet (2.5 mg total) by mouth 2 (two) times  daily with a meal. 60 tablet 0  . mirtazapine (REMERON SOL-TAB) 15 MG disintegrating tablet Take 0.5 tablets (7.5 mg total) by mouth at bedtime. 30 tablet 0  . ondansetron (ZOFRAN) 8 MG tablet Take 1 tablet (8 mg total) by mouth 2 (two) times daily as needed for refractory nausea / vomiting. Start on day 3 after cytoxan chemorx 30 tablet 1  . saccharomyces boulardii (FLORASTOR) 250 MG capsule Take 1 capsule (250 mg total) by mouth 2 (two) times daily. 60 capsule 0  . sertraline (ZOLOFT) 25 MG tablet Take 25 mg by mouth every morning.      No current facility-administered medications for this visit.     REVIEW OF SYSTEMS:    A 10+ POINT REVIEW OF SYSTEMS WAS OBTAINED including neurology, dermatology, psychiatry, cardiac, respiratory, lymph, extremities, GI, GU, Musculoskeletal, constitutional, breasts, reproductive, HEENT.  All pertinent positives are noted in the HPI.  All others are negative.   PHYSICAL EXAMINATION: ECOG PERFORMANCE STATUS: 1 - Symptomatic but completely ambulatory  Vitals:   10/30/17 1047 10/30/17 1048  BP: (!) 71/41 99/65  Pulse: 96   Resp: 18   Temp: 97.7 F (36.5 C)   SpO2: 100%    Filed Weights   10/30/17 1047  Weight: 162 lb 9.6 oz (73.8 kg)   .Body mass index is 24.01 kg/m.  GENERAL:alert, in no acute distress and comfortable SKIN: no acute rashes, no significant lesions EYES: conjunctiva are pink and non-injected, sclera anicteric OROPHARYNX: MMM, no exudates, no oropharyngeal erythema or ulceration NECK: supple, no JVD LYMPH:  no palpable lymphadenopathy in the cervical, axillary or inguinal regions LUNGS: clear to auscultation b/l with normal respiratory effort HEART: regular rate & rhythm ABDOMEN:  normoactive bowel sounds , non tender, not distended. No  palpable hepatosplenomegaly.  Extremity: no pedal edema PSYCH: alert & oriented x 3 with fluent speech NEURO: no focal motor/sensory deficits    LABORATORY DATA:  I have reviewed the data as listed  . CBC Latest Ref Rng & Units 10/30/2017 10/13/2017 10/12/2017  WBC 4.0 - 10.3 K/uL 4.9 6.8 7.4  Hemoglobin 13.0 - 17.1 g/dL 11.4(L) 8.3(L) 9.2(L)  Hematocrit 38.4 - 49.9 % 34.1(L) 26.2(L) 28.5(L)  Platelets 140 - 400 K/uL 266 295 381    . CMP Latest Ref Rng & Units 10/30/2017 10/14/2017 10/13/2017  Glucose 70 - 99 mg/dL 107(H) 90 84  BUN 8 - 23 mg/dL 64(H) 22 27(H)  Creatinine 0.61 - 1.24 mg/dL 5.52(HH) 2.37(H) 2.41(H)  Sodium 135 - 145 mmol/L 135 142 142  Potassium 3.5 - 5.1 mmol/L 4.5 3.7 4.0  Chloride 98 - 111 mmol/L 107 123(H) 123(H)  CO2 22 - 32 mmol/L 16(L) 14(L) 15(L)  Calcium 8.9 - 10.3 mg/dL 10.3 9.0 9.1  Total Protein 6.5 - 8.1 g/dL 7.3 - -  Total Bilirubin 0.3 - 1.2 mg/dL 0.3 - -  Alkaline Phos 38 - 126 U/L 82 - -  AST 15 - 41 U/L 10(L) - -  ALT 0 - 44 U/L 9 - -   Component     Latest Ref Rng & Units 05/15/2017  Retic Ct Pct     0.8 - 1.8 % 1.6  RBC.     4.20 - 5.82 MIL/uL 4.62  Retic Count, Absolute     34.8 - 93.9 K/uL 73.9  Prothrombin Time     11.4 - 15.2 seconds 14.5  INR      1.13  APTT     24 - 36 seconds 31  HIV Screen 4th Generation wRfx     Non Reactive Non Reactive  HCV Ab     0.0 - 0.9 s/co ratio <0.1  Hep B Core Ab, Tot     Negative Negative  Hepatitis B Surface Ag     Negative Negative  LDH     125 - 245 U/L 132   . Lab Results  Component Value Date   LDH 110 10/30/2017     05/08/17 Soft Tissue Needle Core Biopsy:   04/23/17 Ureter Biopsy:   RADIOGRAPHIC STUDIES: I have personally reviewed the radiological images as listed and agreed with the findings in the report. US Renal  Result Date: 10/08/2017 CLINICAL DATA:  Acute kidney injury EXAM: RENAL / URINARY TRACT ULTRASOUND COMPLETE COMPARISON:  PET-CT 07/28/2017 and previous FINDINGS:  Right Kidney: Length: 12.5. 5.1 x 5 x 4.8 cm cyst from the upper pole. Parenchyma echogenicity normal. No hydronephrosis. Left Kidney: Surgically absent Bladder: Incompletely distended, unremarkable. IMPRESSION: 1. Interval left nephrectomy. 2. Stable right renal cyst. 3. No hydronephrosis. Electronically Signed   By: Lucrezia Europe M.D.   On: 10/08/2017 18:36   04/21/17 CT Abdomen   ASSESSMENT & PLAN:   84 y.o. male with  1. Recently diagnosed Diffuse Large B Cell Lymphoma - Stage II -bulky  05/26/17 PET/CT which showed 1. 5 cm right-sided retroperitoneal nodal mass is hypermetabolic and consistent with known lymphoma. Small lymph node just below the aortic bifurcation is also hypermetabolic. -ECHO shows normal EF.  S/p 4 cycles of R-mini CHOP 07/28/17 PET/CT which revealed Significant response to interval therapy. The large retroperitoneal mass is significantly smaller with minimal residual activity, slightly greater than blood pool ([place] 3). No evidence of disease progression.  -Recommend Ensure/boost for nutrition supplement PLAN: -we will consider the role of RT after rpt imaging 6-8 weeks post op   2. 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  -s/p left nephroureterectomy pm 09/24/2017 PLAN: -continue urologic f/u with Dr Tresa Moore.  3. S/p post operative Acute UTI with E.coli with sepsis and dehydration 4. AKI on CKD- single kidney. - likely due to poor po intake 5. Protein calorie malnutrition-moderate 6. Diarrhea - neg GI panel -- resolved  PLAN -Discussed pt labwork today, 10/30/17; Creatinine bumped up to 5.5, BUN at 64, HGB increased to 11.4 -Pt is having some blood clots in his urine, concern for obstructive element? -Recommended that the pt move up his next appointment with his urologist Dr. Tresa Moore  -Will order IVF today  -Will set pt up for IVF twice a week with infusion room and is possible [place]  -Strongly recommended that the pt increase his hydration and food consumption -Recommended that the pt consume two Boosts a day -Will order Marinol for appetite stimulation -Will refer pt to our nutritional therapist Ernestene Kiel -No current plan for additional treatment, but will consider RT after pt recovers from recent surgery, sepsis, and UTI -Begin taking 15mg  Remeron in place of Ativan at night    -IVF NS today -Please add IVF at home twice weekly through advanced home care -f/u with Urology/Dr [facility] ASAP for bump in creatinine -nutrition referral to [employer] with Dr Irene Limbo in 9-10 days with labs     All of the patients questions were answered with apparent satisfaction. The patient knows to call the clinic with any problems, questions or concerns.  The total time spent in the appt was 30  minutes and more than 50% was on counseling and direct patient cares.   Sullivan Lone MD MS AAHIVMS [facility] Hematology/Oncology Physician [facility]  (Office):       [phone] (Work cell):  [phone] (Fax):           [phone]  10/30/2017 11:58 AM  I, Baldwin [place], am acting as a scribe for Dr. Irene Limbo  .I have reviewed the above documentation for accuracy and completeness, and I agree with the above. Brunetta Genera MD

## 2017-10-30 ENCOUNTER — Encounter: Payer: Self-pay | Admitting: Hematology

## 2017-10-30 ENCOUNTER — Other Ambulatory Visit: Payer: Self-pay

## 2017-10-30 ENCOUNTER — Inpatient Hospital Stay: Payer: Medicare Other

## 2017-10-30 ENCOUNTER — Inpatient Hospital Stay (HOSPITAL_BASED_OUTPATIENT_CLINIC_OR_DEPARTMENT_OTHER): Payer: Medicare Other | Admitting: Hematology

## 2017-10-30 ENCOUNTER — Inpatient Hospital Stay: Payer: Medicare Other | Attending: Hematology

## 2017-10-30 VITALS — BP 99/65 | HR 96 | Temp 97.7°F | Resp 18 | Ht 69.0 in | Wt 162.6 lb

## 2017-10-30 VITALS — BP 100/63 | HR 55 | Temp 97.9°F | Resp 18

## 2017-10-30 DIAGNOSIS — E860 Dehydration: Secondary | ICD-10-CM

## 2017-10-30 DIAGNOSIS — Y838 Other surgical procedures as the cause of abnormal reaction of the patient, or of later complication, without mention of misadventure at the time of the procedure: Secondary | ICD-10-CM | POA: Diagnosis not present

## 2017-10-30 DIAGNOSIS — Z85038 Personal history of other malignant neoplasm of large intestine: Secondary | ICD-10-CM | POA: Insufficient documentation

## 2017-10-30 DIAGNOSIS — T8144XA Sepsis following a procedure, initial encounter: Secondary | ICD-10-CM

## 2017-10-30 DIAGNOSIS — E440 Moderate protein-calorie malnutrition: Secondary | ICD-10-CM | POA: Insufficient documentation

## 2017-10-30 DIAGNOSIS — C689 Malignant neoplasm of urinary organ, unspecified: Secondary | ICD-10-CM

## 2017-10-30 DIAGNOSIS — C8330 Diffuse large B-cell lymphoma, unspecified site: Secondary | ICD-10-CM

## 2017-10-30 DIAGNOSIS — N179 Acute kidney failure, unspecified: Secondary | ICD-10-CM | POA: Insufficient documentation

## 2017-10-30 DIAGNOSIS — N189 Chronic kidney disease, unspecified: Secondary | ICD-10-CM | POA: Insufficient documentation

## 2017-10-30 DIAGNOSIS — C662 Malignant neoplasm of left ureter: Secondary | ICD-10-CM | POA: Diagnosis not present

## 2017-10-30 DIAGNOSIS — C8333 Diffuse large B-cell lymphoma, intra-abdominal lymph nodes: Secondary | ICD-10-CM | POA: Diagnosis present

## 2017-10-30 DIAGNOSIS — N390 Urinary tract infection, site not specified: Secondary | ICD-10-CM

## 2017-10-30 LAB — CMP (CANCER CENTER ONLY)
ALK PHOS: 82 U/L (ref 38–126)
ALT: 9 U/L (ref 0–44)
AST: 10 U/L — AB (ref 15–41)
Albumin: 3.9 g/dL (ref 3.5–5.0)
Anion gap: 12 (ref 5–15)
BUN: 64 mg/dL — AB (ref 8–23)
CALCIUM: 10.3 mg/dL (ref 8.9–10.3)
CHLORIDE: 107 mmol/L (ref 98–111)
CO2: 16 mmol/L — AB (ref 22–32)
CREATININE: 5.52 mg/dL — AB (ref 0.61–1.24)
GFR, EST AFRICAN AMERICAN: 10 mL/min — AB (ref >60)
GFR, Estimated: 9 mL/min — ABNORMAL LOW (ref >60)
GLUCOSE: 107 mg/dL — AB (ref 70–99)
Potassium: 4.5 mmol/L (ref 3.5–5.1)
SODIUM: 135 mmol/L (ref 135–145)
Total Bilirubin: 0.3 mg/dL (ref 0.3–1.2)
Total Protein: 7.3 g/dL (ref 6.5–8.1)

## 2017-10-30 LAB — CBC WITH DIFFERENTIAL/PLATELET
BASOS PCT: 0 %
Basophils Absolute: 0 10*3/uL (ref 0.0–0.1)
Eosinophils Absolute: 0.3 10*3/uL (ref 0.0–0.5)
Eosinophils Relative: 7 %
HEMATOCRIT: 34.1 % — AB (ref 38.4–49.9)
HEMOGLOBIN: 11.4 g/dL — AB (ref 13.0–17.1)
LYMPHS ABS: 1.6 10*3/uL (ref 0.9–3.3)
LYMPHS PCT: 32 %
MCH: 30.2 pg (ref 27.2–33.4)
MCHC: 33.4 g/dL (ref 32.0–36.0)
MCV: 90.5 fL (ref 79.3–98.0)
MONO ABS: 0.7 10*3/uL (ref 0.1–0.9)
MONOS PCT: 15 %
NEUTROS ABS: 2.3 10*3/uL (ref 1.5–6.5)
NEUTROS PCT: 46 %
Platelets: 266 10*3/uL (ref 140–400)
RBC: 3.77 MIL/uL — ABNORMAL LOW (ref 4.20–5.82)
RDW: 14.9 % — AB (ref 11.0–14.6)
WBC: 4.9 10*3/uL (ref 4.0–10.3)

## 2017-10-30 LAB — MAGNESIUM: Magnesium: 2 mg/dL (ref 1.7–2.4)

## 2017-10-30 LAB — RETICULOCYTES
RBC.: 3.77 MIL/uL — AB (ref 4.20–5.82)
Retic Count, Absolute: 41.5 10*3/uL (ref 34.8–93.9)
Retic Ct Pct: 1.1 % (ref 0.8–1.8)

## 2017-10-30 LAB — PHOSPHORUS: Phosphorus: 6.3 mg/dL — ABNORMAL HIGH (ref 2.5–4.6)

## 2017-10-30 LAB — LACTATE DEHYDROGENASE: LDH: 110 U/L (ref 98–192)

## 2017-10-30 MED ORDER — SODIUM CHLORIDE 0.9% FLUSH
10.0000 mL | Freq: Once | INTRAVENOUS | Status: AC
  Administered 2017-10-30: 10 mL via INTRAVENOUS
  Filled 2017-10-30: qty 10

## 2017-10-30 MED ORDER — DRONABINOL 2.5 MG PO CAPS: 2.5000 mg | ORAL_CAPSULE | Freq: Two times a day (BID) | ORAL | 0 refills | Status: DC

## 2017-10-30 MED ORDER — SODIUM CHLORIDE 0.9 % IV SOLN
INTRAVENOUS | Status: AC
  Administered 2017-10-30: 13:00:00 via INTRAVENOUS
  Filled 2017-10-30 (×2): qty 250

## 2017-10-30 MED ORDER — SODIUM CHLORIDE 0.9 % IV SOLN
Freq: Once | INTRAVENOUS | Status: AC
  Administered 2017-10-30: 15:00:00 via INTRAVENOUS
  Filled 2017-10-30: qty 250

## 2017-10-30 MED ORDER — HEPARIN SOD (PORK) LOCK FLUSH 100 UNIT/ML IV SOLN
500.0000 [IU] | Freq: Once | INTRAVENOUS | Status: AC
  Administered 2017-10-30: 500 [IU] via INTRAVENOUS
  Filled 2017-10-30: qty 5

## 2017-10-30 MED FILL — DRONABINOL 2.5 MG CAPSULE: 2.5 | 30 days supply | Qty: 60 | Fill #0

## 2017-10-30 NOTE — Patient Instructions (Signed)
Dehydration, Adult Dehydration is when there is not enough fluid or water in your body. This happens when you lose more fluids than you take in. Dehydration can range from mild to very bad. It should be treated right away to keep it from getting very bad. Symptoms of mild dehydration may include:  Thirst.  Dry lips.  Slightly dry mouth.  Dry, warm skin.  Dizziness. Symptoms of moderate dehydration may include:  Very dry mouth.  Muscle cramps.  Dark pee (urine). Pee may be the color of tea.  Your body making less pee.  Your eyes making fewer tears.  Heartbeat that is uneven or faster than normal (palpitations).  Headache.  Light-headedness, especially when you stand up from sitting.  Fainting (syncope). Symptoms of very bad dehydration may include:  Changes in skin, such as: ? Cold and clammy skin. ? Blotchy (mottled) or pale skin. ? Skin that does not quickly return to normal after being lightly pinched and let go (poor skin turgor).  Changes in body fluids, such as: ? Feeling very thirsty. ? Your eyes making fewer tears. ? Not sweating when body temperature is high, such as in hot weather. ? Your body making very little pee.  Changes in vital signs, such as: ? Weak pulse. ? Pulse that is more than 100 beats a minute when you are sitting still. ? Fast breathing. ? Low blood pressure.  Other changes, such as: ? Sunken eyes. ? Cold hands and feet. ? Confusion. ? Lack of energy (lethargy). ? Trouble waking up from sleep. ? Short-term weight loss. ? Unconsciousness. Follow these instructions at home:  If told by your doctor, drink an ORS: ? Make an ORS by using instructions on the package. ? Start by drinking small amounts, about  cup (120 mL) every 5-10 minutes. ? Slowly drink more until you have had the amount that your doctor said to have.  Drink enough clear fluid to keep your pee clear or pale yellow. If you were told to drink an ORS, finish the ORS  first, then start slowly drinking clear fluids. Drink fluids such as: ? Water. Do not drink only water by itself. Doing that can make the salt (sodium) level in your body get too low (hyponatremia). ? Ice chips. ? Fruit juice that you have added water to (diluted). ? Low-calorie sports drinks.  Avoid: ? Alcohol. ? Drinks that have a lot of sugar. These include high-calorie sports drinks, fruit juice that does not have water added, and soda. ? Caffeine. ? Foods that are greasy or have a lot of fat or sugar.  Take over-the-counter and prescription medicines only as told by your doctor.  Do not take salt tablets. Doing that can make the salt level in your body get too high (hypernatremia).  Eat foods that have minerals (electrolytes). Examples include bananas, oranges, potatoes, tomatoes, and spinach.  Keep all follow-up visits as told by your doctor. This is important. Contact a doctor if:  You have belly (abdominal) pain that: ? Gets worse. ? Stays in one area (localizes).  You have a rash.  You have a stiff neck.  You get angry or annoyed more easily than normal (irritability).  You are more sleepy than normal.  You have a harder time waking up than normal.  You feel: ? Weak. ? Dizzy. ? Very thirsty.  You have peed (urinated) only a small amount of very dark pee during 6-8 hours. Get help right away if:  You have symptoms of   very bad dehydration.  You cannot drink fluids without throwing up (vomiting).  Your symptoms get worse with treatment.  You have a fever.  You have a very bad headache.  You are throwing up or having watery poop (diarrhea) and it: ? Gets worse. ? Does not go away.  You have blood or something green (bile) in your throw-up.  You have blood in your poop (stool). This may cause poop to look black and tarry.  You have not peed in 6-8 hours.  You pass out (faint).  Your heart rate when you are sitting still is more than 100 beats a  minute.  You have trouble breathing. This information is not intended to replace advice given to you by your health care provider. Make sure you discuss any questions you have with your health care provider. Document Released: 12/08/2008 Document Revised: 09/01/2015 Document Reviewed: 04/07/2015 Elsevier Interactive Patient Education  2018 Elsevier Inc.  

## 2017-10-31 ENCOUNTER — Telehealth: Payer: Self-pay | Admitting: Hematology

## 2017-10-31 ENCOUNTER — Telehealth: Payer: Self-pay

## 2017-10-31 NOTE — Telephone Encounter (Signed)
Spoke with patient and his wife concerning his upcoming appointment. Per 9/5 los. Appointment time information was taken by his wife. Will still mail a letter of these appointment.with a calender enclosed

## 2017-10-31 NOTE — Telephone Encounter (Signed)
Returned pts call to r/s appts   °

## 2017-11-03 ENCOUNTER — Inpatient Hospital Stay: Payer: Medicare Other

## 2017-11-03 VITALS — BP 113/64 | HR 63 | Temp 98.7°F | Resp 18

## 2017-11-03 DIAGNOSIS — C8333 Diffuse large B-cell lymphoma, intra-abdominal lymph nodes: Secondary | ICD-10-CM | POA: Diagnosis not present

## 2017-11-03 DIAGNOSIS — N179 Acute kidney failure, unspecified: Secondary | ICD-10-CM

## 2017-11-03 MED ORDER — SODIUM CHLORIDE 0.9 % IV SOLN
Freq: Once | INTRAVENOUS | Status: AC
  Administered 2017-11-03: 10:00:00 via INTRAVENOUS
  Filled 2017-11-03: qty 250

## 2017-11-03 MED ORDER — HEPARIN SOD (PORK) LOCK FLUSH 100 UNIT/ML IV SOLN
500.0000 [IU] | Freq: Once | INTRAVENOUS | Status: AC
  Administered 2017-11-03: 500 [IU] via INTRAVENOUS
  Filled 2017-11-03: qty 5

## 2017-11-03 MED ORDER — SODIUM CHLORIDE 0.9% FLUSH
10.0000 mL | INTRAVENOUS | Status: DC | PRN
  Administered 2017-11-03: 10 mL via INTRAVENOUS
  Filled 2017-11-03: qty 10

## 2017-11-03 NOTE — Patient Instructions (Signed)
Dehydration, Adult Dehydration is when there is not enough fluid or water in your body. This happens when you lose more fluids than you take in. Dehydration can range from mild to very bad. It should be treated right away to keep it from getting very bad. Symptoms of mild dehydration may include:  Thirst.  Dry lips.  Slightly dry mouth.  Dry, warm skin.  Dizziness. Symptoms of moderate dehydration may include:  Very dry mouth.  Muscle cramps.  Dark pee (urine). Pee may be the color of tea.  Your body making less pee.  Your eyes making fewer tears.  Heartbeat that is uneven or faster than normal (palpitations).  Headache.  Light-headedness, especially when you stand up from sitting.  Fainting (syncope). Symptoms of very bad dehydration may include:  Changes in skin, such as: ? Cold and clammy skin. ? Blotchy (mottled) or pale skin. ? Skin that does not quickly return to normal after being lightly pinched and let go (poor skin turgor).  Changes in body fluids, such as: ? Feeling very thirsty. ? Your eyes making fewer tears. ? Not sweating when body temperature is high, such as in hot weather. ? Your body making very little pee.  Changes in vital signs, such as: ? Weak pulse. ? Pulse that is more than 100 beats a minute when you are sitting still. ? Fast breathing. ? Low blood pressure.  Other changes, such as: ? Sunken eyes. ? Cold hands and feet. ? Confusion. ? Lack of energy (lethargy). ? Trouble waking up from sleep. ? Short-term weight loss. ? Unconsciousness. Follow these instructions at home:  If told by your doctor, drink an ORS: ? Make an ORS by using instructions on the package. ? Start by drinking small amounts, about  cup (120 mL) every 5-10 minutes. ? Slowly drink more until you have had the amount that your doctor said to have.  Drink enough clear fluid to keep your pee clear or pale yellow. If you were told to drink an ORS, finish the ORS  first, then start slowly drinking clear fluids. Drink fluids such as: ? Water. Do not drink only water by itself. Doing that can make the salt (sodium) level in your body get too low (hyponatremia). ? Ice chips. ? Fruit juice that you have added water to (diluted). ? Low-calorie sports drinks.  Avoid: ? Alcohol. ? Drinks that have a lot of sugar. These include high-calorie sports drinks, fruit juice that does not have water added, and soda. ? Caffeine. ? Foods that are greasy or have a lot of fat or sugar.  Take over-the-counter and prescription medicines only as told by your doctor.  Do not take salt tablets. Doing that can make the salt level in your body get too high (hypernatremia).  Eat foods that have minerals (electrolytes). Examples include bananas, oranges, potatoes, tomatoes, and spinach.  Keep all follow-up visits as told by your doctor. This is important. Contact a doctor if:  You have belly (abdominal) pain that: ? Gets worse. ? Stays in one area (localizes).  You have a rash.  You have a stiff neck.  You get angry or annoyed more easily than normal (irritability).  You are more sleepy than normal.  You have a harder time waking up than normal.  You feel: ? Weak. ? Dizzy. ? Very thirsty.  You have peed (urinated) only a small amount of very dark pee during 6-8 hours. Get help right away if:  You have symptoms of   very bad dehydration.  You cannot drink fluids without throwing up (vomiting).  Your symptoms get worse with treatment.  You have a fever.  You have a very bad headache.  You are throwing up or having watery poop (diarrhea) and it: ? Gets worse. ? Does not go away.  You have blood or something green (bile) in your throw-up.  You have blood in your poop (stool). This may cause poop to look black and tarry.  You have not peed in 6-8 hours.  You pass out (faint).  Your heart rate when you are sitting still is more than 100 beats a  minute.  You have trouble breathing. This information is not intended to replace advice given to you by your health care provider. Make sure you discuss any questions you have with your health care provider. Document Released: 12/08/2008 Document Revised: 09/01/2015 Document Reviewed: 04/07/2015 Elsevier Interactive Patient Education  2018 Elsevier Inc.  

## 2017-11-05 ENCOUNTER — Ambulatory Visit: Payer: Medicare Other

## 2017-11-05 ENCOUNTER — Other Ambulatory Visit: Payer: Self-pay

## 2017-11-05 ENCOUNTER — Inpatient Hospital Stay: Payer: Medicare Other

## 2017-11-05 VITALS — BP 109/72 | HR 54 | Temp 97.7°F | Resp 20 | Ht 69.0 in | Wt 172.6 lb

## 2017-11-05 DIAGNOSIS — C689 Malignant neoplasm of urinary organ, unspecified: Secondary | ICD-10-CM

## 2017-11-05 DIAGNOSIS — C8333 Diffuse large B-cell lymphoma, intra-abdominal lymph nodes: Secondary | ICD-10-CM | POA: Diagnosis not present

## 2017-11-05 MED ORDER — SODIUM CHLORIDE 0.9% FLUSH
10.0000 mL | INTRAVENOUS | Status: DC | PRN
  Administered 2017-11-05: 10 mL via INTRAVENOUS
  Filled 2017-11-05: qty 10

## 2017-11-05 MED ORDER — HEPARIN SOD (PORK) LOCK FLUSH 100 UNIT/ML IV SOLN
500.0000 [IU] | Freq: Once | INTRAVENOUS | Status: AC
  Administered 2017-11-05: 500 [IU] via INTRAVENOUS
  Filled 2017-11-05: qty 5

## 2017-11-05 MED ORDER — SODIUM CHLORIDE 0.9 % IV SOLN
INTRAVENOUS | Status: AC
  Administered 2017-11-05: 13:00:00 via INTRAVENOUS
  Filled 2017-11-05 (×2): qty 250

## 2017-11-05 NOTE — Patient Instructions (Signed)
Dehydration, Adult Dehydration is when there is not enough fluid or water in your body. This happens when you lose more fluids than you take in. Dehydration can range from mild to very bad. It should be treated right away to keep it from getting very bad. Symptoms of mild dehydration may include:  Thirst.  Dry lips.  Slightly dry mouth.  Dry, warm skin.  Dizziness. Symptoms of moderate dehydration may include:  Very dry mouth.  Muscle cramps.  Dark pee (urine). Pee may be the color of tea.  Your body making less pee.  Your eyes making fewer tears.  Heartbeat that is uneven or faster than normal (palpitations).  Headache.  Light-headedness, especially when you stand up from sitting.  Fainting (syncope). Symptoms of very bad dehydration may include:  Changes in skin, such as: ? Cold and clammy skin. ? Blotchy (mottled) or pale skin. ? Skin that does not quickly return to normal after being lightly pinched and let go (poor skin turgor).  Changes in body fluids, such as: ? Feeling very thirsty. ? Your eyes making fewer tears. ? Not sweating when body temperature is high, such as in hot weather. ? Your body making very little pee.  Changes in vital signs, such as: ? Weak pulse. ? Pulse that is more than 100 beats a minute when you are sitting still. ? Fast breathing. ? Low blood pressure.  Other changes, such as: ? Sunken eyes. ? Cold hands and feet. ? Confusion. ? Lack of energy (lethargy). ? Trouble waking up from sleep. ? Short-term weight loss. ? Unconsciousness. Follow these instructions at home:  If told by your doctor, drink an ORS: ? Make an ORS by using instructions on the package. ? Start by drinking small amounts, about  cup (120 mL) every 5-10 minutes. ? Slowly drink more until you have had the amount that your doctor said to have.  Drink enough clear fluid to keep your pee clear or pale yellow. If you were told to drink an ORS, finish the ORS  first, then start slowly drinking clear fluids. Drink fluids such as: ? Water. Do not drink only water by itself. Doing that can make the salt (sodium) level in your body get too low (hyponatremia). ? Ice chips. ? Fruit juice that you have added water to (diluted). ? Low-calorie sports drinks.  Avoid: ? Alcohol. ? Drinks that have a lot of sugar. These include high-calorie sports drinks, fruit juice that does not have water added, and soda. ? Caffeine. ? Foods that are greasy or have a lot of fat or sugar.  Take over-the-counter and prescription medicines only as told by your doctor.  Do not take salt tablets. Doing that can make the salt level in your body get too high (hypernatremia).  Eat foods that have minerals (electrolytes). Examples include bananas, oranges, potatoes, tomatoes, and spinach.  Keep all follow-up visits as told by your doctor. This is important. Contact a doctor if:  You have belly (abdominal) pain that: ? Gets worse. ? Stays in one area (localizes).  You have a rash.  You have a stiff neck.  You get angry or annoyed more easily than normal (irritability).  You are more sleepy than normal.  You have a harder time waking up than normal.  You feel: ? Weak. ? Dizzy. ? Very thirsty.  You have peed (urinated) only a small amount of very dark pee during 6-8 hours. Get help right away if:  You have symptoms of   very bad dehydration.  You cannot drink fluids without throwing up (vomiting).  Your symptoms get worse with treatment.  You have a fever.  You have a very bad headache.  You are throwing up or having watery poop (diarrhea) and it: ? Gets worse. ? Does not go away.  You have blood or something green (bile) in your throw-up.  You have blood in your poop (stool). This may cause poop to look black and tarry.  You have not peed in 6-8 hours.  You pass out (faint).  Your heart rate when you are sitting still is more than 100 beats a  minute.  You have trouble breathing. This information is not intended to replace advice given to you by your health care provider. Make sure you discuss any questions you have with your health care provider. Document Released: 12/08/2008 Document Revised: 09/01/2015 Document Reviewed: 04/07/2015 Elsevier Interactive Patient Education  2018 Elsevier Inc.  

## 2017-11-06 ENCOUNTER — Inpatient Hospital Stay: Payer: Medicare Other

## 2017-11-06 ENCOUNTER — Telehealth: Payer: Self-pay

## 2017-11-06 ENCOUNTER — Inpatient Hospital Stay (HOSPITAL_BASED_OUTPATIENT_CLINIC_OR_DEPARTMENT_OTHER): Payer: Medicare Other | Admitting: Hematology

## 2017-11-06 VITALS — BP 143/80 | HR 72 | Temp 98.4°F | Resp 18 | Ht 69.0 in | Wt 174.8 lb

## 2017-11-06 DIAGNOSIS — C8333 Diffuse large B-cell lymphoma, intra-abdominal lymph nodes: Secondary | ICD-10-CM

## 2017-11-06 DIAGNOSIS — N189 Chronic kidney disease, unspecified: Secondary | ICD-10-CM | POA: Diagnosis not present

## 2017-11-06 DIAGNOSIS — N179 Acute kidney failure, unspecified: Secondary | ICD-10-CM

## 2017-11-06 DIAGNOSIS — Z85038 Personal history of other malignant neoplasm of large intestine: Secondary | ICD-10-CM

## 2017-11-06 DIAGNOSIS — E440 Moderate protein-calorie malnutrition: Secondary | ICD-10-CM

## 2017-11-06 DIAGNOSIS — C689 Malignant neoplasm of urinary organ, unspecified: Secondary | ICD-10-CM

## 2017-11-06 DIAGNOSIS — E860 Dehydration: Secondary | ICD-10-CM

## 2017-11-06 DIAGNOSIS — T8144XA Sepsis following a procedure, initial encounter: Secondary | ICD-10-CM

## 2017-11-06 DIAGNOSIS — N390 Urinary tract infection, site not specified: Secondary | ICD-10-CM

## 2017-11-06 DIAGNOSIS — R630 Anorexia: Secondary | ICD-10-CM

## 2017-11-06 DIAGNOSIS — C8330 Diffuse large B-cell lymphoma, unspecified site: Secondary | ICD-10-CM

## 2017-11-06 DIAGNOSIS — C662 Malignant neoplasm of left ureter: Secondary | ICD-10-CM | POA: Diagnosis not present

## 2017-11-06 LAB — CMP (CANCER CENTER ONLY)
ALT: <6 U/L (ref 0–44)
AST: 10 U/L — AB (ref 15–41)
Albumin: 3.1 g/dL — ABNORMAL LOW (ref 3.5–5.0)
Alkaline Phosphatase: 54 U/L (ref 38–126)
Anion gap: 8 (ref 5–15)
BUN: 26 mg/dL — AB (ref 8–23)
CO2: 20 mmol/L — ABNORMAL LOW (ref 22–32)
Calcium: 9.3 mg/dL (ref 8.9–10.3)
Chloride: 115 mmol/L — ABNORMAL HIGH (ref 98–111)
Creatinine: 2.63 mg/dL — ABNORMAL HIGH (ref 0.61–1.24)
GFR, EST AFRICAN AMERICAN: 24 mL/min — AB (ref >60)
GFR, EST NON AFRICAN AMERICAN: 21 mL/min — AB (ref >60)
Glucose, Bld: 105 mg/dL — ABNORMAL HIGH (ref 70–99)
POTASSIUM: 5.1 mmol/L (ref 3.5–5.1)
Sodium: 143 mmol/L (ref 135–145)
TOTAL PROTEIN: 6 g/dL — AB (ref 6.5–8.1)

## 2017-11-06 LAB — CBC WITH DIFFERENTIAL/PLATELET
Basophils Absolute: 0.1 10*3/uL (ref 0.0–0.1)
Basophils Relative: 1 %
EOS PCT: 5 %
Eosinophils Absolute: 0.3 10*3/uL (ref 0.0–0.5)
HEMATOCRIT: 32.4 % — AB (ref 38.4–49.9)
Hemoglobin: 10.1 g/dL — ABNORMAL LOW (ref 13.0–17.1)
LYMPHS ABS: 3.2 10*3/uL (ref 0.9–3.3)
Lymphocytes Relative: 49 %
MCH: 29.7 pg (ref 27.2–33.4)
MCHC: 31.2 g/dL — AB (ref 32.0–36.0)
MCV: 95.3 fL (ref 79.3–98.0)
MONO ABS: 0.9 10*3/uL (ref 0.1–0.9)
MONOS PCT: 14 %
NEUTROS ABS: 2 10*3/uL (ref 1.5–6.5)
Neutrophils Relative %: 31 %
PLATELETS: 214 10*3/uL (ref 140–400)
RBC: 3.4 MIL/uL — ABNORMAL LOW (ref 4.20–5.82)
RDW: 15 % — AB (ref 11.0–14.6)
WBC: 6.5 10*3/uL (ref 4.0–10.3)

## 2017-11-06 LAB — LACTATE DEHYDROGENASE: LDH: 111 U/L (ref 98–192)

## 2017-11-06 MED ORDER — DRONABINOL 5 MG PO CAPS: 5.0000 mg | ORAL_CAPSULE | Freq: Two times a day (BID) | ORAL | 0 refills | Status: DC

## 2017-11-06 NOTE — Telephone Encounter (Signed)
Printed avs and calender of upcoming appointment per 9/12 los. Patient requested to get fluids on Tuesday's instead of Monday.

## 2017-11-06 NOTE — Progress Notes (Signed)
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 male who has been referred to [place] by urologist Dr Alexis Frock for evaluation and management of newly diagnosed diffuse large B Cell Lymphoma. He is accompanied today by his wife. The pt reports that he is doing well overall.   The pt reports that in 1993 he had colon cancer with two tumors in the ascending and descending colon, which was picked up from a routine physical. He notes that he was treated with chemotherapy with Levamisole and Leukovorin for 6 months. He has not had any issues with the colon cancer since. He continue to see Dr. Cristina Gong for his GI.   He notes that he has had blood in the urine for a year and a half, picked up in a physical. This prompted his TURP surgery in July 2018. He notes that the bleeding persisted after the surgery. He notes that a stent was placed which has significantly reduced his hematuria. In February 2019 his left uretral mass was identified and subsequently biopsied confirming adenocarcinoma. The pt notes that Dr. Tresa Moore has up to this point been considering removing one of his kidneys, which would have a bearing on our treatment.   A 04/21/17 CT Abdomen identified a right retroperitoneal mass which was biopsied on 05/08/17 which revealed Large B-Cell Lymphoma.   He notes no ongoing health problems and is without physical limitations. He denies having had any blood transfusions.   On 04/23/17 the pt had a Uretral biopsy revealing Ureter, biopsy, Left mass- SUPERFICIAL FRAGMENTS OF UROTHELIAL CARCINOMA ASSOCIATED WITH GRANULATION TISSUE AND NECROSIS.   CT Abdomen of 04/21/17 revealed large right para-aortic retroperitoneal nodal  conglomeration in the abdomen, just inferior to the right renal artery. Imaging features compatible with metastatic disease in this patient with a history of prostate and left urothelial cancer. 2. Aortic atherosclerosis.   Soft Tissue Needle/core biopsy completed on 05/08/17 with results revealing Large B-Cell Lymphoma.   Most recent lab results (05/08/17) of CBC  is as follows: all values are WNL except for WBC at 11.3k, Monocytes Abs at 1.1k, CO2 at 19, Glucose at 109, Creatinine at 1.54, ALT at 16.  On review of systems, pt reports infrequent hematuria, mild back pain associated with golfing, and denies leg swelling, flank pain, fevers, chills, night sweats and any other symptoms.   On PMHx the pt reports Colon cancer in 1993, hypercholesterolemia, glaucoma, low risk prostate cancer, mastoiditis, hernia repair, carpel tunnel. He denies heart or lung problems.  On Social Hx the pt denies smoking and ETOH consumption. On Family Hx the pt reports that his mother had breast cancer when she was roughly 48, and also cervical cancer. On his mother's side, his aunts and uncles had several cancers including lung. Maternal grandfather had prostate cancer.   Interval History:  [name] was on 10/30/17. He is accompanied today by his wife. The pt reports that he is doing well overall.   The pt reports that he has tolerated IV fluids well. He notes that he is not using nutritional supplements at this time. He began marinol in the interim and notes that he might notice a small increase in his appetite. The pt  notes that he is consuming maybe up to 40 oz of water each day. He has gained 12 pounds in the last week.   The pt notes that he has not noticed any blood in the urine and has finished his course of antibiotics.   The pt notes that he completed PT this week.   Lab results today (11/06/17) of CBC w/diff, CMP is as follows: all values are WNL except for RBC at 3.40, HGB at 10.1, HCT  at 32.4, MCHC at 31.2, RDW at 15.0, Chloride at 115, CO2 at 20, Glucose at 105, BUN at 26, Creatinine at 2.63, Total Protein at 6.0, Albumin at 3.1, AST at 10, Total Bilirubin at <0.2, GFR at 21. 11/06/17 LDH is at 111  On review of systems, pt reports eating a little better, hydrating a little more, improved energy levels, weight gain and denies blood in the urine, leg swelling, abdominal pains, and any other symptoms.   MEDICAL HISTORY:  Past Medical History:  Diagnosis Date  . BPH (benign prostatic hyperplasia)   . Cancer [facility]) 1993   Colon surgery and chemo , 2-19 left ureteral camcer last chemo tx 08-27-17  . DDD (degenerative disc disease), lumbar    L4-L5  . Depression   . Early stage glaucoma    both eyes  . Hearing loss   . Hematuria, gross   . History of adenomatous polyp of colon   . History of carpal tunnel syndrome    left  . History of colon cancer    dx 1993  s/p  hemicolectomy (malignant tumor x2)  and chemo therapy completed 1993--- no recurrence per pt  . History of shingles   . HOH (hard of hearing)    right ear  . Hyperlipidemia   . Insomnia     SURGICAL HISTORY: Past Surgical History:  Procedure Laterality Date  . APPENDECTOMY    . CARPAL TUNNEL RELEASE Left 08/13/2013   Procedure: CARPAL TUNNEL RELEASE LEFT WRIST;  Surgeon: Yvette Rack., MD;  Location: [place];  Service: Orthopedics;  Laterality: Left;  . COLONOSCOPY    . CYSTOSCOPY W/ RETROGRADES Bilateral 04/03/2017   Procedure: CYSTOSCOPY WITH RETROGRADE PYELOGRAM, LEFT URETEROSCOPY, LEFT STENT PLACEMENT;  Surgeon: Franchot Gallo, MD;  Location: [facility];  Service: Urology;  Laterality: Bilateral;  . CYSTOSCOPY W/ URETERAL STENT PLACEMENT Left 04/23/2017   Procedure: CYSTOSCOPY WITH STENT REPLACEMENT;  Surgeon: Alexis Frock, MD;  Location: [facility];  Service: Urology;  Laterality: Left;  . CYSTOSCOPY WITH FULGERATION Left 04/03/2017   Procedure:  LEFT URETERAL BIOPSY;  Surgeon: Franchot Gallo, MD;  Location: [facility];  Service: Urology;  Laterality: Left;  . CYSTOSCOPY/RETROGRADE/URETEROSCOPY Left 04/23/2017   Procedure: CYSTOSCOPY/RETROGRADE/URETEROSCOPY, LEFT  URETEROSCOPY WITY  BIOPSY;  Surgeon: Alexis Frock, MD;  Location: [facility];  Service: Urology;  Laterality: Left;  . HEMICOLECTOMY  1993   malignant tumor x2 , colon cancer and Appendectomy  . INGUINAL HERNIA REPAIR Right 2003  . IR FLUORO GUIDE PORT INSERTION RIGHT  05/30/2017  . IR US GUIDE VASC ACCESS RIGHT  05/30/2017  . MASTOIDECTOMY Right 2015  . ROBOT ASSITED LAPAROSCOPIC NEPHROURETERECTOMY Left 09/24/2017   Procedure: XI ROBOT ASSITED LAPAROSCOPIC NEPHROURETERECTOMY;  Surgeon: Alexis Frock, MD;  Location: [facility];  Service: Urology;  Laterality: Left;  . ROBOTIC ASSISTED LAPAROSCOPIC LYSIS OF ADHESION  09/24/2017   Procedure: XI ROBOTIC ASSISTED LAPAROSCOPIC EXTENSIVE LYSIS OF ADHESION;  Surgeon: Alexis Frock, MD;  Location: [facility];  Service: Urology;;  . SIGMOIDOSCOPY  last one 06/ 2018  . TRANSURETHRAL RESECTION OF PROSTATE N/A 09/02/2016   Procedure: TRANSURETHRAL RESECTION OF THE PROSTATE (TURP);  Surgeon: Franchot Gallo, MD;  Location: [facility];  Service: Urology;  Laterality: N/A;    SOCIAL HISTORY: Social History   Socioeconomic History  . Marital status: Married    Spouse name: Not on file  . Number of children: Not on file  . Years of education: Not on file  . Highest education level: Not on file  Occupational History  . Not on file  Social Needs  . Financial resource strain: Not on file  . Food insecurity:    Worry: Not on file    Inability: Not on file  . Transportation needs:    Medical: Not on file    Non-medical: Not on file  Tobacco Use  . Smoking status: Never Smoker  . Smokeless tobacco: Never Used  Substance and Sexual Activity  . Alcohol use: Yes    Alcohol/week: 1.0  standard drinks    Types: 1 Glasses of wine per week    Comment: rare  . Drug use: Never  . Sexual activity: Not Currently  Lifestyle  . Physical activity:    Days per week: Not on file    Minutes per session: Not on file  . Stress: Not on file  Relationships  . Social connections:    Talks on phone: Not on file    Gets together: Not on file    Attends religious service: Not on file    Active member of club or organization: Not on file    Attends meetings of clubs or organizations: Not on file    Relationship status: Not on file  . Intimate partner violence:    Fear of current or ex partner: Not on file    Emotionally abused: Not on file    Physically abused: Not on file    Forced sexual activity: Not on file  Other Topics Concern  . Not on file  Social History Narrative  . Not on file    FAMILY HISTORY: No family history on file.  ALLERGIES:  has No Known Allergies.  MEDICATIONS:  Current Outpatient Medications  Medication Sig Dispense Refill  . cephALEXin (KEFLEX) 500 MG capsule Take 500 mg by mouth 2 (two) times daily.    [name] Kitchen dronabinol (MARINOL) 5 MG capsule Take 1 capsule (5 mg total) by mouth 2 (two) times daily before a meal. 60 capsule 0  . HYDROcodone-acetaminophen (NORCO/VICODIN) 5-325 MG tablet Take 1 tablet by mouth every 4 (four) hours as needed for moderate pain. 20 tablet 0  . latanoprost (XALATAN) 0.005 % ophthalmic solution Place 1 drop into both eyes at bedtime.    [name] Kitchen loperamide (IMODIUM) 2 MG capsule Take 1 capsule (2 mg total) by mouth every 6 (six) hours as needed for diarrhea or loose stools. 30 capsule 0  . LORazepam (ATIVAN) 0.5 MG tablet Take 1 tablet (0.5 mg total) by mouth every 6 (six) hours as needed (Nausea or vomiting). 30 tablet 0  . lovastatin (MEVACOR) 40 MG tablet Take 40 mg by mouth every evening.    . midodrine (PROAMATINE) 2.5 MG tablet Take 1 tablet (2.5 mg total) by mouth 2 (two) times daily with a meal. 60 tablet 0  . mirtazapine  (REMERON SOL-TAB) 15 MG disintegrating tablet Take 0.5 tablets (7.5 mg total) by mouth at bedtime. 30 tablet 0  . ondansetron (  ZOFRAN) 8 MG tablet Take 1 tablet (8 mg total) by mouth 2 (two) times daily as needed for refractory nausea / vomiting. Start on day 3 after cytoxan chemorx 30 tablet 1  . saccharomyces boulardii (FLORASTOR) 250 MG capsule Take 1 capsule (250 mg total) by mouth 2 (two) times daily. 60 capsule 0  . sertraline (ZOLOFT) 25 MG tablet Take 25 mg by mouth every morning.      No current facility-administered medications for this visit.     REVIEW OF SYSTEMS:    .10 Point review of Systems was done is negative except as noted above.   PHYSICAL EXAMINATION: ECOG PERFORMANCE STATUS: 1 - Symptomatic but completely ambulatory  Vitals:   11/06/17 0950  BP: (!) 143/80  Pulse: 72  Resp: 18  Temp: 98.4 F (36.9 C)  SpO2: 100%   Filed Weights   11/06/17 0950  Weight: 174 lb 12.8 oz (79.3 kg)   .Body mass index is 25.81 kg/m. [name] Kitchen GENERAL:alert, in no acute distress and comfortable SKIN: no acute rashes, no significant lesions EYES: conjunctiva are pink and non-injected, sclera anicteric OROPHARYNX: MMM, no exudates, no oropharyngeal erythema or ulceration NECK: supple, no JVD LYMPH:  no palpable lymphadenopathy in the cervical, axillary or inguinal regions LUNGS: clear to auscultation b/l with normal respiratory effort HEART: regular rate & rhythm ABDOMEN:  normoactive bowel sounds , non tender, not distended. Extremity: no pedal edema PSYCH: alert & oriented x 3 with fluent speech NEURO: no focal motor/sensory deficits   LABORATORY DATA:  I have reviewed the data as listed  . CBC Latest Ref Rng & Units 11/06/2017 10/30/2017 10/13/2017  WBC 4.0 - 10.3 K/uL 6.5 4.9 6.8  Hemoglobin 13.0 - 17.1 g/dL 10.1(L) 11.4(L) 8.3(L)  Hematocrit 38.4 - 49.9 % 32.4(L) 34.1(L) 26.2(L)  Platelets 140 - 400 K/uL 214 266 295    . CMP Latest Ref Rng & Units 11/06/2017 10/30/2017  10/14/2017  Glucose 70 - 99 mg/dL 105(H) 107(H) 90  BUN 8 - 23 mg/dL 26(H) 64(H) 22  Creatinine 0.61 - 1.24 mg/dL 2.63(H) 5.52(HH) 2.37(H)  Sodium 135 - 145 mmol/L 143 135 142  Potassium 3.5 - 5.1 mmol/L 5.1 4.5 3.7  Chloride 98 - 111 mmol/L 115(H) 107 123(H)  CO2 22 - 32 mmol/L 20(L) 16(L) 14(L)  Calcium 8.9 - 10.3 mg/dL 9.3 10.3 9.0  Total Protein 6.5 - 8.1 g/dL 6.0(L) 7.3 -  Total Bilirubin 0.3 - 1.2 mg/dL <0.2(L) 0.3 -  Alkaline Phos 38 - 126 U/L 54 82 -  AST 15 - 41 U/L 10(L) 10(L) -  ALT 0 - 44 U/L <6 9 -    Component     Latest Ref Rng & Units 05/15/2017  Retic Ct Pct     0.8 - 1.8 % 1.6  RBC.     4.20 - 5.82 MIL/uL 4.62  Retic Count, Absolute     34.8 - 93.9 K/uL 73.9  Prothrombin Time     11.4 - 15.2 seconds 14.5  INR      1.13  APTT     24 - 36 seconds 31  HIV Screen 4th Generation wRfx     Non Reactive Non Reactive  HCV Ab     0.0 - 0.9 s/co ratio <0.1  Hep B Core Ab, Tot     Negative Negative  Hepatitis B Surface Ag     Negative Negative  LDH     125 - 245 U/L 132   . Lab Results  Component  Value Date   LDH 111 11/06/2017     05/08/17 Soft Tissue Needle Core Biopsy:   04/23/17 Ureter Biopsy:   RADIOGRAPHIC STUDIES: I have personally reviewed the radiological images as listed and agreed with the findings in the report. No results found. 04/21/17 CT Abdomen   ASSESSMENT & PLAN:   84 y.o. male with  1. Recently diagnosed Diffuse Large B Cell Lymphoma - Stage II -bulky  05/26/17 PET/CT which showed 1. 5 cm right-sided retroperitoneal nodal mass is hypermetabolic and consistent with known lymphoma. Small lymph node just below the aortic bifurcation is also hypermetabolic. -ECHO shows normal EF.  S/p 4 cycles of R-mini CHOP 07/28/17 PET/CT which revealed Significant response to interval therapy. The large retroperitoneal mass is significantly smaller with minimal residual activity, slightly greater than blood pool ([place] 3). No evidence of  disease progression.  -Recommend Ensure/boost for nutrition supplement PLAN: -we will consider the role of RT after rpt imaging 6-8 weeks post op   2. 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  -s/p left nephroureterectomy pm 09/24/2017 PLAN: -continue urologic f/u with Dr Tresa Moore.  3. S/p post operative Acute UTI with E.coli with sepsis and dehydration 4. AKI on CKD- single kidney. - likely due to poor po intake- creatinine improved with IVF 5. Protein calorie malnutrition-moderate 6. Diarrhea - neg GI panel -- resolved  PLAN  -Discussed pt labwork today, 11/06/17; Creatinine has gone back to his baseline at 2.63 -Hold 11/10/17 US Abdomen as creatinine has reduced from 5.52 to 2.63 -Increased marinol to 5mg  BID to boost appetite -Repeat PET/CT in the next 4-6 weeks  -Continue with IVF twice a week for two more weeks -Discussed a plan to increase daily water consumption to 2 liters and then taking pt off of IVF support -Recommended that the pt eat well, drink at least 48-64 oz of water each day, and walk 20-30 minutes each day. -Avoid NSAIDs, infections and dehydration to protect kidneys -Tylenol if necessary -Discussed that as the pt began IVF and gained 12 pounds in one week, he should monitor his weight for dehydration if his weight drops beneath his current 175 pounds -Track BP twice a day, morning and evening and follow up with cardiology for BP medication management -Will see the pt back in 3 weeks  IV fluids Monday and Friday for 2 weeks RTC with Dr Irene Limbo with labs in 3 weeks   . The total time spent in the appointment was 20 minutes and more than 50% was on counseling and direct patient cares.   All of the patients questions were answered with apparent satisfaction. The patient knows to call the clinic with any problems, questions or concerns.   Sullivan Lone MD MS AAHIVMS [facility]  [facility] Hematology/Oncology Physician [facility]  (Office):       [phone] (Work cell):  [phone] (Fax):           [phone]  11/10/2017 5:23 PM  I, Baldwin [place], am acting as a scribe for Dr. Irene Limbo   .I have reviewed the above documentation for accuracy and completeness, and I agree with the above. Brunetta Genera MD

## 2017-11-07 ENCOUNTER — Other Ambulatory Visit: Payer: Self-pay

## 2017-11-07 ENCOUNTER — Inpatient Hospital Stay: Payer: Medicare Other

## 2017-11-07 VITALS — BP 116/81 | HR 60 | Temp 98.7°F | Resp 18

## 2017-11-07 DIAGNOSIS — C9002 Multiple myeloma in relapse: Secondary | ICD-10-CM

## 2017-11-07 DIAGNOSIS — C8330 Diffuse large B-cell lymphoma, unspecified site: Secondary | ICD-10-CM

## 2017-11-07 DIAGNOSIS — Z95828 Presence of other vascular implants and grafts: Secondary | ICD-10-CM

## 2017-11-07 DIAGNOSIS — C8333 Diffuse large B-cell lymphoma, intra-abdominal lymph nodes: Secondary | ICD-10-CM | POA: Diagnosis not present

## 2017-11-07 MED ORDER — SODIUM CHLORIDE 0.9% FLUSH
10.0000 mL | Freq: Once | INTRAVENOUS | Status: AC
  Administered 2017-11-07: 10 mL
  Filled 2017-11-07: qty 10

## 2017-11-07 MED ORDER — HEPARIN SOD (PORK) LOCK FLUSH 100 UNIT/ML IV SOLN
500.0000 [IU] | Freq: Once | INTRAVENOUS | Status: AC
  Administered 2017-11-07: 500 [IU]
  Filled 2017-11-07: qty 5

## 2017-11-07 MED ORDER — SODIUM CHLORIDE 0.9 % IV SOLN
INTRAVENOUS | Status: DC
  Administered 2017-11-07: 15:00:00 via INTRAVENOUS
  Filled 2017-11-07 (×2): qty 250

## 2017-11-07 NOTE — Patient Instructions (Addendum)
Dehydration, Adult Dehydration is when there is not enough fluid or water in your body. This happens when you lose more fluids than you take in. Dehydration can range from mild to very bad. It should be treated right away to keep it from getting very bad. Symptoms of mild dehydration may include:  Thirst.  Dry lips.  Slightly dry mouth.  Dry, warm skin.  Dizziness. Symptoms of moderate dehydration may include:  Very dry mouth.  Muscle cramps.  Dark pee (urine). Pee may be the color of tea.  Your body making less pee.  Your eyes making fewer tears.  Heartbeat that is uneven or faster than normal (palpitations).  Headache.  Light-headedness, especially when you stand up from sitting.  Fainting (syncope). Symptoms of very bad dehydration may include:  Changes in skin, such as: ? Cold and clammy skin. ? Blotchy (mottled) or pale skin. ? Skin that does not quickly return to normal after being lightly pinched and let go (poor skin turgor).  Changes in body fluids, such as: ? Feeling very thirsty. ? Your eyes making fewer tears. ? Not sweating when body temperature is high, such as in hot weather. ? Your body making very little pee.  Changes in vital signs, such as: ? Weak pulse. ? Pulse that is more than 100 beats a minute when you are sitting still. ? Fast breathing. ? Low blood pressure.  Other changes, such as: ? Sunken eyes. ? Cold hands and feet. ? Confusion. ? Lack of energy (lethargy). ? Trouble waking up from sleep. ? Short-term weight loss. ? Unconsciousness. Follow these instructions at home:  If told by your doctor, drink an ORS: ? Make an ORS by using instructions on the package. ? Start by drinking small amounts, about  cup (120 mL) every 5-10 minutes. ? Slowly drink more until you have had the amount that your doctor said to have.  Drink enough clear fluid to keep your pee clear or pale yellow. If you were told to drink an ORS, finish the ORS  first, then start slowly drinking clear fluids. Drink fluids such as: ? Water. Do not drink only water by itself. Doing that can make the salt (sodium) level in your body get too low (hyponatremia). ? Ice chips. ? Fruit juice that you have added water to (diluted). ? Low-calorie sports drinks.  Avoid: ? Alcohol. ? Drinks that have a lot of sugar. These include high-calorie sports drinks, fruit juice that does not have water added, and soda. ? Caffeine. ? Foods that are greasy or have a lot of fat or sugar.  Take over-the-counter and prescription medicines only as told by your doctor.  Do not take salt tablets. Doing that can make the salt level in your body get too high (hypernatremia).  Eat foods that have minerals (electrolytes). Examples include bananas, oranges, potatoes, tomatoes, and spinach.  Keep all follow-up visits as told by your doctor. This is important. Contact a doctor if:  You have belly (abdominal) pain that: ? Gets worse. ? Stays in one area (localizes).  You have a rash.  You have a stiff neck.  You get angry or annoyed more easily than normal (irritability).  You are more sleepy than normal.  You have a harder time waking up than normal.  You feel: ? Weak. ? Dizzy. ? Very thirsty.  You have peed (urinated) only a small amount of very dark pee during 6-8 hours. Get help right away if:  You have symptoms of   very bad dehydration.  You cannot drink fluids without throwing up (vomiting).  Your symptoms get worse with treatment.  You have a fever.  You have a very bad headache.  You are throwing up or having watery poop (diarrhea) and it: ? Gets worse. ? Does not go away.  You have blood or something green (bile) in your throw-up.  You have blood in your poop (stool). This may cause poop to look black and tarry.  You have not peed in 6-8 hours.  You pass out (faint).  Your heart rate when you are sitting still is more than 100 beats a  minute.  You have trouble breathing. This information is not intended to replace advice given to you by your health care provider. Make sure you discuss any questions you have with your health care provider. Document Released: 12/08/2008 Document Revised: 09/01/2015 Document Reviewed: 04/07/2015 Elsevier Interactive Patient Education  2018 [employer]  Dehydration, Adult Dehydration is when there is not enough fluid or water in your body. This happens when you lose more fluids than you take in. Dehydration can range from mild to very bad. It should be treated right away to keep it from getting very bad. Symptoms of mild dehydration may include:  Thirst.  Dry lips.  Slightly dry mouth.  Dry, warm skin.  Dizziness. Symptoms of moderate dehydration may include:  Very dry mouth.  Muscle cramps.  Dark pee (urine). Pee may be the color of tea.  Your body making less pee.  Your eyes making fewer tears.  Heartbeat that is uneven or faster than normal (palpitations).  Headache.  Light-headedness, especially when you stand up from sitting.  Fainting (syncope). Symptoms of very bad dehydration may include:  Changes in skin, such as: ? Cold and clammy skin. ? Blotchy (mottled) or pale skin. ? Skin that does not quickly return to normal after being lightly pinched and let go (poor skin turgor).  Changes in body fluids, such as: ? Feeling very thirsty. ? Your eyes making fewer tears. ? Not sweating when body temperature is high, such as in hot weather. ? Your body making very little pee.  Changes in vital signs, such as: ? Weak pulse. ? Pulse that is more than 100 beats a minute when you are sitting still. ? Fast breathing. ? Low blood pressure.  Other changes, such as: ? Sunken eyes. ? Cold hands and feet. ? Confusion. ? Lack of energy (lethargy). ? Trouble waking up from sleep. ? Short-term weight loss. ? Unconsciousness. Follow these instructions at  home:  If told by your doctor, drink an ORS: ? Make an ORS by using instructions on the package. ? Start by drinking small amounts, about  cup (120 mL) every 5-10 minutes. ? Slowly drink more until you have had the amount that your doctor said to have.  Drink enough clear fluid to keep your pee clear or pale yellow. If you were told to drink an ORS, finish the ORS first, then start slowly drinking clear fluids. Drink fluids such as: ? Water. Do not drink only water by itself. Doing that can make the salt (sodium) level in your body get too low (hyponatremia). ? Ice chips. ? Fruit juice that you have added water to (diluted). ? Low-calorie sports drinks.  Avoid: ? Alcohol. ? Drinks that have a lot of sugar. These include high-calorie sports drinks, fruit juice that does not have water added, and soda. ? Caffeine. ? Foods that are greasy or have   a lot of fat or sugar.  Take over-the-counter and prescription medicines only as told by your doctor.  Do not take salt tablets. Doing that can make the salt level in your body get too high (hypernatremia).  Eat foods that have minerals (electrolytes). Examples include bananas, oranges, potatoes, tomatoes, and spinach.  Keep all follow-up visits as told by your doctor. This is important. Contact a doctor if:  You have belly (abdominal) pain that: ? Gets worse. ? Stays in one area (localizes).  You have a rash.  You have a stiff neck.  You get angry or annoyed more easily than normal (irritability).  You are more sleepy than normal.  You have a harder time waking up than normal.  You feel: ? Weak. ? Dizzy. ? Very thirsty.  You have peed (urinated) only a small amount of very dark pee during 6-8 hours. Get help right away if:  You have symptoms of very bad dehydration.  You cannot drink fluids without throwing up (vomiting).  Your symptoms get worse with treatment.  You have a fever.  You have a very bad headache.  You  are throwing up or having watery poop (diarrhea) and it: ? Gets worse. ? Does not go away.  You have blood or something green (bile) in your throw-up.  You have blood in your poop (stool). This may cause poop to look black and tarry.  You have not peed in 6-8 hours.  You pass out (faint).  Your heart rate when you are sitting still is more than 100 beats a minute.  You have trouble breathing. This information is not intended to replace advice given to you by your health care provider. Make sure you discuss any questions you have with your health care provider. Document Released: 12/08/2008 Document Revised: 09/01/2015 Document Reviewed: 04/07/2015 Elsevier Interactive Patient Education  2018 [employer]  

## 2017-11-10 ENCOUNTER — Inpatient Hospital Stay: Payer: Medicare Other | Admitting: Nutrition

## 2017-11-10 ENCOUNTER — Ambulatory Visit (HOSPITAL_COMMUNITY): Payer: Medicare Other

## 2017-11-11 ENCOUNTER — Ambulatory Visit (HOSPITAL_COMMUNITY)
Admission: RE | Admit: 2017-11-11 | Discharge: 2017-11-11 | Disposition: A | Discharge status: Home / Self Care | Payer: Medicare Other | Source: Ambulatory Visit | Attending: Internal Medicine | Admitting: Internal Medicine

## 2017-11-11 ENCOUNTER — Other Ambulatory Visit: Payer: Self-pay | Admitting: Hematology

## 2017-11-11 DIAGNOSIS — E860 Dehydration: Secondary | ICD-10-CM

## 2017-11-11 DIAGNOSIS — C662 Malignant neoplasm of left ureter: Secondary | ICD-10-CM | POA: Insufficient documentation

## 2017-11-11 DIAGNOSIS — C8333 Diffuse large B-cell lymphoma, intra-abdominal lymph nodes: Secondary | ICD-10-CM | POA: Insufficient documentation

## 2017-11-11 MED ORDER — SODIUM CHLORIDE 0.9 % IV BOLUS
1000.0000 mL | Freq: Once | INTRAVENOUS | Status: AC
  Administered 2017-11-11: 1000 mL via INTRAVENOUS

## 2017-11-11 MED ORDER — SODIUM CHLORIDE 0.9% FLUSH
10.0000 mL | INTRAVENOUS | Status: AC | PRN
  Administered 2017-11-11: 10 mL

## 2017-11-11 MED ORDER — HEPARIN SOD (PORK) LOCK FLUSH 100 UNIT/ML IV SOLN
500.0000 [IU] | INTRAVENOUS | Status: AC | PRN
  Administered 2017-11-11: 500 [IU]

## 2017-11-11 MED ORDER — SODIUM CHLORIDE 0.9 % IV SOLN
INTRAVENOUS | Status: AC
  Filled 2017-11-11: qty 250

## 2017-11-11 NOTE — Progress Notes (Unsigned)
Normal

## 2017-11-11 NOTE — Discharge Instructions (Signed)
Dehydration, Adult Dehydration is when there is not enough fluid or water in your body. This happens when you lose more fluids than you take in. Dehydration can range from mild to very bad. It should be treated right away to keep it from getting very bad. Symptoms of mild dehydration may include:  Thirst.  Dry lips.  Slightly dry mouth.  Dry, warm skin.  Dizziness. Symptoms of moderate dehydration may include:  Very dry mouth.  Muscle cramps.  Dark pee (urine). Pee may be the color of tea.  Your body making less pee.  Your eyes making fewer tears.  Heartbeat that is uneven or faster than normal (palpitations).  Headache.  Light-headedness, especially when you stand up from sitting.  Fainting (syncope). Symptoms of very bad dehydration may include:  Changes in skin, such as: ? Cold and clammy skin. ? Blotchy (mottled) or pale skin. ? Skin that does not quickly return to normal after being lightly pinched and let go (poor skin turgor).  Changes in body fluids, such as: ? Feeling very thirsty. ? Your eyes making fewer tears. ? Not sweating when body temperature is high, such as in hot weather. ? Your body making very little pee.  Changes in vital signs, such as: ? Weak pulse. ? Pulse that is more than 100 beats a minute when you are sitting still. ? Fast breathing. ? Low blood pressure.  Other changes, such as: ? Sunken eyes. ? Cold hands and feet. ? Confusion. ? Lack of energy (lethargy). ? Trouble waking up from sleep. ? Short-term weight loss. ? Unconsciousness. Follow these instructions at home:  If told by your doctor, drink an ORS: ? Make an ORS by using instructions on the package. ? Start by drinking small amounts, about  cup (120 mL) every 5-10 minutes. ? Slowly drink more until you have had the amount that your doctor said to have.  Drink enough clear fluid to keep your pee clear or pale yellow. If you were told to drink an ORS, finish the ORS  first, then start slowly drinking clear fluids. Drink fluids such as: ? Water. Do not drink only water by itself. Doing that can make the salt (sodium) level in your body get too low (hyponatremia). ? Ice chips. ? Fruit juice that you have added water to (diluted). ? Low-calorie sports drinks.  Avoid: ? Alcohol. ? Drinks that have a lot of sugar. These include high-calorie sports drinks, fruit juice that does not have water added, and soda. ? Caffeine. ? Foods that are greasy or have a lot of fat or sugar.  Take over-the-counter and prescription medicines only as told by your doctor.  Do not take salt tablets. Doing that can make the salt level in your body get too high (hypernatremia).  Eat foods that have minerals (electrolytes). Examples include bananas, oranges, potatoes, tomatoes, and spinach.  Keep all follow-up visits as told by your doctor. This is important. Contact a doctor if:  You have belly (abdominal) pain that: ? Gets worse. ? Stays in one area (localizes).  You have a rash.  You have a stiff neck.  You get angry or annoyed more easily than normal (irritability).  You are more sleepy than normal.  You have a harder time waking up than normal.  You feel: ? Weak. ? Dizzy. ? Very thirsty.  You have peed (urinated) only a small amount of very dark pee during 6-8 hours. Get help right away if:  You have symptoms of   very bad dehydration.  You cannot drink fluids without throwing up (vomiting).  Your symptoms get worse with treatment.  You have a fever.  You have a very bad headache.  You are throwing up or having watery poop (diarrhea) and it: ? Gets worse. ? Does not go away.  You have blood or something green (bile) in your throw-up.  You have blood in your poop (stool). This may cause poop to look black and tarry.  You have not peed in 6-8 hours.  You pass out (faint).  Your heart rate when you are sitting still is more than 100 beats a  minute.  You have trouble breathing. This information is not intended to replace advice given to you by your health care provider. Make sure you discuss any questions you have with your health care provider. Document Released: 12/08/2008 Document Revised: 09/01/2015 Document Reviewed: 04/07/2015 Elsevier Interactive Patient Education  2018 [employer]  

## 2017-11-11 NOTE — Progress Notes (Signed)
PATIENT [facility]ARE [facility] NOTE  Provider: Dr. Irene Limbo   Procedure: 1 liter Normal Saline bolus    Note: Patient received 1 Liter bolus of 0.9% Sodium Chloride over 2 hours. Patient tolerated bolus well with no adverse reaction. Vital signs remained stable. Discharge instructions given. Patient alert, oriented and ambulatory at discharge.

## 2017-11-12 ENCOUNTER — Other Ambulatory Visit: Payer: Self-pay | Admitting: Hematology

## 2017-11-12 DIAGNOSIS — C689 Malignant neoplasm of urinary organ, unspecified: Secondary | ICD-10-CM

## 2017-11-12 DIAGNOSIS — C8330 Diffuse large B-cell lymphoma, unspecified site: Secondary | ICD-10-CM

## 2017-11-12 NOTE — Progress Notes (Unsigned)
Walker

## 2017-11-14 ENCOUNTER — Other Ambulatory Visit: Payer: Self-pay

## 2017-11-14 ENCOUNTER — Ambulatory Visit (HOSPITAL_COMMUNITY)
Admission: RE | Admit: 2017-11-14 | Discharge: 2017-11-14 | Disposition: A | Discharge status: Home / Self Care | Payer: Medicare Other | Source: Ambulatory Visit | Attending: Hematology | Admitting: Hematology

## 2017-11-14 DIAGNOSIS — E860 Dehydration: Secondary | ICD-10-CM

## 2017-11-14 DIAGNOSIS — C8333 Diffuse large B-cell lymphoma, intra-abdominal lymph nodes: Secondary | ICD-10-CM | POA: Diagnosis not present

## 2017-11-14 MED ORDER — SODIUM CHLORIDE 0.9 % IV SOLN
INTRAVENOUS | Status: DC
  Administered 2017-11-14: 10:00:00 via INTRAVENOUS

## 2017-11-14 MED ORDER — SODIUM CHLORIDE 0.9% FLUSH: 10.0000 mL | INTRAVENOUS | Status: DC | PRN

## 2017-11-14 MED ORDER — HEPARIN SOD (PORK) LOCK FLUSH 100 UNIT/ML IV SOLN
500.0000 [IU] | INTRAVENOUS | Status: AC | PRN
  Administered 2017-11-14: 500 [IU]
  Filled 2017-11-14: qty 5

## 2017-11-14 NOTE — Discharge Instructions (Signed)
Dehydration, Adult Dehydration is when there is not enough fluid or water in your body. This happens when you lose more fluids than you take in. Dehydration can range from mild to very bad. It should be treated right away to keep it from getting very bad. Symptoms of mild dehydration may include:  Thirst.  Dry lips.  Slightly dry mouth.  Dry, warm skin.  Dizziness. Symptoms of moderate dehydration may include:  Very dry mouth.  Muscle cramps.  Dark pee (urine). Pee may be the color of tea.  Your body making less pee.  Your eyes making fewer tears.  Heartbeat that is uneven or faster than normal (palpitations).  Headache.  Light-headedness, especially when you stand up from sitting.  Fainting (syncope). Symptoms of very bad dehydration may include:  Changes in skin, such as: ? Cold and clammy skin. ? Blotchy (mottled) or pale skin. ? Skin that does not quickly return to normal after being lightly pinched and let go (poor skin turgor).  Changes in body fluids, such as: ? Feeling very thirsty. ? Your eyes making fewer tears. ? Not sweating when body temperature is high, such as in hot weather. ? Your body making very little pee.  Changes in vital signs, such as: ? Weak pulse. ? Pulse that is more than 100 beats a minute when you are sitting still. ? Fast breathing. ? Low blood pressure.  Other changes, such as: ? Sunken eyes. ? Cold hands and feet. ? Confusion. ? Lack of energy (lethargy). ? Trouble waking up from sleep. ? Short-term weight loss. ? Unconsciousness. Follow these instructions at home:  If told by your doctor, drink an ORS: ? Make an ORS by using instructions on the package. ? Start by drinking small amounts, about  cup (120 mL) every 5-10 minutes. ? Slowly drink more until you have had the amount that your doctor said to have.  Drink enough clear fluid to keep your pee clear or pale yellow. If you were told to drink an ORS, finish the ORS  first, then start slowly drinking clear fluids. Drink fluids such as: ? Water. Do not drink only water by itself. Doing that can make the salt (sodium) level in your body get too low (hyponatremia). ? Ice chips. ? Fruit juice that you have added water to (diluted). ? Low-calorie sports drinks.  Avoid: ? Alcohol. ? Drinks that have a lot of sugar. These include high-calorie sports drinks, fruit juice that does not have water added, and soda. ? Caffeine. ? Foods that are greasy or have a lot of fat or sugar.  Take over-the-counter and prescription medicines only as told by your doctor.  Do not take salt tablets. Doing that can make the salt level in your body get too high (hypernatremia).  Eat foods that have minerals (electrolytes). Examples include bananas, oranges, potatoes, tomatoes, and spinach.  Keep all follow-up visits as told by your doctor. This is important. Contact a doctor if:  You have belly (abdominal) pain that: ? Gets worse. ? Stays in one area (localizes).  You have a rash.  You have a stiff neck.  You get angry or annoyed more easily than normal (irritability).  You are more sleepy than normal.  You have a harder time waking up than normal.  You feel: ? Weak. ? Dizzy. ? Very thirsty.  You have peed (urinated) only a small amount of very dark pee during 6-8 hours. Get help right away if:  You have symptoms of   very bad dehydration.  You cannot drink fluids without throwing up (vomiting).  Your symptoms get worse with treatment.  You have a fever.  You have a very bad headache.  You are throwing up or having watery poop (diarrhea) and it: ? Gets worse. ? Does not go away.  You have blood or something green (bile) in your throw-up.  You have blood in your poop (stool). This may cause poop to look black and tarry.  You have not peed in 6-8 hours.  You pass out (faint).  Your heart rate when you are sitting still is more than 100 beats a  minute.  You have trouble breathing. This information is not intended to replace advice given to you by your health care provider. Make sure you discuss any questions you have with your health care provider. Document Released: 12/08/2008 Document Revised: 09/01/2015 Document Reviewed: 04/07/2015 Elsevier Interactive Patient Education  2018 [employer]  

## 2017-11-14 NOTE — Progress Notes (Signed)
Normal

## 2017-11-14 NOTE — Progress Notes (Signed)
Patient received 1 Liter 0.9% Sodium chloride via an implanted port as prescibed. Tolerated well, no adverse reactions, vitals stable, discharge instructions given, verbalized understanding. Patient alert, oriented and ambulatory at the time of discharge.

## 2017-11-17 MED FILL — DRONABINOL 5 MG CAP: 5 | 30 days supply | Qty: 60 | Fill #0

## 2017-11-18 ENCOUNTER — Ambulatory Visit (HOSPITAL_COMMUNITY)
Admission: RE | Admit: 2017-11-18 | Discharge: 2017-11-18 | Disposition: A | Discharge status: Home / Self Care | Payer: Medicare Other | Source: Ambulatory Visit | Attending: Hematology | Admitting: Hematology

## 2017-11-18 DIAGNOSIS — C8333 Diffuse large B-cell lymphoma, intra-abdominal lymph nodes: Secondary | ICD-10-CM | POA: Diagnosis not present

## 2017-11-18 MED ORDER — HEPARIN SOD (PORK) LOCK FLUSH 100 UNIT/ML IV SOLN
500.0000 [IU] | INTRAVENOUS | Status: AC | PRN
  Administered 2017-11-18: 500 [IU]
  Filled 2017-11-18: qty 5

## 2017-11-18 MED ORDER — SODIUM CHLORIDE 0.9% FLUSH
10.0000 mL | INTRAVENOUS | Status: AC | PRN
  Administered 2017-11-18: 10 mL

## 2017-11-18 MED ORDER — SODIUM CHLORIDE 0.9 % IV SOLN
Freq: Once | INTRAVENOUS | Status: AC
  Administered 2017-11-18: 11:00:00 via INTRAVENOUS

## 2017-11-18 NOTE — Progress Notes (Signed)
Patient received 1 Liter 0.9% Sodium chloride via an implanted port as prescibed. Tolerated well, no adverse reactions, vitals stable, discharge instructions given, verbalized understanding. Patient alert, oriented and ambulatory at the time of discharge.

## 2017-11-18 NOTE — Discharge Instructions (Signed)
Dehydration, Adult Dehydration is when there is not enough fluid or water in your body. This happens when you lose more fluids than you take in. Dehydration can range from mild to very bad. It should be treated right away to keep it from getting very bad. Symptoms of mild dehydration may include:  Thirst.  Dry lips.  Slightly dry mouth.  Dry, warm skin.  Dizziness. Symptoms of moderate dehydration may include:  Very dry mouth.  Muscle cramps.  Dark pee (urine). Pee may be the color of tea.  Your body making less pee.  Your eyes making fewer tears.  Heartbeat that is uneven or faster than normal (palpitations).  Headache.  Light-headedness, especially when you stand up from sitting.  Fainting (syncope). Symptoms of very bad dehydration may include:  Changes in skin, such as: ? Cold and clammy skin. ? Blotchy (mottled) or pale skin. ? Skin that does not quickly return to normal after being lightly pinched and let go (poor skin turgor).  Changes in body fluids, such as: ? Feeling very thirsty. ? Your eyes making fewer tears. ? Not sweating when body temperature is high, such as in hot weather. ? Your body making very little pee.  Changes in vital signs, such as: ? Weak pulse. ? Pulse that is more than 100 beats a minute when you are sitting still. ? Fast breathing. ? Low blood pressure.  Other changes, such as: ? Sunken eyes. ? Cold hands and feet. ? Confusion. ? Lack of energy (lethargy). ? Trouble waking up from sleep. ? Short-term weight loss. ? Unconsciousness. Follow these instructions at home:  If told by your doctor, drink an ORS: ? Make an ORS by using instructions on the package. ? Start by drinking small amounts, about  cup (120 mL) every 5-10 minutes. ? Slowly drink more until you have had the amount that your doctor said to have.  Drink enough clear fluid to keep your pee clear or pale yellow. If you were told to drink an ORS, finish the ORS  first, then start slowly drinking clear fluids. Drink fluids such as: ? Water. Do not drink only water by itself. Doing that can make the salt (sodium) level in your body get too low (hyponatremia). ? Ice chips. ? Fruit juice that you have added water to (diluted). ? Low-calorie sports drinks.  Avoid: ? Alcohol. ? Drinks that have a lot of sugar. These include high-calorie sports drinks, fruit juice that does not have water added, and soda. ? Caffeine. ? Foods that are greasy or have a lot of fat or sugar.  Take over-the-counter and prescription medicines only as told by your doctor.  Do not take salt tablets. Doing that can make the salt level in your body get too high (hypernatremia).  Eat foods that have minerals (electrolytes). Examples include bananas, oranges, potatoes, tomatoes, and spinach.  Keep all follow-up visits as told by your doctor. This is important. Contact a doctor if:  You have belly (abdominal) pain that: ? Gets worse. ? Stays in one area (localizes).  You have a rash.  You have a stiff neck.  You get angry or annoyed more easily than normal (irritability).  You are more sleepy than normal.  You have a harder time waking up than normal.  You feel: ? Weak. ? Dizzy. ? Very thirsty.  You have peed (urinated) only a small amount of very dark pee during 6-8 hours. Get help right away if:  You have symptoms of   very bad dehydration.  You cannot drink fluids without throwing up (vomiting).  Your symptoms get worse with treatment.  You have a fever.  You have a very bad headache.  You are throwing up or having watery poop (diarrhea) and it: ? Gets worse. ? Does not go away.  You have blood or something green (bile) in your throw-up.  You have blood in your poop (stool). This may cause poop to look black and tarry.  You have not peed in 6-8 hours.  You pass out (faint).  Your heart rate when you are sitting still is more than 100 beats a  minute.  You have trouble breathing. This information is not intended to replace advice given to you by your health care provider. Make sure you discuss any questions you have with your health care provider. Document Released: 12/08/2008 Document Revised: 09/01/2015 Document Reviewed: 04/07/2015 Elsevier Interactive Patient Education  2018 [employer]  

## 2017-11-20 ENCOUNTER — Other Ambulatory Visit: Payer: Self-pay | Admitting: Urology

## 2017-11-21 ENCOUNTER — Ambulatory Visit (HOSPITAL_COMMUNITY)
Admission: RE | Admit: 2017-11-21 | Discharge: 2017-11-21 | Disposition: A | Discharge status: Home / Self Care | Payer: Medicare Other | Source: Ambulatory Visit | Attending: Hematology | Admitting: Hematology

## 2017-11-21 DIAGNOSIS — C8333 Diffuse large B-cell lymphoma, intra-abdominal lymph nodes: Secondary | ICD-10-CM | POA: Diagnosis not present

## 2017-11-21 MED ORDER — SODIUM CHLORIDE 0.9 % IV SOLN
Freq: Once | INTRAVENOUS | Status: AC
  Administered 2017-11-21: 11:00:00 via INTRAVENOUS

## 2017-11-21 MED ORDER — SODIUM CHLORIDE 0.9% FLUSH
10.0000 mL | INTRAVENOUS | Status: AC | PRN
  Administered 2017-11-21: 10 mL

## 2017-11-21 MED ORDER — HEPARIN SOD (PORK) LOCK FLUSH 100 UNIT/ML IV SOLN
500.0000 [IU] | INTRAVENOUS | Status: AC | PRN
  Administered 2017-11-21: 500 [IU]
  Filled 2017-11-21: qty 5

## 2017-11-21 NOTE — Progress Notes (Signed)
PATIENT CARE CENTER NOTE   Diagnosis: Dehydration   Provider: Dr. Irene Limbo   Procedure: 1 liter Normal Saline bolus    Note: Patient received 1 Liter bolus of 0.9% Sodium Chloride over 2 hours. Patient tolerated bolus well with no adverse reaction. Vital signs remained stable. Discharge instructions given. Patient alert, oriented and ambulatory at discharge.

## 2017-11-21 NOTE — Discharge Instructions (Signed)
Dehydration, Adult Dehydration is when there is not enough fluid or water in your body. This happens when you lose more fluids than you take in. Dehydration can range from mild to very bad. It should be treated right away to keep it from getting very bad. Symptoms of mild dehydration may include:  Thirst.  Dry lips.  Slightly dry mouth.  Dry, warm skin.  Dizziness. Symptoms of moderate dehydration may include:  Very dry mouth.  Muscle cramps.  Dark pee (urine). Pee may be the color of tea.  Your body making less pee.  Your eyes making fewer tears.  Heartbeat that is uneven or faster than normal (palpitations).  Headache.  Light-headedness, especially when you stand up from sitting.  Fainting (syncope). Symptoms of very bad dehydration may include:  Changes in skin, such as: ? Cold and clammy skin. ? Blotchy (mottled) or pale skin. ? Skin that does not quickly return to normal after being lightly pinched and let go (poor skin turgor).  Changes in body fluids, such as: ? Feeling very thirsty. ? Your eyes making fewer tears. ? Not sweating when body temperature is high, such as in hot weather. ? Your body making very little pee.  Changes in vital signs, such as: ? Weak pulse. ? Pulse that is more than 100 beats a minute when you are sitting still. ? Fast breathing. ? Low blood pressure.  Other changes, such as: ? Sunken eyes. ? Cold hands and feet. ? Confusion. ? Lack of energy (lethargy). ? Trouble waking up from sleep. ? Short-term weight loss. ? Unconsciousness. Follow these instructions at home:  If told by your doctor, drink an ORS: ? Make an ORS by using instructions on the package. ? Start by drinking small amounts, about  cup (120 mL) every 5-10 minutes. ? Slowly drink more until you have had the amount that your doctor said to have.  Drink enough clear fluid to keep your pee clear or pale yellow. If you were told to drink an ORS, finish the ORS  first, then start slowly drinking clear fluids. Drink fluids such as: ? Water. Do not drink only water by itself. Doing that can make the salt (sodium) level in your body get too low (hyponatremia). ? Ice chips. ? Fruit juice that you have added water to (diluted). ? Low-calorie sports drinks.  Avoid: ? Alcohol. ? Drinks that have a lot of sugar. These include high-calorie sports drinks, fruit juice that does not have water added, and soda. ? Caffeine. ? Foods that are greasy or have a lot of fat or sugar.  Take over-the-counter and prescription medicines only as told by your doctor.  Do not take salt tablets. Doing that can make the salt level in your body get too high (hypernatremia).  Eat foods that have minerals (electrolytes). Examples include bananas, oranges, potatoes, tomatoes, and spinach.  Keep all follow-up visits as told by your doctor. This is important. Contact a doctor if:  You have belly (abdominal) pain that: ? Gets worse. ? Stays in one area (localizes).  You have a rash.  You have a stiff neck.  You get angry or annoyed more easily than normal (irritability).  You are more sleepy than normal.  You have a harder time waking up than normal.  You feel: ? Weak. ? Dizzy. ? Very thirsty.  You have peed (urinated) only a small amount of very dark pee during 6-8 hours. Get help right away if:  You have symptoms of   very bad dehydration.  You cannot drink fluids without throwing up (vomiting).  Your symptoms get worse with treatment.  You have a fever.  You have a very bad headache.  You are throwing up or having watery poop (diarrhea) and it: ? Gets worse. ? Does not go away.  You have blood or something green (bile) in your throw-up.  You have blood in your poop (stool). This may cause poop to look black and tarry.  You have not peed in 6-8 hours.  You pass out (faint).  Your heart rate when you are sitting still is more than 100 beats a  minute.  You have trouble breathing. This information is not intended to replace advice given to you by your health care provider. Make sure you discuss any questions you have with your health care provider. Document Released: 12/08/2008 Document Revised: 09/01/2015 Document Reviewed: 04/07/2015 Elsevier Interactive Patient Education  2018 [employer]  

## 2017-11-24 ENCOUNTER — Encounter: Payer: Self-pay | Admitting: Hematology

## 2017-11-24 DIAGNOSIS — Z7189 Other specified counseling: Secondary | ICD-10-CM | POA: Insufficient documentation

## 2017-11-25 NOTE — Progress Notes (Signed)
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 male who has been referred to [place] by urologist Dr Alexis Frock for evaluation and management of newly diagnosed diffuse large B Cell Lymphoma. He is accompanied today by his wife. The pt reports that he is doing well overall.   The pt reports that in 1993 he had colon cancer with two tumors in the ascending and descending colon, which was picked up from a routine physical. He notes that he was treated with chemotherapy with Levamisole and Leukovorin for 6 months. He has not had any issues with the colon cancer since. He continue to see Dr. Cristina Gong for his GI.   He notes that he has had blood in the urine for a year and a half, picked up in a physical. This prompted his TURP surgery in July 2018. He notes that the bleeding persisted after the surgery. He notes that a stent was placed which has significantly reduced his hematuria. In February 2019 his left uretral mass was identified and subsequently biopsied confirming adenocarcinoma. The pt notes that Dr. Tresa Moore has up to this point been considering removing one of his kidneys, which would have a bearing on our treatment.   A 04/21/17 CT Abdomen identified a right retroperitoneal mass which was biopsied on 05/08/17 which revealed Large B-Cell Lymphoma.   He notes no ongoing health problems and is without physical limitations. He denies having had any blood transfusions.   On 04/23/17 the pt had a Uretral biopsy revealing Ureter, biopsy, Left mass- SUPERFICIAL FRAGMENTS OF UROTHELIAL CARCINOMA ASSOCIATED WITH GRANULATION TISSUE AND NECROSIS.   CT Abdomen of 04/21/17 revealed large right para-aortic retroperitoneal nodal  conglomeration in the abdomen, just inferior to the right renal artery. Imaging features compatible with metastatic disease in this patient with a history of prostate and left urothelial cancer. 2. Aortic atherosclerosis.   Soft Tissue Needle/core biopsy completed on 05/08/17 with results revealing Large B-Cell Lymphoma.   Most recent lab results (05/08/17) of CBC  is as follows: all values are WNL except for WBC at 11.3k, Monocytes Abs at 1.1k, CO2 at 19, Glucose at 109, Creatinine at 1.54, ALT at 16.  On review of systems, pt reports infrequent hematuria, mild back pain associated with golfing, and denies leg swelling, flank pain, fevers, chills, night sweats and any other symptoms.   On PMHx the pt reports Colon cancer in 1993, hypercholesterolemia, glaucoma, low risk prostate cancer, mastoiditis, hernia repair, carpel tunnel. He denies heart or lung problems.  On Social Hx the pt denies smoking and ETOH consumption. On Family Hx the pt reports that his mother had breast cancer when she was roughly 56, and also cervical cancer. On his mother's side, his aunts and uncles had several cancers including lung. Maternal grandfather had prostate cancer.   Interval History:  Jonathan Marshall returns today for management and evaluation of his Diffuse Large B-Cell Lymphoma and Urothelial carcinoma. [name] was on 11/06/17. He is accompanied today by his wife. The pt reports that he is doing well overall.   The pt reports that he has been hydrating with water, iced tea, lemonade and ginger ale, with at least 64 oz a day. He  notes that his energy levels have improved and he has been intentionally eating more. He weight has remained stable in the interim.   The pt notes that he returned to care with his Urologist Dr. Tresa Moore on 11/17/17 for an isolated episode of returned urinary bleeding, and was evaluated with a uroscopy. The pt notes that he is awaiting a TURB tumor on 12/12/17 with Dr.  Tresa Moore.   Lab results today (11/26/17) of CBC w/diff, CMP, and Reticulocytes is as follows: all values are WNL except for RBC at 3.72, HGB at 10.6, HCT at 33.9, MCHC at 31.3, RDW at 15.0, CO2 at 21, Glucose at 100, Creatinine at 2.44, Albumin at 3.3, AST at 13, Total Bilirubin at <0.2, GFR at 23.  On review of systems, pt reports eating better, staying hydrated, improved energy levels, isolate episode of blood in the urine, and denies abdominal pains, back pain, leg swelling, and any other symptoms.   MEDICAL HISTORY:  Past Medical History:  Diagnosis Date  . BPH (benign prostatic hyperplasia)   . Cancer [facility]) 1993   Colon surgery and chemo , 2-19 left ureteral camcer last chemo tx 08-27-17  . DDD (degenerative disc disease), lumbar    L4-L5  . Depression   . Early stage glaucoma    both eyes  . Hearing loss   . Hematuria, gross   . History of adenomatous polyp of colon   . History of carpal tunnel syndrome    left  . History of colon cancer    dx 1993  s/p  hemicolectomy (malignant tumor x2)  and chemo therapy completed 1993--- no recurrence per pt  . History of shingles   . HOH (hard of hearing)    right ear  . Hyperlipidemia   . Insomnia     SURGICAL HISTORY: Past Surgical History:  Procedure Laterality Date  . APPENDECTOMY    . CARPAL TUNNEL RELEASE Left 08/13/2013   Procedure: CARPAL TUNNEL RELEASE LEFT WRIST;  Surgeon: Yvette Rack., MD;  Location: [facility];  Service: Orthopedics;  Laterality: Left;  . COLONOSCOPY    . CYSTOSCOPY W/ RETROGRADES Bilateral 04/03/2017   Procedure: CYSTOSCOPY WITH RETROGRADE PYELOGRAM, LEFT URETEROSCOPY, LEFT STENT PLACEMENT;  Surgeon: Franchot Gallo, MD;  Location: [facility];  Service: Urology;  Laterality: Bilateral;  . CYSTOSCOPY W/ URETERAL STENT PLACEMENT Left 04/23/2017   Procedure: CYSTOSCOPY WITH STENT REPLACEMENT;  Surgeon: Alexis Frock, MD;  Location: [facility];  Service:  Urology;  Laterality: Left;  . CYSTOSCOPY WITH FULGERATION Left 04/03/2017   Procedure: LEFT URETERAL BIOPSY;  Surgeon: Franchot Gallo, MD;  Location: [facility];  Service: Urology;  Laterality: Left;  . CYSTOSCOPY/RETROGRADE/URETEROSCOPY Left 04/23/2017   Procedure: CYSTOSCOPY/RETROGRADE/URETEROSCOPY, LEFT  URETEROSCOPY WITY  BIOPSY;  Surgeon: Alexis Frock, MD;  Location: [facility];  Service: Urology;  Laterality: Left;  . HEMICOLECTOMY  1993   malignant tumor x2 , colon cancer and Appendectomy  . INGUINAL HERNIA REPAIR Right 2003  . IR FLUORO GUIDE PORT INSERTION RIGHT  05/30/2017  . IR US GUIDE VASC ACCESS RIGHT  05/30/2017  . MASTOIDECTOMY Right 2015  . ROBOT ASSITED LAPAROSCOPIC NEPHROURETERECTOMY Left 09/24/2017   Procedure: XI ROBOT ASSITED LAPAROSCOPIC NEPHROURETERECTOMY;  Surgeon: Alexis Frock, MD;  Location: [facility];  Service: Urology;  Laterality: Left;  . ROBOTIC ASSISTED LAPAROSCOPIC LYSIS OF ADHESION  09/24/2017   Procedure: XI ROBOTIC ASSISTED LAPAROSCOPIC EXTENSIVE LYSIS OF ADHESION;  Surgeon: Alexis Frock, MD;  Location:  [facility];  Service: Urology;;  . SIGMOIDOSCOPY  last one 06/ 2018  . TRANSURETHRAL RESECTION OF PROSTATE N/A 09/02/2016   Procedure: TRANSURETHRAL RESECTION OF THE PROSTATE (TURP);  Surgeon: Franchot Gallo, MD;  Location: [facility];  Service: Urology;  Laterality: N/A;    SOCIAL HISTORY: Social History   Socioeconomic History  . Marital status: Married    Spouse name: Not on file  . Number of children: Not on file  . Years of education: Not on file  . Highest education level: Not on file  Occupational History  . Not on file  Social Needs  . Financial resource strain: Not on file  . Food insecurity:    Worry: Not on file    Inability: Not on file  . Transportation needs:    Medical: Not on file    Non-medical: Not on file  Tobacco Use  . Smoking status: Never Smoker  . Smokeless tobacco:  Never Used  Substance and Sexual Activity  . Alcohol use: Yes    Alcohol/week: 1.0 standard drinks    Types: 1 Glasses of wine per week    Comment: rare  . Drug use: Never  . Sexual activity: Not Currently  Lifestyle  . Physical activity:    Days per week: Not on file    Minutes per session: Not on file  . Stress: Not on file  Relationships  . Social connections:    Talks on phone: Not on file    Gets together: Not on file    Attends religious service: Not on file    Active member of club or organization: Not on file    Attends meetings of clubs or organizations: Not on file    Relationship status: Not on file  . Intimate partner violence:    Fear of current or ex partner: Not on file    Emotionally abused: Not on file    Physically abused: Not on file    Forced sexual activity: Not on file  Other Topics Concern  . Not on file  Social History Narrative  . Not on file    FAMILY HISTORY: No family history on file.  ALLERGIES:  has No Known Allergies.  MEDICATIONS:  Current Outpatient Medications  Medication Sig Dispense Refill  . cephALEXin (KEFLEX) 500 MG capsule Take 500 mg by mouth 2 (two) times daily.    [name] Kitchen dronabinol (MARINOL) 5 MG capsule Take 1 capsule (5 mg total) by mouth 2 (two) times daily before a meal. 60 capsule 0  . HYDROcodone-acetaminophen (NORCO/VICODIN) 5-325 MG tablet Take 1 tablet by mouth every 4 (four) hours as needed for moderate pain. 20 tablet 0  . latanoprost (XALATAN) 0.005 % ophthalmic solution Place 1 drop into both eyes at bedtime.    [name] Kitchen loperamide (IMODIUM) 2 MG capsule Take 1 capsule (2 mg total) by mouth every 6 (six) hours as needed for diarrhea or loose stools. 30 capsule 0  . LORazepam (ATIVAN) 0.5 MG tablet Take 1 tablet (0.5 mg total) by mouth every 6 (six) hours as needed (Nausea or vomiting). 30 tablet 0  . lovastatin (MEVACOR) 40 MG tablet Take 40 mg by mouth every evening.    . midodrine (PROAMATINE) 2.5 MG tablet Take 1 tablet  (2.5 mg total) by mouth 2 (two) times daily with a meal. 60 tablet 0  . mirtazapine (REMERON SOL-TAB) 15 MG disintegrating tablet Take 0.5 tablets (7.5 mg total) by mouth at bedtime. 30 tablet 0  . ondansetron (ZOFRAN)  8 MG tablet Take 1 tablet (8 mg total) by mouth 2 (two) times daily as needed for refractory nausea / vomiting. Start on day 3 after cytoxan chemorx 30 tablet 1  . saccharomyces boulardii (FLORASTOR) 250 MG capsule Take 1 capsule (250 mg total) by mouth 2 (two) times daily. 60 capsule 0  . sertraline (ZOLOFT) 25 MG tablet Take 25 mg by mouth every morning.      No current facility-administered medications for this visit.     REVIEW OF SYSTEMS:    A 10+ POINT REVIEW OF SYSTEMS WAS OBTAINED including neurology, dermatology, psychiatry, cardiac, respiratory, lymph, extremities, GI, GU, Musculoskeletal, constitutional, breasts, reproductive, HEENT.  All pertinent positives are noted in the HPI.  All others are negative.   PHYSICAL EXAMINATION: ECOG PERFORMANCE STATUS: 1 - Symptomatic but completely ambulatory  Vitals:   11/26/17 1207  BP: 121/79  Pulse: 88  Resp: 18  Temp: 99 F (37.2 C)  SpO2: 100%   Filed Weights   11/26/17 1207  Weight: 174 lb 1.6 oz (79 kg)   .Body mass index is 25.71 kg/m.  GENERAL:alert, in no acute distress and comfortable SKIN: no acute rashes, no significant lesions EYES: conjunctiva are pink and non-injected, sclera anicteric OROPHARYNX: MMM, no exudates, no oropharyngeal erythema or ulceration NECK: supple, no JVD LYMPH:  no palpable lymphadenopathy in the cervical, axillary or inguinal regions LUNGS: clear to auscultation b/l with normal respiratory effort HEART: regular rate & rhythm ABDOMEN:  normoactive bowel sounds , non tender, not distended. No palpable hepatosplenomegaly.  Extremity: no pedal edema PSYCH: alert & oriented x 3 with fluent speech NEURO: no focal motor/sensory deficits   LABORATORY DATA:  I have reviewed the  data as listed  . CBC Latest Ref Rng & Units 11/26/2017 11/06/2017 10/30/2017  WBC 4.0 - 10.3 K/uL 9.8 6.5 4.9  Hemoglobin 13.0 - 17.1 g/dL 10.6(L) 10.1(L) 11.4(L)  Hematocrit 38.4 - 49.9 % 33.9(L) 32.4(L) 34.1(L)  Platelets 140 - 400 K/uL 280 214 266    . CMP Latest Ref Rng & Units 11/26/2017 11/06/2017 10/30/2017  Glucose 70 - 99 mg/dL 100(H) 105(H) 107(H)  BUN 8 - 23 mg/dL 18 26(H) 64(H)  Creatinine 0.61 - 1.24 mg/dL 2.44(H) 2.63(H) 5.52(HH)  Sodium 135 - 145 mmol/L 140 143 135  Potassium 3.5 - 5.1 mmol/L 4.5 5.1 4.5  Chloride 98 - 111 mmol/L 110 115(H) 107  CO2 22 - 32 mmol/L 21(L) 20(L) 16(L)  Calcium 8.9 - 10.3 mg/dL 9.4 9.3 10.3  Total Protein 6.5 - 8.1 g/dL 6.6 6.0(L) 7.3  Total Bilirubin 0.3 - 1.2 mg/dL <0.2(L) <0.2(L) 0.3  Alkaline Phos 38 - 126 U/L 58 54 82  AST 15 - 41 U/L 13(L) 10(L) 10(L)  ALT 0 - 44 U/L 8 <6 9    Component     Latest Ref Rng & Units 05/15/2017  Retic Ct Pct     0.8 - 1.8 % 1.6  RBC.     4.20 - 5.82 MIL/uL 4.62  Retic Count, Absolute     34.8 - 93.9 K/uL 73.9  Prothrombin Time     11.4 - 15.2 seconds 14.5  INR      1.13  APTT     24 - 36 seconds 31  HIV Screen 4th Generation wRfx     Non Reactive Non Reactive  HCV Ab     0.0 - 0.9 s/co ratio <0.1  Hep B Core Ab, Tot     Negative Negative  Hepatitis B  Surface Ag     Negative Negative  LDH     125 - 245 U/L 132   . Lab Results  Component Value Date   LDH 111 11/06/2017     05/08/17 Soft Tissue Needle Core Biopsy:   04/23/17 Ureter Biopsy:   RADIOGRAPHIC STUDIES: I have personally reviewed the radiological images as listed and agreed with the findings in the report. No results found. 04/21/17 CT Abdomen   ASSESSMENT & PLAN:   84 y.o. male with  1. Recently diagnosed Diffuse Large B Cell Lymphoma - Stage II -bulky  05/26/17 PET/CT which showed 1. 5 cm right-sided retroperitoneal nodal mass is hypermetabolic and consistent with known lymphoma. Small lymph node just below the  aortic bifurcation is also hypermetabolic. -ECHO shows normal EF.  S/p 4 cycles of R-mini CHOP  07/28/17 PET/CT which revealed Significant response to interval therapy. The large retroperitoneal mass is significantly smaller with minimal residual activity, slightly greater than blood pool (Deauville 3). No evidence of disease progression.  -Recommend Ensure/boost for nutrition supplement   2. 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  -s/p left nephroureterectomy pm 09/24/2017 PLAN: -continue urologic f/u with Dr Tresa Moore.  3. S/p post operative Acute UTI with E.coli with sepsis and dehydration 4.s/p  AKI on CKD- single kidney. - likely due to poor po intake- creatinine improved with IVF 5. Protein calorie malnutrition-moderate 6. Diarrhea - neg GI panel -- resolved  PLAN -Increased marinol to 5mg  BID to boost appetite -Avoid NSAIDs, infections and dehydration to protect kidneys -Tylenol if necessary -Discussed that as the pt began IVF and gained 12 pounds in one week, he should monitor his weight for dehydration if his weight drops beneath his current 175 pounds -Track BP twice a day, morning and evening and follow up with cardiology for BP medication management -Discussed pt labwork today, 11/26/17; blood counts are stable, Creatinine has improved to 2.44, chemistries are otherwise stable  -No indication for IVF at this time -Recommended that the pt continue to eat well, drink at least 48-64 oz of water each day, and walk 20-30 minutes each day.  -Per the pt, he is awaiting TURB tumor on 12/12/17 with Dr. Tresa Moore in urology. -Discussed the option to pursue RT with Rad Onc for the pt's DLBCL which was treated with 4 cycles of R-mini-CHOP before his surgery for his urothelial carcinoma. Pt would like to wait for referral until after repeat PET/CT scan.  -Will repeat PET/CT in 4 weeks -Pt received flu shot in clinic  today  -Will see the pt back in 5 weeks     Flu shot today PET/CT in 4 weeks RTC with Dr Irene Limbo in 5 weeks    All of the patients questions were answered with apparent satisfaction. The patient knows to call the clinic with any problems, questions or concerns.  The total time spent in the appt was 25 minutes and more than 50% was on counseling and direct patient cares.   Sullivan Lone MD MS AAHIVMS [facility] Hematology/Oncology Physician [facility]  (Office):       [phone] (Work cell):  [phone] (Fax):           [phone]  11/26/2017 12:52 PM  I, Baldwin Jamaica, am acting as a scribe for Dr. Irene Limbo  .I have reviewed the above documentation for accuracy and completeness, and I agree with the above.  Brunetta Genera MD

## 2017-11-26 ENCOUNTER — Other Ambulatory Visit: Payer: Self-pay

## 2017-11-26 ENCOUNTER — Inpatient Hospital Stay: Payer: Medicare Other | Attending: Hematology | Admitting: Hematology

## 2017-11-26 ENCOUNTER — Inpatient Hospital Stay: Payer: Medicare Other

## 2017-11-26 ENCOUNTER — Telehealth: Payer: Self-pay

## 2017-11-26 VITALS — BP 121/79 | HR 88 | Temp 99.0°F | Resp 18 | Ht 69.0 in | Wt 174.1 lb

## 2017-11-26 DIAGNOSIS — C8333 Diffuse large B-cell lymphoma, intra-abdominal lymph nodes: Secondary | ICD-10-CM | POA: Diagnosis not present

## 2017-11-26 DIAGNOSIS — E440 Moderate protein-calorie malnutrition: Secondary | ICD-10-CM

## 2017-11-26 DIAGNOSIS — C662 Malignant neoplasm of left ureter: Secondary | ICD-10-CM | POA: Diagnosis not present

## 2017-11-26 DIAGNOSIS — C8330 Diffuse large B-cell lymphoma, unspecified site: Secondary | ICD-10-CM

## 2017-11-26 DIAGNOSIS — Z85038 Personal history of other malignant neoplasm of large intestine: Secondary | ICD-10-CM

## 2017-11-26 DIAGNOSIS — N179 Acute kidney failure, unspecified: Secondary | ICD-10-CM

## 2017-11-26 DIAGNOSIS — Z23 Encounter for immunization: Secondary | ICD-10-CM | POA: Diagnosis present

## 2017-11-26 DIAGNOSIS — N189 Chronic kidney disease, unspecified: Secondary | ICD-10-CM | POA: Diagnosis not present

## 2017-11-26 DIAGNOSIS — C689 Malignant neoplasm of urinary organ, unspecified: Secondary | ICD-10-CM

## 2017-11-26 LAB — CMP (CANCER CENTER ONLY)
ALT: 8 U/L (ref 0–44)
AST: 13 U/L — AB (ref 15–41)
Albumin: 3.3 g/dL — ABNORMAL LOW (ref 3.5–5.0)
Alkaline Phosphatase: 58 U/L (ref 38–126)
Anion gap: 9 (ref 5–15)
BUN: 18 mg/dL (ref 8–23)
CALCIUM: 9.4 mg/dL (ref 8.9–10.3)
CHLORIDE: 110 mmol/L (ref 98–111)
CO2: 21 mmol/L — ABNORMAL LOW (ref 22–32)
CREATININE: 2.44 mg/dL — AB (ref 0.61–1.24)
GFR, EST AFRICAN AMERICAN: 27 mL/min — AB (ref >60)
GFR, EST NON AFRICAN AMERICAN: 23 mL/min — AB (ref >60)
Glucose, Bld: 100 mg/dL — ABNORMAL HIGH (ref 70–99)
Potassium: 4.5 mmol/L (ref 3.5–5.1)
Sodium: 140 mmol/L (ref 135–145)
TOTAL PROTEIN: 6.6 g/dL (ref 6.5–8.1)

## 2017-11-26 LAB — RETICULOCYTES
RBC.: 3.72 MIL/uL — AB (ref 4.20–5.82)
RETIC CT PCT: 1.2 % (ref 0.8–1.8)
Retic Count, Absolute: 44.6 10*3/uL (ref 34.8–93.9)

## 2017-11-26 LAB — CBC WITH DIFFERENTIAL (CANCER CENTER ONLY)
BASOS ABS: 0 10*3/uL (ref 0.0–0.1)
BASOS PCT: 0 %
EOS ABS: 0.3 10*3/uL (ref 0.0–0.5)
Eosinophils Relative: 3 %
HCT: 33.9 % — ABNORMAL LOW (ref 38.4–49.9)
HEMOGLOBIN: 10.6 g/dL — AB (ref 13.0–17.1)
LYMPHS ABS: 2.3 10*3/uL (ref 0.9–3.3)
Lymphocytes Relative: 24 %
MCH: 28.5 pg (ref 27.2–33.4)
MCHC: 31.3 g/dL — ABNORMAL LOW (ref 32.0–36.0)
MCV: 91.1 fL (ref 79.3–98.0)
Monocytes Absolute: 0.8 10*3/uL (ref 0.1–0.9)
Monocytes Relative: 9 %
Neutro Abs: 6.3 10*3/uL (ref 1.5–6.5)
Neutrophils Relative %: 64 %
Platelet Count: 280 10*3/uL (ref 140–400)
RBC: 3.72 MIL/uL — AB (ref 4.20–5.82)
RDW: 15 % — ABNORMAL HIGH (ref 11.0–14.6)
WBC: 9.8 10*3/uL (ref 4.0–10.3)

## 2017-11-26 MED ORDER — INFLUENZA VAC SPLIT HIGH-DOSE 0.5 ML IM SUSY
0.5000 mL | PREFILLED_SYRINGE | Freq: Once | INTRAMUSCULAR | Status: DC
  Filled 2017-11-26: qty 0.5

## 2017-11-26 MED ORDER — INFLUENZA VAC SPLIT HIGH-DOSE 0.5 ML IM SUSY
0.5000 mL | PREFILLED_SYRINGE | Freq: Once | INTRAMUSCULAR | Status: AC
  Administered 2017-11-26: 0.5 mL via INTRAMUSCULAR
  Filled 2017-11-26: qty 0.5

## 2017-11-26 NOTE — Telephone Encounter (Signed)
Printed avs and calender of upcoming appointment. Per 10/2 los 

## 2017-12-08 ENCOUNTER — Other Ambulatory Visit: Payer: Self-pay

## 2017-12-08 ENCOUNTER — Encounter (HOSPITAL_BASED_OUTPATIENT_CLINIC_OR_DEPARTMENT_OTHER): Payer: Self-pay | Admitting: *Deleted

## 2017-12-12 ENCOUNTER — Encounter (HOSPITAL_BASED_OUTPATIENT_CLINIC_OR_DEPARTMENT_OTHER): Payer: Self-pay | Admitting: *Deleted

## 2017-12-12 ENCOUNTER — Other Ambulatory Visit: Payer: Self-pay

## 2017-12-12 ENCOUNTER — Encounter (HOSPITAL_BASED_OUTPATIENT_CLINIC_OR_DEPARTMENT_OTHER)
Admission: RE | Disposition: A | Discharge status: Home / Self Care | Payer: Self-pay | Source: Ambulatory Visit | Attending: Urology

## 2017-12-12 ENCOUNTER — Ambulatory Visit (HOSPITAL_BASED_OUTPATIENT_CLINIC_OR_DEPARTMENT_OTHER)
Admission: RE | Admit: 2017-12-12 | Discharge: 2017-12-12 | Disposition: A | Discharge status: Home / Self Care | Payer: Medicare Other | Source: Ambulatory Visit | Attending: Urology | Admitting: Urology

## 2017-12-12 ENCOUNTER — Ambulatory Visit (HOSPITAL_BASED_OUTPATIENT_CLINIC_OR_DEPARTMENT_OTHER): Payer: Medicare Other | Admitting: Anesthesiology

## 2017-12-12 DIAGNOSIS — E785 Hyperlipidemia, unspecified: Secondary | ICD-10-CM | POA: Insufficient documentation

## 2017-12-12 DIAGNOSIS — Z905 Acquired absence of kidney: Secondary | ICD-10-CM | POA: Diagnosis not present

## 2017-12-12 DIAGNOSIS — C8590 Non-Hodgkin lymphoma, unspecified, unspecified site: Secondary | ICD-10-CM | POA: Diagnosis not present

## 2017-12-12 DIAGNOSIS — N401 Enlarged prostate with lower urinary tract symptoms: Secondary | ICD-10-CM | POA: Diagnosis not present

## 2017-12-12 DIAGNOSIS — M5136 Other intervertebral disc degeneration, lumbar region: Secondary | ICD-10-CM | POA: Diagnosis not present

## 2017-12-12 DIAGNOSIS — Z79899 Other long term (current) drug therapy: Secondary | ICD-10-CM | POA: Insufficient documentation

## 2017-12-12 DIAGNOSIS — C679 Malignant neoplasm of bladder, unspecified: Secondary | ICD-10-CM | POA: Insufficient documentation

## 2017-12-12 DIAGNOSIS — Z9221 Personal history of antineoplastic chemotherapy: Secondary | ICD-10-CM | POA: Insufficient documentation

## 2017-12-12 DIAGNOSIS — Z8553 Personal history of malignant neoplasm of renal pelvis: Secondary | ICD-10-CM | POA: Diagnosis not present

## 2017-12-12 DIAGNOSIS — Z8554 Personal history of malignant neoplasm of ureter: Secondary | ICD-10-CM | POA: Diagnosis not present

## 2017-12-12 DIAGNOSIS — F329 Major depressive disorder, single episode, unspecified: Secondary | ICD-10-CM | POA: Insufficient documentation

## 2017-12-12 DIAGNOSIS — Z906 Acquired absence of other parts of urinary tract: Secondary | ICD-10-CM | POA: Insufficient documentation

## 2017-12-12 DIAGNOSIS — Z9049 Acquired absence of other specified parts of digestive tract: Secondary | ICD-10-CM | POA: Diagnosis not present

## 2017-12-12 DIAGNOSIS — Z85038 Personal history of other malignant neoplasm of large intestine: Secondary | ICD-10-CM | POA: Diagnosis not present

## 2017-12-12 DIAGNOSIS — G4700 Insomnia, unspecified: Secondary | ICD-10-CM | POA: Insufficient documentation

## 2017-12-12 HISTORY — PX: CYSTOSCOPY W/ RETROGRADES: SHX1426

## 2017-12-12 HISTORY — PX: TRANSURETHRAL RESECTION OF BLADDER TUMOR: SHX2575

## 2017-12-12 SURGERY — TURBT (TRANSURETHRAL RESECTION OF BLADDER TUMOR)
Anesthesia: General

## 2017-12-12 MED ORDER — EPHEDRINE SULFATE-NACL 50-0.9 MG/10ML-% IV SOSY
PREFILLED_SYRINGE | INTRAVENOUS | Status: DC | PRN
  Administered 2017-12-12 (×2): 10 mg via INTRAVENOUS

## 2017-12-12 MED ORDER — CIPROFLOXACIN IN D5W 200 MG/100ML IV SOLN
INTRAVENOUS | Status: AC
  Filled 2017-12-12: qty 100

## 2017-12-12 MED ORDER — ONDANSETRON HCL 4 MG/2ML IJ SOLN
INTRAMUSCULAR | Status: AC
  Filled 2017-12-12: qty 2

## 2017-12-12 MED ORDER — TRAMADOL HCL 50 MG PO TABS: 50.0000 mg | ORAL_TABLET | Freq: Four times a day (QID) | ORAL | 0 refills | Status: DC | PRN

## 2017-12-12 MED ORDER — IOHEXOL 300 MG/ML  SOLN
INTRAMUSCULAR | Status: DC | PRN
  Administered 2017-12-12: 5 mL via URETHRAL

## 2017-12-12 MED ORDER — ACETAMINOPHEN 10 MG/ML IV SOLN
1000.0000 mg | Freq: Once | INTRAVENOUS | Status: DC | PRN
  Filled 2017-12-12: qty 100

## 2017-12-12 MED ORDER — LIDOCAINE 2% (20 MG/ML) 5 ML SYRINGE
INTRAMUSCULAR | Status: AC
  Filled 2017-12-12: qty 5

## 2017-12-12 MED ORDER — OXYCODONE HCL 5 MG PO TABS
5.0000 mg | ORAL_TABLET | Freq: Once | ORAL | Status: DC | PRN
  Filled 2017-12-12: qty 1

## 2017-12-12 MED ORDER — ONDANSETRON HCL 4 MG/2ML IJ SOLN
INTRAMUSCULAR | Status: DC | PRN
  Administered 2017-12-12: 4 mg via INTRAVENOUS

## 2017-12-12 MED ORDER — DEXAMETHASONE SODIUM PHOSPHATE 4 MG/ML IJ SOLN
INTRAMUSCULAR | Status: DC | PRN
  Administered 2017-12-12: 10 mg via INTRAVENOUS

## 2017-12-12 MED ORDER — PROPOFOL 10 MG/ML IV BOLUS
INTRAVENOUS | Status: AC
  Filled 2017-12-12: qty 20

## 2017-12-12 MED ORDER — MIDAZOLAM HCL 2 MG/2ML IJ SOLN
INTRAMUSCULAR | Status: AC
  Filled 2017-12-12: qty 2

## 2017-12-12 MED ORDER — OXYCODONE HCL 5 MG/5ML PO SOLN
5.0000 mg | Freq: Once | ORAL | Status: DC | PRN
  Filled 2017-12-12: qty 5

## 2017-12-12 MED ORDER — MEPERIDINE HCL 25 MG/ML IJ SOLN
6.2500 mg | INTRAMUSCULAR | Status: DC | PRN
  Filled 2017-12-12: qty 1

## 2017-12-12 MED ORDER — EPHEDRINE 5 MG/ML INJ
INTRAVENOUS | Status: AC
  Filled 2017-12-12: qty 10

## 2017-12-12 MED ORDER — FENTANYL CITRATE (PF) 100 MCG/2ML IJ SOLN
INTRAMUSCULAR | Status: AC
  Filled 2017-12-12: qty 2

## 2017-12-12 MED ORDER — PROPOFOL 10 MG/ML IV BOLUS
INTRAVENOUS | Status: DC | PRN
  Administered 2017-12-12: 120 mg via INTRAVENOUS

## 2017-12-12 MED ORDER — LIDOCAINE HCL (CARDIAC) PF 100 MG/5ML IV SOSY
PREFILLED_SYRINGE | INTRAVENOUS | Status: DC | PRN
  Administered 2017-12-12: 80 mg via INTRAVENOUS

## 2017-12-12 MED ORDER — FENTANYL CITRATE (PF) 100 MCG/2ML IJ SOLN
25.0000 ug | INTRAMUSCULAR | Status: DC | PRN
  Filled 2017-12-12: qty 1

## 2017-12-12 MED ORDER — DEXAMETHASONE SODIUM PHOSPHATE 10 MG/ML IJ SOLN
INTRAMUSCULAR | Status: AC
  Filled 2017-12-12: qty 1

## 2017-12-12 MED ORDER — ACETAMINOPHEN 325 MG PO TABS
325.0000 mg | ORAL_TABLET | ORAL | Status: DC | PRN
  Filled 2017-12-12: qty 2

## 2017-12-12 MED ORDER — FENTANYL CITRATE (PF) 100 MCG/2ML IJ SOLN
INTRAMUSCULAR | Status: DC | PRN
  Administered 2017-12-12 (×3): 25 ug via INTRAVENOUS

## 2017-12-12 MED ORDER — CIPROFLOXACIN IN D5W 200 MG/100ML IV SOLN
200.0000 mg | INTRAVENOUS | Status: AC
  Administered 2017-12-12: 200 mg via INTRAVENOUS
  Filled 2017-12-12: qty 100

## 2017-12-12 MED ORDER — LACTATED RINGERS IV SOLN
INTRAVENOUS | Status: DC
  Administered 2017-12-12: 08:00:00 via INTRAVENOUS
  Filled 2017-12-12: qty 1000

## 2017-12-12 MED ORDER — ARTIFICIAL TEARS OPHTHALMIC OINT
TOPICAL_OINTMENT | OPHTHALMIC | Status: AC
  Filled 2017-12-12: qty 3.5

## 2017-12-12 MED ORDER — SODIUM CHLORIDE 0.9 % IR SOLN
Status: DC | PRN
  Administered 2017-12-12 (×3): 3000 mL via INTRAVESICAL

## 2017-12-12 MED ORDER — ONDANSETRON HCL 4 MG/2ML IJ SOLN
4.0000 mg | Freq: Once | INTRAMUSCULAR | Status: DC | PRN
  Filled 2017-12-12: qty 2

## 2017-12-12 MED ORDER — ACETAMINOPHEN 160 MG/5ML PO SOLN
325.0000 mg | ORAL | Status: DC | PRN
  Filled 2017-12-12: qty 20.3

## 2017-12-12 SURGICAL SUPPLY — 29 items
BAG DRAIN URO-CYSTO SKYTR STRL (DRAIN) ×4 IMPLANT
BAG URINE DRAINAGE (UROLOGICAL SUPPLIES) IMPLANT
BAG URINE LEG 19OZ MD ST LTX (BAG) IMPLANT
BAG URINE LEG 500ML (DRAIN) IMPLANT
CATH FOLEY 2WAY SLVR  5CC 22FR (CATHETERS)
CATH FOLEY 2WAY SLVR 30CC 20FR (CATHETERS) IMPLANT
CATH FOLEY 2WAY SLVR 5CC 22FR (CATHETERS) IMPLANT
CATH INTERMIT  6FR 70CM (CATHETERS) ×4 IMPLANT
CLOTH BEACON ORANGE TIMEOUT ST (SAFETY) ×4 IMPLANT
ELECT REM PT RETURN 9FT ADLT (ELECTROSURGICAL) ×4
ELECTRODE REM PT RTRN 9FT ADLT (ELECTROSURGICAL) ×2 IMPLANT
EVACUATOR MICROVAS BLADDER (UROLOGICAL SUPPLIES) IMPLANT
GLOVE BIO SURGEON STRL SZ7.5 (GLOVE) ×4 IMPLANT
GOWN STRL REUS W/ TWL LRG LVL3 (GOWN DISPOSABLE) ×2 IMPLANT
GOWN STRL REUS W/TWL LRG LVL3 (GOWN DISPOSABLE) ×6 IMPLANT
GUIDEWIRE ANG ZIPWIRE 038X150 (WIRE) IMPLANT
GUIDEWIRE STR DUAL SENSOR (WIRE) IMPLANT
IV NS 1000ML (IV SOLUTION) ×2
IV NS 1000ML BAXH (IV SOLUTION) ×2 IMPLANT
IV NS IRRIG 3000ML ARTHROMATIC (IV SOLUTION) ×4 IMPLANT
KIT TURNOVER CYSTO (KITS) ×4 IMPLANT
LOOP CUT BIPOLAR 24F LRG (ELECTROSURGICAL) IMPLANT
MANIFOLD NEPTUNE II (INSTRUMENTS) ×4 IMPLANT
NS IRRIG 500ML POUR BTL (IV SOLUTION) ×4 IMPLANT
PACK CYSTO (CUSTOM PROCEDURE TRAY) ×4 IMPLANT
SYR 10ML LL (SYRINGE) IMPLANT
SYRINGE IRR TOOMEY STRL 70CC (SYRINGE) IMPLANT
TUBE CONNECTING 12'X1/4 (SUCTIONS)
TUBE CONNECTING 12X1/4 (SUCTIONS) IMPLANT

## 2017-12-12 NOTE — Op Note (Signed)
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  PROCEDURE: 1.  Cystoscopy with right pyelogram, interpretation. 2.  Transuretrhal REsection of bladder tumor, large  ESTIMATED BLOOD LOSS:  Nil.  COMPLICATIONS:  None.  SPECIMENS:   1.  Bladder tumor. 2.  Base of bladder tumor.  FINDINGS: 1.  Multifocal papillary bladder tumor in all quadrants of the bladder.  Estimated surface area approximately 7 cm sq. 2.  Unremarkable right retropyelogram. 3.  Absence of left ureter as expected.  INDICATIONS:  The patient is a very pleasant 84 year old gentleman with recent history of left upper tract urothelial carcinoma.  He is status post nephroureterectomy for that.  He was found on workup of a persistent new hematuria postoperatively to have  unfortunately a multifocal bladder cancer, clinically localized.  Options were discussed including recommended path of transection of bladder tumor right retrograde for diagnostic staging and treatment purposes and he wished to proceed.  Informed  consent was then placed in medical record.  DESCRIPTION OF PROCEDURE:  The patient was identified.  The procedure being transection of bladder tumor, retrograde was confirmed.  Procedure timeout performed.  Intravenous antibiotics administered.  General anesthesia introduced.  The patient was  placed into a low lithotomy position, sterile field was created by prepping and draping his penis, perineum and proximal thighs using iodine.  Cystourethroscopy was performed using a 22-French rigid cystoscope with offset lens.  Inspection of anterior  and posterior urethra revealed expected TURP defect of the prostate wide open urinary channel.   Inspection of the bladder revealed a prior resection of left ureteral orifice as anticipated.  The right ureteral orifice was unremarkable.  There is multifocal  bladder tumor.  Total surface area approximately 7 cm squared, but no individual tumor was very large.  Photodocumentation was performed.  Attention was directed at retrograde pyelogram on the right.  The right ureteral orifice was cannulated with a 6  [place] renal catheter and right retrograde pyelogram was obtained.  Right retrograde pyelogram demonstrated a single right ureter single system right kidney.  No filling defects or narrowing noted.  The cystoscope was then changed to a 26-French resectoscope sheath with visual obturator and using medium size resectoscope  loop and a combination of colon resection and cut resection, each foci of bladder tumor was carefully resected down to what appeared to be the superficial fibromuscular stroma of the urinary bladder.  Exquisite care was taken to avoid bladder  perforation which did not occur.  Bladder tumor fragments were irrigated and set aside for pathologic analysis labeled as bladder tumor.  The most nodular area of the tumor, which was a midline posterior area was then biopsied with cold cup biopsy  forceps at the deep margin, taking representative muscular bites of the urinary bladder again taking exquisite care to avoid perforation which did not occur.  These were set aside labeled as deep margin of bladder tumor.  The base of these areas were  fulgurated.  The bladder was then carefully inspected once again.  There was complete resolution of all accessible papillary bladder tumor.  The right ureteral orifice was uninjured and visibly patent.  It was not felt that catheterization would be  warranted.  As such, the bladder was partially empty per cystoscope.  Procedure terminated.  The  patient tolerated the procedure well.  No immediate peri-procedural complications.  The patient was taken to  postanesthesia care in stable condition.  TN/NUANCE  D:12/12/2017 T:12/12/2017 JOB:[id number]

## 2017-12-12 NOTE — Anesthesia Procedure Notes (Signed)
Procedure Name: LMA Insertion Date/Time: 12/12/2017 8:43 AM Performed by: Suan Halter, CRNA Pre-anesthesia Checklist: Patient identified, Emergency Drugs available, Suction available and Patient being monitored Patient Re-evaluated:Patient Re-evaluated prior to induction Oxygen Delivery Method: Circle system utilized Preoxygenation: Pre-oxygenation with 100% oxygen Induction Type: IV induction Ventilation: Mask ventilation without difficulty LMA: LMA inserted LMA Size: 4.0 Number of attempts: 1 Airway Equipment and Method: Bite block Placement Confirmation: positive ETCO2 Tube secured with: Tape Dental Injury: Teeth and Oropharynx as per pre-operative assessment

## 2017-12-12 NOTE — Discharge Instructions (Signed)
1 - You may have urinary urgency (bladder spasms) and bloody urine on / off for up to 2 weeks. This is normal.  2 - Call MD or go to ER for fever >102, severe pain / nausea / vomiting not relieved by medications, or acute change in medical status   Post Anesthesia Home Care Instructions  Activity: Get plenty of rest for the remainder of the day. A responsible individual must stay with you for 24 hours following the procedure.  For the next 24 hours, DO NOT: -Drive a car -Paediatric nurse -Drink alcoholic beverages -Take any medication unless instructed by your physician -Make any legal decisions or sign important papers.  Meals: Start with liquid foods such as gelatin or soup. Progress to regular foods as tolerated. Avoid greasy, spicy, heavy foods. If nausea and/or vomiting occur, drink only clear liquids until the nausea and/or vomiting subsides. Call your physician if vomiting continues.  Special Instructions/Symptoms: Your throat may feel dry or sore from the anesthesia or the breathing tube placed in your throat during surgery. If this causes discomfort, gargle with warm salt water. The discomfort should disappear within 24 hours.  If you had a scopolamine patch placed behind your ear for the management of post- operative nausea and/or vomiting:  1. The medication in the patch is effective for 72 hours, after which it should be removed.  Wrap patch in a tissue and discard in the trash. Wash hands thoroughly with soap and water. 2. You may remove the patch earlier than 72 hours if you experience unpleasant side effects which may include dry mouth, dizziness or visual disturbances. 3. Avoid touching the patch. Wash your hands with soap and water after contact with the patch.

## 2017-12-12 NOTE — Brief Op Note (Signed)
12/12/2017  9:21 AM  PATIENT:  Jonathan Marshall  84 y.o. male  PRE-OPERATIVE DIAGNOSIS:  BLADDER CANCER  POST-OPERATIVE DIAGNOSIS:  BLADDER CANCER  PROCEDURE:  Procedure(s): TRANSURETHRAL RESECTION OF BLADDER TUMOR (TURBT) (N/A) CYSTOSCOPY WITH RETROGRADE PYELOGRAM (Bilateral)  SURGEON:  Surgeon(s) and Role:    * Alexis Frock, MD - Primary  PHYSICIAN ASSISTANT:   ASSISTANTS: none   ANESTHESIA:   general  EBL:  0 mL   BLOOD ADMINISTERED:none  DRAINS: none   LOCAL MEDICATIONS USED:  NONE  SPECIMEN:  Source of Specimen:  1 - bladder tumor; 2 - deep margin bladder tumor  DISPOSITION OF SPECIMEN:  PATHOLOGY  COUNTS:  YES  TOURNIQUET:  * No tourniquets in log *  DICTATION: .Other Dictation: Dictation Number [id number]  PLAN OF CARE: Discharge to home after PACU  PATIENT DISPOSITION:  PACU - hemodynamically stable.   Delay start of Pharmacological VTE agent (>24hrs) due to surgical blood loss or risk of bleeding: yes

## 2017-12-12 NOTE — Anesthesia Postprocedure Evaluation (Signed)
Anesthesia Post Note  Patient: Jonathan Marshall  Procedure(s) Performed: TRANSURETHRAL RESECTION OF BLADDER TUMOR (TURBT) (N/A ) CYSTOSCOPY WITH RETROGRADE PYELOGRAM (Bilateral )     Patient location during evaluation: PACU Anesthesia Type: General Level of consciousness: awake Pain management: pain level controlled Vital Signs Assessment: post-procedure vital signs reviewed and stable Respiratory status: spontaneous breathing Cardiovascular status: stable Postop Assessment: no apparent nausea or vomiting Anesthetic complications: no    Last Vitals:  Vitals:   12/12/17 1000 12/12/17 1015  BP: 127/73 124/69  Pulse: 68 73  Resp: 19 19  Temp:    SpO2: 100% 100%    Last Pain:  Vitals:   12/12/17 0945  TempSrc:   PainSc: Asleep   Pain Goal: Patients Stated Pain Goal: 5 (12/12/17 0722)               Vollie Brunty JR,JOHN Mateo Flow

## 2017-12-12 NOTE — Anesthesia Preprocedure Evaluation (Addendum)
Anesthesia Evaluation  Patient identified by MRN, date of birth, ID band Patient awake    Reviewed: Allergy & Precautions, NPO status , Patient's Chart, lab work & pertinent test results  Airway Mallampati: III  TM Distance: >3 FB Neck ROM: Full    Dental no notable dental hx. (+) Teeth Intact, Dental Advisory Given, Caps,    Pulmonary neg pulmonary ROS,    Pulmonary exam normal breath sounds clear to auscultation       Cardiovascular negative cardio ROS Normal cardiovascular exam Rhythm:Regular Rate:Normal  ECHO: Normal LV size with mild LV hypertrophy. EF 55%. Normal RV size and systolic function. No significant valvular abnormalities.   Neuro/Psych PSYCHIATRIC DISORDERS Depression negative neurological ROS     GI/Hepatic negative GI ROS, Neg liver ROS, History of colon cancer s/p chemo   Endo/Other  negative endocrine ROS  Renal/GU Renal disease     Musculoskeletal   Abdominal Normal abdominal exam  (+)   Peds  Hematology  (+) anemia , HLD   Anesthesia Other Findings LEFT URETERAL CANCER  Reproductive/Obstetrics                            Anesthesia Physical  Anesthesia Plan  ASA: II  Anesthesia Plan: General   Post-op Pain Management:    Induction: Intravenous  PONV Risk Score and Plan: 3 and Ondansetron, Dexamethasone and Treatment may vary due to age or medical condition  Airway Management Planned: LMA  Additional Equipment:   Intra-op Plan:   Post-operative Plan: Extubation in OR  Informed Consent: I have reviewed the patients History and Physical, chart, labs and discussed the procedure including the risks, benefits and alternatives for the proposed anesthesia with the patient or authorized representative who has indicated his/her understanding and acceptance.   Dental advisory given  Plan Discussed with: CRNA and Surgeon  Anesthesia Plan Comments:          Anesthesia Quick Evaluation

## 2017-12-12 NOTE — Transfer of Care (Signed)
Immediate Anesthesia Transfer of Care Note  Patient: Jonathan Marshall  Procedure(s) Performed: Procedure(s) (LRB): TRANSURETHRAL RESECTION OF BLADDER TUMOR (TURBT) (N/A) CYSTOSCOPY WITH RETROGRADE PYELOGRAM (Bilateral)  Patient Location: PACU  Anesthesia Type: General  Level of Consciousness: awake, oriented, sedated and patient cooperative  Airway & Oxygen Therapy: Patient Spontanous Breathing and Patient connected to face mask oxygen  Post-op Assessment: Report given to PACU RN and Post -op Vital signs reviewed and stable  Post vital signs: Reviewed and stable  Complications: No apparent anesthesia complications  Last Vitals:  Vitals Value Taken Time  BP 128/66 12/12/2017  9:27 AM  Temp    Pulse 69 12/12/2017  9:28 AM  Resp 16 12/12/2017  9:28 AM  SpO2 100 % 12/12/2017  9:28 AM  Vitals shown include unvalidated device data.  Last Pain:  Vitals:   12/12/17 0722  TempSrc:   PainSc: 0-No pain      Patients Stated Pain Goal: 5 (12/12/17 9470)

## 2017-12-12 NOTE — H&P (Signed)
Jonathan Marshall is an 84 y.o. male.    Chief Complaint: Pre-op Transurethral Resection of Bladder Tumor / Right Retrograde Pyelogram  HPI:   1 - Left Ureteral Cancer / Bladder Cancer - s/p left nephro-ureterectomy 08/2017 for large volume [id number] HIgh Grade Left ureteral cancer with NEGATIVE margins. Pre-op not amenable to endoscopic management. Case required extensive adhesiolysis given h/o colon resection.   Recent Surveillance:  10/2017 Cysto multifocal bladder tumors, estimate 6cm total.   2 - Very Low Risk Prostate Cancer - <5% Gleason 6 disease incidental in TURP chips 08/2016 ==> surveillance.   3 - Prostatic Hypertrophy with Lower Urinary Tract Symptoms - s/p TURP 08/2016 by [facility]. On finasteride post-op to prevent regrowth.   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 Undergoing primary chemo under care pf Carolyne Fiscal MD with [facility]. Restaging PET.CT 07/2017 with god response to chemo.   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 Cr 2.6's post-op.   6 - Recurrent Cystitis - treated for post-op cystitis from enterococcus with cefpodoxime 08/2017, then e. coli 09/2017. On Cipro pre-op prior to surgery today based on most recent UCX.   PMH sig for colon cancer (s/p primary resection / re-anastamosis (never had colosomy) + chemo 1993, follows GI [facility] now), Rt inguinal hernia repair. His PCP is Lorene Dy MD   Today " [facility] " is seen in f/u above and cysto. He had bumpt course past 2 monhts with few admissions for cystitis and ARF, now finally keeping weight on. Unfortunatley, cysto today with new bladder cancer.     Past Medical History:  Diagnosis Date  . BPH (benign prostatic hyperplasia)   . Cancer [facility]) 1993   Colon surgery and chemo , 2-19 left ureteral camcer last chemo tx 08-27-17  . DDD (degenerative disc disease), lumbar    L4-L5  . Depression   .  Early stage glaucoma    both eyes  . Hearing loss   . Hematuria, gross   . History of adenomatous polyp of colon   . History of carpal tunnel syndrome    left  . History of colon cancer    dx 1993  s/p  hemicolectomy (malignant tumor x2)  and chemo therapy completed 1993--- no recurrence per pt  . History of shingles   . HOH (hard of hearing)    right ear  . Hyperlipidemia   . Insomnia     Past Surgical History:  Procedure Laterality Date  . APPENDECTOMY    . CARPAL TUNNEL RELEASE Left 08/13/2013   Procedure: CARPAL TUNNEL RELEASE LEFT WRIST;  Surgeon: Yvette Rack., MD;  Location: [place];  Service: Orthopedics;  Laterality: Left;  . COLONOSCOPY    . CYSTOSCOPY W/ RETROGRADES Bilateral 04/03/2017   Procedure: CYSTOSCOPY WITH RETROGRADE PYELOGRAM, LEFT URETEROSCOPY, LEFT STENT PLACEMENT;  Surgeon: Franchot Gallo, MD;  Location: [facility];  Service: Urology;  Laterality: Bilateral;  . CYSTOSCOPY W/ URETERAL STENT PLACEMENT Left 04/23/2017   Procedure: CYSTOSCOPY WITH STENT REPLACEMENT;  Surgeon: Alexis Frock, MD;  Location: [facility];  Service: Urology;  Laterality: Left;  . CYSTOSCOPY WITH FULGERATION Left 04/03/2017   Procedure: LEFT URETERAL BIOPSY;  Surgeon: Franchot Gallo, MD;  Location: [facility];  Service: Urology;  Laterality: Left;  . CYSTOSCOPY/RETROGRADE/URETEROSCOPY Left 04/23/2017   Procedure: CYSTOSCOPY/RETROGRADE/URETEROSCOPY, LEFT  URETEROSCOPY  WITY  BIOPSY;  Surgeon: Alexis Frock, MD;  Location[facility];  Service: Urology;  Laterality: Left;  . HEMICOLECTOMY  1993   malignant tumor x2 , colon cancer and Appendectomy  . INGUINAL HERNIA REPAIR Right 2003  . IR FLUORO GUIDE PORT INSERTION RIGHT  05/30/2017  . IR US GUIDE VASC ACCESS RIGHT  05/30/2017  . MASTOIDECTOMY Right 2015  . ROBOT ASSITED LAPAROSCOPIC NEPHROURETERECTOMY Left 09/24/2017   Procedure: XI ROBOT ASSITED  LAPAROSCOPIC NEPHROURETERECTOMY;  Surgeon: Alexis Frock, MD;  Location: [facility];  Service: Urology;  Laterality: Left;  . ROBOTIC ASSISTED LAPAROSCOPIC LYSIS OF ADHESION  09/24/2017   Procedure: XI ROBOTIC ASSISTED LAPAROSCOPIC EXTENSIVE LYSIS OF ADHESION;  Surgeon: Alexis Frock, MD;  Location: [facility];  Service: Urology;;  . Lonell Face  last one 06/ 2018  . TRANSURETHRAL RESECTION OF PROSTATE N/A 09/02/2016   Procedure: TRANSURETHRAL RESECTION OF THE PROSTATE (TURP);  Surgeon: Franchot Gallo, MD;  Location: [facility];  Service: Urology;  Laterality: N/A;    History reviewed. No pertinent family history. Social History:  reports that he has never smoked. He has never used smokeless tobacco. He reports that he drinks about 1.0 standard drinks of alcohol per week. He reports that he does not use drugs.  Allergies: No Known Allergies  No medications prior to admission.    No results found for this or any previous visit (from the past 48 hour(s)). No results found.  Review of Systems  Constitutional: Positive for weight loss. Negative for fever.  HENT: Negative.   Eyes: Negative.   Respiratory: Negative.   Cardiovascular: Negative.   Gastrointestinal: Negative.   Genitourinary: Positive for hematuria. Negative for flank pain.  Musculoskeletal: Negative.   Skin: Negative.   Neurological: Negative.   Endo/Heme/Allergies: Negative.   Psychiatric/Behavioral: Negative.     Height 5\' 9"  (1.753 m), weight 78.9 kg. Physical Exam  Constitutional: He appears well-developed.  HENT:  Head: Normocephalic.  Eyes: Pupils are equal, round, and reactive to light.  Neck: Normal range of motion.  Cardiovascular: Normal rate.  Respiratory: Effort normal.  GI:  Multiple scars w/o hernias.   Genitourinary:  Genitourinary Comments: No CVAT at present.   Musculoskeletal: Normal range of motion.  Neurological: He is alert.  Skin: Skin is warm.  Psychiatric: He has a  normal mood and affect.     Assessment/Plan  Proceed as planned with TURBT / Rt Retrograde with diagnostic and theraputic intent. Risks, benefits, alternatives, expected peri-op course discussed previously and reiterated today including need for possible peri-op foley during initial bladder healing.   Alexis Frock, MD 12/12/2017, 5:22 AM

## 2017-12-15 ENCOUNTER — Encounter (HOSPITAL_BASED_OUTPATIENT_CLINIC_OR_DEPARTMENT_OTHER): Payer: Self-pay | Admitting: Urology

## 2017-12-25 ENCOUNTER — Telehealth: Payer: Self-pay | Admitting: Hematology

## 2017-12-25 NOTE — Telephone Encounter (Signed)
Called pt re appts being moved due to [place] being out on PAL- spoke w/ pt re appts.

## 2017-12-26 ENCOUNTER — Encounter (HOSPITAL_COMMUNITY)
Admission: RE | Admit: 2017-12-26 | Discharge: 2017-12-26 | Disposition: A | Discharge status: Home / Self Care | Payer: Medicare Other | Source: Ambulatory Visit | Attending: Hematology | Admitting: Hematology

## 2017-12-26 DIAGNOSIS — C689 Malignant neoplasm of urinary organ, unspecified: Secondary | ICD-10-CM | POA: Diagnosis present

## 2017-12-26 DIAGNOSIS — C8330 Diffuse large B-cell lymphoma, unspecified site: Secondary | ICD-10-CM | POA: Insufficient documentation

## 2017-12-26 LAB — GLUCOSE, CAPILLARY: Glucose-Capillary: 79 mg/dL (ref 70–99)

## 2017-12-26 MED ORDER — FLUDEOXYGLUCOSE F - 18 (FDG) INJECTION
8.9200 | Freq: Once | INTRAVENOUS | Status: AC | PRN
  Administered 2017-12-26: 8.92 via INTRAVENOUS

## 2017-12-31 ENCOUNTER — Ambulatory Visit: Payer: Medicare Other | Admitting: Hematology

## 2018-01-13 NOTE — Progress Notes (Signed)
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 male who has been referred to [place] by urologist Dr Alexis Frock for evaluation and management of newly diagnosed diffuse large B Cell Lymphoma. He is accompanied today by his wife. The pt reports that he is doing well overall.   The pt reports that in 1993 he had colon cancer with two tumors in the ascending and descending colon, which was picked up from a routine physical. He notes that he was treated with chemotherapy with Levamisole and Leukovorin for 6 months. He has not had any issues with the colon cancer since. He continue to see Dr. Cristina Gong for his GI.   He notes that he has had blood in the urine for a year and a half, picked up in a physical. This prompted his TURP surgery in July 2018. He notes that the bleeding persisted after the surgery. He notes that a stent was placed which has significantly reduced his hematuria. In February 2019 his left uretral mass was identified and subsequently biopsied confirming adenocarcinoma. The pt notes that Dr. Tresa Moore has up to this point been considering removing one of his kidneys, which would have a bearing on our treatment.   A 04/21/17 CT Abdomen identified a right retroperitoneal mass which was biopsied on 05/08/17 which revealed Large B-Cell Lymphoma.   He notes no ongoing health problems and is without physical limitations. He denies having had any blood transfusions.   On 04/23/17 the pt had a Uretral biopsy revealing Ureter, biopsy, Left mass- SUPERFICIAL FRAGMENTS OF UROTHELIAL CARCINOMA ASSOCIATED WITH GRANULATION TISSUE AND NECROSIS.   CT Abdomen of 04/21/17 revealed large right para-aortic retroperitoneal nodal  conglomeration in the abdomen, just inferior to the right renal artery. Imaging features compatible with metastatic disease in this patient with a history of prostate and left urothelial cancer. 2. Aortic atherosclerosis.   Soft Tissue Needle/core biopsy completed on 05/08/17 with results revealing Large B-Cell Lymphoma.   Most recent lab results (05/08/17) of CBC  is as follows: all values are WNL except for WBC at 11.3k, Monocytes Abs at 1.1k, CO2 at 19, Glucose at 109, Creatinine at 1.54, ALT at 16.  On review of systems, pt reports infrequent hematuria, mild back pain associated with golfing, and denies leg swelling, flank pain, fevers, chills, night sweats and any other symptoms.   On PMHx the pt reports Colon cancer in 1993, hypercholesterolemia, glaucoma, low risk prostate cancer, mastoiditis, hernia repair, carpel tunnel. He denies heart or lung problems.  On Social Hx the pt denies smoking and ETOH consumption. On Family Hx the pt reports that his mother had breast cancer when she was roughly 26, and also cervical cancer. On his mother's side, his aunts and uncles had several cancers including lung. Maternal grandfather had prostate cancer.   Interval History:  Jonathan Marshall returns today for management and evaluation of his Diffuse Large B-Cell Lymphoma and Urothelial carcinoma. [name] was on 11/26/17. The pt reports that he is doing well overall.   The pt reports that he has not had any new concerns in the interim. He is now pursuing BCG as of yesterday with urology.   The pt notes that his  weight is stable, his appetite has returned and he is eating well. He notes that his energy levels have continued to improve. He notes that his strength has not returned yet, which he notices when playing golf.   The pt notes that he has had some mild night sweats recently which he associates with his medications.   Of note since the patient's last visit, pt has had a  PET/CT completed on 12/26/17 with results revealing No evidence of lymphoma recurrence on FDG PET scan. 2. Continued decrease in size and metabolic activity retroperitoneal periaortic mass. ( Deauville 2) 3. LEFT nephrectomy.  On review of systems, pt reports stable weight, improved energy levels, eating well, and denies abdominal pains, leg swelling, and any other symptoms.    MEDICAL HISTORY:  Past Medical History:  Diagnosis Date  . BPH (benign prostatic hyperplasia)   . Cancer [facility] 1993   Colon surgery and chemo , 2-19 left ureteral camcer last chemo tx 08-27-17  . DDD (degenerative disc disease), lumbar    L4-L5  . Depression   . Early stage glaucoma    both eyes  . Hearing loss   . Hematuria, gross   . History of adenomatous polyp of colon   . History of carpal tunnel syndrome    left  . History of colon cancer    dx 1993  s/p  hemicolectomy (malignant tumor x2)  and chemo therapy completed 1993--- no recurrence per pt  . History of shingles   . HOH (hard of hearing)    right ear  . Hyperlipidemia   . Insomnia     SURGICAL HISTORY: Past Surgical History:  Procedure Laterality Date  . APPENDECTOMY    . CARPAL TUNNEL RELEASE Left 08/13/2013   Procedure: CARPAL TUNNEL RELEASE LEFT WRIST;  Surgeon: Yvette Rack., MD;  Location: [place];  Service: Orthopedics;  Laterality: Left;  . COLONOSCOPY    . CYSTOSCOPY W/ RETROGRADES Bilateral 04/03/2017   Procedure: CYSTOSCOPY WITH RETROGRADE PYELOGRAM, LEFT URETEROSCOPY, LEFT STENT PLACEMENT;  Surgeon: Franchot Gallo, MD;  Location: [facility];  Service: Urology;  Laterality: Bilateral;  . CYSTOSCOPY W/ RETROGRADES Bilateral 12/12/2017   Procedure: CYSTOSCOPY WITH RETROGRADE PYELOGRAM;  Surgeon: Alexis Frock, MD;  Location: [facility];  Service: Urology;  Laterality: Bilateral;  . CYSTOSCOPY W/ URETERAL STENT PLACEMENT Left 04/23/2017   Procedure: CYSTOSCOPY WITH STENT  REPLACEMENT;  Surgeon: Alexis Frock, MD;  Location: [facility];  Service: Urology;  Laterality: Left;  . CYSTOSCOPY WITH FULGERATION Left 04/03/2017   Procedure: LEFT URETERAL BIOPSY;  Surgeon: Franchot Gallo, MD;  Location[facility];  Service: Urology;  Laterality: Left;  . CYSTOSCOPY/RETROGRADE/URETEROSCOPY Left 04/23/2017   Procedure: CYSTOSCOPY/RETROGRADE/URETEROSCOPY, LEFT  URETEROSCOPY WITY  BIOPSY;  Surgeon: Alexis Frock, MD;  Location: [facility];  Service: Urology;  Laterality: Left;  . HEMICOLECTOMY  1993   malignant tumor x2 , colon cancer and Appendectomy  . INGUINAL HERNIA REPAIR Right 2003  . IR FLUORO GUIDE PORT INSERTION RIGHT  05/30/2017  . IR US GUIDE VASC ACCESS RIGHT  05/30/2017  . MASTOIDECTOMY Right 2015  . ROBOT ASSITED LAPAROSCOPIC NEPHROURETERECTOMY Left 09/24/2017   Procedure: XI ROBOT ASSITED LAPAROSCOPIC NEPHROURETERECTOMY;  Surgeon: Alexis Frock, MD;  Location: [facility];  Service: Urology;  Laterality: Left;  . ROBOTIC ASSISTED LAPAROSCOPIC LYSIS OF ADHESION  09/24/2017   Procedure: XI ROBOTIC ASSISTED LAPAROSCOPIC EXTENSIVE LYSIS OF ADHESION;  Surgeon: Alexis Frock, MD;  Location:  [facility];  Service: Urology;;  . SIGMOIDOSCOPY  last one 06/ 2018  . TRANSURETHRAL RESECTION OF BLADDER TUMOR N/A 12/12/2017   Procedure: TRANSURETHRAL RESECTION OF BLADDER TUMOR (TURBT);  Surgeon: Alexis Frock, MD;  Location: [facility];  Service: Urology;  Laterality: N/A;  . TRANSURETHRAL RESECTION OF PROSTATE N/A 09/02/2016   Procedure: TRANSURETHRAL RESECTION OF THE PROSTATE (TURP);  Surgeon: Franchot Gallo, MD;  Location: [facility];  Service: Urology;  Laterality: N/A;    SOCIAL HISTORY: Social History   Socioeconomic History  . Marital status: Married    Spouse name: Not on file  . Number of children: Not on file  . Years of education: Not on file  . Highest education level: Not on  file  Occupational History  . Not on file  Social Needs  . Financial resource strain: Not on file  . Food insecurity:    Worry: Not on file    Inability: Not on file  . Transportation needs:    Medical: Not on file    Non-medical: Not on file  Tobacco Use  . Smoking status: Never Smoker  . Smokeless tobacco: Never Used  Substance and Sexual Activity  . Alcohol use: Yes    Alcohol/week: 1.0 standard drinks    Types: 1 Glasses of wine per week    Comment: rare  . Drug use: Never  . Sexual activity: Not Currently  Lifestyle  . Physical activity:    Days per week: Not on file    Minutes per session: Not on file  . Stress: Not on file  Relationships  . Social connections:    Talks on phone: Not on file    Gets together: Not on file    Attends religious service: Not on file    Active member of club or organization: Not on file    Attends meetings of clubs or organizations: Not on file    Relationship status: Not on file  . Intimate partner violence:    Fear of current or ex partner: Not on file    Emotionally abused: Not on file    Physically abused: Not on file    Forced sexual activity: Not on file  Other Topics Concern  . Not on file  Social History Narrative  . Not on file    FAMILY HISTORY: History reviewed. No pertinent family history.  ALLERGIES:  has No Known Allergies.  MEDICATIONS:  Current Outpatient Medications  Medication Sig Dispense Refill  . ciprofloxacin (CIPRO) 250 MG tablet Take 250 mg by mouth 2 (two) times daily. Take on 12-10-17 and 12-11-17    . dronabinol (MARINOL) 5 MG capsule Take 1 capsule (5 mg total) by mouth 2 (two) times daily before a meal. (Patient not taking: Reported on 01/14/2018) 60 capsule 0  . latanoprost (XALATAN) 0.005 % ophthalmic solution Place 1 drop into both eyes at bedtime.    . lovastatin (MEVACOR) 40 MG tablet Take 40 mg by mouth every evening.    . mirtazapine (REMERON SOL-TAB) 15 MG disintegrating tablet Take 0.5  tablets (7.5 mg total) by mouth at bedtime. (Patient not taking: Reported on 01/14/2018) 30 tablet 0  . sertraline (ZOLOFT) 25 MG tablet Take 25 mg by mouth every morning.     . traMADol (ULTRAM) 50 MG tablet Take 1 tablet (50 mg total) by mouth every 6 (six) hours as needed for moderate pain. Post-operatively (Patient not taking: Reported on 01/14/2018) 15 tablet 0   No current facility-administered  medications for this visit.     REVIEW OF SYSTEMS:    A 10+ POINT REVIEW OF SYSTEMS WAS OBTAINED including neurology, dermatology, psychiatry, cardiac, respiratory, lymph, extremities, GI, GU, Musculoskeletal, constitutional, breasts, reproductive, HEENT.  All pertinent positives are noted in the HPI.  All others are negative.   PHYSICAL EXAMINATION: ECOG PERFORMANCE STATUS: 1 - Symptomatic but completely ambulatory  Vitals:   01/14/18 1357  BP: 120/70  Pulse: 61  Resp: 18  Temp: 97.9 F (36.6 C)  SpO2: 100%   Filed Weights   01/14/18 1357  Weight: 178 lb 8 oz (81 kg)   .Body mass index is 26.36 kg/m.  GENERAL:alert, in no acute distress and comfortable SKIN: no acute rashes, no significant lesions EYES: conjunctiva are pink and non-injected, sclera anicteric OROPHARYNX: MMM, no exudates, no oropharyngeal erythema or ulceration NECK: supple, no JVD LYMPH:  no palpable lymphadenopathy in the cervical, axillary or inguinal regions LUNGS: clear to auscultation b/l with normal respiratory effort HEART: regular rate & rhythm ABDOMEN:  normoactive bowel sounds , non tender, not distended. No palpable hepatosplenomegaly.  Extremity: no pedal edema PSYCH: alert & oriented x 3 with fluent speech NEURO: no focal motor/sensory deficits   LABORATORY DATA:  I have reviewed the data as listed  . CBC Latest Ref Rng & Units 01/14/2018 11/26/2017 11/06/2017  WBC 4.0 - 10.5 K/uL 9.2 9.8 6.5  Hemoglobin 13.0 - 17.0 g/dL 11.9(L) 10.6(L) 10.1(L)  Hematocrit 39.0 - 52.0 % 37.6(L) 33.9(L)  32.4(L)  Platelets 150 - 400 K/uL 217 280 214    . CMP Latest Ref Rng & Units 01/14/2018 11/26/2017 11/06/2017  Glucose 70 - 99 mg/dL 78 100(H) 105(H)  BUN 8 - 23 mg/dL 23 18 26(H)  Creatinine 0.61 - 1.24 mg/dL 2.20(H) 2.44(H) 2.63(H)  Sodium 135 - 145 mmol/L 141 140 143  Potassium 3.5 - 5.1 mmol/L 4.3 4.5 5.1  Chloride 98 - 111 mmol/L 114(H) 110 115(H)  CO2 22 - 32 mmol/L 19(L) 21(L) 20(L)  Calcium 8.9 - 10.3 mg/dL 9.8 9.4 9.3  Total Protein 6.5 - 8.1 g/dL 7.0 6.6 6.0(L)  Total Bilirubin 0.3 - 1.2 mg/dL 0.5 <0.2(L) <0.2(L)  Alkaline Phos 38 - 126 U/L 61 58 54  AST 15 - 41 U/L 15 13(L) 10(L)  ALT 0 - 44 U/L 18 8 <6    Component     Latest Ref Rng & Units 05/15/2017  Retic Ct Pct     0.8 - 1.8 % 1.6  RBC.     4.20 - 5.82 MIL/uL 4.62  Retic Count, Absolute     34.8 - 93.9 K/uL 73.9  Prothrombin Time     11.4 - 15.2 seconds 14.5  INR      1.13  APTT     24 - 36 seconds 31  HIV Screen 4th Generation wRfx     Non Reactive Non Reactive  HCV Ab     0.0 - 0.9 s/co ratio <0.1  Hep B Core Ab, Tot     Negative Negative  Hepatitis B Surface Ag     Negative Negative  LDH     125 - 245 U/L 132   . Lab Results  Component Value Date   LDH 108 01/14/2018   12/12/17 Biopsy:    05/08/17 Soft Tissue Needle Core Biopsy:   04/23/17 Ureter Biopsy:   RADIOGRAPHIC STUDIES: I have personally reviewed the radiological images as listed and agreed with the findings in the report. Nm Pet Image Restag (ps)  Skull Base To Thigh  Result Date: 12/26/2017 CLINICAL DATA:  Subsequent treatment strategy for diffuse large B-cell lymphoma. EXAM: NUCLEAR MEDICINE PET SKULL BASE TO THIGH TECHNIQUE: 8.9 mCi F-18 FDG was injected intravenously. Full-ring PET imaging was performed from the skull base to thigh after the radiotracer. CT data was obtained and used for attenuation correction and anatomic localization. Fasting blood glucose: 79 mg/dl COMPARISON:  07/28/17 FINDINGS: Mediastinal blood pool  activity: SUV max 2.1 NECK: No hypermetabolic lymph nodes in the neck. Incidental CT findings: none CHEST: No hypermetabolic mediastinal or hilar nodes. No suspicious pulmonary nodules on the CT scan. Incidental CT findings: Port in the anterior chest wall with tip in distal SVC. ABDOMEN/PELVIS: No abnormal hypermetabolic activity within the liver, pancreas, adrenal glands, or spleen. No hypermetabolic lymph nodes in the abdomen or pelvis. Mild retroperitoneal thickening to measuring 2.1 x 1.4 cm continues to decrease in size from 2.8 x 2.6 cm. No metabolic activity above background blood pool. Incidental CT findings: LEFT nephrectomy. No nodularity nephrectomy bed. SKELETON: No focal hypermetabolic activity to suggest skeletal metastasis. Incidental CT findings: none IMPRESSION: 1. No evidence of lymphoma recurrence on FDG PET scan. 2. Continued decrease in size and metabolic activity retroperitoneal periaortic mass. ( Deauville 2) 3. LEFT nephrectomy. Electronically Signed   By: Suzy Bouchard M.D.   On: 12/26/2017 14:27   04/21/17 CT Abdomen   ASSESSMENT & PLAN:   84 y.o. male with  1. Recently diagnosed Diffuse Large B Cell Lymphoma - Stage II -bulky  05/26/17 PET/CT which showed 1. 5 cm right-sided retroperitoneal nodal mass is hypermetabolic and consistent with known lymphoma. Small lymph node just below the aortic bifurcation is also hypermetabolic. -ECHO shows normal EF.  S/p 4 cycles of R-mini CHOP  07/28/17 PET/CT which revealed Significant response to interval therapy. The large retroperitoneal mass is significantly smaller with minimal residual activity, slightly greater than blood pool (Deauville 3). No evidence of disease progression.  -Recommend Ensure/boost for nutrition supplement   2. 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  -s/p left nephroureterectomy pm 09/24/2017 PLAN: -continue urologic f/u with  Dr Tresa Moore.  3. S/p post operative Acute UTI with E.coli with sepsis and dehydration 4.s/p  AKI on CKD- single kidney. - likely due to poor po intake- creatinine improved with IVF 5. Protein calorie malnutrition-moderate 6. Diarrhea - neg GI panel -- resolved  PLAN -Increased marinol to 5mg  BID to boost appetite -Avoid NSAIDs, infections and dehydration to protect kidneys -Tylenol if necessary -Discussed the 12/26/17 PET/CT which revealed No evidence of lymphoma recurrence on FDG PET scan. 2. Continued decrease in size and metabolic activity retroperitoneal periaortic mass. (Deauville 2) 3. LEFT nephrectomy.  -S/p TURB tumor on 12/12/17 with Dr. Tresa Moore in urology.  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 -Pt is currently pursuing local instillation of BCG with urology  -Discussed the consideration of RT which the pt prefers. Will refer the pt to Rad Onc  -Recommended that the pt continue to eat well, drink at least 48-64 oz of water each day, and walk 20-30 minutes each day.  -Will order labs today and port flush -Will see the pt back in 2 months    Port flush with labs toady Referral to radiation oncology for consideration of ISRT for lymphoma RTC with Dr Irene Limbo in 2 months with port flush and  labs    All of the patients questions were answered with apparent satisfaction. The patient knows to call the clinic with any problems, questions or concerns.  The total time spent in the appt was 25 minutes and more than 50% was on counseling and direct patient cares.   Sullivan Lone MD MS AAHIVMS [facility] Hematology/Oncology Physician [facility]  (Office):       [phone] (Work cell):  [phone] (Fax):           [phone]  01/14/2018 2:47 PM  I, Baldwin Jamaica, am acting as a scribe for Dr. Sullivan Lone.   .I have reviewed the above  documentation for accuracy and completeness, and I agree with the above. Brunetta Genera MD

## 2018-01-14 ENCOUNTER — Inpatient Hospital Stay: Payer: Medicare Other

## 2018-01-14 ENCOUNTER — Telehealth: Payer: Self-pay | Admitting: Hematology

## 2018-01-14 ENCOUNTER — Inpatient Hospital Stay: Payer: Medicare Other | Attending: Hematology | Admitting: Hematology

## 2018-01-14 ENCOUNTER — Encounter: Payer: Self-pay | Admitting: Hematology

## 2018-01-14 VITALS — BP 120/70 | HR 61 | Temp 97.9°F | Resp 18 | Ht 69.0 in | Wt 178.5 lb

## 2018-01-14 DIAGNOSIS — Z85038 Personal history of other malignant neoplasm of large intestine: Secondary | ICD-10-CM

## 2018-01-14 DIAGNOSIS — Z95828 Presence of other vascular implants and grafts: Secondary | ICD-10-CM

## 2018-01-14 DIAGNOSIS — C662 Malignant neoplasm of left ureter: Secondary | ICD-10-CM | POA: Diagnosis not present

## 2018-01-14 DIAGNOSIS — C8330 Diffuse large B-cell lymphoma, unspecified site: Secondary | ICD-10-CM

## 2018-01-14 DIAGNOSIS — Z7189 Other specified counseling: Secondary | ICD-10-CM

## 2018-01-14 DIAGNOSIS — Z8572 Personal history of non-Hodgkin lymphomas: Secondary | ICD-10-CM | POA: Diagnosis not present

## 2018-01-14 DIAGNOSIS — C689 Malignant neoplasm of urinary organ, unspecified: Secondary | ICD-10-CM

## 2018-01-14 DIAGNOSIS — Z905 Acquired absence of kidney: Secondary | ICD-10-CM | POA: Insufficient documentation

## 2018-01-14 LAB — CBC WITH DIFFERENTIAL/PLATELET
ABS IMMATURE GRANULOCYTES: 0.04 10*3/uL (ref 0.00–0.07)
BASOS ABS: 0 10*3/uL (ref 0.0–0.1)
Basophils Relative: 0 %
EOS PCT: 3 %
Eosinophils Absolute: 0.3 10*3/uL (ref 0.0–0.5)
HCT: 37.6 % — ABNORMAL LOW (ref 39.0–52.0)
HEMOGLOBIN: 11.9 g/dL — AB (ref 13.0–17.0)
Immature Granulocytes: 0 %
LYMPHS PCT: 29 %
Lymphs Abs: 2.7 10*3/uL (ref 0.7–4.0)
MCH: 28.2 pg (ref 26.0–34.0)
MCHC: 31.6 g/dL (ref 30.0–36.0)
MCV: 89.1 fL (ref 80.0–100.0)
Monocytes Absolute: 0.8 10*3/uL (ref 0.1–1.0)
Monocytes Relative: 9 %
NEUTROS ABS: 5.4 10*3/uL (ref 1.7–7.7)
NRBC: 0 % (ref 0.0–0.2)
Neutrophils Relative %: 59 %
Platelets: 217 10*3/uL (ref 150–400)
RBC: 4.22 MIL/uL (ref 4.22–5.81)
RDW: 16.8 % — ABNORMAL HIGH (ref 11.5–15.5)
WBC: 9.2 10*3/uL (ref 4.0–10.5)

## 2018-01-14 LAB — CMP (CANCER CENTER ONLY)
ALBUMIN: 3.9 g/dL (ref 3.5–5.0)
ALK PHOS: 61 U/L (ref 38–126)
ALT: 18 U/L (ref 0–44)
AST: 15 U/L (ref 15–41)
Anion gap: 8 (ref 5–15)
BILIRUBIN TOTAL: 0.5 mg/dL (ref 0.3–1.2)
BUN: 23 mg/dL (ref 8–23)
CALCIUM: 9.8 mg/dL (ref 8.9–10.3)
CO2: 19 mmol/L — AB (ref 22–32)
CREATININE: 2.2 mg/dL — AB (ref 0.61–1.24)
Chloride: 114 mmol/L — ABNORMAL HIGH (ref 98–111)
GFR, Est AFR Am: 30 mL/min — ABNORMAL LOW (ref >60)
GFR, Estimated: 26 mL/min — ABNORMAL LOW (ref >60)
GLUCOSE: 78 mg/dL (ref 70–99)
Potassium: 4.3 mmol/L (ref 3.5–5.1)
SODIUM: 141 mmol/L (ref 135–145)
TOTAL PROTEIN: 7 g/dL (ref 6.5–8.1)

## 2018-01-14 LAB — LACTATE DEHYDROGENASE: LDH: 108 U/L (ref 98–192)

## 2018-01-14 MED ORDER — HEPARIN SOD (PORK) LOCK FLUSH 100 UNIT/ML IV SOLN
500.0000 [IU] | Freq: Once | INTRAVENOUS | Status: AC
  Administered 2018-01-14: 500 [IU]
  Filled 2018-01-14: qty 5

## 2018-01-14 MED ORDER — SODIUM CHLORIDE 0.9% FLUSH
10.0000 mL | Freq: Once | INTRAVENOUS | Status: AC
  Administered 2018-01-14: 10 mL
  Filled 2018-01-14: qty 10

## 2018-01-14 NOTE — Patient Instructions (Signed)
Thank you for choosing [facility] to provide your oncology and hematology care.  To afford each patient quality time with our providers, please arrive 30 minutes before your scheduled appointment time.  If you arrive late for your appointment, you may be asked to reschedule.  We strive to give you quality time with our providers, and arriving late affects you and other patients whose appointments are after yours.    If you are a no show for multiple scheduled visits, you may be dismissed from the clinic at the providers discretion.     Again, thank you for choosing [facility], our hope is that these requests will decrease the amount of time that you wait before being seen by our physicians.  ______________________________________________________________________   Should you have questions after your visit to the [facility], please contact our office at [phone] between the hours of 8:30 and 4:30 p.m.    Voicemails left after 4:30p.m will not be returned until the following business day.     For prescription refill requests, please have your pharmacy contact us directly.  Please also try to allow 48 hours for prescription requests.     Please contact the scheduling department for questions regarding scheduling.  For scheduling of procedures such as PET scans, CT scans, MRI, Ultrasound, etc please contact central scheduling at [phone].     Resources For Cancer Patients and Caregivers:    Oncolink.org:  A wonderful resource for patients and healthcare providers for information regarding your disease, ways to tract your treatment, what to expect, etc.      American Cancer Society:  800-227-2345  Can help patients locate various types of support and financial assistance   Cancer Care: 1-800-813-HOPE (4673) Provides financial assistance, online support groups, medication/co-pay assistance.     [place] DSS:  336-641-3447 Where to apply  for food stamps, Medicaid, and utility assistance   Medicare Rights Center: 800-333-4114 Helps people with Medicare understand their rights and benefits, navigate the Medicare system, and secure the quality healthcare they deserve   SCAT: 336-333-6589 [place] [employer]'s shared-ride transportation service for eligible riders who have a disability that prevents them from riding the fixed route bus.     For additional information on assistance programs please contact our social worker:   Abigail Elmore:  [phone]  

## 2018-01-14 NOTE — Telephone Encounter (Signed)
Gave pt avs and calendar  °

## 2018-01-21 NOTE — Progress Notes (Signed)
Lymphoma Location(s) / Histology:  05/08/17 Diagnosis Soft Tissue Needle Core Biopsy, retroperitoneal mass, right sided - LARGE B-CELL LYMPHOMA  Jonathan Marshall presented months ago with symptoms of: per Jonathan Marshall note- A 04/21/17 CT Abdomen identified a right retroperitoneal mass which was biopsied on 05/08/17 which revealed Large B-Cell Lymphoma  Biopsies of right retroperitoneal mass revealed: large b-cell lymphoma.   Past/Anticipated interventions by medical oncology, if any:  01/14/18 Jonathan Marshall S/p 4 cycles of R-mini CHOP PLAN -Increased marinol to 5mg  BID to boost appetite -Avoid NSAIDs, infections and dehydration to protect kidneys -Tylenol if necessary -Discussed the 12/26/17 PET/CT which revealed No evidence of lymphoma recurrence on FDG PET scan. 2. Continued decrease in size and metabolic activity retroperitoneal periaortic mass. (Deauville 2) 3. LEFT nephrectomy.  -S/p TURB tumor on 12/12/17 with Jonathan Marshall in urology.  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 -Pt is currently pursuing local instillation of BCG with urology  -Discussed the consideration of RT which the pt prefers. Will refer the pt to Rad Onc  -Recommended that the pt continue to eat well, drink at least 48-64 oz of water each day, and walk 20-30 minutes each day.  -Will order labs today and port flush -Will see the pt back in 2 months   Port flush with labs toady Referral to radiation oncology for consideration of ISRT for lymphoma RTC with Dr Irene Marshall in 2 months with port flush and labs   Weight changes, if any, over the past 6 months: He lost weight during chemotherapy. He tells me he has gained back about 5-6 lbs since completing chemotherapy. Overall he tells me he is down 35 lbs since his diagnosis.   Recurrent fevers, or drenching night sweats, if any: He tells me that he has  occasional night sweats.   SAFETY ISSUES:  Prior radiation? No  Pacemaker/ICD? No  Possible current pregnancy? No  Is the patient on methotrexate? No  Current Complaints / other details:    BP 123/62 (BP Location: Right Arm, Patient Position: Sitting)   Pulse (!) 57   Temp 97.9 F (36.6 C) (Oral)   Resp 20   Ht 5\' 9"  (1.753 m)   Wt 180 lb 9.6 oz (81.9 kg)   SpO2 99%   BMI 26.67 kg/m    Wt Readings from Last 3 Encounters:  01/28/18 180 lb 9.6 oz (81.9 kg)  01/14/18 178 lb 8 oz (81 kg)  12/12/17 180 lb 11.2 oz (82 kg)

## 2018-01-27 ENCOUNTER — Ambulatory Visit: Payer: Medicare Other

## 2018-01-27 ENCOUNTER — Ambulatory Visit: Payer: Medicare Other | Admitting: Radiation Oncology

## 2018-01-28 ENCOUNTER — Encounter: Payer: Self-pay | Admitting: Radiation Oncology

## 2018-01-28 ENCOUNTER — Other Ambulatory Visit: Payer: Self-pay

## 2018-01-28 ENCOUNTER — Ambulatory Visit
Admission: RE | Admit: 2018-01-28 | Discharge: 2018-01-28 | Disposition: A | Discharge status: Home / Self Care | Payer: Medicare Other | Source: Ambulatory Visit | Admitting: Radiation Oncology

## 2018-01-28 ENCOUNTER — Ambulatory Visit
Admission: RE | Admit: 2018-01-28 | Discharge: 2018-01-28 | Disposition: A | Discharge status: Home / Self Care | Payer: Medicare Other | Source: Ambulatory Visit | Attending: Radiation Oncology | Admitting: Radiation Oncology

## 2018-01-28 DIAGNOSIS — C8330 Diffuse large B-cell lymphoma, unspecified site: Secondary | ICD-10-CM

## 2018-01-28 DIAGNOSIS — C662 Malignant neoplasm of left ureter: Secondary | ICD-10-CM | POA: Insufficient documentation

## 2018-01-28 DIAGNOSIS — Z86010 Personal history of colonic polyps: Secondary | ICD-10-CM | POA: Insufficient documentation

## 2018-01-28 DIAGNOSIS — C8333 Diffuse large B-cell lymphoma, intra-abdominal lymph nodes: Secondary | ICD-10-CM | POA: Diagnosis present

## 2018-01-28 DIAGNOSIS — Z85038 Personal history of other malignant neoplasm of large intestine: Secondary | ICD-10-CM | POA: Insufficient documentation

## 2018-01-28 DIAGNOSIS — R634 Abnormal weight loss: Secondary | ICD-10-CM | POA: Insufficient documentation

## 2018-01-28 DIAGNOSIS — Z9221 Personal history of antineoplastic chemotherapy: Secondary | ICD-10-CM | POA: Diagnosis not present

## 2018-01-28 DIAGNOSIS — Z79899 Other long term (current) drug therapy: Secondary | ICD-10-CM | POA: Insufficient documentation

## 2018-01-28 DIAGNOSIS — M5136 Other intervertebral disc degeneration, lumbar region: Secondary | ICD-10-CM | POA: Diagnosis not present

## 2018-01-28 DIAGNOSIS — F329 Major depressive disorder, single episode, unspecified: Secondary | ICD-10-CM | POA: Diagnosis not present

## 2018-01-28 DIAGNOSIS — E785 Hyperlipidemia, unspecified: Secondary | ICD-10-CM | POA: Insufficient documentation

## 2018-01-28 DIAGNOSIS — Z510 Encounter for antineoplastic radiation therapy: Secondary | ICD-10-CM | POA: Insufficient documentation

## 2018-01-28 DIAGNOSIS — G4700 Insomnia, unspecified: Secondary | ICD-10-CM | POA: Insufficient documentation

## 2018-01-28 DIAGNOSIS — N400 Enlarged prostate without lower urinary tract symptoms: Secondary | ICD-10-CM | POA: Diagnosis not present

## 2018-01-28 NOTE — Progress Notes (Signed)
Radiation Oncology         [phone] ________________________________  Initial Outpatient Consultation  Name: Jonathan Marshall MRN: [id number]  Date: 01/28/2018  DOB: Mar 17, 1932  MP:NTIRWER, Jori Moll, MD  Brunetta Genera, MD   REFERRING PHYSICIAN: Brunetta Genera, MD  DIAGNOSIS:    ICD-10-CM   1. Diffuse large B-cell lymphoma of intra-abdominal lymph nodes (HCC) C83.33      Cancer Staging Diffuse large B-cell lymphoma (HCC) Staging form: Hodgkin and Non-Hodgkin Lymphoma, AJCC 8th Edition - Clinical: Stage I (Diffuse large B-cell lymphoma) - Signed by Eppie Gibson, MD on 01/28/2018   STAGE IA DLBCL  CHIEF COMPLAINT: Here to discuss management of DLBCL of the retroperitoneum  HISTORY OF PRESENT ILLNESS::Jonathan Marshall is an 84 y.o. male who presented to urology in 2018 with urinary issues and hematuria due to BPH. He underwent TURP in July 2018 which revealed low-grade prostatic adenocarcinoma. His hematuria persisted after surgery, and he had a stent placed in February 2019 which significantly reduced his hematuria. However, a left ureteral mass was identified at that time and subsequently biopsied, confirming urothelial carcinoma.   A CT scan of the abdomen on 04/21/17 identified a right-sided retroperitoneal mass which was biopsied on 05/08/17 and revealed Large B-Cell Lymphoma.   Initial PET scan on 05/29/17 showed a 5 cm right-sided retroperitoneal nodal mass, consistent with known lymphoma. There was also a small lymph node just below the aortic bifurcation. No adenopathy in the neck, chest, axilla, pelvis, or inguinal regions. No findings for osseous metastatic disease.  He received 4 cycles of chemotherapy with R-mini CHOP from 06/03/17 to 08/08/17.  He underwent left nephroureterectomy on 09/24/17 with pathology revealing high grade papillary urothelial carcinoma, non-invasive, margins uninvolved. He then underwent TURBT on 12/12/17 with pathology revealing non-invasive  high grade papillary urothelial carcinoma; muscularis propria not involved.  Subsequent imaging has shown a significant response to therapy, and most recent PET scan on 12/26/17 showed no evidence of lymphoma recurrence with continued decrease in size and metabolic activity of the retroperitoneal periaortic mass, now measuring 2.1 x 1.4 cm [place] 2.  He is currently receiving intravesical BCG Live in urology for his bladder cancer, s/p 3 cycles. He is tolerating this well.  Of note, patient also has a past medical history of colon cancer in 1993 with two tumors in the ascending and descending colon. He subsequently underwent hemicolectomy and received chemotherapy for 6 months. He has not had any issues with colon cancer since. He continues to see Dr. Cristina Gong in GI.  On review of systems, the patient reports having weight loss during chemotherapy. He states that he has gained back 5-6 pounds since completing chemotherapy but overall has lost about 35 pounds since diagnosis. He reports occasional night sweats. He reports only one episode of hematuria since his last surgery. He states that his urinary issues have mostly resolved. He reports fairly regular bowel movements s/p colon resections.  PREVIOUS RADIATION THERAPY: No  PAST MEDICAL HISTORY:  has a past medical history of BPH (benign prostatic hyperplasia), Cancer ([place]) (1993), DDD (degenerative disc disease), lumbar, Depression, Early stage glaucoma, Hearing loss, Hematuria, gross, History of adenomatous polyp of colon, History of carpal tunnel syndrome, History of colon cancer, History of shingles, HOH (hard of hearing), Hyperlipidemia, and Insomnia.    PAST SURGICAL HISTORY: Past Surgical History:  Procedure Laterality Date  . APPENDECTOMY    . CARPAL TUNNEL RELEASE Left 08/13/2013   Procedure: CARPAL TUNNEL RELEASE LEFT WRIST;  Surgeon: Viona Gilmore  D Valeta Harms., MD;  Location: [facility];  Service: Orthopedics;  Laterality: Left;  .  COLONOSCOPY    . CYSTOSCOPY W/ RETROGRADES Bilateral 04/03/2017   Procedure: CYSTOSCOPY WITH RETROGRADE PYELOGRAM, LEFT URETEROSCOPY, LEFT STENT PLACEMENT;  Surgeon: Franchot Gallo, MD;  Location: [facility];  Service: Urology;  Laterality: Bilateral;  . CYSTOSCOPY W/ RETROGRADES Bilateral 12/12/2017   Procedure: CYSTOSCOPY WITH RETROGRADE PYELOGRAM;  Surgeon: Alexis Frock, MD;  Location: [facility];  Service: Urology;  Laterality: Bilateral;  . CYSTOSCOPY W/ URETERAL STENT PLACEMENT Left 04/23/2017   Procedure: CYSTOSCOPY WITH STENT REPLACEMENT;  Surgeon: Alexis Frock, MD;  Location: [facility];  Service: Urology;  Laterality: Left;  . CYSTOSCOPY WITH FULGERATION Left 04/03/2017   Procedure: LEFT URETERAL BIOPSY;  Surgeon: Franchot Gallo, MD;  Location: [facility];  Service: Urology;  Laterality: Left;  . CYSTOSCOPY/RETROGRADE/URETEROSCOPY Left 04/23/2017   Procedure: CYSTOSCOPY/RETROGRADE/URETEROSCOPY, LEFT  URETEROSCOPY WITY  BIOPSY;  Surgeon: Alexis Frock, MD;  Location: [facility];  Service: Urology;  Laterality: Left;  . HEMICOLECTOMY  1993   malignant tumor x2 , colon cancer and Appendectomy  . INGUINAL HERNIA REPAIR Right 2003  . IR FLUORO GUIDE PORT INSERTION RIGHT  05/30/2017  . IR US GUIDE VASC ACCESS RIGHT  05/30/2017  . MASTOIDECTOMY Right 2015  . ROBOT ASSITED LAPAROSCOPIC NEPHROURETERECTOMY Left 09/24/2017   Procedure: XI ROBOT ASSITED LAPAROSCOPIC NEPHROURETERECTOMY;  Surgeon: Alexis Frock, MD;  Location: [facility];  Service: Urology;  Laterality: Left;  . ROBOTIC ASSISTED LAPAROSCOPIC LYSIS OF ADHESION  09/24/2017   Procedure: XI ROBOTIC ASSISTED LAPAROSCOPIC EXTENSIVE LYSIS OF ADHESION;  Surgeon: Alexis Frock, MD;  Location: [facility];  Service: Urology;;  . Lonell Face  last one 06/ 2018  . TRANSURETHRAL RESECTION OF BLADDER TUMOR N/A 12/12/2017   Procedure: TRANSURETHRAL RESECTION OF  BLADDER TUMOR (TURBT);  Surgeon: Alexis Frock, MD;  Location: [facility];  Service: Urology;  Laterality: N/A;  . TRANSURETHRAL RESECTION OF PROSTATE N/A 09/02/2016   Procedure: TRANSURETHRAL RESECTION OF THE PROSTATE (TURP);  Surgeon: Franchot Gallo, MD;  Location: [facility];  Service: Urology;  Laterality: N/A;    FAMILY HISTORY: family history is not on file.  SOCIAL HISTORY:  reports that he has never smoked. He has never used smokeless tobacco. He reports that he drinks about 1.0 standard drinks of alcohol per week. He reports that he does not use drugs.  ALLERGIES: Patient has no known allergies.  MEDICATIONS:  Current Outpatient Medications  Medication Sig Dispense Refill  . latanoprost (XALATAN) 0.005 % ophthalmic solution Place 1 drop into both eyes at bedtime.    . lovastatin (MEVACOR) 40 MG tablet Take 40 mg by mouth every evening.    . sertraline (ZOLOFT) 25 MG tablet Take 25 mg by mouth every morning.     . timolol (TIMOPTIC) 0.5 % ophthalmic solution     . UNABLE TO FIND Med Name: BCG - inserted into his bladder weekly for 6 weeks. He has completed 3/6 at this time (01/28/18). This helps treat his bladder cancer.     No current facility-administered medications for this encounter.     REVIEW OF SYSTEMS:  A 10+ POINT REVIEW OF SYSTEMS WAS OBTAINED including neurology, dermatology, psychiatry, cardiac, respiratory, lymph, extremities, GI, GU, Musculoskeletal, constitutional, breasts, reproductive, HEENT.  All pertinent positives are noted in the HPI.  All others are negative.   PHYSICAL EXAM:   Vitals - 1 value per visit  56/05/3327  SYSTOLIC 518  DIASTOLIC 62  Pulse 57  Temperature 97.9  Respirations 20  Weight (lb) 180.6  Height 5\' 9"   BMI 26.67  VISIT REPORT    General: Alert and oriented, in no acute distress. HEENT: Head is normocephalic. Extraocular movements are intact. Oropharynx is clear. Neck: Neck is supple, no palpable  cervical or supraclavicular lymphadenopathy. Heart: Regular in rate and rhythm with no murmurs, rubs, or gallops. Chest: Clear to auscultation bilaterally, with no rhonchi, wheezes, or rales. Abdomen: Soft, nontender, nondistended, with no rigidity or guarding. Extremities: No cyanosis or edema. Lymphatics: see Neck Exam Skin: No concerning lesions. Musculoskeletal: Symmetric strength and muscle tone throughout. Neurologic: Cranial nerves II through XII are grossly intact. No obvious focalities. Speech is fluent. Coordination is intact. Psychiatric: Judgment and insight are intact. Affect is appropriate.   ECOG = 0  0 - Asymptomatic (Fully active, able to carry on all predisease activities without restriction)  1 - Symptomatic but completely ambulatory (Restricted in physically strenuous activity but ambulatory and able to carry out work of a light or sedentary nature. For example, light housework, office work)  2 - Symptomatic, <50% in bed during the day (Ambulatory and capable of all self care but unable to carry out any work activities. Up and about more than 50% of waking hours)  3 - Symptomatic, >50% in bed, but not bedbound (Capable of only limited self-care, confined to bed or chair 50% or more of waking hours)  4 - Bedbound (Completely disabled. Cannot carry on any self-care. Totally confined to bed or chair)  5 - Death   Eustace Pen MM, Creech RH, Tormey DC, et al. [phone]). "Toxicity and response criteria of the [facility]". [place] Oncol. 5 (6): 649-55   LABORATORY DATA:  Lab Results  Component Value Date   WBC 9.2 01/14/2018   HGB 11.9 (L) 01/14/2018   HCT 37.6 (L) 01/14/2018   MCV 89.1 01/14/2018   PLT 217 01/14/2018   CMP     Component Value Date/Time   NA 141 01/14/2018 1518   K 4.3 01/14/2018 1518   CL 114 (H) 01/14/2018 1518   CO2 19 (L) 01/14/2018 1518   GLUCOSE 78 01/14/2018 1518   BUN 23 01/14/2018 1518   CREATININE 2.20 (H)  01/14/2018 1518   CALCIUM 9.8 01/14/2018 1518   PROT 7.0 01/14/2018 1518   ALBUMIN 3.9 01/14/2018 1518   AST 15 01/14/2018 1518   ALT 18 01/14/2018 1518   ALKPHOS 61 01/14/2018 1518   BILITOT 0.5 01/14/2018 1518   GFRNONAA 26 (L) 01/14/2018 1518   GFRAA 30 (L) 01/14/2018 1518     RADIOGRAPHY: as above - I reviewed him images    IMPRESSION/PLAN: DLBCL Stage IA  Although it has been months since he completed chemotherapy, I think that ISRT would be beneficial to minimize the risk of local recurrence.  There was a lapse in his referral to me as he needed to undergo urgent therapy for his urologic cancer (different primary).  It was a pleasure meeting the patient today. We discussed the risks, benefits, and side effects of radiotherapy. Side effects of this treatment include but are not limited to: fatigue, nausea, diarrhea, and skin irritation. No guarantees of treatment were given. A consent form was signed and placed in the patient's medical record. The patient is enthusiastic about proceeding with treatment. I look forward to participating in the patient's care. Anticipate 4 weeks of radiotherapy.  - Will simulate patient  later this month.  Hold radiation treatment until completion of BCG therapy. Start radiation first week of January.  -Slightly overdue on colonoscopies, patient knows to schedule for next year.  __________________________________________   Eppie Gibson, MD  This document serves as a record of services personally performed by Eppie Gibson, MD. It was created on her behalf by Rae Lips, a trained medical scribe. The creation of this record is based on the scribe's personal observations and the provider's statements to them. This document has been checked and approved by the attending provider.

## 2018-02-13 ENCOUNTER — Ambulatory Visit
Admission: RE | Admit: 2018-02-13 | Discharge: 2018-02-13 | Disposition: A | Discharge status: Home / Self Care | Payer: Medicare Other | Source: Ambulatory Visit | Attending: Radiation Oncology | Admitting: Radiation Oncology

## 2018-02-13 DIAGNOSIS — C8333 Diffuse large B-cell lymphoma, intra-abdominal lymph nodes: Secondary | ICD-10-CM | POA: Diagnosis not present

## 2018-02-13 NOTE — Progress Notes (Signed)
  Radiation Oncology         [phone] ________________________________  Name: SUYASH AMORY MRN: [id number]  Date: 02/13/2018  DOB: [birth year]  SIMULATION AND TREATMENT PLANNING NOTE  Outpatient  DIAGNOSIS:     ICD-10-CM   1. 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  Identity was confirmed.  All relevant records and images related to the planned course of therapy were reviewed.  The patient freely provided informed written consent to proceed with treatment after reviewing the details related to the planned course of therapy. The consent form was witnessed and verified by the simulation staff.    Then, the patient was set-up in a stable reproducible supine position for radiation therapy.  CT images were obtained.  Surface markings were placed.  The CT images were loaded into the planning software.    TREATMENT PLANNING NOTE: Treatment planning then occurred.  The radiation prescription was entered and confirmed.    A total of 1 medically necessary complex treatment devices were fabricated and supervised by me, in the form of vaclock for legs. MORE FIELDS WITH MLCs MAY BE ADDED IN DOSIMETRY for dose homogeneity.  I have requested : Intensity Modulated Radiotherapy (IMRT) is medically necessary for this case for the following reason:  Sparing of single kidney and sparing bowel.    The patient will receive 30.6 Gy in 17 fractions of ISRT to the retroperitoneal region.  -----------------------------------  Eppie Gibson, MD

## 2018-02-19 DIAGNOSIS — C8333 Diffuse large B-cell lymphoma, intra-abdominal lymph nodes: Secondary | ICD-10-CM | POA: Diagnosis not present

## 2018-02-24 ENCOUNTER — Ambulatory Visit
Admission: RE | Admit: 2018-02-24 | Discharge: 2018-02-24 | Disposition: A | Discharge status: Home / Self Care | Payer: Medicare Other | Source: Ambulatory Visit | Attending: Radiation Oncology | Admitting: Radiation Oncology

## 2018-02-24 DIAGNOSIS — C8333 Diffuse large B-cell lymphoma, intra-abdominal lymph nodes: Secondary | ICD-10-CM | POA: Diagnosis not present

## 2018-02-24 NOTE — Progress Notes (Signed)
Pt here for patient teaching.  Pt given Radiation and You booklet and skin care instructions.  Reviewed areas of pertinence such as diarrhea, fatigue, hair loss, nausea and vomiting, skin changes and urinary and bladder changes . Pt able to give teach back of to pat skin, use unscented/gentle soap, use baby wipes, have Imodium on hand, drink plenty of water and sitz bath,. Pt verbalizes understanding of information given and will contact nursing with any questions or concerns.     [email or web address]

## 2018-02-26 ENCOUNTER — Ambulatory Visit
Admission: RE | Admit: 2018-02-26 | Discharge: 2018-02-26 | Disposition: A | Discharge status: Home / Self Care | Payer: Medicare Other | Source: Ambulatory Visit | Attending: Radiation Oncology | Admitting: Radiation Oncology

## 2018-02-26 DIAGNOSIS — R634 Abnormal weight loss: Secondary | ICD-10-CM | POA: Insufficient documentation

## 2018-02-26 DIAGNOSIS — Z510 Encounter for antineoplastic radiation therapy: Secondary | ICD-10-CM | POA: Insufficient documentation

## 2018-02-26 DIAGNOSIS — F329 Major depressive disorder, single episode, unspecified: Secondary | ICD-10-CM | POA: Diagnosis not present

## 2018-02-26 DIAGNOSIS — N400 Enlarged prostate without lower urinary tract symptoms: Secondary | ICD-10-CM | POA: Insufficient documentation

## 2018-02-26 DIAGNOSIS — C8333 Diffuse large B-cell lymphoma, intra-abdominal lymph nodes: Secondary | ICD-10-CM | POA: Insufficient documentation

## 2018-02-26 DIAGNOSIS — M5136 Other intervertebral disc degeneration, lumbar region: Secondary | ICD-10-CM | POA: Insufficient documentation

## 2018-02-26 DIAGNOSIS — Z86010 Personal history of colonic polyps: Secondary | ICD-10-CM | POA: Diagnosis not present

## 2018-02-26 DIAGNOSIS — Z79899 Other long term (current) drug therapy: Secondary | ICD-10-CM | POA: Diagnosis not present

## 2018-02-26 DIAGNOSIS — E785 Hyperlipidemia, unspecified: Secondary | ICD-10-CM | POA: Insufficient documentation

## 2018-02-26 DIAGNOSIS — Z85038 Personal history of other malignant neoplasm of large intestine: Secondary | ICD-10-CM | POA: Diagnosis not present

## 2018-02-26 DIAGNOSIS — G4700 Insomnia, unspecified: Secondary | ICD-10-CM | POA: Diagnosis not present

## 2018-02-26 DIAGNOSIS — Z9221 Personal history of antineoplastic chemotherapy: Secondary | ICD-10-CM | POA: Diagnosis not present

## 2018-02-26 DIAGNOSIS — C662 Malignant neoplasm of left ureter: Secondary | ICD-10-CM | POA: Diagnosis not present

## 2018-02-27 ENCOUNTER — Ambulatory Visit
Admission: RE | Admit: 2018-02-27 | Discharge: 2018-02-27 | Disposition: A | Discharge status: Home / Self Care | Payer: Medicare Other | Source: Ambulatory Visit | Attending: Radiation Oncology | Admitting: Radiation Oncology

## 2018-02-27 DIAGNOSIS — C8333 Diffuse large B-cell lymphoma, intra-abdominal lymph nodes: Secondary | ICD-10-CM | POA: Diagnosis not present

## 2018-03-02 ENCOUNTER — Ambulatory Visit
Admission: RE | Admit: 2018-03-02 | Discharge: 2018-03-02 | Disposition: A | Discharge status: Home / Self Care | Payer: Medicare Other | Source: Ambulatory Visit | Attending: Radiation Oncology | Admitting: Radiation Oncology

## 2018-03-02 ENCOUNTER — Telehealth: Payer: Self-pay | Admitting: *Deleted

## 2018-03-02 DIAGNOSIS — C8333 Diffuse large B-cell lymphoma, intra-abdominal lymph nodes: Secondary | ICD-10-CM | POA: Diagnosis not present

## 2018-03-02 NOTE — Telephone Encounter (Signed)
Called patient to ask about patient coming early today @ 5 pm, spoke with patient and he agreed to come

## 2018-03-03 ENCOUNTER — Ambulatory Visit
Admission: RE | Admit: 2018-03-03 | Discharge: 2018-03-03 | Disposition: A | Discharge status: Home / Self Care | Payer: Medicare Other | Source: Ambulatory Visit | Attending: Radiation Oncology | Admitting: Radiation Oncology

## 2018-03-03 DIAGNOSIS — C8333 Diffuse large B-cell lymphoma, intra-abdominal lymph nodes: Secondary | ICD-10-CM | POA: Diagnosis not present

## 2018-03-04 ENCOUNTER — Ambulatory Visit
Admission: RE | Admit: 2018-03-04 | Discharge: 2018-03-04 | Disposition: A | Discharge status: Home / Self Care | Payer: Medicare Other | Source: Ambulatory Visit | Attending: Radiation Oncology | Admitting: Radiation Oncology

## 2018-03-04 DIAGNOSIS — C8333 Diffuse large B-cell lymphoma, intra-abdominal lymph nodes: Secondary | ICD-10-CM | POA: Diagnosis not present

## 2018-03-05 ENCOUNTER — Ambulatory Visit
Admission: RE | Admit: 2018-03-05 | Discharge: 2018-03-05 | Disposition: A | Discharge status: Home / Self Care | Payer: Medicare Other | Source: Ambulatory Visit | Attending: Radiation Oncology | Admitting: Radiation Oncology

## 2018-03-05 DIAGNOSIS — C8333 Diffuse large B-cell lymphoma, intra-abdominal lymph nodes: Secondary | ICD-10-CM | POA: Diagnosis not present

## 2018-03-06 ENCOUNTER — Ambulatory Visit
Admission: RE | Admit: 2018-03-06 | Discharge: 2018-03-06 | Disposition: A | Discharge status: Home / Self Care | Payer: Medicare Other | Source: Ambulatory Visit | Attending: Radiation Oncology | Admitting: Radiation Oncology

## 2018-03-06 DIAGNOSIS — C8333 Diffuse large B-cell lymphoma, intra-abdominal lymph nodes: Secondary | ICD-10-CM | POA: Diagnosis not present

## 2018-03-09 ENCOUNTER — Ambulatory Visit
Admission: RE | Admit: 2018-03-09 | Discharge: 2018-03-09 | Disposition: A | Discharge status: Home / Self Care | Payer: Medicare Other | Source: Ambulatory Visit | Attending: Radiation Oncology | Admitting: Radiation Oncology

## 2018-03-09 DIAGNOSIS — C8333 Diffuse large B-cell lymphoma, intra-abdominal lymph nodes: Secondary | ICD-10-CM | POA: Diagnosis not present

## 2018-03-10 ENCOUNTER — Ambulatory Visit
Admission: RE | Admit: 2018-03-10 | Discharge: 2018-03-10 | Disposition: A | Discharge status: Home / Self Care | Payer: Medicare Other | Source: Ambulatory Visit | Attending: Radiation Oncology | Admitting: Radiation Oncology

## 2018-03-10 DIAGNOSIS — C8333 Diffuse large B-cell lymphoma, intra-abdominal lymph nodes: Secondary | ICD-10-CM | POA: Diagnosis not present

## 2018-03-11 ENCOUNTER — Ambulatory Visit
Admission: RE | Admit: 2018-03-11 | Discharge: 2018-03-11 | Disposition: A | Discharge status: Home / Self Care | Payer: Medicare Other | Source: Ambulatory Visit | Attending: Radiation Oncology | Admitting: Radiation Oncology

## 2018-03-11 DIAGNOSIS — C8333 Diffuse large B-cell lymphoma, intra-abdominal lymph nodes: Secondary | ICD-10-CM | POA: Diagnosis not present

## 2018-03-12 ENCOUNTER — Ambulatory Visit
Admission: RE | Admit: 2018-03-12 | Discharge: 2018-03-12 | Disposition: A | Discharge status: Home / Self Care | Payer: Medicare Other | Source: Ambulatory Visit | Attending: Radiation Oncology | Admitting: Radiation Oncology

## 2018-03-12 DIAGNOSIS — C8333 Diffuse large B-cell lymphoma, intra-abdominal lymph nodes: Secondary | ICD-10-CM | POA: Diagnosis not present

## 2018-03-13 ENCOUNTER — Ambulatory Visit
Admission: RE | Admit: 2018-03-13 | Discharge: 2018-03-13 | Disposition: A | Discharge status: Home / Self Care | Payer: Medicare Other | Source: Ambulatory Visit | Attending: Radiation Oncology | Admitting: Radiation Oncology

## 2018-03-13 DIAGNOSIS — C8333 Diffuse large B-cell lymphoma, intra-abdominal lymph nodes: Secondary | ICD-10-CM | POA: Diagnosis not present

## 2018-03-16 ENCOUNTER — Ambulatory Visit
Admission: RE | Admit: 2018-03-16 | Discharge: 2018-03-16 | Disposition: A | Discharge status: Home / Self Care | Payer: Medicare Other | Source: Ambulatory Visit | Attending: Radiation Oncology | Admitting: Radiation Oncology

## 2018-03-16 DIAGNOSIS — C8333 Diffuse large B-cell lymphoma, intra-abdominal lymph nodes: Secondary | ICD-10-CM

## 2018-03-16 MED ORDER — SONAFINE EX EMUL
1.0000 "application " | Freq: Once | CUTANEOUS | Status: AC
  Administered 2018-03-16: 1 via TOPICAL

## 2018-03-16 NOTE — Progress Notes (Signed)
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 male who has been referred to [place] by urologist Dr Alexis Frock for evaluation and management of newly diagnosed diffuse large B Cell Lymphoma. He is accompanied today by his wife. The pt reports that he is doing well overall.   The pt reports that in 1993 he had colon cancer with two tumors in the ascending and descending colon, which was picked up from a routine physical. He notes that he was treated with chemotherapy with Levamisole and Leukovorin for 6 months. He has not had any issues with the colon cancer since. He continue to see Dr. Cristina Gong for his GI.   He notes that he has had blood in the urine for a year and a half, picked up in a physical. This prompted his TURP surgery in July 2018. He notes that the bleeding persisted after the surgery. He notes that a stent was placed which has significantly reduced his hematuria. In February 2019 his left uretral mass was identified and subsequently biopsied confirming adenocarcinoma. The pt notes that Dr. Tresa Moore has up to this point been considering removing one of his kidneys, which would have a bearing on our treatment.   A 04/21/17 CT Abdomen identified a right retroperitoneal mass which was biopsied on 05/08/17 which revealed Large B-Cell Lymphoma.   He notes no ongoing health problems and is without physical limitations. He denies having had any blood transfusions.   On 04/23/17 the pt had a Uretral biopsy revealing Ureter, biopsy, Left mass- SUPERFICIAL FRAGMENTS OF UROTHELIAL CARCINOMA ASSOCIATED WITH GRANULATION TISSUE AND NECROSIS.   CT Abdomen of 04/21/17 revealed large right para-aortic retroperitoneal nodal  conglomeration in the abdomen, just inferior to the right renal artery. Imaging features compatible with metastatic disease in this patient with a history of prostate and left urothelial cancer. 2. Aortic atherosclerosis.   Soft Tissue Needle/core biopsy completed on 05/08/17 with results revealing Large B-Cell Lymphoma.   Most recent lab results (05/08/17) of CBC  is as follows: all values are WNL except for WBC at 11.3k, Monocytes Abs at 1.1k, CO2 at 19, Glucose at 109, Creatinine at 1.54, ALT at 16.  On review of systems, pt reports infrequent hematuria, mild back pain associated with golfing, and denies leg swelling, flank pain, fevers, chills, night sweats and any other symptoms.   On PMHx the pt reports Colon cancer in 1993, hypercholesterolemia, glaucoma, low risk prostate cancer, mastoiditis, hernia repair, carpel tunnel. He denies heart or lung problems.  On Social Hx the pt denies smoking and ETOH consumption. On Family Hx the pt reports that his mother had breast cancer when she was roughly 38, and also cervical cancer. On his mother's side, his aunts and uncles had several cancers including lung. Maternal grandfather had prostate cancer.   Interval History:  GARALD Marshall returns today for management and evaluation of his Diffuse Large B-Cell Lymphoma and Urothelial carcinoma. [name] was on 01/14/18. He is accompanied today by his wife. The pt reports that he is doing well overall.   In the interim, the pt has been pursuing RT with Dr. Eppie Gibson.   The pt reports that he will be finishing RT  later this week, and overall he has been tolerating this well. He notes that he is eating well and is staying hydrated with at least 40 oz of water each day. The pt denies any redness in the skin. He also denies changes in his bowel habits.  The pt will see Dr. Tresa Moore in Urology in about two weeks.  Lab results today (03/17/18) of CBC w/diff and CMP is as follows: all  values are WNL except for RBC at 4.20, HGB at 12.7, HCT at 38.8, Chloride at 112, CO2 at 19, BUN at 24, Creatinine at 2.25, AST at 9, GFR at 26. 03/17/18 LDH is <90  On review of systems, pt reports feeling a little tired, eating well, staying hydrated, and denies changes in bowel habits, skin rashes, abdominal pains, pain along the spine, back pain, leg swelling, and any other symptoms.    MEDICAL HISTORY:  Past Medical History:  Diagnosis Date  . BPH (benign prostatic hyperplasia)   . Cancer [facility]) 1993   Colon surgery and chemo , 2-19 left ureteral camcer last chemo tx 08-27-17  . DDD (degenerative disc disease), lumbar    L4-L5  . Depression   . Early stage glaucoma    both eyes  . Hearing loss   . Hematuria, gross   . History of adenomatous polyp of colon   . History of carpal tunnel syndrome    left  . History of colon cancer    dx 1993  s/p  hemicolectomy (malignant tumor x2)  and chemo therapy completed 1993--- no recurrence per pt  . History of shingles   . HOH (hard of hearing)    right ear  . Hyperlipidemia   . Insomnia     SURGICAL HISTORY: Past Surgical History:  Procedure Laterality Date  . APPENDECTOMY    . CARPAL TUNNEL RELEASE Left 08/13/2013   Procedure: CARPAL TUNNEL RELEASE LEFT WRIST;  Surgeon: Yvette Rack., MD;  Location: [place];  Service: Orthopedics;  Laterality: Left;  . COLONOSCOPY    . CYSTOSCOPY W/ RETROGRADES Bilateral 04/03/2017   Procedure: CYSTOSCOPY WITH RETROGRADE PYELOGRAM, LEFT URETEROSCOPY, LEFT STENT PLACEMENT;  Surgeon: Franchot Gallo, MD;  Location[facility];  Service: Urology;  Laterality: Bilateral;  . CYSTOSCOPY W/ RETROGRADES Bilateral 12/12/2017   Procedure: CYSTOSCOPY WITH RETROGRADE PYELOGRAM;  Surgeon: Alexis Frock, MD;  Location: [facility];  Service: Urology;  Laterality: Bilateral;  . CYSTOSCOPY W/ URETERAL STENT PLACEMENT Left 04/23/2017   Procedure: CYSTOSCOPY WITH  STENT REPLACEMENT;  Surgeon: Alexis Frock, MD;  Location: [facility];  Service: Urology;  Laterality: Left;  . CYSTOSCOPY WITH FULGERATION Left 04/03/2017   Procedure: LEFT URETERAL BIOPSY;  Surgeon: Franchot Gallo, MD;  Location: [facility];  Service: Urology;  Laterality: Left;  . CYSTOSCOPY/RETROGRADE/URETEROSCOPY Left 04/23/2017   Procedure: CYSTOSCOPY/RETROGRADE/URETEROSCOPY, LEFT  URETEROSCOPY WITY  BIOPSY;  Surgeon: Alexis Frock, MD;  Location: [facility];  Service: Urology;  Laterality: Left;  . HEMICOLECTOMY  1993   malignant tumor x2 , colon cancer and Appendectomy  . INGUINAL HERNIA REPAIR Right 2003  . IR FLUORO GUIDE PORT INSERTION RIGHT  05/30/2017  . IR US GUIDE VASC ACCESS RIGHT  05/30/2017  . MASTOIDECTOMY Right 2015  . ROBOT ASSITED LAPAROSCOPIC NEPHROURETERECTOMY Left 09/24/2017   Procedure: XI ROBOT ASSITED LAPAROSCOPIC NEPHROURETERECTOMY;  Surgeon: Alexis Frock, MD;  Location: [facility];  Service: Urology;  Laterality: Left;  . ROBOTIC ASSISTED LAPAROSCOPIC LYSIS OF ADHESION  09/24/2017   Procedure: XI ROBOTIC ASSISTED LAPAROSCOPIC EXTENSIVE LYSIS OF ADHESION;  Surgeon: Alexis Frock, MD;  Location: [facility];  Service: Urology;;  . Lonell Face  last one 06/ 2018  . TRANSURETHRAL RESECTION OF BLADDER TUMOR N/A 12/12/2017   Procedure: TRANSURETHRAL RESECTION OF BLADDER TUMOR (TURBT);  Surgeon: Alexis Frock, MD;  Location: [facility];  Service: Urology;  Laterality: N/A;  . TRANSURETHRAL RESECTION OF PROSTATE N/A 09/02/2016   Procedure: TRANSURETHRAL RESECTION OF THE PROSTATE (TURP);  Surgeon: Franchot Gallo, MD;  Location: [facility];  Service: Urology;  Laterality: N/A;    SOCIAL HISTORY: Social History   Socioeconomic History  . Marital status: Married    Spouse name: Not on file  . Number of children: Not on file  . Years of education: Not on file  . Highest education level: Not  on file  Occupational History  . Not on file  Social Needs  . Financial resource strain: Not on file  . Food insecurity:    Worry: Not on file    Inability: Not on file  . Transportation needs:    Medical: No    Non-medical: No  Tobacco Use  . Smoking status: Never Smoker  . Smokeless tobacco: Never Used  Substance and Sexual Activity  . Alcohol use: Yes    Alcohol/week: 1.0 standard drinks    Types: 1 Glasses of wine per week    Comment: rare  . Drug use: Never  . Sexual activity: Not Currently  Lifestyle  . Physical activity:    Days per week: Not on file    Minutes per session: Not on file  . Stress: Not on file  Relationships  . Social connections:    Talks on phone: Not on file    Gets together: Not on file    Attends religious service: Not on file    Active member of club or organization: Not on file    Attends meetings of clubs or organizations: Not on file    Relationship status: Not on file  . Intimate partner violence:    Fear of current or ex partner: Not on file    Emotionally abused: Not on file    Physically abused: Not on file    Forced sexual activity: Not on file  Other Topics Concern  . Not on file  Social History Narrative  . Not on file    FAMILY HISTORY: No family history on file.  ALLERGIES:  has No Known Allergies.  MEDICATIONS:  Current Outpatient Medications  Medication Sig Dispense Refill  . latanoprost (XALATAN) 0.005 % ophthalmic solution Place 1 drop into both eyes at bedtime.    . lovastatin (MEVACOR) 40 MG tablet Take 40 mg by mouth every evening.    . sertraline (ZOLOFT) 25 MG tablet Take 25 mg by mouth every morning.     . timolol (TIMOPTIC) 0.5 % ophthalmic solution     . UNABLE TO FIND Med Name: BCG - inserted into his bladder weekly for 6 weeks. He has completed 3/6 at this time (01/28/18). This helps treat his bladder cancer.     No current facility-administered medications for this visit.     REVIEW OF SYSTEMS:    A  10+ POINT REVIEW OF SYSTEMS WAS OBTAINED including neurology, dermatology, psychiatry, cardiac, respiratory, lymph, extremities, GI, GU, Musculoskeletal, constitutional, breasts, reproductive, HEENT.  All pertinent positives are noted in the HPI.  All others are negative.   PHYSICAL EXAMINATION: ECOG PERFORMANCE STATUS:  1 - Symptomatic but completely ambulatory  Vitals:   03/17/18 1413  BP: 116/67  Pulse: (!) 55  Resp: 18  Temp: (!) 97.5 F (36.4 C)  SpO2: 100%   Filed Weights   03/17/18 1413  Weight: 177 lb 4.8 oz (80.4 kg)   .Body mass index is 26.18 kg/m.  GENERAL:alert, in no acute distress and comfortable SKIN: no acute rashes, no significant lesions EYES: conjunctiva are pink and non-injected, sclera anicteric OROPHARYNX: MMM, no exudates, no oropharyngeal erythema or ulceration NECK: supple, no JVD LYMPH:  no palpable lymphadenopathy in the cervical, axillary or inguinal regions LUNGS: clear to auscultation b/l with normal respiratory effort HEART: regular rate & rhythm ABDOMEN:  normoactive bowel sounds , non tender, not distended. No palpable hepatosplenomegaly.  Extremity: no pedal edema PSYCH: alert & oriented x 3 with fluent speech NEURO: no focal motor/sensory deficits    LABORATORY DATA:  I have reviewed the data as listed  . CBC Latest Ref Rng & Units 03/17/2018 01/14/2018 11/26/2017  WBC 4.0 - 10.5 K/uL 7.9 9.2 9.8  Hemoglobin 13.0 - 17.0 g/dL 12.7(L) 11.9(L) 10.6(L)  Hematocrit 39.0 - 52.0 % 38.8(L) 37.6(L) 33.9(L)  Platelets 150 - 400 K/uL 169 217 280    . CMP Latest Ref Rng & Units 03/17/2018 01/14/2018 11/26/2017  Glucose 70 - 99 mg/dL 82 78 100(H)  BUN 8 - 23 mg/dL 24(H) 23 18  Creatinine 0.61 - 1.24 mg/dL 2.25(H) 2.20(H) 2.44(H)  Sodium 135 - 145 mmol/L 138 141 140  Potassium 3.5 - 5.1 mmol/L 4.3 4.3 4.5  Chloride 98 - 111 mmol/L 112(H) 114(H) 110  CO2 22 - 32 mmol/L 19(L) 19(L) 21(L)  Calcium 8.9 - 10.3 mg/dL 9.3 9.8 9.4  Total Protein 6.5 -  8.1 g/dL 6.8 7.0 6.6  Total Bilirubin 0.3 - 1.2 mg/dL 0.4 0.5 <0.2(L)  Alkaline Phos 38 - 126 U/L 61 61 58  AST 15 - 41 U/L 9(L) 15 13(L)  ALT 0 - 44 U/L 12 18 8     Component     Latest Ref Rng & Units 05/15/2017  Retic Ct Pct     0.8 - 1.8 % 1.6  RBC.     4.20 - 5.82 MIL/uL 4.62  Retic Count, Absolute     34.8 - 93.9 K/uL 73.9  Prothrombin Time     11.4 - 15.2 seconds 14.5  INR      1.13  APTT     24 - 36 seconds 31  HIV Screen 4th Generation wRfx     Non Reactive Non Reactive  HCV Ab     0.0 - 0.9 s/co ratio <0.1  Hep B Core Ab, Tot     Negative Negative  Hepatitis B Surface Ag     Negative Negative  LDH     125 - 245 U/L 132   . Lab Results  Component Value Date   LDH <90 (L) 03/17/2018   12/12/17 Biopsy:    05/08/17 Soft Tissue Needle Core Biopsy:   04/23/17 Ureter Biopsy:   RADIOGRAPHIC STUDIES: I have personally reviewed the radiological images as listed and agreed with the findings in the report. No results found. 04/21/17 CT Abdomen   ASSESSMENT & PLAN:   85 y.o. male with  1. Recently diagnosed Diffuse Large B Cell Lymphoma - Stage II -bulky  05/26/17 PET/CT which showed 1. 5 cm right-sided retroperitoneal nodal mass is hypermetabolic and consistent with known lymphoma. Small lymph node just below the aortic bifurcation is also  hypermetabolic. -ECHO shows normal EF.  S/p 4 cycles of R-mini CHOP  07/28/17 PET/CT which revealed Significant response to interval therapy. The large retroperitoneal mass is significantly smaller with minimal residual activity, slightly greater than blood pool ([place] 3). No evidence of disease progression.  -Recommend Ensure/boost for nutrition supplement  12/26/17 PET/CT revealed No evidence of lymphoma recurrence on FDG PET scan. 2. Continued decrease in size and metabolic activity retroperitoneal periaortic mass. (De[place]ville 2) 3. LEFT nephrectomy.  2. Urothelial Cell Carcinoma -04/23/17 Biopsy of the Left Ureter  Mass revealed a transitional cell carcinoma of the ureter.  -s/p left nephroureterectomy pm 09/24/2017  PLAN: -continue urologic f/u with Dr Tresa Moore.  3. S/p post operative Acute UTI with E.coli with sepsis and dehydration 4.s/p  AKI on CKD- single kidney. - likely due to poor po intake- creatinine improved with IVF 5. Protein calorie malnutrition-moderate 6. 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 S/p TURB tumor on 12/12/17 with Dr. Tresa Moore in urology.  Pt is currently pursuing local instillation of BCG with urology   PLAN -Discussed pt labwork today, 03/17/18; HGB improved to 12.7, Creatinine stable at 2.25, other counts and chemistries are stable. LDH <90.  -Continue follow up with Dr. Isidore Moos for 30.6 Gy in 17 fractions of ISRT -Continue eating well and staying well hydrated  -Increased marinol to 5mg  BID to boost appetite -Avoid NSAIDs, infections and dehydration to protect kidneys. Tylenol if necessary  -Will see the pt back in 2 months    RTC with Dr Irene Limbo in 68months with labs Port flush with labs on followup    All of the patients questions were answered with apparent satisfaction. The patient knows to call the clinic with any problems, questions or concerns.  The total time spent in the appt was 25 minutes and more than 50% was on counseling and direct patient cares.   Sullivan Lone MD MS AAHIVMS [facility] Hematology/Oncology Physician [facility]  (Office):       [phone] (Work cell):  [phone] (Fax):           [phone]  03/17/2018 3:24 PM  I, Baldwin Jamaica, am acting as a scribe for Dr. Sullivan Lone.   .I have reviewed the above documentation for accuracy and completeness, and I agree with the above. Brunetta Genera MD

## 2018-03-17 ENCOUNTER — Inpatient Hospital Stay: Payer: Medicare Other | Attending: Hematology

## 2018-03-17 ENCOUNTER — Telehealth: Payer: Self-pay | Admitting: Hematology

## 2018-03-17 ENCOUNTER — Inpatient Hospital Stay (HOSPITAL_BASED_OUTPATIENT_CLINIC_OR_DEPARTMENT_OTHER): Payer: Medicare Other | Admitting: Hematology

## 2018-03-17 ENCOUNTER — Inpatient Hospital Stay: Payer: Medicare Other

## 2018-03-17 ENCOUNTER — Ambulatory Visit
Admission: RE | Admit: 2018-03-17 | Discharge: 2018-03-17 | Disposition: A | Discharge status: Home / Self Care | Payer: Medicare Other | Source: Ambulatory Visit | Attending: Radiation Oncology | Admitting: Radiation Oncology

## 2018-03-17 VITALS — BP 116/67 | HR 55 | Temp 97.5°F | Resp 18 | Ht 69.0 in | Wt 177.3 lb

## 2018-03-17 DIAGNOSIS — Z79899 Other long term (current) drug therapy: Secondary | ICD-10-CM | POA: Insufficient documentation

## 2018-03-17 DIAGNOSIS — Z7189 Other specified counseling: Secondary | ICD-10-CM

## 2018-03-17 DIAGNOSIS — Z95828 Presence of other vascular implants and grafts: Secondary | ICD-10-CM

## 2018-03-17 DIAGNOSIS — E440 Moderate protein-calorie malnutrition: Secondary | ICD-10-CM | POA: Insufficient documentation

## 2018-03-17 DIAGNOSIS — C8330 Diffuse large B-cell lymphoma, unspecified site: Secondary | ICD-10-CM

## 2018-03-17 DIAGNOSIS — Z9221 Personal history of antineoplastic chemotherapy: Secondary | ICD-10-CM | POA: Insufficient documentation

## 2018-03-17 DIAGNOSIS — C662 Malignant neoplasm of left ureter: Secondary | ICD-10-CM | POA: Diagnosis not present

## 2018-03-17 DIAGNOSIS — C8333 Diffuse large B-cell lymphoma, intra-abdominal lymph nodes: Secondary | ICD-10-CM | POA: Diagnosis not present

## 2018-03-17 DIAGNOSIS — Z905 Acquired absence of kidney: Secondary | ICD-10-CM | POA: Insufficient documentation

## 2018-03-17 DIAGNOSIS — Z85038 Personal history of other malignant neoplasm of large intestine: Secondary | ICD-10-CM | POA: Diagnosis not present

## 2018-03-17 LAB — CBC WITH DIFFERENTIAL/PLATELET
ABS IMMATURE GRANULOCYTES: 0.04 10*3/uL (ref 0.00–0.07)
Basophils Absolute: 0 10*3/uL (ref 0.0–0.1)
Basophils Relative: 0 %
EOS ABS: 0.2 10*3/uL (ref 0.0–0.5)
Eosinophils Relative: 3 %
HCT: 38.8 % — ABNORMAL LOW (ref 39.0–52.0)
Hemoglobin: 12.7 g/dL — ABNORMAL LOW (ref 13.0–17.0)
IMMATURE GRANULOCYTES: 1 %
LYMPHS PCT: 14 %
Lymphs Abs: 1.1 10*3/uL (ref 0.7–4.0)
MCH: 30.2 pg (ref 26.0–34.0)
MCHC: 32.7 g/dL (ref 30.0–36.0)
MCV: 92.4 fL (ref 80.0–100.0)
MONO ABS: 0.8 10*3/uL (ref 0.1–1.0)
Monocytes Relative: 10 %
NRBC: 0 % (ref 0.0–0.2)
Neutro Abs: 5.7 10*3/uL (ref 1.7–7.7)
Neutrophils Relative %: 72 %
PLATELETS: 169 10*3/uL (ref 150–400)
RBC: 4.2 MIL/uL — AB (ref 4.22–5.81)
RDW: 14.8 % (ref 11.5–15.5)
WBC: 7.9 10*3/uL (ref 4.0–10.5)

## 2018-03-17 LAB — LACTATE DEHYDROGENASE: LDH: 90 U/L — AB (ref 98–192)

## 2018-03-17 LAB — CMP (CANCER CENTER ONLY)
ALT: 12 U/L (ref 0–44)
AST: 9 U/L — AB (ref 15–41)
Albumin: 3.7 g/dL (ref 3.5–5.0)
Alkaline Phosphatase: 61 U/L (ref 38–126)
Anion gap: 7 (ref 5–15)
BUN: 24 mg/dL — AB (ref 8–23)
CHLORIDE: 112 mmol/L — AB (ref 98–111)
CO2: 19 mmol/L — AB (ref 22–32)
CREATININE: 2.25 mg/dL — AB (ref 0.61–1.24)
Calcium: 9.3 mg/dL (ref 8.9–10.3)
GFR, EST AFRICAN AMERICAN: 30 mL/min — AB (ref >60)
GFR, Estimated: 26 mL/min — ABNORMAL LOW (ref >60)
GLUCOSE: 82 mg/dL (ref 70–99)
Potassium: 4.3 mmol/L (ref 3.5–5.1)
SODIUM: 138 mmol/L (ref 135–145)
Total Bilirubin: 0.4 mg/dL (ref 0.3–1.2)
Total Protein: 6.8 g/dL (ref 6.5–8.1)

## 2018-03-17 MED ORDER — HEPARIN SOD (PORK) LOCK FLUSH 100 UNIT/ML IV SOLN
250.0000 [IU] | Freq: Once | INTRAVENOUS | Status: AC
  Administered 2018-03-17: 250 [IU]
  Filled 2018-03-17: qty 5

## 2018-03-17 MED ORDER — SODIUM CHLORIDE 0.9% FLUSH
10.0000 mL | Freq: Once | INTRAVENOUS | Status: AC
  Administered 2018-03-17: 10 mL
  Filled 2018-03-17: qty 10

## 2018-03-17 NOTE — Telephone Encounter (Signed)
Scheduled appt per 01/21 los. ° °Printed calendar and avs. °

## 2018-03-18 ENCOUNTER — Ambulatory Visit
Admission: RE | Admit: 2018-03-18 | Discharge: 2018-03-18 | Disposition: A | Discharge status: Home / Self Care | Payer: Medicare Other | Source: Ambulatory Visit | Attending: Radiation Oncology | Admitting: Radiation Oncology

## 2018-03-18 DIAGNOSIS — C8333 Diffuse large B-cell lymphoma, intra-abdominal lymph nodes: Secondary | ICD-10-CM | POA: Diagnosis not present

## 2018-03-19 ENCOUNTER — Ambulatory Visit
Admission: RE | Admit: 2018-03-19 | Discharge: 2018-03-19 | Disposition: A | Discharge status: Home / Self Care | Payer: Medicare Other | Source: Ambulatory Visit | Attending: Radiation Oncology | Admitting: Radiation Oncology

## 2018-03-19 DIAGNOSIS — C8333 Diffuse large B-cell lymphoma, intra-abdominal lymph nodes: Secondary | ICD-10-CM | POA: Diagnosis not present

## 2018-03-27 ENCOUNTER — Encounter: Payer: Self-pay | Admitting: Radiation Oncology

## 2018-03-27 NOTE — Progress Notes (Signed)
  Radiation Oncology         (336) [phone] ________________________________  Name: Jonathan Marshall MRN: [id number]  Date: 03/27/2018  DOB: [birth year]  End of Treatment Note  Diagnosis:   Cancer Staging Diffuse large B-cell lymphoma ([place]) Staging form: Hodgkin and Non-Hodgkin Lymphoma, AJCC 8th Edition - Clinical: Stage I (Diffuse large B-cell lymphoma) - Signed by Eppie Gibson, MD on 01/28/2018      Indication for treatment:  Curative       Radiation treatment dates:   02/24/2018-03/19/2018  Site/dose:   Abdomen, 1.8 Gy x 17 fractions for a total dose of 30.6 Gy  Beams/energy:   IMRT, 15X  Narrative: The patient tolerated radiation treatment relatively well. At the beginning of his treatment, he denied any concerns at the time. On PE, lungs were CTAB, heart RRR, abdomen soft and nontender with normal bowel sounds. Towards the end of his treatment, he noted itching to his abdominal area, mild fatigue. He denied pain or decrease in appetite. He was given Sonafine for use BID  instructed on its use. On PE the patient was NAD with no skin changes.   Plan: The patient has completed radiation treatment. The patient will return to radiation oncology clinic for routine followup in one month. I advised them to call or return sooner if they have any questions or concerns related to their recovery or treatment.  -----------------------------------  Eppie Gibson, MD  This document serves as a record of services personally performed by Eppie Gibson, MD. It was created on her behalf by Steva Colder, a trained medical scribe. The creation of this record is based on the scribe's personal observations and the provider's statements to them. This document has been checked and approved by the attending provider.

## 2018-04-15 ENCOUNTER — Encounter: Payer: Self-pay | Admitting: Radiation Oncology

## 2018-04-15 NOTE — Progress Notes (Signed)
Jonathan Marshall presents for follow up of radiation completed 03/19/18 to his abdomen. He last Dr. Irene Limbo on 03/17/18 and is scheduled to see him again on 05/18/18. He denies pain. He reports itching to lower right back where he had previous shingles. He uses sonafine to this area as needed. He is eating and drinking well.   BP 119/67 (BP Location: Left Arm)   Pulse (!) 50   Temp (!) 97.4 F (36.3 C) (Oral)   Ht 5\' 9"  (1.753 m)   Wt 172 lb 12.8 oz (78.4 kg)   SpO2 100% Comment: room air  BMI 25.52 kg/m    Wt Readings from Last 3 Encounters:  04/21/18 172 lb 12.8 oz (78.4 kg)  03/17/18 177 lb 4.8 oz (80.4 kg)  01/28/18 180 lb 9.6 oz (81.9 kg)  He tells me that he is trying to maintain his weight about where it is, by being "selective" when eating.

## 2018-04-21 ENCOUNTER — Encounter: Payer: Self-pay | Admitting: Radiation Oncology

## 2018-04-21 ENCOUNTER — Ambulatory Visit
Admission: RE | Admit: 2018-04-21 | Discharge: 2018-04-21 | Disposition: A | Discharge status: Home / Self Care | Payer: Medicare Other | Source: Ambulatory Visit | Attending: Radiation Oncology | Admitting: Radiation Oncology

## 2018-04-21 ENCOUNTER — Other Ambulatory Visit: Payer: Self-pay

## 2018-04-21 DIAGNOSIS — C8330 Diffuse large B-cell lymphoma, unspecified site: Secondary | ICD-10-CM | POA: Diagnosis not present

## 2018-04-21 DIAGNOSIS — Z79899 Other long term (current) drug therapy: Secondary | ICD-10-CM | POA: Insufficient documentation

## 2018-04-21 DIAGNOSIS — Z510 Encounter for antineoplastic radiation therapy: Secondary | ICD-10-CM | POA: Insufficient documentation

## 2018-04-21 DIAGNOSIS — C8333 Diffuse large B-cell lymphoma, intra-abdominal lymph nodes: Secondary | ICD-10-CM

## 2018-04-21 HISTORY — DX: Personal history of irradiation: Z92.3

## 2018-04-21 NOTE — Progress Notes (Signed)
  Radiation Oncology         (336) [phone] ________________________________  Name: Jonathan Marshall MRN: [id number]  Date: 04/21/2018  DOB: 1933/02/07  Follow-Up Visit Note  Outpatient  CC: Lorene Dy, MD  Brunetta Genera, MD  Diagnosis and Prior Radiotherapy:    ICD-10-CM   1. Diffuse large B-cell lymphoma of intra-abdominal lymph nodes ([place]) C83.33     02/24/2018 - 03/19/2018: Abdomen / 30.6 Gy in 17 fractions of 1.8 Gy  CHIEF COMPLAINT: Here for follow-up and surveillance of lymphoma  Narrative:  The patient returns today for routine follow-up since completing radiation therapy to the retroperitoneal periaortic mass. He is accompanied by his wife today. He last saw Dr. Irene Limbo on 03/17/2018 and Dr. Tresa Moore on 03/31/2018.  On review of systems, he denies any pain and issues eating or drinking.                               ALLERGIES:  has No Known Allergies.  Meds: Current Outpatient Medications  Medication Sig Dispense Refill  . fluticasone (FLONASE) 50 MCG/ACT nasal spray     . latanoprost (XALATAN) 0.005 % ophthalmic solution Place 1 drop into both eyes at bedtime.    . lovastatin (MEVACOR) 40 MG tablet Take 40 mg by mouth every evening.    . sertraline (ZOLOFT) 25 MG tablet Take 25 mg by mouth every morning.     . timolol (TIMOPTIC) 0.5 % ophthalmic solution      No current facility-administered medications for this encounter.     Physical Findings: The patient is in no acute distress. Patient is alert and oriented.  height is 5\' 9"  (1.753 m) and weight is 172 lb 12.8 oz (78.4 kg). His oral temperature is 97.4 F (36.3 C) (abnormal). His blood pressure is 119/67 and his pulse is 50 (abnormal). His oxygen saturation is 100%. .   General: Alert and oriented, in no acute distress Skin:  No skin changes over his abdomen or back from the radiation. Psychiatric: Judgment and insight are intact. Affect is appropriate.   Lab Findings: Lab Results  Component Value Date   WBC 7.9 03/17/2018   HGB 12.7 (L) 03/17/2018   HCT 38.8 (L) 03/17/2018   MCV 92.4 03/17/2018   PLT 169 03/17/2018    Radiographic Findings: No results found.  Impression/Plan:  Diffuse Large B-Cell Lymphoma - recovering well.  Patient will continue to follow up with Dr. Irene Limbo. We will see him as needed in radiation oncology.  I spent 15 minutes face to face with the patient and more than 50% of that time was spent in counseling and/or coordination of care. _____________________________________   Eppie Gibson, MD  This document serves as a record of services personally performed by Eppie Gibson, MD. It was created on her behalf by Wilburn Mylar, a trained medical scribe. The creation of this record is based on the scribe's personal observations and the provider's statements to them. This document has been checked and approved by the attending provider.

## 2018-05-15 NOTE — Progress Notes (Signed)
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 male who has been referred to [place] by urologist Dr Alexis Frock for evaluation and management of newly diagnosed diffuse large B Cell Lymphoma. He is accompanied today by his wife. The pt reports that he is doing well overall.   The pt reports that in 1993 he had colon cancer with two tumors in the ascending and descending colon, which was picked up from a routine physical. He notes that he was treated with chemotherapy with Levamisole and Leukovorin for 6 months. He has not had any issues with the colon cancer since. He continue to see Dr. Cristina Gong for his GI.   He notes that he has had blood in the urine for a year and a half, picked up in a physical. This prompted his TURP surgery in July 2018. He notes that the bleeding persisted after the surgery. He notes that a stent was placed which has significantly reduced his hematuria. In February 2019 his left uretral mass was identified and subsequently biopsied confirming adenocarcinoma. The pt notes that Dr. Tresa Moore has up to this point been considering removing one of his kidneys, which would have a bearing on our treatment.   A 04/21/17 CT Abdomen identified a right retroperitoneal mass which was biopsied on 05/08/17 which revealed Large B-Cell Lymphoma.   He notes no ongoing health problems and is without physical limitations. He denies having had any blood transfusions.   On 04/23/17 the pt had a Uretral biopsy revealing Ureter, biopsy, Left mass- SUPERFICIAL FRAGMENTS OF UROTHELIAL CARCINOMA ASSOCIATED WITH GRANULATION TISSUE AND NECROSIS.   CT Abdomen of 04/21/17 revealed large right para-aortic retroperitoneal nodal  conglomeration in the abdomen, just inferior to the right renal artery. Imaging features compatible with metastatic disease in this patient with a history of prostate and left urothelial cancer. 2. Aortic atherosclerosis.   Soft Tissue Needle/core biopsy completed on 05/08/17 with results revealing Large B-Cell Lymphoma.   Most recent lab results (05/08/17) of CBC  is as follows: all values are WNL except for WBC at 11.3k, Monocytes Abs at 1.1k, CO2 at 19, Glucose at 109, Creatinine at 1.54, ALT at 16.  On review of systems, pt reports infrequent hematuria, mild back pain associated with golfing, and denies leg swelling, flank pain, fevers, chills, night sweats and any other symptoms.   On PMHx the pt reports Colon cancer in 1993, hypercholesterolemia, glaucoma, low risk prostate cancer, mastoiditis, hernia repair, carpel tunnel. He denies heart or lung problems.  On Social Hx the pt denies smoking and ETOH consumption. On Family Hx the pt reports that his mother had breast cancer when she was roughly 62, and also cervical cancer. On his mother's side, his aunts and uncles had several cancers including lung. Maternal grandfather had prostate cancer.   Interval History:  Jonathan Marshall returns today for management and evaluation of his Diffuse Large B-Cell Lymphoma and Urothelial carcinoma. [name] was on 03/17/18. The pt reports that he is doing well overall.   The pt reports that he has been doing very well since finishing radiation about a month ago with Dr. Isidore Moos. The pt also had a recent intravesical BCG injection with Dr.  Manny in Urology.   The pt notes that he is eating well and is currently at his ideal body weight. He notes that he is having slowly improving energy levels and continues staying active with golf.  Lab results today (05/18/18) of CBC w/diff and CMP is as follows: all values are WNL except for RBC at 4.06, HGB at 12.6, HCT at 38.9, Chloride at 112, CO2  at 20, Glucose at 111, Creatinine at 2.02, Calcium at 8.8, GFR at 30. 05/18/18 LDH is . Lab Results  Component Value Date   LDH 167 05/18/2018   On review of systems, pt reports eating well, staying active, stable weight, and denies abdominal pains, leg swelling, concerns for infections, pain along the spine, flank pain, and any other symptoms.    MEDICAL HISTORY:  Past Medical History:  Diagnosis Date  . BPH (benign prostatic hyperplasia)   . Cancer [facility]) 1993   Colon surgery and chemo , 2-19 left ureteral camcer last chemo tx 08-27-17  . DDD (degenerative disc disease), lumbar    L4-L5  . Depression   . Early stage glaucoma    both eyes  . Hearing loss   . Hematuria, gross   . History of adenomatous polyp of colon   . History of carpal tunnel syndrome    left  . History of colon cancer    dx 1993  s/p  hemicolectomy (malignant tumor x2)  and chemo therapy completed 1993--- no recurrence per pt  . History of radiation therapy 02/24/18- 03/19/18   Abdomen, 1.8 Gy in 17 fractions for a total dose of 30.6 Gy.   [name] Kitchen History of shingles   . HOH (hard of hearing)    right ear  . Hyperlipidemia   . Insomnia     SURGICAL HISTORY: Past Surgical History:  Procedure Laterality Date  . APPENDECTOMY    . CARPAL TUNNEL RELEASE Left 08/13/2013   Procedure: CARPAL TUNNEL RELEASE LEFT WRIST;  Surgeon: Yvette Rack., MD;  Location: [facility];  Service: Orthopedics;  Laterality: Left;  . COLONOSCOPY    . CYSTOSCOPY W/ RETROGRADES Bilateral 04/03/2017   Procedure: CYSTOSCOPY WITH RETROGRADE PYELOGRAM, LEFT URETEROSCOPY, LEFT STENT PLACEMENT;  Surgeon: Franchot Gallo, MD;  Location: [facility];  Service: Urology;  Laterality: Bilateral;  . CYSTOSCOPY W/ RETROGRADES Bilateral 12/12/2017   Procedure: CYSTOSCOPY WITH RETROGRADE PYELOGRAM;  Surgeon: Alexis Frock, MD;  Location: [facility];  Service: Urology;  Laterality: Bilateral;  . CYSTOSCOPY  W/ URETERAL STENT PLACEMENT Left 04/23/2017   Procedure: CYSTOSCOPY WITH STENT REPLACEMENT;  Surgeon: Alexis Frock, MD;  Location[facility];  Service: Urology;  Laterality: Left;  . CYSTOSCOPY WITH FULGERATION Left 04/03/2017   Procedure: LEFT URETERAL BIOPSY;  Surgeon: Franchot Gallo, MD;  Location: [facility];  Service: Urology;  Laterality: Left;  . CYSTOSCOPY/RETROGRADE/URETEROSCOPY Left 04/23/2017   Procedure: CYSTOSCOPY/RETROGRADE/URETEROSCOPY, LEFT  URETEROSCOPY WITY  BIOPSY;  Surgeon: Alexis Frock, MD;  Location: [facility];  Service: Urology;  Laterality: Left;  . HEMICOLECTOMY  1993   malignant tumor x2 , colon cancer and Appendectomy  . INGUINAL HERNIA REPAIR Right 2003  . IR FLUORO GUIDE PORT INSERTION RIGHT  05/30/2017  . IR US GUIDE VASC ACCESS RIGHT  05/30/2017  . MASTOIDECTOMY Right 2015  . ROBOT ASSITED LAPAROSCOPIC NEPHROURETERECTOMY Left 09/24/2017   Procedure: XI ROBOT ASSITED LAPAROSCOPIC NEPHROURETERECTOMY;  Surgeon: Alexis Frock, MD;  Location: [facility];  Service: Urology;  Laterality: Left;  .  ROBOTIC ASSISTED LAPAROSCOPIC LYSIS OF ADHESION  09/24/2017   Procedure: XI ROBOTIC ASSISTED LAPAROSCOPIC EXTENSIVE LYSIS OF ADHESION;  Surgeon: Alexis Frock, MD;  Location: [facility];  Service: Urology;;  . Lonell Face  last one 06/ 2018  . TRANSURETHRAL RESECTION OF BLADDER TUMOR N/A 12/12/2017   Procedure: TRANSURETHRAL RESECTION OF BLADDER TUMOR (TURBT);  Surgeon: Alexis Frock, MD;  Location: [facility];  Service: Urology;  Laterality: N/A;  . TRANSURETHRAL RESECTION OF PROSTATE N/A 09/02/2016   Procedure: TRANSURETHRAL RESECTION OF THE PROSTATE (TURP);  Surgeon: Franchot Gallo, MD;  Location: [facility];  Service: Urology;  Laterality: N/A;    SOCIAL HISTORY: Social History   Socioeconomic History  . Marital status: Married    Spouse name: Not on file  . Number of children: Not on  file  . Years of education: Not on file  . Highest education level: Not on file  Occupational History  . Not on file  Social Needs  . Financial resource strain: Not on file  . Food insecurity:    Worry: Not on file    Inability: Not on file  . Transportation needs:    Medical: No    Non-medical: No  Tobacco Use  . Smoking status: Never Smoker  . Smokeless tobacco: Never Used  Substance and Sexual Activity  . Alcohol use: Yes    Alcohol/week: 1.0 standard drinks    Types: 1 Glasses of wine per week    Comment: rare  . Drug use: Never  . Sexual activity: Not Currently  Lifestyle  . Physical activity:    Days per week: Not on file    Minutes per session: Not on file  . Stress: Not on file  Relationships  . Social connections:    Talks on phone: Not on file    Gets together: Not on file    Attends religious service: Not on file    Active member of club or organization: Not on file    Attends meetings of clubs or organizations: Not on file    Relationship status: Not on file  . Intimate partner violence:    Fear of current or ex partner: Not on file    Emotionally abused: Not on file    Physically abused: Not on file    Forced sexual activity: Not on file  Other Topics Concern  . Not on file  Social History Narrative  . Not on file    FAMILY HISTORY: No family history on file.  ALLERGIES:  has No Known Allergies.  MEDICATIONS:  Current Outpatient Medications  Medication Sig Dispense Refill  . fluticasone (FLONASE) 50 MCG/ACT nasal spray     . latanoprost (XALATAN) 0.005 % ophthalmic solution Place 1 drop into both eyes at bedtime.    . lovastatin (MEVACOR) 40 MG tablet Take 40 mg by mouth every evening.    . sertraline (ZOLOFT) 25 MG tablet Take 25 mg by mouth every morning.     . timolol (TIMOPTIC) 0.5 % ophthalmic solution      No current facility-administered medications for this visit.     REVIEW OF SYSTEMS:    A 10+ POINT REVIEW OF SYSTEMS WAS  OBTAINED including neurology, dermatology, psychiatry, cardiac, respiratory, lymph, extremities, GI, GU, Musculoskeletal, constitutional, breasts, reproductive, HEENT.  All pertinent positives are noted in the HPI.  All others are negative.    PHYSICAL EXAMINATION: ECOG PERFORMANCE STATUS: 1 - Symptomatic but completely ambulatory  Vitals:   05/18/18 1354  BP:  130/67  Pulse: (!) 51  Resp: 18  Temp: 97.7 F (36.5 C)  SpO2: 100%   Filed Weights   05/18/18 1354  Weight: 175 lb 11.2 oz (79.7 kg)   .Body mass index is 25.95 kg/m.  GENERAL:alert, in no acute distress and comfortable SKIN: no acute rashes, no significant lesions EYES: conjunctiva are pink and non-injected, sclera anicteric OROPHARYNX: MMM, no exudates, no oropharyngeal erythema or ulceration NECK: supple, no JVD LYMPH:  no palpable lymphadenopathy in the cervical, axillary or inguinal regions LUNGS: clear to auscultation b/l with normal respiratory effort HEART: regular rate & rhythm ABDOMEN:  normoactive bowel sounds , non tender, not distended. No palpable hepatosplenomegaly.  Extremity: no pedal edema PSYCH: alert & oriented x 3 with fluent speech NEURO: no focal motor/sensory deficits   LABORATORY DATA:  I have reviewed the data as listed  . CBC Latest Ref Rng & Units 05/18/2018 03/17/2018 01/14/2018  WBC 4.0 - 10.5 K/uL 7.9 7.9 9.2  Hemoglobin 13.0 - 17.0 g/dL 12.6(L) 12.7(L) 11.9(L)  Hematocrit 39.0 - 52.0 % 38.9(L) 38.8(L) 37.6(L)  Platelets 150 - 400 K/uL 190 169 217    . CMP Latest Ref Rng & Units 05/18/2018 03/17/2018 01/14/2018  Glucose 70 - 99 mg/dL 111(H) 82 78  BUN 8 - 23 mg/dL 23 24(H) 23  Creatinine 0.61 - 1.24 mg/dL 2.02(H) 2.25(H) 2.20(H)  Sodium 135 - 145 mmol/L 142 138 141  Potassium 3.5 - 5.1 mmol/L 4.4 4.3 4.3  Chloride 98 - 111 mmol/L 112(H) 112(H) 114(H)  CO2 22 - 32 mmol/L 20(L) 19(L) 19(L)  Calcium 8.9 - 10.3 mg/dL 8.8(L) 9.3 9.8  Total Protein 6.5 - 8.1 g/dL 6.5 6.8 7.0  Total  Bilirubin 0.3 - 1.2 mg/dL 0.5 0.4 0.5  Alkaline Phos 38 - 126 U/L 64 61 61  AST 15 - 41 U/L 24 9(L) 15  ALT 0 - 44 U/L 34 12 18    Component     Latest Ref Rng & Units 05/15/2017  Retic Ct Pct     0.8 - 1.8 % 1.6  RBC.     4.20 - 5.82 MIL/uL 4.62  Retic Count, Absolute     34.8 - 93.9 K/uL 73.9  Prothrombin Time     11.4 - 15.2 seconds 14.5  INR      1.13  APTT     24 - 36 seconds 31  HIV Screen 4th Generation wRfx     Non Reactive Non Reactive  HCV Ab     0.0 - 0.9 s/co ratio <0.1  Hep B Core Ab, Tot     Negative Negative  Hepatitis B Surface Ag     Negative Negative  LDH     125 - 245 U/L 132   . Lab Results  Component Value Date   LDH 167 05/18/2018   12/12/17 Biopsy:    05/08/17 Soft Tissue Needle Core Biopsy:   04/23/17 Ureter Biopsy:   RADIOGRAPHIC STUDIES: I have personally reviewed the radiological images as listed and agreed with the findings in the report. No results found. 04/21/17 CT Abdomen   ASSESSMENT & PLAN:   85 y.o. male with  1. Diffuse Large B Cell Lymphoma - Stage II -bulky  05/26/17 PET/CT which showed 1. 5 cm right-sided retroperitoneal nodal mass is hypermetabolic and consistent with known lymphoma. Small lymph node just below the aortic bifurcation is also hypermetabolic. -ECHO shows normal EF.  S/p 4 cycles of R-mini CHOP  07/28/17 PET/CT which revealed Significant response  to interval therapy. The large retroperitoneal mass is significantly smaller with minimal residual activity, slightly greater than blood pool (Deauville 3). No evidence of disease progression.  12/26/17 PET/CT revealed No evidence of lymphoma recurrence on FDG PET scan. 2. Continued decrease in size and metabolic activity retroperitoneal periaortic mass. (Deauville 2) 3. LEFT nephrectomy.  S/p 30.6 Gy in 17 fractions completed on 03/19/18 with Dr. Isidore Moos  2. Urothelial Cell Carcinoma -04/23/17 Biopsy of the Left Ureter Mass revealed a transitional cell carcinoma  of the ureter.  -s/p left nephroureterectomy pm 09/24/2017 -continue urologic f/u with Dr Tresa Moore.  3. S/p post operative Acute UTI with E.coli with sepsis and dehydration 4.s/p  AKI on CKD- single kidney. - likely due to poor po intake- creatinine improved with IVF 5. Protein calorie malnutrition-moderate 6. 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 S/p TURB tumor on 12/12/17 with Dr. Tresa Moore in urology.  Pt is currently pursuing local instillation of BCG with urology   PLAN -Discussed pt labwork today, 05/18/18; blood counts and chemistries are stable, LDH is pending. -The pt shows no clinical or lab progression of his DLBCL at this time.  -No indication for further treatment of his DLBCL at this time -Will refer the pt to IR for port removal in 6-8 weeks -Avoid NSAIDs, infections and dehydration to protect kidneys. Tylenol if necessary  -Marinol 5mg  BID to boost appetite -Recommended that the pt continue to eat well, drink at least 48-64 oz of water each day, and walk 20-30 minutes each day.  -Will see the pt back in 3 months   RTC with Dr Irene Limbo with labs in 3 months    All of the patients questions were answered with apparent satisfaction. The patient knows to call the clinic with any problems, questions or concerns.  The total time spent in the appt was 25 minutes and more than 50% was on counseling and direct patient cares.   Sullivan Lone MD MS AAHIVMS [facility] Hematology/Oncology Physician [facility]  (Office):       [phone] (Work cell):  [phone] (Fax):           [phone]  05/18/2018 2:36 PM  I, Baldwin Jamaica, am acting as a scribe for Dr. Sullivan Lone.   .I have reviewed the above documentation for accuracy and completeness, and I agree with the above. Brunetta Genera MD

## 2018-05-18 ENCOUNTER — Inpatient Hospital Stay: Payer: Medicare Other

## 2018-05-18 ENCOUNTER — Inpatient Hospital Stay: Payer: Medicare Other | Attending: Hematology

## 2018-05-18 ENCOUNTER — Inpatient Hospital Stay (HOSPITAL_BASED_OUTPATIENT_CLINIC_OR_DEPARTMENT_OTHER): Payer: Medicare Other | Admitting: Hematology

## 2018-05-18 ENCOUNTER — Telehealth: Payer: Self-pay | Admitting: Hematology

## 2018-05-18 ENCOUNTER — Other Ambulatory Visit: Payer: Self-pay

## 2018-05-18 VITALS — BP 130/67 | HR 51 | Temp 97.7°F | Resp 18 | Ht 69.0 in | Wt 175.7 lb

## 2018-05-18 DIAGNOSIS — C662 Malignant neoplasm of left ureter: Secondary | ICD-10-CM | POA: Insufficient documentation

## 2018-05-18 DIAGNOSIS — C8330 Diffuse large B-cell lymphoma, unspecified site: Secondary | ICD-10-CM

## 2018-05-18 DIAGNOSIS — Z9221 Personal history of antineoplastic chemotherapy: Secondary | ICD-10-CM | POA: Diagnosis not present

## 2018-05-18 DIAGNOSIS — Z906 Acquired absence of other parts of urinary tract: Secondary | ICD-10-CM

## 2018-05-18 DIAGNOSIS — C8338 Diffuse large B-cell lymphoma, lymph nodes of multiple sites: Secondary | ICD-10-CM

## 2018-05-18 DIAGNOSIS — Z6825 Body mass index (BMI) 25.0-25.9, adult: Secondary | ICD-10-CM

## 2018-05-18 DIAGNOSIS — E440 Moderate protein-calorie malnutrition: Secondary | ICD-10-CM

## 2018-05-18 DIAGNOSIS — Z7189 Other specified counseling: Secondary | ICD-10-CM

## 2018-05-18 DIAGNOSIS — Z905 Acquired absence of kidney: Secondary | ICD-10-CM | POA: Diagnosis not present

## 2018-05-18 DIAGNOSIS — Z923 Personal history of irradiation: Secondary | ICD-10-CM

## 2018-05-18 DIAGNOSIS — Z95828 Presence of other vascular implants and grafts: Secondary | ICD-10-CM

## 2018-05-18 DIAGNOSIS — C8333 Diffuse large B-cell lymphoma, intra-abdominal lymph nodes: Secondary | ICD-10-CM

## 2018-05-18 LAB — CBC WITH DIFFERENTIAL/PLATELET
ABS IMMATURE GRANULOCYTES: 0.03 10*3/uL (ref 0.00–0.07)
BASOS ABS: 0 10*3/uL (ref 0.0–0.1)
BASOS PCT: 0 %
EOS ABS: 0.2 10*3/uL (ref 0.0–0.5)
Eosinophils Relative: 2 %
HCT: 38.9 % — ABNORMAL LOW (ref 39.0–52.0)
Hemoglobin: 12.6 g/dL — ABNORMAL LOW (ref 13.0–17.0)
IMMATURE GRANULOCYTES: 0 %
Lymphocytes Relative: 19 %
Lymphs Abs: 1.5 10*3/uL (ref 0.7–4.0)
MCH: 31 pg (ref 26.0–34.0)
MCHC: 32.4 g/dL (ref 30.0–36.0)
MCV: 95.8 fL (ref 80.0–100.0)
Monocytes Absolute: 0.8 10*3/uL (ref 0.1–1.0)
Monocytes Relative: 10 %
NEUTROS ABS: 5.4 10*3/uL (ref 1.7–7.7)
NEUTROS PCT: 69 %
NRBC: 0 % (ref 0.0–0.2)
PLATELETS: 190 10*3/uL (ref 150–400)
RBC: 4.06 MIL/uL — AB (ref 4.22–5.81)
RDW: 14.7 % (ref 11.5–15.5)
WBC: 7.9 10*3/uL (ref 4.0–10.5)

## 2018-05-18 LAB — LACTATE DEHYDROGENASE: LDH: 167 U/L (ref 98–192)

## 2018-05-18 LAB — CMP (CANCER CENTER ONLY)
ALBUMIN: 3.5 g/dL (ref 3.5–5.0)
ALK PHOS: 64 U/L (ref 38–126)
ALT: 34 U/L (ref 0–44)
AST: 24 U/L (ref 15–41)
Anion gap: 10 (ref 5–15)
BILIRUBIN TOTAL: 0.5 mg/dL (ref 0.3–1.2)
BUN: 23 mg/dL (ref 8–23)
CALCIUM: 8.8 mg/dL — AB (ref 8.9–10.3)
CO2: 20 mmol/L — ABNORMAL LOW (ref 22–32)
Chloride: 112 mmol/L — ABNORMAL HIGH (ref 98–111)
Creatinine: 2.02 mg/dL — ABNORMAL HIGH (ref 0.61–1.24)
GFR, EST AFRICAN AMERICAN: 34 mL/min — AB (ref >60)
GFR, Estimated: 30 mL/min — ABNORMAL LOW (ref >60)
Glucose, Bld: 111 mg/dL — ABNORMAL HIGH (ref 70–99)
POTASSIUM: 4.4 mmol/L (ref 3.5–5.1)
Sodium: 142 mmol/L (ref 135–145)
TOTAL PROTEIN: 6.5 g/dL (ref 6.5–8.1)

## 2018-05-18 MED ORDER — HEPARIN SOD (PORK) LOCK FLUSH 100 UNIT/ML IV SOLN
500.0000 [IU] | Freq: Once | INTRAVENOUS | Status: AC
  Administered 2018-05-18: 500 [IU]
  Filled 2018-05-18: qty 5

## 2018-05-18 MED ORDER — SODIUM CHLORIDE 0.9% FLUSH
10.0000 mL | Freq: Once | INTRAVENOUS | Status: AC
  Administered 2018-05-18: 10 mL
  Filled 2018-05-18: qty 10

## 2018-05-18 NOTE — Telephone Encounter (Signed)
Gave avs and calendar ° °

## 2018-07-03 ENCOUNTER — Other Ambulatory Visit: Payer: Self-pay | Admitting: Student

## 2018-07-06 ENCOUNTER — Ambulatory Visit (HOSPITAL_COMMUNITY)
Admission: RE | Admit: 2018-07-06 | Discharge: 2018-07-06 | Disposition: A | Discharge status: Home / Self Care | Payer: Medicare Other | Source: Ambulatory Visit | Attending: Hematology | Admitting: Hematology

## 2018-07-06 ENCOUNTER — Other Ambulatory Visit: Payer: Self-pay

## 2018-07-06 ENCOUNTER — Encounter (HOSPITAL_COMMUNITY): Payer: Self-pay

## 2018-07-06 DIAGNOSIS — Z452 Encounter for adjustment and management of vascular access device: Secondary | ICD-10-CM | POA: Diagnosis not present

## 2018-07-06 DIAGNOSIS — C8330 Diffuse large B-cell lymphoma, unspecified site: Secondary | ICD-10-CM | POA: Diagnosis not present

## 2018-07-06 HISTORY — PX: IR REMOVAL TUN ACCESS W/ PORT W/O FL MOD SED: IMG2290

## 2018-07-06 LAB — CBC
HCT: 38.5 % — ABNORMAL LOW (ref 39.0–52.0)
Hemoglobin: 12 g/dL — ABNORMAL LOW (ref 13.0–17.0)
MCH: 31.1 pg (ref 26.0–34.0)
MCHC: 31.2 g/dL (ref 30.0–36.0)
MCV: 99.7 fL (ref 80.0–100.0)
Platelets: 189 10*3/uL (ref 150–400)
RBC: 3.86 MIL/uL — ABNORMAL LOW (ref 4.22–5.81)
RDW: 14.1 % (ref 11.5–15.5)
WBC: 7.6 10*3/uL (ref 4.0–10.5)
nRBC: 0 % (ref 0.0–0.2)

## 2018-07-06 LAB — PROTIME-INR
INR: 1.3 — ABNORMAL HIGH (ref 0.8–1.2)
Prothrombin Time: 16.3 seconds — ABNORMAL HIGH (ref 11.4–15.2)

## 2018-07-06 LAB — APTT: aPTT: 34 seconds (ref 24–36)

## 2018-07-06 MED ORDER — SODIUM CHLORIDE 0.9 % IV SOLN
INTRAVENOUS | Status: DC
  Administered 2018-07-06: 12:00:00 via INTRAVENOUS

## 2018-07-06 MED ORDER — LIDOCAINE-EPINEPHRINE 1 %-1:100000 IJ SOLN
INTRAMUSCULAR | Status: AC
  Filled 2018-07-06: qty 1

## 2018-07-06 MED ORDER — LIDOCAINE HCL 1 % IJ SOLN
INTRAMUSCULAR | Status: DC | PRN
  Administered 2018-07-06: 5 mL

## 2018-07-06 MED ORDER — CEFAZOLIN SODIUM-DEXTROSE 2-4 GM/100ML-% IV SOLN
INTRAVENOUS | Status: AC
  Administered 2018-07-06: 13:00:00
  Filled 2018-07-06: qty 100

## 2018-07-06 NOTE — Progress Notes (Signed)
Patient ID: Jonathan Marshall, male   DOB: March 09, 1932, 85 y.o.   MRN: [id number] Patient presents to IR department today for Port-A-Cath removal.  He has completed treatment for lymphoma.  He requests no IV conscious sedation.  Details/risks of procedure, including but not limited to, internal bleeding, infection, injury to adjacent structures discussed with patient with his understanding and consent.

## 2018-07-06 NOTE — Discharge Instructions (Addendum)
You may shower and remove your dressing tomorrow.  Implanted Port Removal, Care After This sheet gives you information about how to care for yourself after your procedure. Your health care provider may also give you more specific instructions. If you have problems or questions, contact your health care provider. What can I expect after the procedure? After the procedure, it is common to have:  Soreness or pain near your incision.  Some swelling or bruising near your incision. Follow these instructions at home: Medicines  Take over-the-counter and prescription medicines only as told by your health care provider.  If you were prescribed an antibiotic medicine, take it as told by your health care provider. Do not stop taking the antibiotic even if you start to feel better. Bathing  Do not take baths, swim, or use a hot tub until your health care provider approves. Ask your health care provider if you can take showers. You may only be allowed to take sponge baths. Incision care   Follow instructions from your health care provider about how to take care of your incision. Make sure you: ? Wash your hands with soap and water before you change your bandage (dressing). If soap and water are not available, use hand sanitizer. ? Change your dressing as told by your health care provider. ? Keep your dressing dry. ? Leave stitches (sutures), skin glue, or adhesive strips in place. These skin closures may need to stay in place for 2 weeks or longer. If adhesive strip edges start to loosen and curl up, you may trim the loose edges. Do not remove adhesive strips completely unless your health care provider tells you to do that.  Check your incision area every day for signs of infection. Check for: ? More redness, swelling, or pain. ? More fluid or blood. ? Warmth. ? Pus or a bad smell. Driving   Do not drive for 24 hours if you were given a medicine to help you relax (sedative) during your  procedure.  If you did not receive a sedative, ask your health care provider when it is safe to drive. Activity  Return to your normal activities as told by your health care provider. Ask your health care provider what activities are safe for you.  Do not lift anything that is heavier than 10 lb (4.5 kg), or the limit that you are told, until your health care provider says that it is safe.  Do not do activities that involve lifting your arms over your head. General instructions  Do not use any products that contain nicotine or tobacco, such as cigarettes and e-cigarettes. These can delay healing. If you need help quitting, ask your health care provider.  Keep all follow-up visits as told by your health care provider. This is important. Contact a health care provider if:  You have more redness, swelling, or pain around your incision.  You have more fluid or blood coming from your incision.  Your incision feels warm to the touch.  You have pus or a bad smell coming from your incision.  You have pain that is not relieved by your pain medicine. Get help right away if you have:  A fever or chills.  Chest pain.  Difficulty breathing. Summary  After the procedure, it is common to have pain, soreness, swelling, or bruising near your incision.  If you were prescribed an antibiotic medicine, take it as told by your health care provider. Do not stop taking the antibiotic even if you start to  feel better.  Do not drive for 24 hours if you were given a sedative during your procedure.  Return to your normal activities as told by your health care provider. Ask your health care provider what activities are safe for you. This information is not intended to replace advice given to you by your health care provider. Make sure you discuss any questions you have with your health care provider. Document Released: 01/23/2015 Document Revised: 03/27/2017 Document Reviewed: 03/27/2017 Elsevier  Interactive Patient Education  2019 [employer].

## 2018-07-06 NOTE — Procedures (Signed)
Interventional Radiology Procedure:   Indications: Completed therapy and port is no longer needed.  Procedure: Port removal  Findings: Complete removal of port and retention sutures  Complications: None     EBL: Less than 10 ml  Plan: Discharge to home.    [name], MD  Pager: [phone]

## 2018-07-27 HISTORY — PX: CATARACT EXTRACTION W/ INTRAOCULAR LENS  IMPLANT, BILATERAL: SHX1307

## 2018-08-17 ENCOUNTER — Telehealth: Payer: Self-pay

## 2018-08-17 NOTE — Telephone Encounter (Signed)
Called and left voicemail regarding pre-screening questions for appt on 6/23  

## 2018-08-18 ENCOUNTER — Telehealth: Payer: Self-pay | Admitting: *Deleted

## 2018-08-18 ENCOUNTER — Inpatient Hospital Stay: Payer: Medicare Other

## 2018-08-18 ENCOUNTER — Inpatient Hospital Stay: Payer: Medicare Other | Admitting: Hematology

## 2018-08-18 NOTE — Telephone Encounter (Signed)
Attempted to contact patient regarding missed appt on for lab and appt w/Dr.Kale today 6/23. No answer. Sent scheduling msg to r/s patient at earliest convenience

## 2018-08-19 ENCOUNTER — Telehealth: Payer: Self-pay | Admitting: Hematology

## 2018-08-19 NOTE — Telephone Encounter (Signed)
R/s appt per 6/23 sch message - pt is aware of appt - did not want to wait 3  Months want to r/s 65months

## 2018-10-07 ENCOUNTER — Other Ambulatory Visit: Payer: Self-pay | Admitting: Urology

## 2018-10-17 ENCOUNTER — Other Ambulatory Visit (HOSPITAL_COMMUNITY)
Admission: RE | Admit: 2018-10-17 | Discharge: 2018-10-17 | Disposition: A | Discharge status: Home / Self Care | Payer: Medicare Other | Source: Ambulatory Visit | Attending: Urology | Admitting: Urology

## 2018-10-17 DIAGNOSIS — Z01812 Encounter for preprocedural laboratory examination: Secondary | ICD-10-CM | POA: Insufficient documentation

## 2018-10-17 DIAGNOSIS — Z20828 Contact with and (suspected) exposure to other viral communicable diseases: Secondary | ICD-10-CM | POA: Insufficient documentation

## 2018-10-18 LAB — SARS CORONAVIRUS 2 (TAT 6-24 HRS): SARS Coronavirus 2: NEGATIVE

## 2018-10-20 ENCOUNTER — Other Ambulatory Visit: Payer: Self-pay

## 2018-10-20 ENCOUNTER — Encounter (HOSPITAL_BASED_OUTPATIENT_CLINIC_OR_DEPARTMENT_OTHER): Payer: Self-pay | Admitting: *Deleted

## 2018-10-20 MED ORDER — GENTAMICIN SULFATE 40 MG/ML IJ SOLN
400.0000 mg | INTRAVENOUS | Status: DC
  Filled 2018-10-20: qty 10

## 2018-10-20 NOTE — Progress Notes (Signed)
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 male who has been referred to [place] by urologist Dr Alexis Frock for evaluation and management of newly diagnosed diffuse large B Cell Lymphoma. He is accompanied today by his wife. The pt reports that he is doing well overall.   The pt reports that in 1993 he had colon cancer with two tumors in the ascending and descending colon, which was picked up from a routine physical. He notes that he was treated with chemotherapy with Levamisole and Leukovorin for 6 months. He has not had any issues with the colon cancer since. He continue to see Dr. Cristina Gong for his GI.   He notes that he has had blood in the urine for a year and a half, picked up in a physical. This prompted his TURP surgery in July 2018. He notes that the bleeding persisted after the surgery. He notes that a stent was placed which has significantly reduced his hematuria. In February 2019 his left uretral mass was identified and subsequently biopsied confirming adenocarcinoma. The pt notes that Dr. Tresa Moore has up to this point been considering removing one of his kidneys, which would have a bearing on our treatment.   A 04/21/17 CT Abdomen identified a right retroperitoneal mass which was biopsied on 05/08/17 which revealed Large B-Cell Lymphoma.   He notes no ongoing health problems and is without physical limitations. He denies having had any blood transfusions.   On 04/23/17 the pt had a Uretral biopsy revealing Ureter, biopsy, Left mass- SUPERFICIAL FRAGMENTS OF UROTHELIAL CARCINOMA ASSOCIATED WITH GRANULATION TISSUE AND NECROSIS.   CT Abdomen of 04/21/17 revealed large right para-aortic retroperitoneal nodal  conglomeration in the abdomen, just inferior to the right renal artery. Imaging features compatible with metastatic disease in this patient with a history of prostate and left urothelial cancer. 2. Aortic atherosclerosis.   Soft Tissue Needle/core biopsy completed on 05/08/17 with results revealing Large B-Cell Lymphoma.   Most recent lab results (05/08/17) of CBC  is as follows: all values are WNL except for WBC at 11.3k, Monocytes Abs at 1.1k, CO2 at 19, Glucose at 109, Creatinine at 1.54, ALT at 16.  On review of systems, pt reports infrequent hematuria, mild back pain associated with golfing, and denies leg swelling, flank pain, fevers, chills, night sweats and any other symptoms.   On PMHx the pt reports Colon cancer in 1993, hypercholesterolemia, glaucoma, low risk prostate cancer, mastoiditis, hernia repair, carpel tunnel. He denies heart or lung problems.  On Social Hx the pt denies smoking and ETOH consumption. On Family Hx the pt reports that his mother had breast cancer when she was roughly 64, and also cervical cancer. On his mother's side, his aunts and uncles had several cancers including lung. Maternal grandfather had prostate cancer.   Interval History:  NATHINEL Marshall returns today for management and evaluation of his Diffuse Large B-Cell Lymphoma and Urothelial carcinoma. [name] was on 05/18/2018. The pt reports that he is doing well overall.  The pt reports some fatigue but no other concerns. He has been walking about a mile and a half daily and plays golf occasionally. He had a transurethral resection of his bladder  tumor yesterday (10/21/2018). Denies fevers, chills, night sweats, infection concerns, belly pain, and weight changes.  Lab results today (10/22/2018) of CBC w/diff and CMP is as follows: all values are WNL except for Wbc at 13.4k, RBC at 3.95, HGB at 12.4, HCT at 38.0, neutro abs at 11.0k. 10/22/2018 LDH is . Lab Results  Component Value  Date   LDH 88 (L) 10/22/2018   10/22/2018 CMP shows stable renal function.  On review of systems, pt reports some fatigue and denies fevers, chills, night sweats, infection concerns, weight changes, belly pain, and any other symptoms.   MEDICAL HISTORY:  Past Medical History:  Diagnosis Date  . BPH (benign prostatic hyperplasia)   . CKD (chronic kidney disease), stage III ([place])   . DDD (degenerative disc disease), lumbar    L4-L5  . Depression   . Diffuse large B cell lymphoma [facility]) oncologist-- dr Irene Limbo   dx 03/ 2019---  bx right retroperitoneal mass, Stage II,  completed radiation 03-19-2018  . Early stage glaucoma    both eyes  . History of adenomatous polyp of colon   . History of cancer chemotherapy    completed 08-27-2017  . History of colon cancer followed by GI-- dr Cristina Gong   dx 1993  s/p  hemicolectomy (malignant tumor x2)  and chemo therapy completed 1993--- (10-20-2018 no recurrence per pt)  . History of radiation therapy 02/24/18- 03/19/18   Abdomen, 1.8 Gy in 17 fractions for a total dose of 30.6 Gy.   [name] Kitchen Hyperlipidemia   . Recurrent malignant neoplasm of bladder [facility]) urologist-- dr Tresa Moore   first dx 10/ 2019  s/p  TURBT  . Right-sided sensorineural hearing loss   . Solitary right kidney    acquired--- s/p  left nephroureterectomy 09-24-2017  . Urothelial carcinoma of left distal ureter [facility]) oncologist-- dr Irene Limbo   dx 02/ 2019  --   chemo completed 07/ 2019;  s/p  left nephroureterectomy 09-24-2017,      SURGICAL HISTORY: Past Surgical History:  Procedure Laterality Date  . APPENDECTOMY    . CARPAL TUNNEL RELEASE Left 08/13/2013   Procedure: CARPAL TUNNEL RELEASE LEFT WRIST;  Surgeon: Yvette Rack., MD;  Location: [place];  Service: Orthopedics;  Laterality: Left;  . CATARACT EXTRACTION W/ INTRAOCULAR LENS  IMPLANT, BILATERAL  07/2018  . CYSTOSCOPY W/ RETROGRADES Bilateral 04/03/2017   Procedure: CYSTOSCOPY WITH RETROGRADE PYELOGRAM, LEFT  URETEROSCOPY, LEFT STENT PLACEMENT;  Surgeon: Franchot Gallo, MD;  Location: [facility];  Service: Urology;  Laterality: Bilateral;  . CYSTOSCOPY W/ RETROGRADES Bilateral 12/12/2017   Procedure: CYSTOSCOPY WITH RETROGRADE PYELOGRAM;  Surgeon: Alexis Frock, MD;  Location: [facility];  Service: Urology;  Laterality: Bilateral;  . CYSTOSCOPY W/ URETERAL STENT PLACEMENT Left 04/23/2017   Procedure: CYSTOSCOPY WITH STENT REPLACEMENT;  Surgeon: Alexis Frock, MD;  Location: [facility];  Service: Urology;  Laterality: Left;  . CYSTOSCOPY WITH FULGERATION Left 04/03/2017   Procedure: LEFT URETERAL BIOPSY;  Surgeon: Franchot Gallo, MD;  Location: [facility];  Service: Urology;  Laterality: Left;  . CYSTOSCOPY/RETROGRADE/URETEROSCOPY Left 04/23/2017   Procedure: CYSTOSCOPY/RETROGRADE/URETEROSCOPY, LEFT  URETEROSCOPY WITY  BIOPSY;  Surgeon: Alexis Frock, MD;  Location: [facility];  Service: Urology;  Laterality: Left;  . HEMICOLECTOMY  1993   malignant tumor x2 , colon cancer and Appendectomy  . INGUINAL HERNIA REPAIR Right 2003  . IR FLUORO GUIDE PORT INSERTION RIGHT  05/30/2017  . IR REMOVAL TUN ACCESS  W/ PORT W/O FL MOD SED  07/06/2018  . IR US GUIDE VASC ACCESS RIGHT  05/30/2017  . MASTOIDECTOMY Right 2015  . ROBOT ASSITED LAPAROSCOPIC NEPHROURETERECTOMY Left 09/24/2017   Procedure: XI ROBOT ASSITED LAPAROSCOPIC NEPHROURETERECTOMY;  Surgeon: Alexis Frock, MD;  Location: [facility];  Service: Urology;  Laterality: Left;  . ROBOTIC ASSISTED LAPAROSCOPIC LYSIS OF ADHESION  09/24/2017   Procedure: XI ROBOTIC ASSISTED LAPAROSCOPIC EXTENSIVE LYSIS OF ADHESION;  Surgeon: Alexis Frock, MD;  Location: [facility];  Service: Urology;;  . Lonell Face  last one 06/ 2018  . TRANSURETHRAL RESECTION OF BLADDER TUMOR N/A 12/12/2017   Procedure: TRANSURETHRAL RESECTION OF BLADDER TUMOR (TURBT);  Surgeon: Alexis Frock, MD;  Location:  [facility];  Service: Urology;  Laterality: N/A;  . TRANSURETHRAL RESECTION OF PROSTATE N/A 09/02/2016   Procedure: TRANSURETHRAL RESECTION OF THE PROSTATE (TURP);  Surgeon: Franchot Gallo, MD;  Location: [facility];  Service: Urology;  Laterality: N/A;    SOCIAL HISTORY: Social History   Socioeconomic History  . Marital status: Married    Spouse name: Not on file  . Number of children: Not on file  . Years of education: Not on file  . Highest education level: Not on file  Occupational History  . Not on file  Social Needs  . Financial resource strain: Not on file  . Food insecurity    Worry: Not on file    Inability: Not on file  . Transportation needs    Medical: No    Non-medical: No  Tobacco Use  . Smoking status: Never Smoker  . Smokeless tobacco: Never Used  Substance and Sexual Activity  . Alcohol use: Yes    Alcohol/week: 1.0 standard drinks    Types: 1 Glasses of wine per week    Comment: rare  . Drug use: Never  . Sexual activity: Not Currently  Lifestyle  . Physical activity    Days per week: Not on file    Minutes per session: Not on file  . Stress: Not on file  Relationships  . Social Herbalist on phone: Not on file    Gets together: Not on file    Attends religious service: Not on file    Active member of club or organization: Not on file    Attends meetings of clubs or organizations: Not on file    Relationship status: Not on file  . Intimate partner violence    Fear of current or ex partner: Not on file    Emotionally abused: Not on file    Physically abused: Not on file    Forced sexual activity: Not on file  Other Topics Concern  . Not on file  Social History Narrative  . Not on file    FAMILY HISTORY: No family history on file.  ALLERGIES:  has No Known Allergies.  MEDICATIONS:  Current Outpatient Medications  Medication Sig Dispense Refill  . fluticasone (FLONASE) 50 MCG/ACT nasal spray  Place 1 spray into both nostrils as needed.     . latanoprost (XALATAN) 0.005 % ophthalmic solution Place 1 drop into both eyes at bedtime.    . lovastatin (MEVACOR) 40 MG tablet Take 40 mg by mouth every evening.    [name] Kitchen oxybutynin (DITROPAN-XL) 5 MG 24 hr tablet Take 5 mg by mouth at bedtime.    . sertraline (ZOLOFT) 25 MG tablet Take 25 mg by mouth every morning.     . timolol (TIMOPTIC) 0.5 % ophthalmic  solution Place 1 drop into both eyes daily.      No current facility-administered medications for this visit.    Facility-Administered Medications Ordered in Other Visits  Medication Dose Route Frequency Provider Last Rate Last Dose  . [START ON 10/21/2018] gentamicin (GARAMYCIN) 400 mg in dextrose 5 % 100 mL IVPB  400 mg Intravenous 30 min Pre-Op Alexis Frock, MD        REVIEW OF SYSTEMS:    A 10+ POINT REVIEW OF SYSTEMS WAS OBTAINED including neurology, dermatology, psychiatry, cardiac, respiratory, lymph, extremities, GI, GU, Musculoskeletal, constitutional, breasts, reproductive, HEENT.  All pertinent positives are noted in the HPI.  All others are negative.   PHYSICAL EXAMINATION: ECOG PERFORMANCE STATUS: 1 - Symptomatic but completely ambulatory  There were no vitals filed for this visit. There were no vitals filed for this visit. .There is no height or weight on file to calculate BMI.  GENERAL:alert, in no acute distress and comfortable SKIN: no acute rashes, no significant lesions EYES: conjunctiva are pink and non-injected, sclera anicteric OROPHARYNX: MMM, no exudates, no oropharyngeal erythema or ulceration NECK: supple, no JVD LYMPH:  no palpable lymphadenopathy in the cervical, axillary or inguinal regions LUNGS: clear to auscultation b/l with normal respiratory effort HEART: regular rate & rhythm ABDOMEN:  normoactive bowel sounds , non tender, not distended. No palpable hepatosplenomegaly.  Extremity: no pedal edema PSYCH: alert & oriented x 3 with fluent speech  NEURO: no focal motor/sensory deficits   LABORATORY DATA:  I have reviewed the data as listed  . CBC Latest Ref Rng & Units 07/06/2018 05/18/2018 03/17/2018  WBC 4.0 - 10.5 K/uL 7.6 7.9 7.9  Hemoglobin 13.0 - 17.0 g/dL 12.0(L) 12.6(L) 12.7(L)  Hematocrit 39.0 - 52.0 % 38.5(L) 38.9(L) 38.8(L)  Platelets 150 - 400 K/uL 189 190 169    . CMP Latest Ref Rng & Units 05/18/2018 03/17/2018 01/14/2018  Glucose 70 - 99 mg/dL 111(H) 82 78  BUN 8 - 23 mg/dL 23 24(H) 23  Creatinine 0.61 - 1.24 mg/dL 2.02(H) 2.25(H) 2.20(H)  Sodium 135 - 145 mmol/L 142 138 141  Potassium 3.5 - 5.1 mmol/L 4.4 4.3 4.3  Chloride 98 - 111 mmol/L 112(H) 112(H) 114(H)  CO2 22 - 32 mmol/L 20(L) 19(L) 19(L)  Calcium 8.9 - 10.3 mg/dL 8.8(L) 9.3 9.8  Total Protein 6.5 - 8.1 g/dL 6.5 6.8 7.0  Total Bilirubin 0.3 - 1.2 mg/dL 0.5 0.4 0.5  Alkaline Phos 38 - 126 U/L 64 61 61  AST 15 - 41 U/L 24 9(L) 15  ALT 0 - 44 U/L 34 12 18    Component     Latest Ref Rng & Units 05/15/2017  Retic Ct Pct     0.8 - 1.8 % 1.6  RBC.     4.20 - 5.82 MIL/uL 4.62  Retic Count, Absolute     34.8 - 93.9 K/uL 73.9  Prothrombin Time     11.4 - 15.2 seconds 14.5  INR      1.13  APTT     24 - 36 seconds 31  HIV Screen 4th Generation wRfx     Non Reactive Non Reactive  HCV Ab     0.0 - 0.9 s/co ratio <0.1  Hep B Core Ab, Tot     Negative Negative  Hepatitis B Surface Ag     Negative Negative  LDH     125 - 245 U/L 132   . Lab Results  Component Value Date   LDH 167 05/18/2018  12/12/17 Biopsy:    05/08/17 Soft Tissue Needle Core Biopsy:   04/23/17 Ureter Biopsy:  10/21/2018 Surgical Pathology   RADIOGRAPHIC STUDIES: I have personally reviewed the radiological images as listed and agreed with the findings in the report. No results found. 04/21/17 CT Abdomen  ASSESSMENT & PLAN:   85 y.o. male with  1. Diffuse Large B Cell Lymphoma - Stage II -bulky  05/26/17 PET/CT which showed 1. 5 cm right-sided retroperitoneal  nodal mass is hypermetabolic and consistent with known lymphoma. Small lymph node just below the aortic bifurcation is also hypermetabolic. -ECHO shows normal EF.  S/p 4 cycles of R-mini CHOP  07/28/17 PET/CT which revealed Significant response to interval therapy. The large retroperitoneal mass is significantly smaller with minimal residual activity, slightly greater than blood pool ([place] 3). No evidence of disease progression.  12/26/17 PET/CT revealed No evidence of lymphoma recurrence on FDG PET scan. 2. Continued decrease in size and metabolic activity retroperitoneal periaortic mass. ([place] 2) 3. LEFT nephrectomy.  S/p 30.6 Gy in 17 fractions completed on 03/19/18 with Dr. Isidore Moos  2. 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  -s/p left nephroureterectomy pm 09/24/2017 -continue urologic f/u with Dr Tresa Moore.  3. S/p post operative Acute UTI with E.coli with sepsis and dehydration 4.s/p  AKI on CKD- single kidney. - likely due to poor po intake- creatinine improved with IVF 5. Protein calorie malnutrition-moderate 6. 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 S/p TURB tumor on 12/12/17 with Dr. Tresa Moore in urology  PLAN -Discussed pt labwork today, 10/22/2018; blood counts are stable, elevated WBC likely due to surgery yesterday -The pt shows no clinical or lab progression of his DLBCL at this time. LDH WNL -No indication for further treatment of his DLBCL at this time -Avoid NSAIDs, infections and dehydration to protect kidneys. Tylenol if necessary  -Recommended that the pt continue to eat well, drink at least 48-64 oz of water each day, and walk 20-30 minutes each day.  -Will see the pt back in 4 months   RTC with Dr Irene Limbo  with labs in 4 months   All of the patients questions were answered with apparent satisfaction. The patient knows to call the clinic with any problems, questions or concerns.  The total time spent in the appt was 20 minutes and more than 50% was on counseling and direct patient cares.   Sullivan Lone MD MS AAHIVMS [facility] Hematology/Oncology Physician [facility]  (Office):       [phone] (Work cell):  [phone] (Fax):           [phone]  10/20/2018 8:38 PM  I, De Burrs, am acting as a scribe for Dr. Irene Limbo  .I have reviewed the above documentation for accuracy and completeness, and I agree with the above. Brunetta Genera MD

## 2018-10-20 NOTE — Progress Notes (Addendum)
Spoke w/ pt via phone for pre-op interview.  Npo after mn. Arrive at 0945.  Needs istat 8.  Pt had covid test done 10-17-2018.  Will take zoloft am dos w/ sips of water and do eye drops as usual.

## 2018-10-21 ENCOUNTER — Other Ambulatory Visit: Payer: Self-pay

## 2018-10-21 ENCOUNTER — Ambulatory Visit (HOSPITAL_BASED_OUTPATIENT_CLINIC_OR_DEPARTMENT_OTHER)
Admission: RE | Admit: 2018-10-21 | Discharge: 2018-10-21 | Disposition: A | Discharge status: Home / Self Care | Payer: Medicare Other | Attending: Urology | Admitting: Urology

## 2018-10-21 ENCOUNTER — Encounter (HOSPITAL_BASED_OUTPATIENT_CLINIC_OR_DEPARTMENT_OTHER): Payer: Self-pay | Admitting: *Deleted

## 2018-10-21 ENCOUNTER — Ambulatory Visit (HOSPITAL_BASED_OUTPATIENT_CLINIC_OR_DEPARTMENT_OTHER): Payer: Medicare Other | Admitting: Anesthesiology

## 2018-10-21 ENCOUNTER — Encounter (HOSPITAL_BASED_OUTPATIENT_CLINIC_OR_DEPARTMENT_OTHER)
Admission: RE | Disposition: A | Discharge status: Home / Self Care | Payer: Self-pay | Source: Home / Self Care | Attending: Urology

## 2018-10-21 DIAGNOSIS — H9041 Sensorineural hearing loss, unilateral, right ear, with unrestricted hearing on the contralateral side: Secondary | ICD-10-CM | POA: Diagnosis not present

## 2018-10-21 DIAGNOSIS — C679 Malignant neoplasm of bladder, unspecified: Secondary | ICD-10-CM | POA: Diagnosis not present

## 2018-10-21 DIAGNOSIS — E785 Hyperlipidemia, unspecified: Secondary | ICD-10-CM | POA: Insufficient documentation

## 2018-10-21 DIAGNOSIS — Z8554 Personal history of malignant neoplasm of ureter: Secondary | ICD-10-CM | POA: Insufficient documentation

## 2018-10-21 DIAGNOSIS — Z905 Acquired absence of kidney: Secondary | ICD-10-CM | POA: Insufficient documentation

## 2018-10-21 DIAGNOSIS — Z9842 Cataract extraction status, left eye: Secondary | ICD-10-CM | POA: Diagnosis not present

## 2018-10-21 DIAGNOSIS — Z980 Intestinal bypass and anastomosis status: Secondary | ICD-10-CM | POA: Insufficient documentation

## 2018-10-21 DIAGNOSIS — Z9221 Personal history of antineoplastic chemotherapy: Secondary | ICD-10-CM | POA: Diagnosis not present

## 2018-10-21 DIAGNOSIS — M1990 Unspecified osteoarthritis, unspecified site: Secondary | ICD-10-CM | POA: Diagnosis not present

## 2018-10-21 DIAGNOSIS — Z8551 Personal history of malignant neoplasm of bladder: Secondary | ICD-10-CM | POA: Diagnosis not present

## 2018-10-21 DIAGNOSIS — M5136 Other intervertebral disc degeneration, lumbar region: Secondary | ICD-10-CM | POA: Diagnosis not present

## 2018-10-21 DIAGNOSIS — Z923 Personal history of irradiation: Secondary | ICD-10-CM | POA: Diagnosis not present

## 2018-10-21 DIAGNOSIS — Z86010 Personal history of colonic polyps: Secondary | ICD-10-CM | POA: Diagnosis not present

## 2018-10-21 DIAGNOSIS — N400 Enlarged prostate without lower urinary tract symptoms: Secondary | ICD-10-CM | POA: Insufficient documentation

## 2018-10-21 DIAGNOSIS — Z8572 Personal history of non-Hodgkin lymphomas: Secondary | ICD-10-CM | POA: Diagnosis not present

## 2018-10-21 DIAGNOSIS — Z85038 Personal history of other malignant neoplasm of large intestine: Secondary | ICD-10-CM | POA: Diagnosis not present

## 2018-10-21 DIAGNOSIS — N183 Chronic kidney disease, stage 3 (moderate): Secondary | ICD-10-CM | POA: Diagnosis not present

## 2018-10-21 DIAGNOSIS — D649 Anemia, unspecified: Secondary | ICD-10-CM | POA: Insufficient documentation

## 2018-10-21 DIAGNOSIS — Z9841 Cataract extraction status, right eye: Secondary | ICD-10-CM | POA: Insufficient documentation

## 2018-10-21 DIAGNOSIS — Z9049 Acquired absence of other specified parts of digestive tract: Secondary | ICD-10-CM | POA: Diagnosis not present

## 2018-10-21 DIAGNOSIS — F329 Major depressive disorder, single episode, unspecified: Secondary | ICD-10-CM | POA: Diagnosis not present

## 2018-10-21 HISTORY — PX: CYSTOSCOPY W/ RETROGRADES: SHX1426

## 2018-10-21 HISTORY — DX: Reserved for concepts with insufficient information to code with codable children: IMO0002

## 2018-10-21 HISTORY — DX: Malignant neoplasm of left ureter: C66.2

## 2018-10-21 HISTORY — DX: Personal history of antineoplastic chemotherapy: Z92.21

## 2018-10-21 HISTORY — DX: Chronic kidney disease, stage 3 unspecified: N18.30

## 2018-10-21 HISTORY — DX: Malignant neoplasm of bladder, unspecified: C67.9

## 2018-10-21 HISTORY — DX: Diffuse large B-cell lymphoma, unspecified site: C83.30

## 2018-10-21 HISTORY — DX: Unspecified sensorineural hearing loss: H90.5

## 2018-10-21 HISTORY — PX: TRANSURETHRAL RESECTION OF BLADDER TUMOR: SHX2575

## 2018-10-21 LAB — POCT I-STAT, CHEM 8
BUN: 28 mg/dL — ABNORMAL HIGH (ref 8–23)
Calcium, Ion: 1.35 mmol/L (ref 1.15–1.40)
Chloride: 110 mmol/L (ref 98–111)
Creatinine, Ser: 2 mg/dL — ABNORMAL HIGH (ref 0.61–1.24)
Glucose, Bld: 89 mg/dL (ref 70–99)
HCT: 45 % (ref 39.0–52.0)
Hemoglobin: 15.3 g/dL (ref 13.0–17.0)
Potassium: 5 mmol/L (ref 3.5–5.1)
Sodium: 141 mmol/L (ref 135–145)
TCO2: 21 mmol/L — ABNORMAL LOW (ref 22–32)

## 2018-10-21 SURGERY — TURBT (TRANSURETHRAL RESECTION OF BLADDER TUMOR)
Anesthesia: General | Site: Renal | Laterality: Right

## 2018-10-21 MED ORDER — ONDANSETRON HCL 4 MG/2ML IJ SOLN
INTRAMUSCULAR | Status: DC | PRN
  Administered 2018-10-21: 4 mg via INTRAVENOUS

## 2018-10-21 MED ORDER — ONDANSETRON HCL 4 MG/2ML IJ SOLN
INTRAMUSCULAR | Status: AC
  Filled 2018-10-21: qty 2

## 2018-10-21 MED ORDER — FENTANYL CITRATE (PF) 100 MCG/2ML IJ SOLN
INTRAMUSCULAR | Status: AC
  Filled 2018-10-21: qty 2

## 2018-10-21 MED ORDER — IOHEXOL 300 MG/ML  SOLN
INTRAMUSCULAR | Status: DC | PRN
  Administered 2018-10-21: 12:00:00 6 mL

## 2018-10-21 MED ORDER — GLYCOPYRROLATE PF 0.2 MG/ML IJ SOSY
PREFILLED_SYRINGE | INTRAMUSCULAR | Status: DC | PRN
  Administered 2018-10-21: .2 mg via INTRAVENOUS

## 2018-10-21 MED ORDER — EPHEDRINE SULFATE-NACL 50-0.9 MG/10ML-% IV SOSY
PREFILLED_SYRINGE | INTRAVENOUS | Status: DC | PRN
  Administered 2018-10-21 (×2): 10 mg via INTRAVENOUS

## 2018-10-21 MED ORDER — PROPOFOL 10 MG/ML IV BOLUS
INTRAVENOUS | Status: DC | PRN
  Administered 2018-10-21: 150 mg via INTRAVENOUS

## 2018-10-21 MED ORDER — LIDOCAINE 2% (20 MG/ML) 5 ML SYRINGE
INTRAMUSCULAR | Status: DC | PRN
  Administered 2018-10-21: 60 mg via INTRAVENOUS

## 2018-10-21 MED ORDER — DEXAMETHASONE SODIUM PHOSPHATE 10 MG/ML IJ SOLN
INTRAMUSCULAR | Status: AC
  Filled 2018-10-21: qty 1

## 2018-10-21 MED ORDER — FENTANYL CITRATE (PF) 100 MCG/2ML IJ SOLN
INTRAMUSCULAR | Status: DC | PRN
  Administered 2018-10-21: 50 ug via INTRAVENOUS

## 2018-10-21 MED ORDER — LIDOCAINE 2% (20 MG/ML) 5 ML SYRINGE
INTRAMUSCULAR | Status: AC
  Filled 2018-10-21: qty 5

## 2018-10-21 MED ORDER — DEXAMETHASONE SODIUM PHOSPHATE 10 MG/ML IJ SOLN
INTRAMUSCULAR | Status: DC | PRN
  Administered 2018-10-21: 5 mg via INTRAVENOUS

## 2018-10-21 MED ORDER — SODIUM CHLORIDE 0.9 % IR SOLN
Status: DC | PRN
  Administered 2018-10-21: 3000 mL via INTRAVESICAL

## 2018-10-21 MED ORDER — SODIUM CHLORIDE 0.9 % IV SOLN
INTRAVENOUS | Status: DC
  Filled 2018-10-21: qty 1000

## 2018-10-21 MED ORDER — SODIUM CHLORIDE 0.9 % IV SOLN
INTRAVENOUS | Status: DC
  Administered 2018-10-21: 10:00:00 via INTRAVENOUS
  Filled 2018-10-21: qty 1000

## 2018-10-21 MED ORDER — GENTAMICIN SULFATE 40 MG/ML IJ SOLN
1.5000 mg/kg | INTRAVENOUS | Status: AC
  Administered 2018-10-21: 11:00:00 110 mg via INTRAVENOUS
  Filled 2018-10-21: qty 2.75

## 2018-10-21 MED ORDER — GLYCOPYRROLATE PF 0.2 MG/ML IJ SOSY
PREFILLED_SYRINGE | INTRAMUSCULAR | Status: AC
  Filled 2018-10-21: qty 1

## 2018-10-21 MED ORDER — TRAMADOL HCL 50 MG PO TABS: 50.0000 mg | ORAL_TABLET | Freq: Four times a day (QID) | ORAL | 0 refills | Status: AC | PRN

## 2018-10-21 MED ORDER — EPHEDRINE 5 MG/ML INJ
INTRAVENOUS | Status: AC
  Filled 2018-10-21: qty 10

## 2018-10-21 SURGICAL SUPPLY — 31 items
BAG DRAIN URO-CYSTO SKYTR STRL (DRAIN) ×4 IMPLANT
BAG URINE DRAINAGE (UROLOGICAL SUPPLIES) IMPLANT
BAG URINE LEG 500ML (DRAIN) IMPLANT
CATH FOLEY 2WAY SLVR  5CC 22FR (CATHETERS)
CATH FOLEY 2WAY SLVR 30CC 20FR (CATHETERS) IMPLANT
CATH FOLEY 2WAY SLVR 5CC 22FR (CATHETERS) IMPLANT
CATH INTERMIT  6FR 70CM (CATHETERS) ×4 IMPLANT
CLOTH BEACON ORANGE TIMEOUT ST (SAFETY) ×4 IMPLANT
ELECT REM PT RETURN 9FT ADLT (ELECTROSURGICAL)
ELECTRODE REM PT RTRN 9FT ADLT (ELECTROSURGICAL) IMPLANT
EVACUATOR MICROVAS BLADDER (UROLOGICAL SUPPLIES) IMPLANT
GLOVE BIO SURGEON STRL SZ7.5 (GLOVE) ×4 IMPLANT
GLOVE BIOGEL PI IND STRL 6.5 (GLOVE) ×2 IMPLANT
GLOVE BIOGEL PI INDICATOR 6.5 (GLOVE) ×2
GLOVE SURG SS PI 6.5 STRL IVOR (GLOVE) ×8 IMPLANT
GOWN STRL REUS W/ TWL LRG LVL3 (GOWN DISPOSABLE) ×2 IMPLANT
GOWN STRL REUS W/TWL LRG LVL3 (GOWN DISPOSABLE) ×10 IMPLANT
GUIDEWIRE ANG ZIPWIRE 038X150 (WIRE) ×4 IMPLANT
GUIDEWIRE STR DUAL SENSOR (WIRE) IMPLANT
IV NS 1000ML (IV SOLUTION)
IV NS 1000ML BAXH (IV SOLUTION) IMPLANT
IV NS IRRIG 3000ML ARTHROMATIC (IV SOLUTION) ×4 IMPLANT
KIT TURNOVER CYSTO (KITS) ×4 IMPLANT
LOOP CUT BIPOLAR 24F LRG (ELECTROSURGICAL) ×4 IMPLANT
MANIFOLD NEPTUNE II (INSTRUMENTS) ×4 IMPLANT
NS IRRIG 500ML POUR BTL (IV SOLUTION) ×4 IMPLANT
PACK CYSTO (CUSTOM PROCEDURE TRAY) ×4 IMPLANT
SYR 10ML LL (SYRINGE) IMPLANT
SYRINGE IRR TOOMEY STRL 70CC (SYRINGE) IMPLANT
TUBE CONNECTING 12'X1/4 (SUCTIONS)
TUBE CONNECTING 12X1/4 (SUCTIONS) IMPLANT

## 2018-10-21 NOTE — Discharge Instructions (Signed)
1 - You may have urinary urgency (bladder spasms) and bloody urine on / off x few days. This is normal.  2 - Call MD or go to ER for fever >102, severe pain / nausea / vomiting not relieved by medications, or acute change in medical status                                              Transurethral Procedure  Medications: Resume all your other meds from home  Activity: 1. No heavy lifting > 10 pounds for 2 weeks. 2. No sexual activity for 2 weeks. 3. No strenuous activity for 2 weeks. 4. No driving while on narcotic pain medications. 5. Drink plenty of water. 6. Continue to walk at home - you can still get blood clots when you are at home so keep     active but don't over do it. 7. Your urine may have some blood in it - make sure you drink plenty of water. Call or           come to the ER immediately if you catheter stops draining or you are unable to urinate.  Bathing: You can shower. You can take a bath unless you have a foley catheter in place.  Signs / Symptoms to call: 1. Call if you have a fever greater than 101.5  2. Uncontrolled nausea / vomiting, uncontrolled pain / dizziness, unable to urinate, leg         swelling / leg pain, or any other concerns.      Post Anesthesia Home Care Instructions  Activity: Get plenty of rest for the remainder of the day. A responsible adult should stay with you for 24 hours following the procedure.  For the next 24 hours, DO NOT: -Drive a car -Paediatric nurse -Drink alcoholic beverages -Take any medication unless instructed by your physician -Make any legal decisions or sign important papers.  Meals: Start with liquid foods such as gelatin or soup. Progress to regular foods as tolerated. Avoid greasy, spicy, heavy foods. If nausea and/or vomiting occur, drink only clear liquids until the nausea and/or vomiting subsides. Call your physician if vomiting continues.  Special Instructions/Symptoms: Your throat may feel dry or sore from  the anesthesia or the breathing tube placed in your throat during surgery. If this causes discomfort, gargle with warm salt water. The discomfort should disappear within 24 hours.  If you had a scopolamine patch placed behind your ear for the management of post- operative nausea and/or vomiting:  1. The medication in the patch is effective for 72 hours, after which it should be removed.  Wrap patch in a tissue and discard in the trash. Wash hands thoroughly with soap and water. 2. You may remove the patch earlier than 72 hours if you experience unpleasant side effects which may include dry mouth, dizziness or visual disturbances. 3. Avoid touching the patch. Wash your hands with soap and water after contact with the patch.

## 2018-10-21 NOTE — H&P (Signed)
Jonathan Marshall is an 85 y.o. male.    Chief Complaint: Pre-OP TURBT / Retrogrades  HPI:   1 - Left Ureteral Cancer / Bladder Cancer - s/p left nephro-ureterectomy 08/2017 for large volume [id number] HIgh Grade Left ureteral cancer with NEGATIVE margins. Pre-op not amenable to endoscopic management. Case required extensive adhesiolysis given h/o colon resection.   Recent Surveillance:  10/2017 Cysto multifocal bladder tumors, estimate 6cm total => TaG3 by TURBT 11/2017, + muscle in specimen ==> Induction BCG  12/2017 - PET/CT no recurrence renal tumors / lymphoma.  03/2018 - PET/CT no recurence of renal tumors, improved lymphoma, cysto no recurrence; 06/2018 cysto - no recurrence (?early retrowth Rt dome), PSA 3.01.  09/2018 - cysto about 2cm Rt sided tumor.   2 - Very Low Risk Prostate Cancer - <5% Gleason 6 disease incidental in TURP chips 08/2016 ==> surveillance.   3 - Prostatic Hypertrophy with Lower Urinary Tract Symptoms - s/p TURP 08/2016 by [facility]. On finasteride post-op to prevent regrowth.   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 Underwent chemo under care pf Carolyne Fiscal MD and XRT by S. Squire with [facility].   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 Cr 2.'s post-op.    PMH sig for colon cancer (s/p primary resection / re-anastamosis (never had colosomy) + chemo 1993, follows GI [facility] now), Rt inguinal hernia repair. His PCP is Lorene Dy MD   Today " Jonathan Marshall " is seen for Rt retrograde and TURBT for recurrent bladder cancer. Most recent UCX negative.     Past Medical History:  Diagnosis Date  . BPH (benign prostatic hyperplasia)   . CKD (chronic kidney disease), stage III ([place])   . DDD (degenerative disc disease), lumbar    L4-L5  . Depression   . Diffuse large B cell lymphoma [facility]) oncologist-- dr Irene Limbo   dx 03/ 2019---  bx right retroperitoneal  mass, Stage II,  completed radiation 03-19-2018  . Early stage glaucoma    both eyes  . History of adenomatous polyp of colon   . History of cancer chemotherapy    completed 08-27-2017  . History of colon cancer followed by GI-- dr Cristina Gong   dx 1993  s/p  hemicolectomy (malignant tumor x2)  and chemo therapy completed 1993--- (10-20-2018 no recurrence per pt)  . History of radiation therapy 02/24/18- 03/19/18   Abdomen, 1.8 Gy in 17 fractions for a total dose of 30.6 Gy.   [name] Kitchen Hyperlipidemia   . Recurrent malignant neoplasm of bladder [facility]) urologist-- dr Tresa Moore   first dx 10/ 2019  s/p  TURBT  . Right-sided sensorineural hearing loss   . Solitary right kidney    acquired--- s/p  left nephroureterectomy 09-24-2017  . Urothelial carcinoma of left distal ureter [facility]) oncologist-- dr Irene Limbo   dx 02/ 2019  --   chemo completed 07/ 2019;  s/p  left nephroureterectomy 09-24-2017,      Past Surgical History:  Procedure Laterality Date  . APPENDECTOMY    . CARPAL TUNNEL RELEASE Left 08/13/2013   Procedure: CARPAL TUNNEL RELEASE LEFT WRIST;  Surgeon: Yvette Rack., MD;  Location: [place];  Service: Orthopedics;  Laterality: Left;  . CATARACT EXTRACTION W/ INTRAOCULAR LENS  IMPLANT, BILATERAL  07/2018  . CYSTOSCOPY W/ RETROGRADES Bilateral 04/03/2017   Procedure: CYSTOSCOPY WITH RETROGRADE PYELOGRAM, LEFT URETEROSCOPY, LEFT  STENT PLACEMENT;  Surgeon: Franchot Gallo, MD;  Location: [facility];  Service: Urology;  Laterality: Bilateral;  . CYSTOSCOPY W/ RETROGRADES Bilateral 12/12/2017   Procedure: CYSTOSCOPY WITH RETROGRADE PYELOGRAM;  Surgeon: Alexis Frock, MD;  Location: [facility];  Service: Urology;  Laterality: Bilateral;  . CYSTOSCOPY W/ URETERAL STENT PLACEMENT Left 04/23/2017   Procedure: CYSTOSCOPY WITH STENT REPLACEMENT;  Surgeon: Alexis Frock, MD;  Location[facility];  Service: Urology;  Laterality: Left;  .  CYSTOSCOPY WITH FULGERATION Left 04/03/2017   Procedure: LEFT URETERAL BIOPSY;  Surgeon: Franchot Gallo, MD;  Location: [facility];  Service: Urology;  Laterality: Left;  . CYSTOSCOPY/RETROGRADE/URETEROSCOPY Left 04/23/2017   Procedure: CYSTOSCOPY/RETROGRADE/URETEROSCOPY, LEFT  URETEROSCOPY WITY  BIOPSY;  Surgeon: Alexis Frock, MD;  Location: [facility];  Service: Urology;  Laterality: Left;  . HEMICOLECTOMY  1993   malignant tumor x2 , colon cancer and Appendectomy  . INGUINAL HERNIA REPAIR Right 2003  . IR FLUORO GUIDE PORT INSERTION RIGHT  05/30/2017  . IR REMOVAL TUN ACCESS W/ PORT W/O FL MOD SED  07/06/2018  . IR US GUIDE VASC ACCESS RIGHT  05/30/2017  . MASTOIDECTOMY Right 2015  . ROBOT ASSITED LAPAROSCOPIC NEPHROURETERECTOMY Left 09/24/2017   Procedure: XI ROBOT ASSITED LAPAROSCOPIC NEPHROURETERECTOMY;  Surgeon: Alexis Frock, MD;  Location: [facility];  Service: Urology;  Laterality: Left;  . ROBOTIC ASSISTED LAPAROSCOPIC LYSIS OF ADHESION  09/24/2017   Procedure: XI ROBOTIC ASSISTED LAPAROSCOPIC EXTENSIVE LYSIS OF ADHESION;  Surgeon: Alexis Frock, MD;  Location: [facility];  Service: Urology;;  . Lonell Face  last one 06/ 2018  . TRANSURETHRAL RESECTION OF BLADDER TUMOR N/A 12/12/2017   Procedure: TRANSURETHRAL RESECTION OF BLADDER TUMOR (TURBT);  Surgeon: Alexis Frock, MD;  Location: [facility];  Service: Urology;  Laterality: N/A;  . TRANSURETHRAL RESECTION OF PROSTATE N/A 09/02/2016   Procedure: TRANSURETHRAL RESECTION OF THE PROSTATE (TURP);  Surgeon: Franchot Gallo, MD;  Location: [facility];  Service: Urology;  Laterality: N/A;    No family history on file. Social History:  reports that he has never smoked. He has never used smokeless tobacco. He reports current alcohol use of about 1.0 standard drinks of alcohol per week. He reports that he does not use drugs.  Allergies: No Known Allergies  No medications  prior to admission.    No results found for this or any previous visit (from the past 48 hour(s)). No results found.  Review of Systems  Constitutional: Positive for fever. Negative for chills.  Genitourinary: Negative for hematuria.  All other systems reviewed and are negative.   Height 5\' 9"  (1.753 m), weight 74.8 kg. Physical Exam  Constitutional: He appears well-developed.  HENT:  Head: Normocephalic.  Eyes: Pupils are equal, round, and reactive to light.  Neck: Normal range of motion.  Cardiovascular: Normal rate.  Respiratory: Effort normal.  GI: Soft.  Multiple prior scars w/o hernias.   Genitourinary:    Genitourinary Comments: No CVAT   Musculoskeletal: Normal range of motion.  Neurological: He is alert.  Skin: Skin is warm.  Psychiatric: He has a normal mood and affect.     Assessment/Plan  Proceed as planned with TURBT and Rt retrograde. Risks, benefits, alternatives, expected peri-op course discussed previously and reiterated today.   Alexis Frock, MD 10/21/2018, 8:42 AM

## 2018-10-21 NOTE — Brief Op Note (Signed)
10/21/2018  11:48 AM  PATIENT:  Jonathan Marshall  85 y.o. male  PRE-OPERATIVE DIAGNOSIS:  RECURRENT BLADDER CANCER  POST-OPERATIVE DIAGNOSIS:  RECURRENT BLADDER CANCER  PROCEDURE:  Procedure(s): TRANSURETHRAL RESECTION OF BLADDER TUMOR (TURBT) (N/A) CYSTOSCOPY WITH RETROGRADE PYELOGRAM (Right)  SURGEON:  Surgeon(s) and Role:    * Alexis Frock, MD - Primary  PHYSICIAN ASSISTANT:   ASSISTANTS: none   ANESTHESIA:   general  EBL:  minimal   BLOOD ADMINISTERED:none  DRAINS: none   LOCAL MEDICATIONS USED:  NONE  SPECIMEN:  Source of Specimen:  1 -bladder tumor; 2 - base of bladder tumor  DISPOSITION OF SPECIMEN:  PATHOLOGY  COUNTS:  YES  TOURNIQUET:  * No tourniquets in log *  DICTATION: .Other Dictation: Dictation Number [id number]  PLAN OF CARE: Discharge to home after PACU  PATIENT DISPOSITION:  PACU - hemodynamically stable.   Delay start of Pharmacological VTE agent (>24hrs) due to surgical blood loss or risk of bleeding: yes

## 2018-10-21 NOTE — Anesthesia Preprocedure Evaluation (Addendum)
Anesthesia Evaluation  Patient identified by MRN, date of birth, ID band Patient awake    Reviewed: Allergy & Precautions, NPO status , Patient's Chart, lab work & pertinent test results  Airway Mallampati: I  TM Distance: >3 FB Neck ROM: Full    Dental no notable dental hx. (+) Teeth Intact, Dental Advisory Given, Caps,    Pulmonary neg pulmonary ROS,    Pulmonary exam normal breath sounds clear to auscultation       Cardiovascular negative cardio ROS Normal cardiovascular exam Rhythm:Regular Rate:Normal  ECHO: Normal LV size with mild LV hypertrophy. EF 55%. Normal RV size and systolic function. No significant valvular abnormalities.   Neuro/Psych PSYCHIATRIC DISORDERS Depression negative neurological ROS     GI/Hepatic negative GI ROS, Neg liver ROS, History of colon cancer s/p chemo   Endo/Other  negative endocrine ROS  Renal/GU Renal disease     Musculoskeletal  (+) Arthritis , Osteoarthritis,    Abdominal Normal abdominal exam  (+)   Peds  Hematology  (+) Blood dyscrasia, anemia , HLD  Diffuse large B-cell lymphoma   Anesthesia Other Findings LEFT URETERAL CANCER  Reproductive/Obstetrics                            Anesthesia Physical  Anesthesia Plan  ASA: III  Anesthesia Plan: General   Post-op Pain Management:    Induction: Intravenous  PONV Risk Score and Plan: 2 and Ondansetron, Dexamethasone and Treatment may vary due to age or medical condition  Airway Management Planned: LMA and Oral ETT  Additional Equipment:   Intra-op Plan:   Post-operative Plan: Extubation in OR  Informed Consent: I have reviewed the patients History and Physical, chart, labs and discussed the procedure including the risks, benefits and alternatives for the proposed anesthesia with the patient or authorized representative who has indicated his/her understanding and acceptance.     Dental  advisory given  Plan Discussed with: CRNA, Surgeon and Anesthesiologist  Anesthesia Plan Comments:        Anesthesia Quick Evaluation

## 2018-10-21 NOTE — Anesthesia Procedure Notes (Signed)
Procedure Name: LMA Insertion Date/Time: 10/21/2018 11:24 AM Performed by: Suan Halter, CRNA Pre-anesthesia Checklist: Patient identified, Emergency Drugs available, Suction available and Patient being monitored Patient Re-evaluated:Patient Re-evaluated prior to induction Oxygen Delivery Method: Circle system utilized Preoxygenation: Pre-oxygenation with 100% oxygen Induction Type: IV induction Ventilation: Mask ventilation without difficulty LMA: LMA inserted LMA Size: 4.0 Number of attempts: 1 Airway Equipment and Method: Bite block Placement Confirmation: positive ETCO2 Tube secured with: Tape Dental Injury: Teeth and Oropharynx as per pre-operative assessment

## 2018-10-21 NOTE — Op Note (Signed)
NAMEDURAN, COELLO MEDICAL RECORD [id number] ACCOUNT [id number] DATE OF BIRTH:10-25-1932 FACILITY: [facility] LOCATION: [facility] PHYSICIAN:Mace Weinberg Tresa Moore, MD  OPERATIVE REPORT  DATE OF PROCEDURE:  10/21/2018  PREOPERATIVE DIAGNOSIS:  Small volume recurrent bladder cancer.  POSTOPERATIVE DIAGNOSIS:  Small volume recurrent bladder cancer.  PROCEDURE: 1.  Cystoscopy, retrograde pyelogram and interpretation. 2.  Transection of bladder tumor, volume small.  ESTIMATED BLOOD LOSS:  Nil.  COMPLICATIONS:  None.  SPECIMENS: 1.  Recurrent bladder tumor. 2.  Base of recurrent bladder tumor.  FINDINGS: 1.  Unremarkable right retrograde pyelogram. 2.  Prior TURP defect with wide open prostate. 3.  Small volume recurrent bladder tumor, right superolateral approximately 2 cm. 4.  Surgical absence of left ureteral orifice.  INDICATIONS:  The patient is a very pleasant but unfortunate 85 year old man with a very complex oncologic history.  He has had a multi-year course with recurrent multifocal urothelial carcinoma.  He is status post left nephroureterectomy and has been on  bladder surveillance and had some bladder recurrences.  He was found most recently on surveillance of his bladder to have a small volume recurrence.  Recent staging imaging was localized.  Options were discussed including recommended path of  transurethral resection for diagnostic and treatment purposes and he wished to proceed.  Informed consent was then placed in medical record.  DESCRIPTION OF PROCEDURE:  The patient was identified.  The procedure being transection of bladder tumor and right retrograde was confirmed.  Procedure timeout was performed.  Intravenous antibiotics administered.  General LMA anesthesia induced.  The  patient was placed into a low lithotomy position, sterile field was created prepped and draped base of the penis, perineum and proximal thighs using iodine.  Cystourethroscopy was performed using  21-French rigid cystoscope offset lens.  Inspection of  anterior and posterior urethra revealed a prior TURP defect with wide open prostate from the verumontanum to the bladder neck.  Inspection of bladder revealed mild trabeculation.  There were multifocal old resection sites without recurrence.  There was a  small papillary tumor approximately 2 cm in diameter with some erythema at the right superior area well away from the right ureteral orifice.  No significant satellite lesions noted.  The left ureteral orifice was surgically absent.  Attention was  directed at retrograde pyelogram.  The right ureteral orifice was cannulated with a #6 [place] renal catheter and right pyelogram was obtained.  Right retrograde pyelogram demonstrated a single right ureter with single system right kidney without infection or narrowing noted.  The cystoscope was exchanged for a 26-French resectoscope sheath with visual obturator and using resectoscope loop with  bipolar energy, very careful resection was performed of the small bladder tumor down to what appeared to be superficial fibromuscular stroma of the urinary bladder, taking exquisite care to avoid bladder perforation which did not occur.  Bladder tumor  fragments were irrigated and set aside for permanent pathology labeled bladder tumor.  Next, cold cup biopsy forceps were used to obtain deep aspect sampling, set aside and labeled as base of bladder tumor.  Again, exquisite care was taken to avoid  bladder perforation which did not occur grossly.  The base and surrounding area of the resection area was fulgurated.  Hemostasis appeared excellent.  The bladder was entered per cystoscope.  Procedure terminated.  The patient tolerated the procedure  well.  No immediate perioperative complications.  The patient was taken to the postanesthesia care in stable condition.  Plan for discharge home.  TN/NUANCE  D:10/21/2018 T:10/21/2018 JOB:[id number]

## 2018-10-21 NOTE — Transfer of Care (Signed)
Immediate Anesthesia Transfer of Care Note  Patient: Jonathan Marshall  Procedure(s) Performed: Procedure(s) (LRB): TRANSURETHRAL RESECTION OF BLADDER TUMOR (TURBT) (N/A) CYSTOSCOPY WITH RETROGRADE PYELOGRAM (Right)  Patient Location: PACU  Anesthesia Type: General  Level of Consciousness: awake, oriented, sedated and patient cooperative  Airway & Oxygen Therapy: Patient Spontanous Breathing and Patient connected to face mask oxygen  Post-op Assessment: Report given to PACU RN and Post -op Vital signs reviewed and stable  Post vital signs: Reviewed and stable  Complications: No apparent anesthesia complications Last Vitals:  Vitals Value Taken Time  BP 114/65 10/21/18 1200  Temp    Pulse 63 10/21/18 1206  Resp 14 10/21/18 1206  SpO2 100 % 10/21/18 1206  Vitals shown include unvalidated device data.  Last Pain:  Vitals:   10/21/18 1047  TempSrc:   PainSc: 0-No pain      Patients Stated Pain Goal: 6 (10/21/18 1047)

## 2018-10-22 ENCOUNTER — Inpatient Hospital Stay: Payer: Medicare Other | Attending: Hematology

## 2018-10-22 ENCOUNTER — Other Ambulatory Visit: Payer: Self-pay

## 2018-10-22 ENCOUNTER — Telehealth: Payer: Self-pay | Admitting: Hematology

## 2018-10-22 ENCOUNTER — Inpatient Hospital Stay (HOSPITAL_BASED_OUTPATIENT_CLINIC_OR_DEPARTMENT_OTHER): Payer: Medicare Other | Admitting: Hematology

## 2018-10-22 ENCOUNTER — Encounter (HOSPITAL_BASED_OUTPATIENT_CLINIC_OR_DEPARTMENT_OTHER): Payer: Self-pay | Admitting: Urology

## 2018-10-22 VITALS — BP 111/65 | HR 58 | Temp 98.5°F | Resp 18 | Ht 69.0 in | Wt 171.5 lb

## 2018-10-22 DIAGNOSIS — C801 Malignant (primary) neoplasm, unspecified: Secondary | ICD-10-CM | POA: Insufficient documentation

## 2018-10-22 DIAGNOSIS — C8330 Diffuse large B-cell lymphoma, unspecified site: Secondary | ICD-10-CM | POA: Insufficient documentation

## 2018-10-22 DIAGNOSIS — N189 Chronic kidney disease, unspecified: Secondary | ICD-10-CM | POA: Insufficient documentation

## 2018-10-22 DIAGNOSIS — C7911 Secondary malignant neoplasm of bladder: Secondary | ICD-10-CM | POA: Insufficient documentation

## 2018-10-22 DIAGNOSIS — C8333 Diffuse large B-cell lymphoma, intra-abdominal lymph nodes: Secondary | ICD-10-CM | POA: Diagnosis not present

## 2018-10-22 LAB — CBC WITH DIFFERENTIAL/PLATELET
Abs Immature Granulocytes: 0.07 10*3/uL (ref 0.00–0.07)
Basophils Absolute: 0 10*3/uL (ref 0.0–0.1)
Basophils Relative: 0 %
Eosinophils Absolute: 0.1 10*3/uL (ref 0.0–0.5)
Eosinophils Relative: 1 %
HCT: 38 % — ABNORMAL LOW (ref 39.0–52.0)
Hemoglobin: 12.4 g/dL — ABNORMAL LOW (ref 13.0–17.0)
Immature Granulocytes: 1 %
Lymphocytes Relative: 12 %
Lymphs Abs: 1.6 10*3/uL (ref 0.7–4.0)
MCH: 31.4 pg (ref 26.0–34.0)
MCHC: 32.6 g/dL (ref 30.0–36.0)
MCV: 96.2 fL (ref 80.0–100.0)
Monocytes Absolute: 0.7 10*3/uL (ref 0.1–1.0)
Monocytes Relative: 5 %
Neutro Abs: 11 10*3/uL — ABNORMAL HIGH (ref 1.7–7.7)
Neutrophils Relative %: 81 %
Platelets: 208 10*3/uL (ref 150–400)
RBC: 3.95 MIL/uL — ABNORMAL LOW (ref 4.22–5.81)
RDW: 14 % (ref 11.5–15.5)
WBC: 13.4 10*3/uL — ABNORMAL HIGH (ref 4.0–10.5)
nRBC: 0 % (ref 0.0–0.2)

## 2018-10-22 LAB — CMP (CANCER CENTER ONLY)
ALT: 15 U/L (ref 0–44)
AST: 10 U/L — ABNORMAL LOW (ref 15–41)
Albumin: 3.5 g/dL (ref 3.5–5.0)
Alkaline Phosphatase: 54 U/L (ref 38–126)
Anion gap: 8 (ref 5–15)
BUN: 25 mg/dL — ABNORMAL HIGH (ref 8–23)
CO2: 20 mmol/L — ABNORMAL LOW (ref 22–32)
Calcium: 9.1 mg/dL (ref 8.9–10.3)
Chloride: 111 mmol/L (ref 98–111)
Creatinine: 2.01 mg/dL — ABNORMAL HIGH (ref 0.61–1.24)
GFR, Est AFR Am: 34 mL/min — ABNORMAL LOW (ref >60)
GFR, Estimated: 30 mL/min — ABNORMAL LOW (ref >60)
Glucose, Bld: 129 mg/dL — ABNORMAL HIGH (ref 70–99)
Potassium: 4.1 mmol/L (ref 3.5–5.1)
Sodium: 139 mmol/L (ref 135–145)
Total Bilirubin: 0.4 mg/dL (ref 0.3–1.2)
Total Protein: 6.4 g/dL — ABNORMAL LOW (ref 6.5–8.1)

## 2018-10-22 LAB — LACTATE DEHYDROGENASE: LDH: 88 U/L — ABNORMAL LOW (ref 98–192)

## 2018-10-22 NOTE — Anesthesia Postprocedure Evaluation (Signed)
Anesthesia Post Note  Patient: Jonathan Marshall  Procedure(s) Performed: TRANSURETHRAL RESECTION OF BLADDER TUMOR (TURBT) (N/A Bladder) CYSTOSCOPY WITH RETROGRADE PYELOGRAM (Right Renal)     Patient location during evaluation: PACU Anesthesia Type: General Level of consciousness: awake and alert Pain management: pain level controlled Vital Signs Assessment: post-procedure vital signs reviewed and stable Respiratory status: spontaneous breathing, nonlabored ventilation, respiratory function stable and patient connected to nasal cannula oxygen Cardiovascular status: blood pressure returned to baseline and stable Postop Assessment: no apparent nausea or vomiting Anesthetic complications: no    Last Vitals:  Vitals:   10/21/18 1245 10/21/18 1320  BP:  126/77  Pulse: (!) 58 (!) 55  Resp: 19 18  Temp:  36.5 C  SpO2: 100% 100%    Last Pain:  Vitals:   10/21/18 1320  TempSrc:   PainSc: 0-No pain                 Clee Pandit

## 2018-10-22 NOTE — Telephone Encounter (Signed)
Scheduled appt per 8/27 los. ° °Printed calendar and avs. °

## 2019-02-08 ENCOUNTER — Telehealth: Payer: Self-pay | Admitting: Hematology

## 2019-02-08 NOTE — Telephone Encounter (Signed)
Emmet PAL 12/28 moved lab/fu to 1/20. Confirmed with patient.

## 2019-02-23 ENCOUNTER — Other Ambulatory Visit: Payer: Medicare Other

## 2019-02-23 ENCOUNTER — Ambulatory Visit: Payer: Medicare Other | Admitting: Hematology

## 2019-03-17 ENCOUNTER — Inpatient Hospital Stay: Payer: Medicare Other | Attending: Hematology

## 2019-03-17 ENCOUNTER — Inpatient Hospital Stay (HOSPITAL_BASED_OUTPATIENT_CLINIC_OR_DEPARTMENT_OTHER): Payer: Medicare Other | Admitting: Hematology

## 2019-03-17 ENCOUNTER — Other Ambulatory Visit: Payer: Self-pay

## 2019-03-17 VITALS — BP 114/66 | HR 60 | Temp 98.2°F | Resp 17 | Ht 69.0 in | Wt 163.8 lb

## 2019-03-17 DIAGNOSIS — Z9221 Personal history of antineoplastic chemotherapy: Secondary | ICD-10-CM | POA: Diagnosis not present

## 2019-03-17 DIAGNOSIS — C8333 Diffuse large B-cell lymphoma, intra-abdominal lymph nodes: Secondary | ICD-10-CM

## 2019-03-17 DIAGNOSIS — C689 Malignant neoplasm of urinary organ, unspecified: Secondary | ICD-10-CM | POA: Diagnosis not present

## 2019-03-17 DIAGNOSIS — Z85038 Personal history of other malignant neoplasm of large intestine: Secondary | ICD-10-CM | POA: Insufficient documentation

## 2019-03-17 DIAGNOSIS — C768 Malignant neoplasm of other specified ill-defined sites: Secondary | ICD-10-CM | POA: Diagnosis not present

## 2019-03-17 DIAGNOSIS — R7402 Elevation of levels of lactic acid dehydrogenase (LDH): Secondary | ICD-10-CM

## 2019-03-17 DIAGNOSIS — C189 Malignant neoplasm of colon, unspecified: Secondary | ICD-10-CM | POA: Insufficient documentation

## 2019-03-17 DIAGNOSIS — C8590 Non-Hodgkin lymphoma, unspecified, unspecified site: Secondary | ICD-10-CM | POA: Insufficient documentation

## 2019-03-17 LAB — LACTATE DEHYDROGENASE: LDH: 460 U/L — ABNORMAL HIGH (ref 98–192)

## 2019-03-17 LAB — CBC WITH DIFFERENTIAL/PLATELET
Abs Immature Granulocytes: 0.03 10*3/uL (ref 0.00–0.07)
Basophils Absolute: 0 10*3/uL (ref 0.0–0.1)
Basophils Relative: 0 %
Eosinophils Absolute: 0.4 10*3/uL (ref 0.0–0.5)
Eosinophils Relative: 4 %
HCT: 38.6 % — ABNORMAL LOW (ref 39.0–52.0)
Hemoglobin: 12.5 g/dL — ABNORMAL LOW (ref 13.0–17.0)
Immature Granulocytes: 0 %
Lymphocytes Relative: 13 %
Lymphs Abs: 1.3 10*3/uL (ref 0.7–4.0)
MCH: 32.1 pg (ref 26.0–34.0)
MCHC: 32.4 g/dL (ref 30.0–36.0)
MCV: 99.2 fL (ref 80.0–100.0)
Monocytes Absolute: 0.9 10*3/uL (ref 0.1–1.0)
Monocytes Relative: 9 %
Neutro Abs: 7.3 10*3/uL (ref 1.7–7.7)
Neutrophils Relative %: 74 %
Platelets: 229 10*3/uL (ref 150–400)
RBC: 3.89 MIL/uL — ABNORMAL LOW (ref 4.22–5.81)
RDW: 13.2 % (ref 11.5–15.5)
WBC: 10 10*3/uL (ref 4.0–10.5)
nRBC: 0 % (ref 0.0–0.2)

## 2019-03-17 LAB — CMP (CANCER CENTER ONLY)
ALT: 15 U/L (ref 0–44)
AST: 17 U/L (ref 15–41)
Albumin: 3.5 g/dL (ref 3.5–5.0)
Alkaline Phosphatase: 78 U/L (ref 38–126)
Anion gap: 7 (ref 5–15)
BUN: 25 mg/dL — ABNORMAL HIGH (ref 8–23)
CO2: 21 mmol/L — ABNORMAL LOW (ref 22–32)
Calcium: 9 mg/dL (ref 8.9–10.3)
Chloride: 110 mmol/L (ref 98–111)
Creatinine: 2.03 mg/dL — ABNORMAL HIGH (ref 0.61–1.24)
GFR, Est AFR Am: 34 mL/min — ABNORMAL LOW (ref >60)
GFR, Estimated: 29 mL/min — ABNORMAL LOW (ref >60)
Glucose, Bld: 141 mg/dL — ABNORMAL HIGH (ref 70–99)
Potassium: 5 mmol/L (ref 3.5–5.1)
Sodium: 138 mmol/L (ref 135–145)
Total Bilirubin: 0.4 mg/dL (ref 0.3–1.2)
Total Protein: 7.1 g/dL (ref 6.5–8.1)

## 2019-03-17 NOTE — Progress Notes (Signed)
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 male who has been referred to [place] by urologist Dr Alexis Frock for evaluation and management of newly diagnosed diffuse large B Cell Lymphoma. He is accompanied today by his wife. The pt reports that he is doing well overall.   The pt reports that in 1993 he had colon cancer with two tumors in the ascending and descending colon, which was picked up from a routine physical. He notes that he was treated with chemotherapy with Levamisole and Leukovorin for 6 months. He has not had any issues with the colon cancer since. He continue to see Dr. Cristina Gong for his GI.   He notes that he has had blood in the urine for a year and a half, picked up in a physical. This prompted his TURP surgery in July 2018. He notes that the bleeding persisted after the surgery. He notes that a stent was placed which has significantly reduced his hematuria. In February 2019 his left uretral mass was identified and subsequently biopsied confirming adenocarcinoma. The pt notes that Dr. Tresa Moore has up to this point been considering removing one of his kidneys, which would have a bearing on our treatment.   A 04/21/17 CT Abdomen identified a right retroperitoneal mass which was biopsied on 05/08/17 which revealed Large B-Cell Lymphoma.   He notes no ongoing health problems and is without physical limitations. He denies having had any blood transfusions.   On 04/23/17 the pt had a Uretral biopsy revealing Ureter, biopsy, Left mass- SUPERFICIAL FRAGMENTS OF UROTHELIAL CARCINOMA ASSOCIATED WITH GRANULATION TISSUE AND NECROSIS.   CT Abdomen of 04/21/17 revealed large right para-aortic retroperitoneal nodal  conglomeration in the abdomen, just inferior to the right renal artery. Imaging features compatible with metastatic disease in this patient with a history of prostate and left urothelial cancer. 2. Aortic atherosclerosis.   Soft Tissue Needle/core biopsy completed on 05/08/17 with results revealing Large B-Cell Lymphoma.   Most recent lab results (05/08/17) of CBC  is as follows: all values are WNL except for WBC at 11.3k, Monocytes Abs at 1.1k, CO2 at 19, Glucose at 109, Creatinine at 1.54, ALT at 16.  On review of systems, pt reports infrequent hematuria, mild back pain associated with golfing, and denies leg swelling, flank pain, fevers, chills, night sweats and any other symptoms.   On PMHx the pt reports Colon cancer in 1993, hypercholesterolemia, glaucoma, low risk prostate cancer, mastoiditis, hernia repair, carpel tunnel. He denies heart or lung problems.  On Social Hx the pt denies smoking and ETOH consumption. On Family Hx the pt reports that his mother had breast cancer when she was roughly 47, and also cervical cancer. On his mother's side, his aunts and uncles had several cancers including lung. Maternal grandfather had prostate cancer.   Interval History:  Jonathan Marshall returns today for management and evaluation of his Diffuse Large B-Cell Lymphoma and Urothelial carcinoma. [name] was on 10/22/2018. The pt reports that he is doing well overall.  The pt reports that his energy has been low but his fatigue is not so great that he can not continue his daily activities. He has been walking about a mile and  a half daily. His sense of taste has also been off, which is affecting his appetite. He has lost some weight since our last visit. Pt notes that he has stopped snacking at night and has not been eating full-balanced meals. Pt also does not want to gain weight again. He was having some difficulty controlling his bowel movements and he was given Oxybutynin by Dr.  Collene Mares. He is no longer having this issue.  Pt is scheduled to receive the first dose of COVID19 vaccine this afternoon.   Lab results today (03/17/19) of CBC w/diff and CMP is as follows: all values are WNL except for RBC at 3.89, Hgb at 12.5, HCT at 38.6, CO2 at 21, Glucose at 141, BUN at 25, Creatinine at 2.03, GFR Est Non Af Am at 29. 03/16/2018 LDH at 460   On review of systems, pt reports fatigue, dysgeusia, low appetite, weight loss and denies abdominal pain, fevers, chills, night sweats, dysuria, hematuria, constipation, problems swallowing and any other symptoms.    MEDICAL HISTORY:  Past Medical History:  Diagnosis Date  . BPH (benign prostatic hyperplasia)   . CKD (chronic kidney disease), stage III ([place])   . DDD (degenerative disc disease), lumbar    L4-L5  . Depression   . Diffuse large B cell lymphoma [facility]) oncologist-- dr Irene Limbo   dx 03/ 2019---  bx right retroperitoneal mass, Stage II,  completed radiation 03-19-2018  . Early stage glaucoma    both eyes  . History of adenomatous polyp of colon   . History of cancer chemotherapy    completed 08-27-2017  . History of colon cancer followed by GI-- dr Cristina Gong   dx 1993  s/p  hemicolectomy (malignant tumor x2)  and chemo therapy completed 1993--- (10-20-2018 no recurrence per pt)  . History of radiation therapy 02/24/18- 03/19/18   Abdomen, 1.8 Gy in 17 fractions for a total dose of 30.6 Gy.   [name] Kitchen Hyperlipidemia   . Recurrent malignant neoplasm of bladder [facility]) urologist-- dr Tresa Moore   first dx 10/ 2019  s/p  TURBT  . Right-sided sensorineural hearing loss   . Solitary right kidney    acquired--- s/p  left nephroureterectomy 09-24-2017  . Urothelial carcinoma of left distal ureter [facility]) oncologist-- dr Irene Limbo   dx 02/ 2019  --   chemo completed 07/ 2019;  s/p  left nephroureterectomy 09-24-2017,      SURGICAL HISTORY: Past Surgical History:  Procedure Laterality Date  . APPENDECTOMY    . CARPAL TUNNEL RELEASE Left 08/13/2013    Procedure: CARPAL TUNNEL RELEASE LEFT WRIST;  Surgeon: Yvette Rack., MD;  Location: [place];  Service: Orthopedics;  Laterality: Left;  . CATARACT EXTRACTION W/ INTRAOCULAR LENS  IMPLANT, BILATERAL  07/2018  . CYSTOSCOPY W/ RETROGRADES Bilateral 04/03/2017   Procedure: CYSTOSCOPY WITH RETROGRADE PYELOGRAM, LEFT URETEROSCOPY, LEFT STENT PLACEMENT;  Surgeon: Franchot Gallo, MD;  Location: [facility];  Service: Urology;  Laterality: Bilateral;  . CYSTOSCOPY W/ RETROGRADES Bilateral 12/12/2017   Procedure: CYSTOSCOPY WITH RETROGRADE PYELOGRAM;  Surgeon: Alexis Frock, MD;  Location: [facility];  Service: Urology;  Laterality: Bilateral;  . CYSTOSCOPY W/ RETROGRADES Right 10/21/2018   Procedure: CYSTOSCOPY WITH RETROGRADE PYELOGRAM;  Surgeon: Alexis Frock, MD;  Location: [facility];  Service: Urology;  Laterality: Right;  . CYSTOSCOPY W/ URETERAL STENT PLACEMENT Left 04/23/2017   Procedure: CYSTOSCOPY WITH STENT REPLACEMENT;  Surgeon: Alexis Frock, MD;  Location[facility];  Service:  Urology;  Laterality: Left;  . CYSTOSCOPY WITH FULGERATION Left 04/03/2017   Procedure: LEFT URETERAL BIOPSY;  Surgeon: Franchot Gallo, MD;  Location: [facility];  Service: Urology;  Laterality: Left;  . CYSTOSCOPY/RETROGRADE/URETEROSCOPY Left 04/23/2017   Procedure: CYSTOSCOPY/RETROGRADE/URETEROSCOPY, LEFT  URETEROSCOPY WITY  BIOPSY;  Surgeon: Alexis Frock, MD;  Location: [facility];  Service: Urology;  Laterality: Left;  . HEMICOLECTOMY  1993   malignant tumor x2 , colon cancer and Appendectomy  . INGUINAL HERNIA REPAIR Right 2003  . IR FLUORO GUIDE PORT INSERTION RIGHT  05/30/2017  . IR REMOVAL TUN ACCESS W/ PORT W/O FL MOD SED  07/06/2018  . IR US GUIDE VASC ACCESS RIGHT  05/30/2017  . MASTOIDECTOMY Right 2015  . ROBOT ASSITED LAPAROSCOPIC NEPHROURETERECTOMY Left 09/24/2017   Procedure: XI  ROBOT ASSITED LAPAROSCOPIC NEPHROURETERECTOMY;  Surgeon: Alexis Frock, MD;  Location: [facility];  Service: Urology;  Laterality: Left;  . ROBOTIC ASSISTED LAPAROSCOPIC LYSIS OF ADHESION  09/24/2017   Procedure: XI ROBOTIC ASSISTED LAPAROSCOPIC EXTENSIVE LYSIS OF ADHESION;  Surgeon: Alexis Frock, MD;  Location: [facility];  Service: Urology;;  . Lonell Face  last one 06/ 2018  . TRANSURETHRAL RESECTION OF BLADDER TUMOR N/A 12/12/2017   Procedure: TRANSURETHRAL RESECTION OF BLADDER TUMOR (TURBT);  Surgeon: Alexis Frock, MD;  Location: [facility];  Service: Urology;  Laterality: N/A;  . TRANSURETHRAL RESECTION OF BLADDER TUMOR N/A 10/21/2018   Procedure: TRANSURETHRAL RESECTION OF BLADDER TUMOR (TURBT);  Surgeon: Alexis Frock, MD;  Location: [facility];  Service: Urology;  Laterality: N/A;  . TRANSURETHRAL RESECTION OF PROSTATE N/A 09/02/2016   Procedure: TRANSURETHRAL RESECTION OF THE PROSTATE (TURP);  Surgeon: Franchot Gallo, MD;  Location: [facility];  Service: Urology;  Laterality: N/A;    SOCIAL HISTORY: Social History   Socioeconomic History  . Marital status: Married    Spouse name: Not on file  . Number of children: Not on file  . Years of education: Not on file  . Highest education level: Not on file  Occupational History  . Not on file  Tobacco Use  . Smoking status: Never Smoker  . Smokeless tobacco: Never Used  Substance and Sexual Activity  . Alcohol use: Yes    Alcohol/week: 1.0 standard drinks    Types: 1 Glasses of wine per week    Comment: rare  . Drug use: Never  . Sexual activity: Not Currently  Other Topics Concern  . Not on file  Social History Narrative  . Not on file   Social Determinants of Health   Financial Resource Strain:   . Difficulty of Paying Living Expenses: Not on file  Food Insecurity:   . Worried About Charity fundraiser in the Last Year: Not on file  . Ran Out of Food in the Last  Year: Not on file  Transportation Needs:   . Lack of Transportation (Medical): Not on file  . Lack of Transportation (Non-Medical): Not on file  Physical Activity:   . Days of Exercise per Week: Not on file  . Minutes of Exercise per Session: Not on file  Stress:   . Feeling of Stress : Not on file  Social Connections:   . Frequency of Communication with Friends and Family: Not on file  . Frequency of Social Gatherings with Friends and Family: Not on file  . Attends Religious Services: Not on file  . Active Member of Clubs or Organizations: Not on file  . Attends  Club or Organization Meetings: Not on file  . Marital Status: Not on file  Intimate Partner Violence:   . Fear of Current or Ex-Partner: Not on file  . Emotionally Abused: Not on file  . Physically Abused: Not on file  . Sexually Abused: Not on file    FAMILY HISTORY: No family history on file.  ALLERGIES:  has No Known Allergies.  MEDICATIONS:  Current Outpatient Medications  Medication Sig Dispense Refill  . fluticasone (FLONASE) 50 MCG/ACT nasal spray Place 1 spray into both nostrils as needed.     . latanoprost (XALATAN) 0.005 % ophthalmic solution Place 1 drop into both eyes at bedtime.    . lovastatin (MEVACOR) 40 MG tablet Take 40 mg by mouth every evening.    [name] Kitchen oxybutynin (DITROPAN-XL) 5 MG 24 hr tablet Take 5 mg by mouth at bedtime.    . sertraline (ZOLOFT) 25 MG tablet Take 25 mg by mouth every morning.     . timolol (TIMOPTIC) 0.5 % ophthalmic solution Place 1 drop into both eyes daily.     . traMADol (ULTRAM) 50 MG tablet Take 1 tablet (50 mg total) by mouth every 6 (six) hours as needed for moderate pain or severe pain. Post-operatively (Patient not taking: Reported on 03/17/2019) 10 tablet 0   No current facility-administered medications for this visit.    REVIEW OF SYSTEMS:   A 10+ POINT REVIEW OF SYSTEMS WAS OBTAINED including neurology, dermatology, psychiatry, cardiac, respiratory, lymph,  extremities, GI, GU, Musculoskeletal, constitutional, breasts, reproductive, HEENT.  All pertinent positives are noted in the HPI.  All others are negative.   PHYSICAL EXAMINATION: ECOG PERFORMANCE STATUS: 1 - Symptomatic but completely ambulatory  Vitals:   03/17/19 1331  BP: 114/66  Pulse: 60  Resp: 17  Temp: 98.2 F (36.8 C)  SpO2: 100%   Filed Weights   03/17/19 1331  Weight: 163 lb 12.8 oz (74.3 kg)   .Body mass index is 24.19 kg/m.  GENERAL:alert, in no acute distress and comfortable SKIN: no acute rashes, no significant lesions EYES: conjunctiva are pink and non-injected, sclera anicteric OROPHARYNX: MMM, no exudates, no oropharyngeal erythema or ulceration NECK: supple, no JVD LYMPH:  no palpable lymphadenopathy in the cervical, axillary or inguinal regions LUNGS: clear to auscultation b/l with normal respiratory effort HEART: regular rate & rhythm ABDOMEN:  normoactive bowel sounds , non tender, not distended. No palpable hepatosplenomegaly.  Extremity: no pedal edema PSYCH: alert & oriented x 3 with fluent speech NEURO: no focal motor/sensory deficits  LABORATORY DATA:  I have reviewed the data as listed  . CBC Latest Ref Rng & Units 03/17/2019 10/22/2018 10/21/2018  WBC 4.0 - 10.5 K/uL 10.0 13.4(H) -  Hemoglobin 13.0 - 17.0 g/dL 12.5(L) 12.4(L) 15.3  Hematocrit 39.0 - 52.0 % 38.6(L) 38.0(L) 45.0  Platelets 150 - 400 K/uL 229 208 -    . CMP Latest Ref Rng & Units 03/17/2019 10/22/2018 10/21/2018  Glucose 70 - 99 mg/dL 141(H) 129(H) 89  BUN 8 - 23 mg/dL 25(H) 25(H) 28(H)  Creatinine 0.61 - 1.24 mg/dL 2.03(H) 2.01(H) 2.00(H)  Sodium 135 - 145 mmol/L 138 139 141  Potassium 3.5 - 5.1 mmol/L 5.0 4.1 5.0  Chloride 98 - 111 mmol/L 110 111 110  CO2 22 - 32 mmol/L 21(L) 20(L) -  Calcium 8.9 - 10.3 mg/dL 9.0 9.1 -  Total Protein 6.5 - 8.1 g/dL 7.1 6.4(L) -  Total Bilirubin 0.3 - 1.2 mg/dL 0.4 0.4 -  Alkaline Phos 38 - 126  U/L 78 54 -  AST 15 - 41 U/L 17 10(L) -    ALT 0 - 44 U/L 15 15 -    Component     Latest Ref Rng & Units 05/15/2017  Retic Ct Pct     0.8 - 1.8 % 1.6  RBC.     4.20 - 5.82 MIL/uL 4.62  Retic Count, Absolute     34.8 - 93.9 K/uL 73.9  Prothrombin Time     11.4 - 15.2 seconds 14.5  INR      1.13  APTT     24 - 36 seconds 31  HIV Screen 4th Generation wRfx     Non Reactive Non Reactive  HCV Ab     0.0 - 0.9 s/co ratio <0.1  Hep B Core Ab, Tot     Negative Negative  Hepatitis B Surface Ag     Negative Negative  LDH     125 - 245 U/L 132   . Lab Results  Component Value Date   LDH 460 (H) 03/17/2019   12/12/17 Biopsy:    05/08/17 Soft Tissue Needle Core Biopsy:   04/23/17 Ureter Biopsy:  10/21/2018 Surgical Pathology   RADIOGRAPHIC STUDIES: I have personally reviewed the radiological images as listed and agreed with the findings in the report. No results found. 04/21/17 CT Abdomen  ASSESSMENT & PLAN:   86 y.o. male with  1. Diffuse Large B Cell Lymphoma - Stage II -bulky  05/26/17 PET/CT which showed 1. 5 cm right-sided retroperitoneal nodal mass is hypermetabolic and consistent with known lymphoma. Small lymph node just below the aortic bifurcation is also hypermetabolic. -ECHO shows normal EF.  S/p 4 cycles of R-mini CHOP  07/28/17 PET/CT which revealed Significant response to interval therapy. The large retroperitoneal mass is significantly smaller with minimal residual activity, slightly greater than blood pool ([place] 3). No evidence of disease progression.  12/26/17 PET/CT revealed No evidence of lymphoma recurrence on FDG PET scan. 2. Continued decrease in size and metabolic activity retroperitoneal periaortic mass. (De[place] 2) 3. LEFT nephrectomy.  S/p 30.6 Gy in 17 fractions completed on 03/19/18 with Dr. Isidore Moos  2. 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  -s/p left nephroureterectomy pm 09/24/2017 -continue  urologic f/u with Dr Tresa Moore.  3. S/p post operative Acute UTI with E.coli with sepsis and dehydration 4.s/p  AKI on CKD- single kidney. - likely due to poor po intake- creatinine improved with IVF 5. Protein calorie malnutrition-moderate 6. 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 S/p TURB tumor on 12/12/17 with Dr. Tresa Moore in urology  PLAN -Discussed pt labwork today, 03/17/19; blood counts and chemistries are steady  -Discussed 03/16/2018 LDH is greatly elevated at 460  -There is concern for progression of his DLBCL or his urothelial cancer based on weight loss and quickly elevating LDH levels -Avoid NSAIDs, infections and dehydration to protect kidneys. Tylenol if necessary  -Recommended pt f/u with first dose of COVID19 vaccine as scheduled -Will get PET/CT in 1-2 weeks   FOLLOW UP: PET/CT in 1-2 week RTC with Dr. Irene Limbo in 3 weeks   The total time spent in the appt was 30 minutes and more than 50% was on counseling and direct patient cares.  All of the patient's questions were answered with apparent  satisfaction. The patient knows to call the clinic with any problems, questions or concerns.   Sullivan Lone MD [place] AAHIVMS [facility] Hematology/Oncology Physician [facility]  (Office):       [phone] (Work cell):  [phone] (Fax):           [phone]  03/17/2019 4:41 PM  I, Yevette Edwards, am acting as a scribe for Dr. Sullivan Lone.   .I have reviewed the above documentation for accuracy and completeness, and I agree with the above. Brunetta Genera MD

## 2019-03-18 ENCOUNTER — Telehealth: Payer: Self-pay | Admitting: Hematology

## 2019-03-18 NOTE — Telephone Encounter (Signed)
Scheduled appt per 1/20 los.  Sent a message to HIM pool to get a calendar mailed out. 

## 2019-03-29 ENCOUNTER — Telehealth: Payer: Self-pay | Admitting: *Deleted

## 2019-03-29 NOTE — Telephone Encounter (Signed)
Wife asks for Marinol Rx to stimulate appetite since he has lost weight. States he had in past and it worked well. Dr. Irene Limbo informed. Dr. Irene Limbo response: would like to look at PET/CT to place his weight loss in perspective prior to restarting Marinol. Specifically approved for cancer and cancer treatment related anorexia and weight loss. Contacted patient with this information. He verbalized understanding.

## 2019-03-29 NOTE — Telephone Encounter (Signed)
-----   Message from Brunetta Genera, MD sent at 03/23/2019 10:47 PM EST ----- Plz let patient know his LDH level shows significant elevation from 88 to 460. This with weight loss is concerning and we would recommend rpt PET/CT in about 1 week to reassess his lymphoma and f/u with [place] in 2 weeks with rpt labs. Will need scheduling msg. thx

## 2019-03-29 NOTE — Telephone Encounter (Signed)
Jonathan Marshall states patient has scan scheduled on 2/11. Informed that LDH is elevated and that Dr. Irene Limbo wanted to see patient 2 weeks after last appt and following scan. Schedule message sent for appts week of 2/15. She also asked if patient could be prescribed Marinol to help his appetite. States he's had it in the past and it helped. Dr. Irene Limbo informed of request

## 2019-04-01 ENCOUNTER — Telehealth: Payer: Self-pay | Admitting: Hematology

## 2019-04-01 NOTE — Telephone Encounter (Signed)
Scheduled appt per 2/1 sch message - pt wife aware of appt date and time

## 2019-04-06 ENCOUNTER — Other Ambulatory Visit: Payer: Self-pay | Admitting: Hematology

## 2019-04-06 DIAGNOSIS — R7402 Elevation of levels of lactic acid dehydrogenase (LDH): Secondary | ICD-10-CM

## 2019-04-06 DIAGNOSIS — C8333 Diffuse large B-cell lymphoma, intra-abdominal lymph nodes: Secondary | ICD-10-CM

## 2019-04-06 DIAGNOSIS — C689 Malignant neoplasm of urinary organ, unspecified: Secondary | ICD-10-CM

## 2019-04-06 NOTE — Progress Notes (Unsigned)
PET/CT denied by insurance company. CT chest/abd/pelvis ordered without contrast

## 2019-04-08 ENCOUNTER — Ambulatory Visit (HOSPITAL_COMMUNITY): Payer: Medicare Other

## 2019-04-12 ENCOUNTER — Ambulatory Visit (HOSPITAL_COMMUNITY)
Admission: RE | Admit: 2019-04-12 | Discharge: 2019-04-12 | Disposition: A | Discharge status: Home / Self Care | Payer: Medicare Other | Source: Ambulatory Visit | Attending: Hematology | Admitting: Hematology

## 2019-04-12 ENCOUNTER — Other Ambulatory Visit: Payer: Self-pay

## 2019-04-12 ENCOUNTER — Encounter (HOSPITAL_COMMUNITY): Payer: Self-pay

## 2019-04-12 DIAGNOSIS — C8333 Diffuse large B-cell lymphoma, intra-abdominal lymph nodes: Secondary | ICD-10-CM | POA: Diagnosis present

## 2019-04-12 DIAGNOSIS — R7402 Elevation of levels of lactic acid dehydrogenase (LDH): Secondary | ICD-10-CM | POA: Insufficient documentation

## 2019-04-12 DIAGNOSIS — C689 Malignant neoplasm of urinary organ, unspecified: Secondary | ICD-10-CM | POA: Insufficient documentation

## 2019-04-12 HISTORY — DX: Malignant neoplasm of colon, unspecified: C18.9

## 2019-04-13 ENCOUNTER — Inpatient Hospital Stay: Payer: Medicare Other

## 2019-04-13 ENCOUNTER — Inpatient Hospital Stay: Payer: Medicare Other | Attending: Hematology | Admitting: Hematology

## 2019-04-13 ENCOUNTER — Other Ambulatory Visit: Payer: Self-pay

## 2019-04-13 VITALS — BP 104/75 | HR 74 | Temp 98.2°F | Resp 18 | Ht 69.0 in | Wt 159.7 lb

## 2019-04-13 DIAGNOSIS — C689 Malignant neoplasm of urinary organ, unspecified: Secondary | ICD-10-CM | POA: Diagnosis not present

## 2019-04-13 DIAGNOSIS — C787 Secondary malignant neoplasm of liver and intrahepatic bile duct: Secondary | ICD-10-CM | POA: Insufficient documentation

## 2019-04-13 DIAGNOSIS — R7402 Elevation of levels of lactic acid dehydrogenase (LDH): Secondary | ICD-10-CM

## 2019-04-13 DIAGNOSIS — C8333 Diffuse large B-cell lymphoma, intra-abdominal lymph nodes: Secondary | ICD-10-CM | POA: Diagnosis not present

## 2019-04-13 DIAGNOSIS — K769 Liver disease, unspecified: Secondary | ICD-10-CM | POA: Diagnosis not present

## 2019-04-13 DIAGNOSIS — C662 Malignant neoplasm of left ureter: Secondary | ICD-10-CM | POA: Diagnosis not present

## 2019-04-13 DIAGNOSIS — C8339 Diffuse large B-cell lymphoma, extranodal and solid organ sites: Secondary | ICD-10-CM | POA: Insufficient documentation

## 2019-04-13 LAB — CMP (CANCER CENTER ONLY)
ALT: 18 U/L (ref 0–44)
AST: 21 U/L (ref 15–41)
Albumin: 3.2 g/dL — ABNORMAL LOW (ref 3.5–5.0)
Alkaline Phosphatase: 78 U/L (ref 38–126)
Anion gap: 9 (ref 5–15)
BUN: 27 mg/dL — ABNORMAL HIGH (ref 8–23)
CO2: 20 mmol/L — ABNORMAL LOW (ref 22–32)
Calcium: 9.5 mg/dL (ref 8.9–10.3)
Chloride: 109 mmol/L (ref 98–111)
Creatinine: 2.08 mg/dL — ABNORMAL HIGH (ref 0.61–1.24)
GFR, Est AFR Am: 33 mL/min — ABNORMAL LOW (ref >60)
GFR, Estimated: 28 mL/min — ABNORMAL LOW (ref >60)
Glucose, Bld: 113 mg/dL — ABNORMAL HIGH (ref 70–99)
Potassium: 4.4 mmol/L (ref 3.5–5.1)
Sodium: 138 mmol/L (ref 135–145)
Total Bilirubin: 0.4 mg/dL (ref 0.3–1.2)
Total Protein: 7.1 g/dL (ref 6.5–8.1)

## 2019-04-13 LAB — CBC WITH DIFFERENTIAL/PLATELET
Abs Immature Granulocytes: 0.06 10*3/uL (ref 0.00–0.07)
Basophils Absolute: 0 10*3/uL (ref 0.0–0.1)
Basophils Relative: 0 %
Eosinophils Absolute: 0.6 10*3/uL — ABNORMAL HIGH (ref 0.0–0.5)
Eosinophils Relative: 5 %
HCT: 35.4 % — ABNORMAL LOW (ref 39.0–52.0)
Hemoglobin: 11.4 g/dL — ABNORMAL LOW (ref 13.0–17.0)
Immature Granulocytes: 1 %
Lymphocytes Relative: 10 %
Lymphs Abs: 1.1 10*3/uL (ref 0.7–4.0)
MCH: 31.8 pg (ref 26.0–34.0)
MCHC: 32.2 g/dL (ref 30.0–36.0)
MCV: 98.9 fL (ref 80.0–100.0)
Monocytes Absolute: 1.1 10*3/uL — ABNORMAL HIGH (ref 0.1–1.0)
Monocytes Relative: 10 %
Neutro Abs: 8.4 10*3/uL — ABNORMAL HIGH (ref 1.7–7.7)
Neutrophils Relative %: 74 %
Platelets: 243 10*3/uL (ref 150–400)
RBC: 3.58 MIL/uL — ABNORMAL LOW (ref 4.22–5.81)
RDW: 13.2 % (ref 11.5–15.5)
WBC: 11.4 10*3/uL — ABNORMAL HIGH (ref 4.0–10.5)
nRBC: 0 % (ref 0.0–0.2)

## 2019-04-13 LAB — LACTATE DEHYDROGENASE: LDH: 654 U/L — ABNORMAL HIGH (ref 98–192)

## 2019-04-13 MED ORDER — DRONABINOL 5 MG PO CAPS: 5.0000 mg | ORAL_CAPSULE | Freq: Two times a day (BID) | ORAL | 0 refills | Status: DC

## 2019-04-13 MED FILL — DRONABINOL 5 MG CAP: 5 | 30 days supply | Qty: 60 | Fill #0

## 2019-04-13 NOTE — Progress Notes (Signed)
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 male who has been referred to [place] by urologist Dr Alexis Frock for evaluation and management of newly diagnosed diffuse large B Cell Lymphoma. He is accompanied today by his wife. The pt reports that he is doing well overall.   The pt reports that in 1993 he had colon cancer with two tumors in the ascending and descending colon, which was picked up from a routine physical. He notes that he was treated with chemotherapy with Levamisole and Leukovorin for 6 months. He has not had any issues with the colon cancer since. He continue to see Dr. Cristina Gong for his GI.   He notes that he has had blood in the urine for a year and a half, picked up in a physical. This prompted his TURP surgery in July 2018. He notes that the bleeding persisted after the surgery. He notes that a stent was placed which has significantly reduced his hematuria. In February 2019 his left uretral mass was identified and subsequently biopsied confirming adenocarcinoma. The pt notes that Dr. Tresa Moore has up to this point been considering removing one of his kidneys, which would have a bearing on our treatment.   A 04/21/17 CT Abdomen identified a right retroperitoneal mass which was biopsied on 05/08/17 which revealed Large B-Cell Lymphoma.   He notes no ongoing health problems and is without physical limitations. He denies having had any blood transfusions.   On 04/23/17 the pt had a Uretral biopsy revealing Ureter, biopsy, Left mass- SUPERFICIAL FRAGMENTS OF UROTHELIAL CARCINOMA ASSOCIATED WITH GRANULATION TISSUE AND NECROSIS.   CT Abdomen of 04/21/17 revealed large right para-aortic retroperitoneal nodal  conglomeration in the abdomen, just inferior to the right renal artery. Imaging features compatible with metastatic disease in this patient with a history of prostate and left urothelial cancer. 2. Aortic atherosclerosis.   Soft Tissue Needle/core biopsy completed on 05/08/17 with results revealing Large B-Cell Lymphoma.   Most recent lab results (05/08/17) of CBC  is as follows: all values are WNL except for WBC at 11.3k, Monocytes Abs at 1.1k, CO2 at 19, Glucose at 109, Creatinine at 1.54, ALT at 16.  On review of systems, pt reports infrequent hematuria, mild back pain associated with golfing, and denies leg swelling, flank pain, fevers, chills, night sweats and any other symptoms.   On PMHx the pt reports Colon cancer in 1993, hypercholesterolemia, glaucoma, low risk prostate cancer, mastoiditis, hernia repair, carpel tunnel. He denies heart or lung problems.  On Social Hx the pt denies smoking and ETOH consumption. On Family Hx the pt reports that his mother had breast cancer when she was roughly 37, and also cervical cancer. On his mother's side, his aunts and uncles had several cancers including lung. Maternal grandfather had prostate cancer.   Interval History:  Jonathan Marshall returns today for management and evaluation of his Diffuse Large B-Cell Lymphoma and Urothelial carcinoma. We are joined today by his wife, Mrs. Mehrotra. [name] was on 03/17/2019. The pt reports that he is doing well overall.  The pt reports that he has been losing weight and has been increasingly weak and fatigued. His wife notes that pt has not  been eating well, but that in the past pt was able to eat better while using Marinol. Pt's sense of taste has also been off lately, making a lot of food unappetizing.   Of note since the patient's last visit, pt has had CT C/A/P [id number]) [id number]) completed on 04/12/2019 with results revealing "1. Interval development of multiple low-attenuation  lesions throughout both lobes of liver compatible with metastatic disease. 2. New soft tissue mass along the course of the left ureter compatible with recurrent tumor. 3. Small nonspecific lung nodules are stable from previous exam. 4. Coronary artery calcifications."  Lab results today (04/13/19) of CBC w/diff and CMP is as follows: all values are WNL except for WBC at 11.4K, RBC at 3.58, Hgb at 11.4, HCT at 35.4, Neutro Abs at 8.4K, Mono Abs at 1.1K, Eos Abs at 0.6K, CO2 at 20, Glucose at 113, BUN at 27, Creatinine at 2.08, Albumin at 3.2, GFR Est Non Af Am at 28. 04/13/2019 LDH at 654  On review of systems, pt reports unexpected weight loss, weakness, fatigue, low appetite, dysgeusia and denies abdominal discomfort, dysuria, bloody urine and any other symptoms.    MEDICAL HISTORY:  Past Medical History:  Diagnosis Date  . BPH (benign prostatic hyperplasia)   . CKD (chronic kidney disease), stage III   . Colon cancer ([place])    dx'd 1993  . DDD (degenerative disc disease), lumbar    L4-L5  . Depression   . Diffuse large B cell lymphoma [facility]) oncologist-- dr Irene Limbo   dx 03/ 2019---  bx right retroperitoneal mass, Stage II,  completed radiation 03-19-2018  . Early stage glaucoma    both eyes  . History of adenomatous polyp of colon   . History of cancer chemotherapy    completed 08-27-2017  . History of colon cancer followed by GI-- dr Cristina Gong   dx 1993  s/p  hemicolectomy (malignant tumor x2)  and chemo therapy completed 1993--- (10-20-2018 no recurrence per pt)  . History of radiation therapy 02/24/18- 03/19/18   Abdomen, 1.8 Gy in 17 fractions for a total dose of 30.6 Gy.   [name] Kitchen Hyperlipidemia   . Recurrent malignant neoplasm of bladder [facility]) urologist-- dr Tresa Moore   first dx 10/ 2019  s/p  TURBT  . Right-sided sensorineural hearing loss   . Solitary right kidney    acquired--- s/p  left nephroureterectomy 09-24-2017  . Urothelial carcinoma of left distal ureter [facility]) oncologist-- dr Irene Limbo    dx 02/ 2019  --   chemo completed 07/ 2019;  s/p  left nephroureterectomy 09-24-2017,      SURGICAL HISTORY: Past Surgical History:  Procedure Laterality Date  . APPENDECTOMY    . CARPAL TUNNEL RELEASE Left 08/13/2013   Procedure: CARPAL TUNNEL RELEASE LEFT WRIST;  Surgeon: Yvette Rack., MD;  Location: [place];  Service: Orthopedics;  Laterality: Left;  . CATARACT EXTRACTION W/ INTRAOCULAR LENS  IMPLANT, BILATERAL  07/2018  . CYSTOSCOPY W/ RETROGRADES Bilateral 04/03/2017   Procedure: CYSTOSCOPY WITH RETROGRADE PYELOGRAM, LEFT URETEROSCOPY, LEFT STENT PLACEMENT;  Surgeon: Franchot Gallo, MD;  Location: [facility];  Service: Urology;  Laterality: Bilateral;  . CYSTOSCOPY W/ RETROGRADES Bilateral 12/12/2017   Procedure: CYSTOSCOPY WITH RETROGRADE PYELOGRAM;  Surgeon: Alexis Frock, MD;  Location: [facility];  Service: Urology;  Laterality: Bilateral;  . CYSTOSCOPY W/ RETROGRADES Right 10/21/2018   Procedure: CYSTOSCOPY WITH RETROGRADE PYELOGRAM;  Surgeon: Alexis Frock, MD;  Location: [facility];  Service:  Urology;  Laterality: Right;  . CYSTOSCOPY W/ URETERAL STENT PLACEMENT Left 04/23/2017   Procedure: CYSTOSCOPY WITH STENT REPLACEMENT;  Surgeon: Alexis Frock, MD;  Location: [facility];  Service: Urology;  Laterality: Left;  . CYSTOSCOPY WITH FULGERATION Left 04/03/2017   Procedure: LEFT URETERAL BIOPSY;  Surgeon: Franchot Gallo, MD;  Location: [facility];  Service: Urology;  Laterality: Left;  . CYSTOSCOPY/RETROGRADE/URETEROSCOPY Left 04/23/2017   Procedure: CYSTOSCOPY/RETROGRADE/URETEROSCOPY, LEFT  URETEROSCOPY WITY  BIOPSY;  Surgeon: Alexis Frock, MD;  Location: [facility];  Service: Urology;  Laterality: Left;  . HEMICOLECTOMY  1993   malignant tumor x2 , colon cancer and Appendectomy  . INGUINAL HERNIA REPAIR Right 2003  . IR FLUORO GUIDE PORT INSERTION RIGHT   05/30/2017  . IR REMOVAL TUN ACCESS W/ PORT W/O FL MOD SED  07/06/2018  . IR US GUIDE VASC ACCESS RIGHT  05/30/2017  . MASTOIDECTOMY Right 2015  . ROBOT ASSITED LAPAROSCOPIC NEPHROURETERECTOMY Left 09/24/2017   Procedure: XI ROBOT ASSITED LAPAROSCOPIC NEPHROURETERECTOMY;  Surgeon: Alexis Frock, MD;  Location: [facility];  Service: Urology;  Laterality: Left;  . ROBOTIC ASSISTED LAPAROSCOPIC LYSIS OF ADHESION  09/24/2017   Procedure: XI ROBOTIC ASSISTED LAPAROSCOPIC EXTENSIVE LYSIS OF ADHESION;  Surgeon: Alexis Frock, MD;  Location: [facility];  Service: Urology;;  . Lonell Face  last one 06/ 2018  . TRANSURETHRAL RESECTION OF BLADDER TUMOR N/A 12/12/2017   Procedure: TRANSURETHRAL RESECTION OF BLADDER TUMOR (TURBT);  Surgeon: Alexis Frock, MD;  Location: [facility];  Service: Urology;  Laterality: N/A;  . TRANSURETHRAL RESECTION OF BLADDER TUMOR N/A 10/21/2018   Procedure: TRANSURETHRAL RESECTION OF BLADDER TUMOR (TURBT);  Surgeon: Alexis Frock, MD;  Location[facility];  Service: Urology;  Laterality: N/A;  . TRANSURETHRAL RESECTION OF PROSTATE N/A 09/02/2016   Procedure: TRANSURETHRAL RESECTION OF THE PROSTATE (TURP);  Surgeon: Franchot Gallo, MD;  Location: [facility];  Service: Urology;  Laterality: N/A;    SOCIAL HISTORY: Social History   Socioeconomic History  . Marital status: Married    Spouse name: Not on file  . Number of children: Not on file  . Years of education: Not on file  . Highest education level: Not on file  Occupational History  . Not on file  Tobacco Use  . Smoking status: Never Smoker  . Smokeless tobacco: Never Used  Substance and Sexual Activity  . Alcohol use: Yes    Alcohol/week: 1.0 standard drinks    Types: 1 Glasses of wine per week    Comment: rare  . Drug use: Never  . Sexual activity: Not Currently  Other Topics Concern  . Not on file  Social History Narrative  . Not on file   Social  Determinants of Health   Financial Resource Strain:   . Difficulty of Paying Living Expenses: Not on file  Food Insecurity:   . Worried About Charity fundraiser in the Last Year: Not on file  . Ran Out of Food in the Last Year: Not on file  Transportation Needs:   . Lack of Transportation (Medical): Not on file  . Lack of Transportation (Non-Medical): Not on file  Physical Activity:   . Days of Exercise per Week: Not on file  . Minutes of Exercise per Session: Not on file  Stress:   . Feeling of Stress : Not on file  Social Connections:   . Frequency of Communication with Friends and Family: Not on file  .  Frequency of Social Gatherings with Friends and Family: Not on file  . Attends Religious Services: Not on file  . Active Member of Clubs or Organizations: Not on file  . Attends Archivist Meetings: Not on file  . Marital Status: Not on file  Intimate Partner Violence:   . Fear of Current or Ex-Partner: Not on file  . Emotionally Abused: Not on file  . Physically Abused: Not on file  . Sexually Abused: Not on file    FAMILY HISTORY: No family history on file.  ALLERGIES:  has No Known Allergies.  MEDICATIONS:  Current Outpatient Medications  Medication Sig Dispense Refill  . fluticasone (FLONASE) 50 MCG/ACT nasal spray Place 1 spray into both nostrils as needed.     . latanoprost (XALATAN) 0.005 % ophthalmic solution Place 1 drop into both eyes at bedtime.    . lovastatin (MEVACOR) 40 MG tablet Take 40 mg by mouth every evening.    [name] Kitchen oxybutynin (DITROPAN-XL) 5 MG 24 hr tablet Take 5 mg by mouth at bedtime.    . sertraline (ZOLOFT) 25 MG tablet Take 25 mg by mouth every morning.     . timolol (TIMOPTIC) 0.5 % ophthalmic solution Place 1 drop into both eyes daily.     [name] Kitchen dronabinol (MARINOL) 5 MG capsule Take 1 capsule (5 mg total) by mouth 2 (two) times daily before a meal. 60 capsule 0  . traMADol (ULTRAM) 50 MG tablet Take 1 tablet (50 mg total) by mouth every  6 (six) hours as needed for moderate pain or severe pain. Post-operatively (Patient not taking: Reported on 03/17/2019) 10 tablet 0   No current facility-administered medications for this visit.    REVIEW OF SYSTEMS:   A 10+ POINT REVIEW OF SYSTEMS WAS OBTAINED including neurology, dermatology, psychiatry, cardiac, respiratory, lymph, extremities, GI, GU, Musculoskeletal, constitutional, breasts, reproductive, HEENT.  All pertinent positives are noted in the HPI.  All others are negative.   PHYSICAL EXAMINATION: ECOG PERFORMANCE STATUS: 1 - Symptomatic but completely ambulatory  Vitals:   04/13/19 1019  BP: 104/75  Pulse: 74  Resp: 18  Temp: 98.2 F (36.8 C)  SpO2: 99%   Filed Weights   04/13/19 1019  Weight: 159 lb 11.2 oz (72.4 kg)   .Body mass index is 23.58 kg/m.  GENERAL:alert, in no acute distress and comfortable SKIN: no acute rashes, no significant lesions EYES: conjunctiva are pink and non-injected, sclera anicteric OROPHARYNX: MMM, no exudates, no oropharyngeal erythema or ulceration NECK: supple, no JVD LYMPH:  no palpable lymphadenopathy in the cervical, axillary or inguinal regions LUNGS: clear to auscultation b/l with normal respiratory effort HEART: regular rate & rhythm ABDOMEN:  normoactive bowel sounds , non tender, not distended. No palpable Splenomegaly. Liver is about 2 fingerbreadths below the costal margin Extremity: no pedal edema PSYCH: alert & oriented x 3 with fluent speech NEURO: no focal motor/sensory deficits  LABORATORY DATA:  I have reviewed the data as listed  . CBC Latest Ref Rng & Units 04/13/2019 03/17/2019 10/22/2018  WBC 4.0 - 10.5 K/uL 11.4(H) 10.0 13.4(H)  Hemoglobin 13.0 - 17.0 g/dL 11.4(L) 12.5(L) 12.4(L)  Hematocrit 39.0 - 52.0 % 35.4(L) 38.6(L) 38.0(L)  Platelets 150 - 400 K/uL 243 229 208    . CMP Latest Ref Rng & Units 04/13/2019 03/17/2019 10/22/2018  Glucose 70 - 99 mg/dL 113(H) 141(H) 129(H)  BUN 8 - 23 mg/dL 27(H) 25(H)  25(H)  Creatinine 0.61 - 1.24 mg/dL 2.08(H) 2.03(H) 2.01(H)  Sodium 135 -  145 mmol/L 138 138 139  Potassium 3.5 - 5.1 mmol/L 4.4 5.0 4.1  Chloride 98 - 111 mmol/L 109 110 111  CO2 22 - 32 mmol/L 20(L) 21(L) 20(L)  Calcium 8.9 - 10.3 mg/dL 9.5 9.0 9.1  Total Protein 6.5 - 8.1 g/dL 7.1 7.1 6.4(L)  Total Bilirubin 0.3 - 1.2 mg/dL 0.4 0.4 0.4  Alkaline Phos 38 - 126 U/L 78 78 54  AST 15 - 41 U/L 21 17 10(L)  ALT 0 - 44 U/L 18 15 15     Component     Latest Ref Rng & Units 05/15/2017  Retic Ct Pct     0.8 - 1.8 % 1.6  RBC.     4.20 - 5.82 MIL/uL 4.62  Retic Count, Absolute     34.8 - 93.9 K/uL 73.9  Prothrombin Time     11.4 - 15.2 seconds 14.5  INR      1.13  APTT     24 - 36 seconds 31  HIV Screen 4th Generation wRfx     Non Reactive Non Reactive  HCV Ab     0.0 - 0.9 s/co ratio <0.1  Hep B Core Ab, Tot     Negative Negative  Hepatitis B Surface Ag     Negative Negative  LDH     125 - 245 U/L 132   . Lab Results  Component Value Date   LDH 654 (H) 04/13/2019   12/12/17 Biopsy:    05/08/17 Soft Tissue Needle Core Biopsy:   04/23/17 Ureter Biopsy:  10/21/2018 Surgical Pathology   RADIOGRAPHIC STUDIES: I have personally reviewed the radiological images as listed and agreed with the findings in the report. CT Abdomen Pelvis Wo Contrast  Result Date: 04/12/2019 CLINICAL DATA:  History of diffuse B-cell lymphoma with urothelial carcinoma of the renal pelvis. EXAM: CT CHEST, ABDOMEN AND PELVIS WITHOUT CONTRAST TECHNIQUE: Multidetector CT imaging of the chest, abdomen and pelvis was performed following the standard protocol without IV contrast. COMPARISON:  12/26/2017 FINDINGS: CT CHEST FINDINGS Cardiovascular: Normal heart size. Aortic atherosclerosis. Ascending thoracic aorta is ectatic measuring 4 cm in diameter, image 32/2. Lad and RCA coronary artery calcifications. Mediastinum/Nodes: No enlarged mediastinal, hilar, or axillary lymph nodes. Thyroid gland, trachea,  and esophagus demonstrate no significant findings. Lungs/Pleura: No pleural effusion. Superior segment left lower lobe perifissural nodule measures 3 mm, image 89/6. Previously 5 mm. 3 mm anterolateral left upper lobe lung nodule is unchanged, image 58/6. Musculoskeletal: No chest wall mass or suspicious bone lesions identified. CT ABDOMEN PELVIS FINDINGS Hepatobiliary: Interval development of multiple low-attenuation lesions throughout both lobes of liver highly concerning for metastatic disease. -index lesion within anterior dome measures 4.1 x 3.3 cm, image 53/2. -index lesion within posterior dome of liver measures 5.2 x 6.6 cm, image 56/2. -Index lesion within lateral segment measures 3.2 x 2.8 cm. -index inferior [place] lobe lesion measures 4.1 x 3.0 cm, image 77/2. Gallbladder negative. No biliary ductal dilatation. Pancreas: Unremarkable. No pancreatic ductal dilatation or surrounding inflammatory changes. Spleen: Normal in size without focal abnormality. Adrenals/Urinary Tract: Right adrenal gland normal. Left adrenalectomy and nephrectomy. Large right kidney cyst is again noted arising from upper pole of the right kidney. This measures 5.7 cm. No right-sided hydronephrosis. The within the limitations of unenhanced technique the urinary bladder is grossly unremarkable. Stomach/Bowel: The small bowel loops have a normal course and caliber without findings of obstruction. Right hemicolectomy. No pathologic dilatation of the colon. Vascular/Lymphatic: Aortic atherosclerosis. No abdominopelvic adenopathy. Left iliac  fossa soft tissue mass along the course of the left ureter measures 2.8 x 1.9 cm, image 100/2. New from previous exam. Reproductive: Prostate is unremarkable. Other: None Musculoskeletal: Mild scoliosis and lumbar degenerative disc disease. No aggressive lytic or sclerotic bone lesions. IMPRESSION: 1. Interval development of multiple low-attenuation lesions throughout both lobes of liver compatible  with metastatic disease. 2. New soft tissue mass along the course of the left ureter compatible with recurrent tumor. 3. Small nonspecific lung nodules are stable from previous exam. 4. Coronary artery calcifications. Aortic Atherosclerosis (ICD10-I70.0). Electronically Signed   By: Kerby Moors M.D.   On: 04/12/2019 17:15   CT Chest Wo Contrast  Result Date: 04/12/2019 CLINICAL DATA:  History of diffuse B-cell lymphoma with urothelial carcinoma of the renal pelvis. EXAM: CT CHEST, ABDOMEN AND PELVIS WITHOUT CONTRAST TECHNIQUE: Multidetector CT imaging of the chest, abdomen and pelvis was performed following the standard protocol without IV contrast. COMPARISON:  12/26/2017 FINDINGS: CT CHEST FINDINGS Cardiovascular: Normal heart size. Aortic atherosclerosis. Ascending thoracic aorta is ectatic measuring 4 cm in diameter, image 32/2. Lad and RCA coronary artery calcifications. Mediastinum/Nodes: No enlarged mediastinal, hilar, or axillary lymph nodes. Thyroid gland, trachea, and esophagus demonstrate no significant findings. Lungs/Pleura: No pleural effusion. Superior segment left lower lobe perifissural nodule measures 3 mm, image 89/6. Previously 5 mm. 3 mm anterolateral left upper lobe lung nodule is unchanged, image 58/6. Musculoskeletal: No chest wall mass or suspicious bone lesions identified. CT ABDOMEN PELVIS FINDINGS Hepatobiliary: Interval development of multiple low-attenuation lesions throughout both lobes of liver highly concerning for metastatic disease. -index lesion within anterior dome measures 4.1 x 3.3 cm, image 53/2. -index lesion within posterior dome of liver measures 5.2 x 6.6 cm, image 56/2. -Index lesion within lateral segment measures 3.2 x 2.8 cm. -index inferior P[place]ock lobe lesion measures 4.1 x 3.0 cm, image 77/2. Gallbladder negative. No biliary ductal dilatation. Pancreas: Unremarkable. No pancreatic ductal dilatation or surrounding inflammatory changes. Spleen: Normal in size  without focal abnormality. Adrenals/Urinary Tract: Right adrenal gland normal. Left adrenalectomy and nephrectomy. Large right kidney cyst is again noted arising from upper pole of the right kidney. This measures 5.7 cm. No right-sided hydronephrosis. The within the limitations of unenhanced technique the urinary bladder is grossly unremarkable. Stomach/Bowel: The small bowel loops have a normal course and caliber without findings of obstruction. Right hemicolectomy. No pathologic dilatation of the colon. Vascular/Lymphatic: Aortic atherosclerosis. No abdominopelvic adenopathy. Left iliac fossa soft tissue mass along the course of the left ureter measures 2.8 x 1.9 cm, image 100/2. New from previous exam. Reproductive: Prostate is unremarkable. Other: None Musculoskeletal: Mild scoliosis and lumbar degenerative disc disease. No aggressive lytic or sclerotic bone lesions. IMPRESSION: 1. Interval development of multiple low-attenuation lesions throughout both lobes of liver compatible with metastatic disease. 2. New soft tissue mass along the course of the left ureter compatible with recurrent tumor. 3. Small nonspecific lung nodules are stable from previous exam. 4. Coronary artery calcifications. Aortic Atherosclerosis (ICD10-I70.0). Electronically Signed   By: Kerby Moors M.D.   On: 04/12/2019 17:15   04/21/17 CT Abdomen  ASSESSMENT & PLAN:   86 y.o. male with  1. Diffuse Large B Cell Lymphoma - Stage II -bulky  05/26/17 PET/CT which showed 1. 5 cm right-sided retroperitoneal nodal mass is hypermetabolic and consistent with known lymphoma. Small lymph node just below the aortic bifurcation is also hypermetabolic. -ECHO shows normal EF.  S/p 4 cycles of R-mini CHOP  07/28/17 PET/CT which revealed Significant response  to interval therapy. The large retroperitoneal mass is significantly smaller with minimal residual activity, slightly greater than blood pool ([place] 3). No evidence of disease  progression.  12/26/17 PET/CT revealed No evidence of lymphoma recurrence on FDG PET scan. 2. Continued decrease in size and metabolic activity retroperitoneal periaortic mass. (Deauville 2) 3. LEFT nephrectomy.  S/p 30.6 Gy in 17 fractions completed on 03/19/18 with Dr. Isidore Moos  2. 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  -s/p left nephroureterectomy pm 09/24/2017 -continue urologic f/u with Dr Tresa Moore.  3. S/p post operative Acute UTI with E.coli with sepsis and dehydration 4.s/p  AKI on CKD- single kidney. - likely due to poor po intake- creatinine improved with IVF 5. Protein calorie malnutrition-moderate 6. 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 S/p TURB tumor on 12/12/17 with Dr. Tresa Moore in urology  PLAN: -Discussed pt labwork today, 04/13/19; blood counts are steady, Albumin is low, other blood chemistries are stable -Discussed 04/13/2019 LDH has continue to elevate -Discussed 04/12/2019 CT C/A/P [id number]) [id number]) revealed metastatic lesions on the liver, recurrent tumor along the left ureter, and stable nonspecific lung nodules  -Advised pt that the findings of the CT C/A/P are more concerning for recurrent Urothelial Cancer -Would need a needle biopsy to confirm diagnosis - would try for liver, as its easier to access -Advised pt that our options to treat include repeat chemotherapy or immunotherapy - pt is concerned about being able to handle chemotherapy a second time -Will re-discuss options after the results of the biopsy return  -Offered referral to nutritionist, Ernestene Kiel - pt declined  -Will get a Liver Bx -Rx Marinol   FOLLOW UP: US guided liver lesion biopsy scheduled for 04/27/2019 F/u with Dr Irene Limbo  on 3/5 or 3/8 (scheduling msg sent)   The total time spent in the appt was 30 minutes and more than 50% was on counseling and direct patient cares.  All of the patient's questions were answered with apparent satisfaction. The patient knows to call the clinic with any problems, questions or concerns.   Sullivan Lone MD [place] AAHIVMS [facility] Hematology/Oncology Physician [facility]  (Office):       [phone] (Work cell):  [phone] (Fax):           [phone]  04/13/2019 4:01 PM  I, Yevette Edwards, am acting as a scribe for Dr. Sullivan Lone.   .I have reviewed the above documentation for accuracy and completeness, and I agree with the above. Brunetta Genera MD

## 2019-04-14 ENCOUNTER — Encounter (HOSPITAL_COMMUNITY): Payer: Self-pay

## 2019-04-14 NOTE — Progress Notes (Signed)
Ember L. Paz Winsett" Male, 86 y.o., [birth year] MRN:  [id number] Phone:  [phone] (H) PCP:  Lorene Dy, MD Coverage:  [place] [employer] Next Appt With Radiology ([facility]US 2) 04/27/2019 at 1:00 PM  RE: Biopsy Received: Yesterday Message Contents  Greggory Keen, MD  Lenore Cordia      Ok for US liver mass/met bx   See CT 04/12/19   Sent in saline with lymphoma vs urothelial ca in the history   TS   Previous Messages  ----- Message -----  From: Lenore Cordia  Sent: 04/13/2019  4:14 PM EST  To: Ir Procedure Requests  Subject: Biopsy                      Procedure Requested: CT Biopsy    Reason for Procedure: Patient with 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

## 2019-04-20 ENCOUNTER — Telehealth: Payer: Self-pay | Admitting: Hematology

## 2019-04-20 NOTE — Telephone Encounter (Signed)
Scheduled 3/10 appt per sch msg. Pt is aware of appt date and time.

## 2019-04-26 ENCOUNTER — Other Ambulatory Visit: Payer: Self-pay | Admitting: Radiology

## 2019-04-27 ENCOUNTER — Ambulatory Visit (HOSPITAL_COMMUNITY)
Admission: RE | Admit: 2019-04-27 | Discharge: 2019-04-27 | Disposition: A | Discharge status: Home / Self Care | Payer: Medicare Other | Source: Ambulatory Visit | Attending: Hematology | Admitting: Hematology

## 2019-04-27 ENCOUNTER — Other Ambulatory Visit: Payer: Self-pay

## 2019-04-27 ENCOUNTER — Encounter (HOSPITAL_COMMUNITY): Payer: Self-pay

## 2019-04-27 DIAGNOSIS — Z85038 Personal history of other malignant neoplasm of large intestine: Secondary | ICD-10-CM | POA: Diagnosis not present

## 2019-04-27 DIAGNOSIS — Z79899 Other long term (current) drug therapy: Secondary | ICD-10-CM | POA: Insufficient documentation

## 2019-04-27 DIAGNOSIS — C787 Secondary malignant neoplasm of liver and intrahepatic bile duct: Secondary | ICD-10-CM | POA: Diagnosis not present

## 2019-04-27 DIAGNOSIS — C689 Malignant neoplasm of urinary organ, unspecified: Secondary | ICD-10-CM | POA: Diagnosis not present

## 2019-04-27 DIAGNOSIS — K769 Liver disease, unspecified: Secondary | ICD-10-CM | POA: Diagnosis present

## 2019-04-27 DIAGNOSIS — C8333 Diffuse large B-cell lymphoma, intra-abdominal lymph nodes: Secondary | ICD-10-CM | POA: Diagnosis not present

## 2019-04-27 DIAGNOSIS — E785 Hyperlipidemia, unspecified: Secondary | ICD-10-CM | POA: Diagnosis not present

## 2019-04-27 DIAGNOSIS — N183 Chronic kidney disease, stage 3 unspecified: Secondary | ICD-10-CM | POA: Diagnosis not present

## 2019-04-27 LAB — CBC WITH DIFFERENTIAL/PLATELET
Abs Immature Granulocytes: 0.06 10*3/uL (ref 0.00–0.07)
Basophils Absolute: 0.1 10*3/uL (ref 0.0–0.1)
Basophils Relative: 0 %
Eosinophils Absolute: 0.5 10*3/uL (ref 0.0–0.5)
Eosinophils Relative: 4 %
HCT: 35.7 % — ABNORMAL LOW (ref 39.0–52.0)
Hemoglobin: 11.4 g/dL — ABNORMAL LOW (ref 13.0–17.0)
Immature Granulocytes: 1 %
Lymphocytes Relative: 12 %
Lymphs Abs: 1.5 10*3/uL (ref 0.7–4.0)
MCH: 31.4 pg (ref 26.0–34.0)
MCHC: 31.9 g/dL (ref 30.0–36.0)
MCV: 98.3 fL (ref 80.0–100.0)
Monocytes Absolute: 1.1 10*3/uL — ABNORMAL HIGH (ref 0.1–1.0)
Monocytes Relative: 8 %
Neutro Abs: 10 10*3/uL — ABNORMAL HIGH (ref 1.7–7.7)
Neutrophils Relative %: 75 %
Platelets: 345 10*3/uL (ref 150–400)
RBC: 3.63 MIL/uL — ABNORMAL LOW (ref 4.22–5.81)
RDW: 13.7 % (ref 11.5–15.5)
WBC: 13.2 10*3/uL — ABNORMAL HIGH (ref 4.0–10.5)
nRBC: 0 % (ref 0.0–0.2)

## 2019-04-27 LAB — COMPREHENSIVE METABOLIC PANEL
ALT: 27 U/L (ref 0–44)
AST: 36 U/L (ref 15–41)
Albumin: 3.5 g/dL (ref 3.5–5.0)
Alkaline Phosphatase: 80 U/L (ref 38–126)
Anion gap: 12 (ref 5–15)
BUN: 36 mg/dL — ABNORMAL HIGH (ref 8–23)
CO2: 18 mmol/L — ABNORMAL LOW (ref 22–32)
Calcium: 9.1 mg/dL (ref 8.9–10.3)
Chloride: 108 mmol/L (ref 98–111)
Creatinine, Ser: 1.76 mg/dL — ABNORMAL HIGH (ref 0.61–1.24)
GFR calc Af Amer: 40 mL/min — ABNORMAL LOW (ref >60)
GFR calc non Af Amer: 35 mL/min — ABNORMAL LOW (ref >60)
Glucose, Bld: 104 mg/dL — ABNORMAL HIGH (ref 70–99)
Potassium: 4.6 mmol/L (ref 3.5–5.1)
Sodium: 138 mmol/L (ref 135–145)
Total Bilirubin: 0.6 mg/dL (ref 0.3–1.2)
Total Protein: 7.5 g/dL (ref 6.5–8.1)

## 2019-04-27 LAB — PROTIME-INR
INR: 1.3 — ABNORMAL HIGH (ref 0.8–1.2)
Prothrombin Time: 16.2 seconds — ABNORMAL HIGH (ref 11.4–15.2)

## 2019-04-27 MED ORDER — LIDOCAINE HCL 1 % IJ SOLN
INTRAMUSCULAR | Status: AC
  Filled 2019-04-27: qty 20

## 2019-04-27 MED ORDER — SODIUM CHLORIDE 0.9 % IV SOLN: INTRAVENOUS | Status: DC

## 2019-04-27 MED ORDER — MIDAZOLAM HCL 2 MG/2ML IJ SOLN
INTRAMUSCULAR | Status: AC | PRN
  Administered 2019-04-27: 1 mg via INTRAVENOUS

## 2019-04-27 MED ORDER — FENTANYL CITRATE (PF) 100 MCG/2ML IJ SOLN
INTRAMUSCULAR | Status: AC
  Filled 2019-04-27: qty 2

## 2019-04-27 MED ORDER — LIDOCAINE HCL (PF) 1 % IJ SOLN
INTRAMUSCULAR | Status: AC | PRN
  Administered 2019-04-27: 10 mL

## 2019-04-27 MED ORDER — FENTANYL CITRATE (PF) 100 MCG/2ML IJ SOLN
INTRAMUSCULAR | Status: AC | PRN
  Administered 2019-04-27: 50 ug via INTRAVENOUS

## 2019-04-27 MED ORDER — MIDAZOLAM HCL 2 MG/2ML IJ SOLN
INTRAMUSCULAR | Status: AC
  Filled 2019-04-27: qty 2

## 2019-04-27 NOTE — H&P (Signed)
Chief Complaint: Patient was seen in consultation today for a liver lesion biopsy.  Referring Physician(s): Brunetta Genera  Supervising Physician: Markus Daft  Patient Status: [facility] - Out-pt  History of Present Illness: Jonathan Marshall is a 86 y.o. male with a past medical history significant for depression, DDD, BPH, CKD with solitary kidney, HLD, colon cancer (1993) and diffuse large B cell lymphoma followed by Dr. Irene Limbo who presents today for a liver lesion biopsy. Jonathan Marshall noted blood in his urine in 2017 and underwent a TURP with stent placement in July of 2018 and has been followed regularly by urology. 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 He was also found to have a right retroperitoneal mass which was biopsied as well in March of 2019 and showed large B-cell lymphoma for which he has been followed regularly by oncology. He underwent a CT chest/abodmen/pelvis w/o contrast on 04/12/19 for routine follow up which noted interval development of multiple low-attenuation lesions throughout both lobes of the liver compatible with metastatic disease, a new soft tissue mass along the course of the left ureter compatible with recurrent tumor and small nonspecific lung nodules stable from previous exam. IR has been consulted for a liver lesion biopsy to determine if he has recurrence of urothelial cancer.  Jonathan Marshall states he has been feeling well overall except that he does not have much of an appetite and thinks he has lost some weight. He denies any pain anywhere. He has some difficulty hearing out of his right ear but hears pretty well out of his left ear. He is ready for his liver biopsy so his team can make a plan for the future.   Past Medical History:  Diagnosis Date   BPH (benign prostatic hyperplasia)    CKD (chronic  kidney disease), stage III    Colon cancer ([place])    dx'd 1993   DDD (degenerative disc disease), lumbar    L4-L5   Depression    Diffuse large B cell lymphoma [facility]) oncologist-- dr Irene Limbo   dx 03/ 2019---  bx right retroperitoneal mass, Stage II,  completed radiation 03-19-2018   Early stage glaucoma    both eyes   History of adenomatous polyp of colon    History of cancer chemotherapy    completed 08-27-2017   History of colon cancer followed by GI-- dr Cristina Gong   dx 1993  s/p  hemicolectomy (malignant tumor x2)  and chemo therapy completed 1993--- (10-20-2018 no recurrence per pt)   History of radiation therapy 02/24/18- 03/19/18   Abdomen, 1.8 Gy in 17 fractions for a total dose of 30.6 Gy.    Hyperlipidemia    Recurrent malignant neoplasm of bladder [facility]) urologist-- dr Tresa Moore   first dx 10/ 2019  s/p  TURBT   Right-sided sensorineural hearing loss    Solitary right kidney    acquired--- s/p  left nephroureterectomy 09-24-2017   Urothelial carcinoma of left distal ureter [facility]) oncologist-- dr Irene Limbo   dx 02/ 2019  --   chemo completed 07/ 2019;  s/p  left nephroureterectomy 09-24-2017,      Past Surgical History:  Procedure Laterality Date   APPENDECTOMY     CARPAL TUNNEL RELEASE Left 08/13/2013   Procedure: CARPAL TUNNEL RELEASE LEFT WRIST;  Surgeon: Yvette Rack., MD;  Location: [facility];  Service: Orthopedics;  Laterality: Left;   CATARACT EXTRACTION W/ INTRAOCULAR LENS  IMPLANT, BILATERAL  07/2018   CYSTOSCOPY W/ RETROGRADES Bilateral 04/03/2017   Procedure: CYSTOSCOPY WITH RETROGRADE PYELOGRAM, LEFT URETEROSCOPY, LEFT STENT PLACEMENT;  Surgeon: Franchot Gallo, MD;  Location: [facility];  Service: Urology;  Laterality: Bilateral;   CYSTOSCOPY W/ RETROGRADES Bilateral 12/12/2017   Procedure: CYSTOSCOPY WITH RETROGRADE PYELOGRAM;  Surgeon: Alexis Frock, MD;  Location[facility];  Service: Urology;   Laterality: Bilateral;   CYSTOSCOPY W/ RETROGRADES Right 10/21/2018   Procedure: CYSTOSCOPY WITH RETROGRADE PYELOGRAM;  Surgeon: Alexis Frock, MD;  Location: [facility];  Service: Urology;  Laterality: Right;   CYSTOSCOPY W/ URETERAL STENT PLACEMENT Left 04/23/2017   Procedure: CYSTOSCOPY WITH STENT REPLACEMENT;  Surgeon: Alexis Frock, MD;  Location: [facility];  Service: Urology;  Laterality: Left;   CYSTOSCOPY WITH FULGERATION Left 04/03/2017   Procedure: LEFT URETERAL BIOPSY;  Surgeon: Franchot Gallo, MD;  Location: [facility];  Service: Urology;  Laterality: Left;   CYSTOSCOPY/RETROGRADE/URETEROSCOPY Left 04/23/2017   Procedure: CYSTOSCOPY/RETROGRADE/URETEROSCOPY, LEFT  URETEROSCOPY WITY  BIOPSY;  Surgeon: Alexis Frock, MD;  Location: [facility];  Service: Urology;  Laterality: Left;   HEMICOLECTOMY  1993   malignant tumor x2 , colon cancer and Appendectomy   INGUINAL HERNIA REPAIR Right 2003   IR FLUORO GUIDE PORT INSERTION RIGHT  05/30/2017   IR REMOVAL TUN ACCESS W/ PORT W/O FL MOD SED  07/06/2018   IR US GUIDE VASC ACCESS RIGHT  05/30/2017   MASTOIDECTOMY Right 2015   ROBOT ASSITED LAPAROSCOPIC NEPHROURETERECTOMY Left 09/24/2017   Procedure: XI ROBOT ASSITED LAPAROSCOPIC NEPHROURETERECTOMY;  Surgeon: Alexis Frock, MD;  Location: [facility];  Service: Urology;  Laterality: Left;   ROBOTIC ASSISTED LAPAROSCOPIC LYSIS OF ADHESION  09/24/2017   Procedure: XI ROBOTIC ASSISTED LAPAROSCOPIC EXTENSIVE LYSIS OF ADHESION;  Surgeon: Alexis Frock, MD;  Location: [facility];  Service: Urology;;   SIGMOIDOSCOPY  last one 06/ 2018   TRANSURETHRAL RESECTION OF BLADDER TUMOR N/A 12/12/2017   Procedure: TRANSURETHRAL RESECTION OF BLADDER TUMOR (TURBT);  Surgeon: Alexis Frock, MD;  Location: [facility];  Service: Urology;  Laterality: N/A;   TRANSURETHRAL RESECTION OF BLADDER TUMOR N/A 10/21/2018   Procedure:  TRANSURETHRAL RESECTION OF BLADDER TUMOR (TURBT);  Surgeon: Alexis Frock, MD;  Location: [facility];  Service: Urology;  Laterality: N/A;   TRANSURETHRAL RESECTION OF PROSTATE N/A 09/02/2016   Procedure: TRANSURETHRAL RESECTION OF THE PROSTATE (TURP);  Surgeon: Franchot Gallo, MD;  Location: [facility];  Service: Urology;  Laterality: N/A;    Allergies: Patient has no known allergies.  Medications: Prior to Admission medications   Medication Sig Start Date End Date Taking? Authorizing Provider  dronabinol (MARINOL) 5 MG capsule Take 1 capsule (5 mg total) by mouth 2 (two) times daily before a meal. 04/13/19   Brunetta Genera, MD  fluticasone [facility]) 50 MCG/ACT nasal spray Place 1 spray into both nostrils as needed.  01/16/18   [provider]  latanoprost (XALATAN) 0.005 % ophthalmic solution Place 1 drop into both eyes at bedtime.    [provider]  lovastatin (MEVACOR) 40 MG tablet Take 40 mg by mouth every evening.    [provider]  oxybutynin (DITROPAN-XL) 5 MG 24 hr tablet Take 5 mg by mouth at bedtime.    [provider]  sertraline (ZOLOFT) 25 MG tablet Take 25 mg by mouth  every morning.     [provider]  timolol (TIMOPTIC) 0.5 % ophthalmic solution Place 1 drop into both eyes daily.  12/22/17   [provider]  traMADol (ULTRAM) 50 MG tablet Take 1 tablet (50 mg total) by mouth every 6 (six) hours as needed for moderate pain or severe pain. Post-operatively Patient not taking: Reported on 03/17/2019 10/21/18   Alexis Frock, MD     No family history on file.  Social History   Socioeconomic History   Marital status: Married    Spouse name: Not on file   Number of children: Not on file   Years of education: Not on file   Highest education level: Not on file  Occupational History   Not on file  Tobacco Use   Smoking status: Never Smoker   Smokeless tobacco: Never  Used  Substance and Sexual Activity   Alcohol use: Yes    Alcohol/week: 1.0 standard drinks    Types: 1 Glasses of wine per week    Comment: rare   Drug use: Never   Sexual activity: Not Currently  Other Topics Concern   Not on file  Social History Narrative   Not on file   Social Determinants of Health   Financial Resource Strain:    Difficulty of Paying Living Expenses: Not on file  Food Insecurity:    Worried About [place] in the Last Year: Not on file   Ran Out of Food in the Last Year: Not on file  Transportation Needs:    Lack of Transportation (Medical): Not on file   Lack of Transportation (Non-Medical): Not on file  Physical Activity:    Days of Exercise per Week: Not on file   Minutes of Exercise per Session: Not on file  Stress:    Feeling of Stress : Not on file  Social Connections:    Frequency of Communication with Friends and Family: Not on file   Frequency of Social Gatherings with Friends and Family: Not on file   Attends Religious Services: Not on file   Active Member of Clubs or Organizations: Not on file   Attends Archivist Meetings: Not on file   Marital Status: Not on file     Review of Systems: A 12 point ROS discussed and pertinent positives are indicated in the HPI above.  All other systems are negative.  Review of Systems  Constitutional: Positive for appetite change. Negative for chills and fever.  HENT: Negative for nosebleeds.   Respiratory: Negative for cough and shortness of breath.   Cardiovascular: Negative for chest pain.  Gastrointestinal: Negative for abdominal pain, blood in stool, diarrhea, nausea and vomiting.  Musculoskeletal: Negative for back pain.  Neurological: Negative for dizziness and headaches.    Vital Signs: BP 117/78    Pulse 74    Temp 98 F (36.7 C) (Oral)    Resp 18    SpO2 100%   Physical Exam Vitals reviewed.  Constitutional:      General: He is not in acute  distress. HENT:     Head: Normocephalic.     Mouth/Throat:     Mouth: Mucous membranes are moist.     Pharynx: Oropharynx is clear. No oropharyngeal exudate or posterior oropharyngeal erythema.  Cardiovascular:     Rate and Rhythm: Normal rate and regular rhythm.  Pulmonary:     Effort: Pulmonary effort is normal.     Breath sounds: Normal breath sounds.  Abdominal:  General: There is no distension.     Palpations: Abdomen is soft.     Tenderness: There is no abdominal tenderness.  Skin:    General: Skin is warm and dry.  Neurological:     Mental Status: He is alert and oriented to person, place, and time.  Psychiatric:        Mood and Affect: Mood normal.        Behavior: Behavior normal.        Thought Content: Thought content normal.        Judgment: Judgment normal.      MD Evaluation Airway: WNL Heart: WNL Abdomen: WNL Chest/ Lungs: WNL ASA  Classification: 2 Mallampati/Airway Score: Two   Imaging: CT Abdomen Pelvis Wo Contrast  Result Date: 04/12/2019 CLINICAL DATA:  History of diffuse B-cell lymphoma with urothelial carcinoma of the renal pelvis. EXAM: CT CHEST, ABDOMEN AND PELVIS WITHOUT CONTRAST TECHNIQUE: Multidetector CT imaging of the chest, abdomen and pelvis was performed following the standard protocol without IV contrast. COMPARISON:  12/26/2017 FINDINGS: CT CHEST FINDINGS Cardiovascular: Normal heart size. Aortic atherosclerosis. Ascending thoracic aorta is ectatic measuring 4 cm in diameter, image 32/2. Lad and RCA coronary artery calcifications. Mediastinum/Nodes: No enlarged mediastinal, hilar, or axillary lymph nodes. Thyroid gland, trachea, and esophagus demonstrate no significant findings. Lungs/Pleura: No pleural effusion. Superior segment left lower lobe perifissural nodule measures 3 mm, image 89/6. Previously 5 mm. 3 mm anterolateral left upper lobe lung nodule is unchanged, image 58/6. Musculoskeletal: No chest wall mass or suspicious bone  lesions identified. CT ABDOMEN PELVIS FINDINGS Hepatobiliary: Interval development of multiple low-attenuation lesions throughout both lobes of liver highly concerning for metastatic disease. -index lesion within anterior dome measures 4.1 x 3.3 cm, image 53/2. -index lesion within posterior dome of liver measures 5.2 x 6.6 cm, image 56/2. -Index lesion within lateral segment measures 3.2 x 2.8 cm. -index inferior [place] lobe lesion measures 4.1 x 3.0 cm, image 77/2. Gallbladder negative. No biliary ductal dilatation. Pancreas: Unremarkable. No pancreatic ductal dilatation or surrounding inflammatory changes. Spleen: Normal in size without focal abnormality. Adrenals/Urinary Tract: Right adrenal gland normal. Left adrenalectomy and nephrectomy. Large right kidney cyst is again noted arising from upper pole of the right kidney. This measures 5.7 cm. No right-sided hydronephrosis. The within the limitations of unenhanced technique the urinary bladder is grossly unremarkable. Stomach/Bowel: The small bowel loops have a normal course and caliber without findings of obstruction. Right hemicolectomy. No pathologic dilatation of the colon. Vascular/Lymphatic: Aortic atherosclerosis. No abdominopelvic adenopathy. Left iliac fossa soft tissue mass along the course of the left ureter measures 2.8 x 1.9 cm, image 100/2. New from previous exam. Reproductive: Prostate is unremarkable. Other: None Musculoskeletal: Mild scoliosis and lumbar degenerative disc disease. No aggressive lytic or sclerotic bone lesions. IMPRESSION: 1. Interval development of multiple low-attenuation lesions throughout both lobes of liver compatible with metastatic disease. 2. New soft tissue mass along the course of the left ureter compatible with recurrent tumor. 3. Small nonspecific lung nodules are stable from previous exam. 4. Coronary artery calcifications. Aortic Atherosclerosis (ICD10-I70.0). Electronically Signed   By: Kerby Moors M.D.   On:  04/12/2019 17:15   CT Chest Wo Contrast  Result Date: 04/12/2019 CLINICAL DATA:  History of diffuse B-cell lymphoma with urothelial carcinoma of the renal pelvis. EXAM: CT CHEST, ABDOMEN AND PELVIS WITHOUT CONTRAST TECHNIQUE: Multidetector CT imaging of the chest, abdomen and pelvis was performed following the standard protocol without IV contrast. COMPARISON:  12/26/2017 FINDINGS: CT CHEST  FINDINGS Cardiovascular: Normal heart size. Aortic atherosclerosis. Ascending thoracic aorta is ectatic measuring 4 cm in diameter, image 32/2. Lad and RCA coronary artery calcifications. Mediastinum/Nodes: No enlarged mediastinal, hilar, or axillary lymph nodes. Thyroid gland, trachea, and esophagus demonstrate no significant findings. Lungs/Pleura: No pleural effusion. Superior segment left lower lobe perifissural nodule measures 3 mm, image 89/6. Previously 5 mm. 3 mm anterolateral left upper lobe lung nodule is unchanged, image 58/6. Musculoskeletal: No chest wall mass or suspicious bone lesions identified. CT ABDOMEN PELVIS FINDINGS Hepatobiliary: Interval development of multiple low-attenuation lesions throughout both lobes of liver highly concerning for metastatic disease. -index lesion within anterior dome measures 4.1 x 3.3 cm, image 53/2. -index lesion within posterior dome of liver measures 5.2 x 6.6 cm, image 56/2. -Index lesion within lateral segment measures 3.2 x 2.8 cm. -index inferior [place] lobe lesion measures 4.1 x 3.0 cm, image 77/2. Gallbladder negative. No biliary ductal dilatation. Pancreas: Unremarkable. No pancreatic ductal dilatation or surrounding inflammatory changes. Spleen: Normal in size without focal abnormality. Adrenals/Urinary Tract: Right adrenal gland normal. Left adrenalectomy and nephrectomy. Large right kidney cyst is again noted arising from upper pole of the right kidney. This measures 5.7 cm. No right-sided hydronephrosis. The within the limitations of unenhanced technique the  urinary bladder is grossly unremarkable. Stomach/Bowel: The small bowel loops have a normal course and caliber without findings of obstruction. Right hemicolectomy. No pathologic dilatation of the colon. Vascular/Lymphatic: Aortic atherosclerosis. No abdominopelvic adenopathy. Left iliac fossa soft tissue mass along the course of the left ureter measures 2.8 x 1.9 cm, image 100/2. New from previous exam. Reproductive: Prostate is unremarkable. Other: None Musculoskeletal: Mild scoliosis and lumbar degenerative disc disease. No aggressive lytic or sclerotic bone lesions. IMPRESSION: 1. Interval development of multiple low-attenuation lesions throughout both lobes of liver compatible with metastatic disease. 2. New soft tissue mass along the course of the left ureter compatible with recurrent tumor. 3. Small nonspecific lung nodules are stable from previous exam. 4. Coronary artery calcifications. Aortic Atherosclerosis (ICD10-I70.0). Electronically Signed   By: Kerby Moors M.D.   On: 04/12/2019 17:15    Labs:  CBC: Recent Labs    07/06/18 1135 07/06/18 1135 10/21/18 1027 10/22/18 1432 03/17/19 1311 04/13/19 0920  WBC 7.6  --   --  13.4* 10.0 11.4*  HGB 12.0*   < > 15.3 12.4* 12.5* 11.4*  HCT 38.5*   < > 45.0 38.0* 38.6* 35.4*  PLT 189  --   --  208 229 243   < > = values in this interval not displayed.    COAGS: Recent Labs    07/06/18 1135  INR 1.3*  APTT 34    BMP: Recent Labs    05/18/18 1320 05/18/18 1320 10/21/18 1027 10/22/18 1432 03/17/19 1311 04/13/19 0920  NA 142   < > 141 139 138 138  K 4.4   < > 5.0 4.1 5.0 4.4  CL 112*   < > 110 111 110 109  CO2 20*  --   --  20* 21* 20*  GLUCOSE 111*   < > 89 129* 141* 113*  BUN 23   < > 28* 25* 25* 27*  CALCIUM 8.8*  --   --  9.1 9.0 9.5  CREATININE 2.02*  --  2.00* 2.01* 2.03* 2.08*  GFRNONAA 30*  --   --  30* 29* 28*  GFRAA 34*  --   --  34* 34* 33*   < > = values in this interval  not displayed.    LIVER FUNCTION  TESTS: Recent Labs    05/18/18 1320 10/22/18 1432 03/17/19 1311 04/13/19 0920  BILITOT 0.5 0.4 0.4 0.4  AST 24 10* 17 21  ALT 34 15 15 18   ALKPHOS 64 54 78 78  PROT 6.5 6.4* 7.1 7.1  ALBUMIN 3.5 3.5 3.5 3.2*    TUMOR MARKERS: No results for input(s): AFPTM, CEA, CA199, CHROMGRNA in the last 8760 hours.  Assessment and Plan:  86 y/o M with history of colon cancer (1993) s/p chemoradiation, urothelial adenocarcinoma s/p left nephroureterectomy (2019) and diffuse large b-cell lymphome followed by Dr. Irene Limbo who was found to have several liver lesions on most recent CT chest/abd/pelvis concerning for recurrent urothelial adenocarcinoma - IR has been consulted for a liver lesion biopsy to further guide treatment.  Patient has been NPO since midnight, he does not take any blood thinning medications. Afebrile, WBC 13.2, hgb 11.4, plt 345, INR 1.3, creatinine 1.76.  Risks and benefits of liver lesion biopsy was discussed with the patient and/or patient's family including, but not limited to bleeding, infection, damage to adjacent structures or low yield requiring additional tests.  All of the questions were answered and there is agreement to proceed.  Consent signed and in chart.   Thank you for this interesting consult.  I greatly enjoyed meeting ROHIL MINZEY and look forward to participating in their care.  A copy of this report was sent to the requesting provider on this date.  Electronically Signed: Joaquim Nam, PA-C 04/27/2019, 11:36 AM   I spent a total of 30 Minutes  in face to face in clinical consultation, greater than 50% of which was counseling/coordinating care for liver lesion biopsy.

## 2019-04-27 NOTE — Discharge Instructions (Signed)
Liver Biopsy, Care After These instructions give you information on caring for yourself after your procedure. Your doctor may also give you more specific instructions. Call your doctor if you have any problems or questions after your procedure. What can I expect after the procedure? After the procedure, it is common to have:  Pain and soreness where the biopsy was done.  Bruising around the area where the biopsy was done.  Sleepiness and be tired for a few days. Follow these instructions at home: Medicines  Take over-the-counter and prescription medicines only as told by your doctor.  If you were prescribed an antibiotic medicine, take it as told by your doctor. Do not stop taking the antibiotic even if you start to feel better.  Do not take medicines such as aspirin and ibuprofen. These medicines can thin your blood. Do not take these medicines unless your doctor tells you to take them.  If you are taking prescription pain medicine, take actions to prevent or treat constipation. Your doctor may recommend that you: ? Drink enough fluid to keep your pee (urine) clear or pale yellow. ? Take over-the-counter or prescription medicines. ? Eat foods that are high in fiber, such as fresh fruits and vegetables, whole grains, and beans. ? Limit foods that are high in fat and processed sugars, such as fried and sweet foods. Caring for your cut  Follow instructions from your doctor about how to take care of your cuts from surgery (incisions). Make sure you: ? Wash your hands with soap and water before you change your bandage (dressing). If you cannot use soap and water, use hand sanitizer. ? You may remove your bandaide tomorrow .  Check your cuts every day for signs of infection. Check for: ? Redness, swelling, or more pain. ? Fluid or blood. ? Pus or a bad smell. ? Warmth.  Do not take baths, swim, or use a hot tub until your doctor says it is okay to do so. Activity   Rest at home  for 1-2 days or as told by your doctor. ? Avoid sitting for a long time without moving. Get up to take short walks every 1-2 hours.  Return to your normal activities as told by your doctor. Ask what activities are safe for you.  Do not do these things in the first 24 hours: ? Drive. ? Use machinery. ? Take a bath or shower.  Do not lift more than 10 pounds (4.5 kg) or play contact sports for the first 2 weeks. General instructions   Do not drink alcohol in the first week after the procedure.  Have someone stay with you for at least 24 hours after the procedure.  Get your test results. Ask your doctor or the department that is doing the test: ? When will my results be ready? ? How will I get my results? ? What are my treatment options? ? What other tests do I need? ? What are my next steps?  Keep all follow-up visits as told by your doctor. This is important. Contact a doctor if:  A cut bleeds and leaves more than just a small spot of blood.  A cut is red, puffs up (swells), or hurts more than before.  Fluid or something else comes from a cut.  A cut smells bad.  You have a fever or chills. Get help right away if:  You have swelling, bloating, or pain in your belly (abdomen).  You get dizzy or faint.  You have a  rash.  You feel sick to your stomach (nauseous) or throw up (vomit).  You have trouble breathing, feel short of breath, or feel faint.  Your chest hurts.  You have problems talking or seeing.  You have trouble with your balance or moving your arms or legs. Summary  After the procedure, it is common to have pain, soreness, bruising, and tiredness.  Your doctor will tell you how to take care of yourself at home. Change your bandage, take your medicines, and limit your activities as told by your doctor.  Call your doctor if you have symptoms of infection. Get help right away if your belly swells, your cut bleeds a lot, or you have trouble talking or  breathing. This information is not intended to replace advice given to you by your health care provider. Make sure you discuss any questions you have with your health care provider. Document Revised: 02/21/2017 Document Reviewed: 02/21/2017 Elsevier Patient Education  [place].    Moderate Conscious Sedation, Adult, Care After These instructions provide you with information about caring for yourself after your procedure. Your health care provider may also give you more specific instructions. Your treatment has been planned according to current medical practices, but problems sometimes occur. Call your health care provider if you have any problems or questions after your procedure. What can I expect after the procedure? After your procedure, it is common:  To feel sleepy for several hours.  To feel clumsy and have poor balance for several hours.  To have poor judgment for several hours.  To vomit if you eat too soon. Follow these instructions at home: For at least 24 hours after the procedure:   Do not: ? Participate in activities where you could fall or become injured. ? Drive. ? Use heavy machinery. ? Drink alcohol. ? Take sleeping pills or medicines that cause drowsiness. ? Make important decisions or sign legal documents. ? Take care of children on your own.  Rest. Eating and drinking  Follow the diet recommended by your health care provider.  If you vomit: ? Drink water, juice, or soup when you can drink without vomiting. ? Make sure you have little or no nausea before eating solid foods. General instructions  Have a responsible adult stay with you until you are awake and alert.  Take over-the-counter and prescription medicines only as told by your health care provider.  If you smoke, do not smoke without supervision.  Keep all follow-up visits as told by your health care provider. This is important. Contact a health care provider if:  You keep feeling  nauseous or you keep vomiting.  You feel light-headed.  You develop a rash.  You have a fever. Get help right away if:  You have trouble breathing. This information is not intended to replace advice given to you by your health care provider. Make sure you discuss any questions you have with your health care provider. Document Revised: 01/24/2017 Document Reviewed: 06/03/2015 Elsevier Patient Education  2020 [employer].

## 2019-04-27 NOTE — Procedures (Signed)
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    Gordy Goar R. Anselm Pancoast, MD  Pager: [phone]

## 2019-04-28 LAB — SURGICAL PATHOLOGY

## 2019-04-30 ENCOUNTER — Other Ambulatory Visit: Payer: Self-pay

## 2019-04-30 ENCOUNTER — Inpatient Hospital Stay: Payer: Medicare Other | Attending: Hematology | Admitting: Hematology

## 2019-04-30 ENCOUNTER — Telehealth: Payer: Self-pay | Admitting: Hematology

## 2019-04-30 DIAGNOSIS — Z79899 Other long term (current) drug therapy: Secondary | ICD-10-CM | POA: Insufficient documentation

## 2019-04-30 DIAGNOSIS — C787 Secondary malignant neoplasm of liver and intrahepatic bile duct: Secondary | ICD-10-CM | POA: Insufficient documentation

## 2019-04-30 DIAGNOSIS — C8339 Diffuse large B-cell lymphoma, extranodal and solid organ sites: Secondary | ICD-10-CM | POA: Insufficient documentation

## 2019-04-30 DIAGNOSIS — Z905 Acquired absence of kidney: Secondary | ICD-10-CM | POA: Insufficient documentation

## 2019-04-30 DIAGNOSIS — C7989 Secondary malignant neoplasm of other specified sites: Secondary | ICD-10-CM | POA: Insufficient documentation

## 2019-04-30 DIAGNOSIS — Z5112 Encounter for antineoplastic immunotherapy: Secondary | ICD-10-CM | POA: Insufficient documentation

## 2019-04-30 DIAGNOSIS — C679 Malignant neoplasm of bladder, unspecified: Secondary | ICD-10-CM | POA: Insufficient documentation

## 2019-04-30 DIAGNOSIS — R319 Hematuria, unspecified: Secondary | ICD-10-CM | POA: Insufficient documentation

## 2019-04-30 NOTE — Telephone Encounter (Signed)
Scheduled 3/8 appt per RN Sandi.

## 2019-05-03 ENCOUNTER — Inpatient Hospital Stay (HOSPITAL_BASED_OUTPATIENT_CLINIC_OR_DEPARTMENT_OTHER): Payer: Medicare Other | Admitting: Hematology

## 2019-05-03 DIAGNOSIS — C689 Malignant neoplasm of urinary organ, unspecified: Secondary | ICD-10-CM

## 2019-05-03 DIAGNOSIS — Z7189 Other specified counseling: Secondary | ICD-10-CM

## 2019-05-05 ENCOUNTER — Ambulatory Visit: Payer: Medicare Other | Admitting: Hematology

## 2019-05-07 ENCOUNTER — Telehealth: Payer: Self-pay | Admitting: Hematology

## 2019-05-07 ENCOUNTER — Other Ambulatory Visit (HOSPITAL_COMMUNITY): Payer: Self-pay | Admitting: Hematology

## 2019-05-07 MED ORDER — LORAZEPAM 0.5 MG PO TABS: 0.5000 mg | ORAL_TABLET | Freq: Four times a day (QID) | ORAL | 0 refills | Status: AC | PRN

## 2019-05-07 MED ORDER — ONDANSETRON HCL 8 MG PO TABS: 8.0000 mg | ORAL_TABLET | Freq: Two times a day (BID) | ORAL | 1 refills | Status: DC | PRN

## 2019-05-07 MED ORDER — PROCHLORPERAZINE MALEATE 10 MG PO TABS: 10.0000 mg | ORAL_TABLET | Freq: Four times a day (QID) | ORAL | 1 refills | Status: AC | PRN

## 2019-05-07 NOTE — Progress Notes (Signed)
START ON PATHWAY REGIMEN - Bladder     A cycle is every 21 days:     Atezolizumab   **Always confirm dose/schedule in your pharmacy ordering system**  Patient Characteristics: Advanced/Metastatic Disease, First Line, No Prior Platinum-Based Therapy, Poor Renal Function (CrCl < 50 mL/min), Unknown PD-L1 Expression Therapeutic Status: Advanced/Metastatic Disease Line of Therapy: First Line Prior Platinum-Based Therapy<= No Renal Function: Poor Renal Function (CrCl < 50 mL/min) PD-L1 Expression Status: Unknown PD-L1 Expression Intent of Therapy: Non-Curative / Palliative Intent, Discussed with Patient

## 2019-05-07 NOTE — Telephone Encounter (Signed)
error 

## 2019-05-07 NOTE — Addendum Note (Signed)
Addended by: Sullivan Lone on: 05/07/2019 04:03 PM   Modules accepted: Level of Service

## 2019-05-07 NOTE — Telephone Encounter (Signed)
Scheduled appts per 3/12 sch msg. Called and spoke with patient. Confirmed appts. Patient had questions and concerns about starting treatment. Transferred to MD. Hamilton Capri to [facility] about authorization/ approval for treatment.

## 2019-05-07 NOTE — Progress Notes (Signed)
This encounter was created in error - please disregard.

## 2019-05-07 NOTE — Progress Notes (Addendum)
ALERT: A disease instance has been permanently removed from this patient's pathway record and replaced with a new disease instance. Information on the new disease instance will be transmitted in a separate message.  Disease Being Removed: Lymphoma and CLL  Reason for Removal: Reason not listed 

## 2019-05-07 NOTE — Progress Notes (Addendum)
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 male who has been referred to [place] by urologist Dr Alexis Frock for evaluation and management of newly diagnosed diffuse large B Cell Lymphoma. He is accompanied today by his wife. The pt reports that he is doing well overall.   The pt reports that in 1993 he had colon cancer with two tumors in the ascending and descending colon, which was picked up from a routine physical. He notes that he was treated with chemotherapy with Levamisole and Leukovorin for 6 months. He has not had any issues with the colon cancer since. He continue to see Dr. Cristina Gong for his GI.   He notes that he has had blood in the urine for a year and a half, picked up in a physical. This prompted his TURP surgery in July 2018. He notes that the bleeding persisted after the surgery. He notes that a stent was placed which has significantly reduced his hematuria. In February 2019 his left uretral mass was identified and subsequently biopsied confirming adenocarcinoma. The pt notes that Dr. Tresa Moore has up to this point been considering removing one of his kidneys, which would have a bearing on our treatment.   A 04/21/17 CT Abdomen identified a right retroperitoneal mass which was biopsied on 05/08/17 which revealed Large B-Cell Lymphoma.   He notes no ongoing health problems and is without physical limitations. He denies having had any blood transfusions.   On 04/23/17 the pt had a Uretral biopsy revealing Ureter, biopsy, Left mass- SUPERFICIAL FRAGMENTS OF UROTHELIAL CARCINOMA ASSOCIATED WITH GRANULATION TISSUE AND NECROSIS.   CT Abdomen of 04/21/17 revealed large right para-aortic retroperitoneal nodal  conglomeration in the abdomen, just inferior to the right renal artery. Imaging features compatible with metastatic disease in this patient with a history of prostate and left urothelial cancer. 2. Aortic atherosclerosis.   Soft Tissue Needle/core biopsy completed on 05/08/17 with results revealing Large B-Cell Lymphoma.   Most recent lab results (05/08/17) of CBC  is as follows: all values are WNL except for WBC at 11.3k, Monocytes Abs at 1.1k, CO2 at 19, Glucose at 109, Creatinine at 1.54, ALT at 16.  On review of systems, pt reports infrequent hematuria, mild back pain associated with golfing, and denies leg swelling, flank pain, fevers, chills, night sweats and any other symptoms.   On PMHx the pt reports Colon cancer in 1993, hypercholesterolemia, glaucoma, low risk prostate cancer, mastoiditis, hernia repair, carpel tunnel. He denies heart or lung problems.  On Social Hx the pt denies smoking and ETOH consumption. On Family Hx the pt reports that his mother had breast cancer when she was roughly 60, and also cervical cancer. On his mother's side, his aunts and uncles had several cancers including lung. Maternal grandfather had prostate cancer.   Interval History:  Jonathan Marshall returns today for management and evaluation of his Diffuse Large B-Cell Lymphoma and Urothelial carcinoma. I connected with Kelton Pillar on 05/03/19 at 12:00 PM EST by and verified that I am speaking with the correct person using two identifiers.   I discussed the limitations, risks, security and privacy concerns of performing an evaluation and management service by telemedicine and the availability of  in-person appointments. I also discussed with the patient that there may be a patient responsible charge related to this service. The patient expressed understanding and agreed to proceed.   Other persons participating in the visit and their role in the encounter:                                                               -Yevette Edwards , Medical Scribe    Patient's location: Home  Provider's location: [place] [facility]    [name] was on 04/13/2019. The pt reports that he is doing about the same.  Still has poor p.o. intake.  Still losing weight.  Of note since the patient's last visit, pt has had US Biopsy (Liver) ([id number]) completed on 04/27/2019 with results revealing Numerous isoechoic and slightly hyperechoic lesions throughout the liver. Slightly hyperechoic lesion in the right hepatic lobe was targeted. Needle position confirmed within the lesion. Four core biopsies were obtained. No significant bleeding or hematoma formation following the core biopsies.  Pathology from the liver lesion biopsy was discussed with Dr. Tresa Moore and was noted to be consistent with poorly differentiated metastatic urothelial carcinoma.  Results were discussed in detail with the patient. We discussed his goals of care in details.  We discussed that his metastatic urothelial carcinoma condition is not curable since it is widely metastatic and likely multifocal within the urinary tract.  It is however treatable. With his renal function we discussed that I would not recommend a cisplatin-based regimen. We discussed the pros and cons of considering a carboplatin-based palliative chemotherapy regimen versus first-line immunotherapy with atezolizumab. Patient and his wife wanted to discuss this further prior to making a final decision.  Patient notes that he is not particularly keen to pursue chemotherapy options but will think about considering immunotherapy options.  He will also think about whether he wants to pursue best supportive care instead.  MEDICAL HISTORY:  Past Medical History:  Diagnosis Date  . BPH (benign prostatic hyperplasia)   . CKD (chronic kidney disease), stage III   . Colon cancer ([place])    dx'd 1993  . DDD (degenerative disc disease), lumbar    L4-L5  . Depression   .  Diffuse large B cell lymphoma [facility]) oncologist-- dr Irene Limbo   dx 03/ 2019---  bx right retroperitoneal mass, Stage II,  completed radiation 03-19-2018  . Early stage glaucoma    both eyes  . History of adenomatous polyp of colon   . History of cancer chemotherapy    completed 08-27-2017  . History of colon cancer followed by GI-- dr Cristina Gong   dx 1993  s/p  hemicolectomy (malignant tumor x2)  and chemo therapy completed 1993--- (10-20-2018 no recurrence per pt)  . History of radiation therapy 02/24/18- 03/19/18   Abdomen, 1.8 Gy in 17 fractions for a total dose of 30.6 Gy.   [name] Kitchen Hyperlipidemia   . Recurrent malignant neoplasm of bladder [facility]) urologist-- dr Tresa Moore   first dx 10/ 2019  s/p  TURBT  . Right-sided sensorineural hearing loss   . Solitary right kidney    acquired--- s/p  left nephroureterectomy 09-24-2017  . Urothelial carcinoma of left distal ureter [facility]) oncologist-- dr Irene Limbo   dx 02/ 2019  --  chemo completed 07/ 2019;  s/p  left nephroureterectomy 09-24-2017,      SURGICAL HISTORY: Past Surgical History:  Procedure Laterality Date  . APPENDECTOMY    . CARPAL TUNNEL RELEASE Left 08/13/2013   Procedure: CARPAL TUNNEL RELEASE LEFT WRIST;  Surgeon: Yvette Rack., MD;  Location: [facility];  Service: Orthopedics;  Laterality: Left;  . CATARACT EXTRACTION W/ INTRAOCULAR LENS  IMPLANT, BILATERAL  07/2018  . CYSTOSCOPY W/ RETROGRADES Bilateral 04/03/2017   Procedure: CYSTOSCOPY WITH RETROGRADE PYELOGRAM, LEFT URETEROSCOPY, LEFT STENT PLACEMENT;  Surgeon: Franchot Gallo, MD;  Location: [facility];  Service: Urology;  Laterality: Bilateral;  . CYSTOSCOPY W/ RETROGRADES Bilateral 12/12/2017   Procedure: CYSTOSCOPY WITH RETROGRADE PYELOGRAM;  Surgeon: Alexis Frock, MD;  Location: [facility];  Service: Urology;  Laterality: Bilateral;  . CYSTOSCOPY W/ RETROGRADES Right 10/21/2018   Procedure: CYSTOSCOPY WITH RETROGRADE PYELOGRAM;   Surgeon: Alexis Frock, MD;  Location: [facility];  Service: Urology;  Laterality: Right;  . CYSTOSCOPY W/ URETERAL STENT PLACEMENT Left 04/23/2017   Procedure: CYSTOSCOPY WITH STENT REPLACEMENT;  Surgeon: Alexis Frock, MD;  Location: [facility];  Service: Urology;  Laterality: Left;  . CYSTOSCOPY WITH FULGERATION Left 04/03/2017   Procedure: LEFT URETERAL BIOPSY;  Surgeon: Franchot Gallo, MD;  Location: [facility];  Service: Urology;  Laterality: Left;  . CYSTOSCOPY/RETROGRADE/URETEROSCOPY Left 04/23/2017   Procedure: CYSTOSCOPY/RETROGRADE/URETEROSCOPY, LEFT  URETEROSCOPY WITY  BIOPSY;  Surgeon: Alexis Frock, MD;  Location: [facility];  Service: Urology;  Laterality: Left;  . HEMICOLECTOMY  1993   malignant tumor x2 , colon cancer and Appendectomy  . INGUINAL HERNIA REPAIR Right 2003  . IR FLUORO GUIDE PORT INSERTION RIGHT  05/30/2017  . IR REMOVAL TUN ACCESS W/ PORT W/O FL MOD SED  07/06/2018  . IR US GUIDE VASC ACCESS RIGHT  05/30/2017  . MASTOIDECTOMY Right 2015  . ROBOT ASSITED LAPAROSCOPIC NEPHROURETERECTOMY Left 09/24/2017   Procedure: XI ROBOT ASSITED LAPAROSCOPIC NEPHROURETERECTOMY;  Surgeon: Alexis Frock, MD;  Location: [facility];  Service: Urology;  Laterality: Left;  . ROBOTIC ASSISTED LAPAROSCOPIC LYSIS OF ADHESION  09/24/2017   Procedure: XI ROBOTIC ASSISTED LAPAROSCOPIC EXTENSIVE LYSIS OF ADHESION;  Surgeon: Alexis Frock, MD;  Location: [facility];  Service: Urology;;  . Lonell Face  last one 06/ 2018  . TRANSURETHRAL RESECTION OF BLADDER TUMOR N/A 12/12/2017   Procedure: TRANSURETHRAL RESECTION OF BLADDER TUMOR (TURBT);  Surgeon: Alexis Frock, MD;  Location: [facility];  Service: Urology;  Laterality: N/A;  . TRANSURETHRAL RESECTION OF BLADDER TUMOR N/A 10/21/2018   Procedure: TRANSURETHRAL RESECTION OF BLADDER TUMOR (TURBT);  Surgeon: Alexis Frock, MD;  Location: [facility];   Service: Urology;  Laterality: N/A;  . TRANSURETHRAL RESECTION OF PROSTATE N/A 09/02/2016   Procedure: TRANSURETHRAL RESECTION OF THE PROSTATE (TURP);  Surgeon: Franchot Gallo, MD;  Location[facility];  Service: Urology;  Laterality: N/A;    SOCIAL HISTORY: Social History   Socioeconomic History  . Marital status: Married    Spouse name: Not on file  . Number of children: Not on file  . Years of education: Not on file  . Highest education level: Not on file  Occupational History  . Not on file  Tobacco Use  . Smoking status: Never Smoker  . Smokeless tobacco: Never Used  Substance and Sexual Activity  . Alcohol use: Yes    Alcohol/week: 1.0 standard drinks    Types: 1  Glasses of wine per week    Comment: rare  . Drug use: Never  . Sexual activity: Not Currently  Other Topics Concern  . Not on file  Social History Narrative  . Not on file   Social Determinants of Health   Financial Resource Strain:   . Difficulty of Paying Living Expenses:   Food Insecurity:   . Worried About Charity fundraiser in the Last Year:   . Arboriculturist in the Last Year:   Transportation Needs:   . Film/video editor (Medical):   [name] Kitchen Lack of Transportation (Non-Medical):   Physical Activity:   . Days of Exercise per Week:   . Minutes of Exercise per Session:   Stress:   . Feeling of Stress :   Social Connections:   . Frequency of Communication with Friends and Family:   . Frequency of Social Gatherings with Friends and Family:   . Attends Religious Services:   . Active Member of Clubs or Organizations:   . Attends Archivist Meetings:   [name] Kitchen Marital Status:   Intimate Partner Violence:   . Fear of Current or Ex-Partner:   . Emotionally Abused:   [name] Kitchen Physically Abused:   . Sexually Abused:     FAMILY HISTORY: No family history on file.  ALLERGIES:  has No Known Allergies.  MEDICATIONS:  Current Outpatient Medications  Medication Sig Dispense  Refill  . dronabinol (MARINOL) 5 MG capsule Take 1 capsule (5 mg total) by mouth 2 (two) times daily before a meal. 60 capsule 0  . fluticasone (FLONASE) 50 MCG/ACT nasal spray Place 1 spray into both nostrils as needed.     . latanoprost (XALATAN) 0.005 % ophthalmic solution Place 1 drop into both eyes at bedtime.    [name] Kitchen LORazepam (ATIVAN) 0.5 MG tablet Take 1 tablet (0.5 mg total) by mouth every 6 (six) hours as needed (Nausea or vomiting). 30 tablet 0  . lovastatin (MEVACOR) 40 MG tablet Take 40 mg by mouth every evening.    . ondansetron (ZOFRAN) 8 MG tablet Take 1 tablet (8 mg total) by mouth 2 (two) times daily as needed (Nausea or vomiting). 30 tablet 1  . oxybutynin (DITROPAN-XL) 5 MG 24 hr tablet Take 5 mg by mouth at bedtime.    . prochlorperazine (COMPAZINE) 10 MG tablet Take 1 tablet (10 mg total) by mouth every 6 (six) hours as needed (Nausea or vomiting). 30 tablet 1  . sertraline (ZOLOFT) 25 MG tablet Take 25 mg by mouth every morning.     . timolol (TIMOPTIC) 0.5 % ophthalmic solution Place 1 drop into both eyes daily.     . traMADol (ULTRAM) 50 MG tablet Take 1 tablet (50 mg total) by mouth every 6 (six) hours as needed for moderate pain or severe pain. Post-operatively 10 tablet 0   No current facility-administered medications for this visit.    REVIEW OF SYSTEMS:   A 10+ POINT REVIEW OF SYSTEMS WAS OBTAINED including neurology, dermatology, psychiatry, cardiac, respiratory, lymph, extremities, GI, GU, Musculoskeletal, constitutional, breasts, reproductive, HEENT.  All pertinent positives are noted in the HPI.  All others are negative.   PHYSICAL EXAMINATION: Televisit LABORATORY DATA:  I have reviewed the data as listed  . CBC Latest Ref Rng & Units 04/27/2019 04/13/2019 03/17/2019  WBC 4.0 - 10.5 K/uL 13.2(H) 11.4(H) 10.0  Hemoglobin 13.0 - 17.0 g/dL 11.4(L) 11.4(L) 12.5(L)  Hematocrit 39.0 - 52.0 % 35.7(L) 35.4(L) 38.6(L)  Platelets 150 - 400  K/uL 345 243 229    . CMP  Latest Ref Rng & Units 04/27/2019 04/13/2019 03/17/2019  Glucose 70 - 99 mg/dL 104(H) 113(H) 141(H)  BUN 8 - 23 mg/dL 36(H) 27(H) 25(H)  Creatinine 0.61 - 1.24 mg/dL 1.76(H) 2.08(H) 2.03(H)  Sodium 135 - 145 mmol/L 138 138 138  Potassium 3.5 - 5.1 mmol/L 4.6 4.4 5.0  Chloride 98 - 111 mmol/L 108 109 110  CO2 22 - 32 mmol/L 18(L) 20(L) 21(L)  Calcium 8.9 - 10.3 mg/dL 9.1 9.5 9.0  Total Protein 6.5 - 8.1 g/dL 7.5 7.1 7.1  Total Bilirubin 0.3 - 1.2 mg/dL 0.6 0.4 0.4  Alkaline Phos 38 - 126 U/L 80 78 78  AST 15 - 41 U/L 36 21 17  ALT 0 - 44 U/L 27 18 15     Component     Latest Ref Rng & Units 05/15/2017  Retic Ct Pct     0.8 - 1.8 % 1.6  RBC.     4.20 - 5.82 MIL/uL 4.62  Retic Count, Absolute     34.8 - 93.9 K/uL 73.9  Prothrombin Time     11.4 - 15.2 seconds 14.5  INR      1.13  APTT     24 - 36 seconds 31  HIV Screen 4th Generation wRfx     Non Reactive Non Reactive  HCV Ab     0.0 - 0.9 s/co ratio <0.1  Hep B Core Ab, Tot     Negative Negative  Hepatitis B Surface Ag     Negative Negative  LDH     125 - 245 U/L 132   . Lab Results  Component Value Date   LDH 654 (H) 04/13/2019   12/12/17 Biopsy:    05/08/17 Soft Tissue Needle Core Biopsy:   04/23/17 Ureter Biopsy:  10/21/2018 Surgical Pathology   SURGICAL PATHOLOGY  CASE: [id number]  PATIENT: [place] Found  Surgical Pathology Report      Clinical History: History of urothelial cancer and diffuse large B cell  lymphoma with multiple liver lesions, suspect urothelial metastasis (cm)      FINAL MICROSCOPIC DIAGNOSIS:   A. LIVER, RIGHT HEPATIC LOBE, BIOPSY:  - Poorly differentiated carcinoma with necrosis.  - See comment.   COMMENT:  The differential includes metastatic urothelial carcinoma. Additional  testing can be performed if requested.   Dr. Tresa Moore agrees.   RADIOGRAPHIC STUDIES: I have personally reviewed the radiological images as listed and agreed with the findings in the report. CT  Abdomen Pelvis Wo Contrast  Result Date: 04/12/2019 CLINICAL DATA:  History of diffuse B-cell lymphoma with urothelial carcinoma of the renal pelvis. EXAM: CT CHEST, ABDOMEN AND PELVIS WITHOUT CONTRAST TECHNIQUE: Multidetector CT imaging of the chest, abdomen and pelvis was performed following the standard protocol without IV contrast. COMPARISON:  12/26/2017 FINDINGS: CT CHEST FINDINGS Cardiovascular: Normal heart size. Aortic atherosclerosis. Ascending thoracic aorta is ectatic measuring 4 cm in diameter, image 32/2. Lad and RCA coronary artery calcifications. Mediastinum/Nodes: No enlarged mediastinal, hilar, or axillary lymph nodes. Thyroid gland, trachea, and esophagus demonstrate no significant findings. Lungs/Pleura: No pleural effusion. Superior segment left lower lobe perifissural nodule measures 3 mm, image 89/6. Previously 5 mm. 3 mm anterolateral left upper lobe lung nodule is unchanged, image 58/6. Musculoskeletal: No chest wall mass or suspicious bone lesions identified. CT ABDOMEN PELVIS FINDINGS Hepatobiliary: Interval development of multiple low-attenuation lesions throughout both lobes of liver highly concerning for metastatic disease. -index lesion within anterior dome measures 4.1  x 3.3 cm, image 53/2. -index lesion within posterior dome of liver measures 5.2 x 6.6 cm, image 56/2. -Index lesion within lateral segment measures 3.2 x 2.8 cm. -index inferior [place] lobe lesion measures 4.1 x 3.0 cm, image 77/2. Gallbladder negative. No biliary ductal dilatation. Pancreas: Unremarkable. No pancreatic ductal dilatation or surrounding inflammatory changes. Spleen: Normal in size without focal abnormality. Adrenals/Urinary Tract: Right adrenal gland normal. Left adrenalectomy and nephrectomy. Large right kidney cyst is again noted arising from upper pole of the right kidney. This measures 5.7 cm. No right-sided hydronephrosis. The within the limitations of unenhanced technique the urinary bladder is  grossly unremarkable. Stomach/Bowel: The small bowel loops have a normal course and caliber without findings of obstruction. Right hemicolectomy. No pathologic dilatation of the colon. Vascular/Lymphatic: Aortic atherosclerosis. No abdominopelvic adenopathy. Left iliac fossa soft tissue mass along the course of the left ureter measures 2.8 x 1.9 cm, image 100/2. New from previous exam. Reproductive: Prostate is unremarkable. Other: None Musculoskeletal: Mild scoliosis and lumbar degenerative disc disease. No aggressive lytic or sclerotic bone lesions. IMPRESSION: 1. Interval development of multiple low-attenuation lesions throughout both lobes of liver compatible with metastatic disease. 2. New soft tissue mass along the course of the left ureter compatible with recurrent tumor. 3. Small nonspecific lung nodules are stable from previous exam. 4. Coronary artery calcifications. Aortic Atherosclerosis (ICD10-I70.0). Electronically Signed   By: Kerby Moors M.D.   On: 04/12/2019 17:15   CT Chest Wo Contrast  Result Date: 04/12/2019 CLINICAL DATA:  History of diffuse B-cell lymphoma with urothelial carcinoma of the renal pelvis. EXAM: CT CHEST, ABDOMEN AND PELVIS WITHOUT CONTRAST TECHNIQUE: Multidetector CT imaging of the chest, abdomen and pelvis was performed following the standard protocol without IV contrast. COMPARISON:  12/26/2017 FINDINGS: CT CHEST FINDINGS Cardiovascular: Normal heart size. Aortic atherosclerosis. Ascending thoracic aorta is ectatic measuring 4 cm in diameter, image 32/2. Lad and RCA coronary artery calcifications. Mediastinum/Nodes: No enlarged mediastinal, hilar, or axillary lymph nodes. Thyroid gland, trachea, and esophagus demonstrate no significant findings. Lungs/Pleura: No pleural effusion. Superior segment left lower lobe perifissural nodule measures 3 mm, image 89/6. Previously 5 mm. 3 mm anterolateral left upper lobe lung nodule is unchanged, image 58/6. Musculoskeletal: No chest  wall mass or suspicious bone lesions identified. CT ABDOMEN PELVIS FINDINGS Hepatobiliary: Interval development of multiple low-attenuation lesions throughout both lobes of liver highly concerning for metastatic disease. -index lesion within anterior dome measures 4.1 x 3.3 cm, image 53/2. -index lesion within posterior dome of liver measures 5.2 x 6.6 cm, image 56/2. -Index lesion within lateral segment measures 3.2 x 2.8 cm. -index inferior P[place]ock lobe lesion measures 4.1 x 3.0 cm, image 77/2. Gallbladder negative. No biliary ductal dilatation. Pancreas: Unremarkable. No pancreatic ductal dilatation or surrounding inflammatory changes. Spleen: Normal in size without focal abnormality. Adrenals/Urinary Tract: Right adrenal gland normal. Left adrenalectomy and nephrectomy. Large right kidney cyst is again noted arising from upper pole of the right kidney. This measures 5.7 cm. No right-sided hydronephrosis. The within the limitations of unenhanced technique the urinary bladder is grossly unremarkable. Stomach/Bowel: The small bowel loops have a normal course and caliber without findings of obstruction. Right hemicolectomy. No pathologic dilatation of the colon. Vascular/Lymphatic: Aortic atherosclerosis. No abdominopelvic adenopathy. Left iliac fossa soft tissue mass along the course of the left ureter measures 2.8 x 1.9 cm, image 100/2. New from previous exam. Reproductive: Prostate is unremarkable. Other: None Musculoskeletal: Mild scoliosis and lumbar degenerative disc disease. No aggressive lytic or sclerotic  bone lesions. IMPRESSION: 1. Interval development of multiple low-attenuation lesions throughout both lobes of liver compatible with metastatic disease. 2. New soft tissue mass along the course of the left ureter compatible with recurrent tumor. 3. Small nonspecific lung nodules are stable from previous exam. 4. Coronary artery calcifications. Aortic Atherosclerosis (ICD10-I70.0). Electronically Signed    By: Kerby Moors M.D.   On: 04/12/2019 17:15   US BIOPSY (LIVER)  Result Date: 04/27/2019 INDICATION: 86 year old with history of urothelial cancer and diffuse large B-cell lymphoma. Patient has multiple liver lesions and concern for metastatic urothelial disease. Patient needs a tissue diagnosis. EXAM: ULTRASOUND-GUIDED CORE BIOPSY OF LIVER LESION MEDICATIONS: None. ANESTHESIA/SEDATION: Moderate (conscious) sedation was employed during this procedure. A total of Versed 1.0 mg and Fentanyl 50 mcg was administered intravenously. Moderate Sedation Time: 16 minutes. The patient's level of consciousness and vital signs were monitored continuously by radiology nursing throughout the procedure under my direct supervision. FLUOROSCOPY TIME:  None COMPLICATIONS: None immediate. PROCEDURE: Informed written consent was obtained from the patient after a thorough discussion of the procedural risks, benefits and alternatives. All questions were addressed. A timeout was performed prior to the initiation of the procedure. Liver was evaluated with ultrasound. Multiple hepatic lesions were identified. Right side of the abdomen was prepped with chlorhexidine and sterile field was created. Skin and soft tissues were anesthetized with 1% lidocaine. Using ultrasound guidance, 17 gauge coaxial needle was directed into large right hepatic lesion. Needle position confirmed within the lesion. Total of 4 core biopsies were obtained with an 18 gauge core device. Specimens placed in formalin. 17 gauge coaxial needle was removed without complication. Bandage placed over the puncture site. FINDINGS: Numerous isoechoic and slightly hyperechoic lesions throughout the liver. Slightly hyperechoic lesion in the right hepatic lobe was targeted. Needle position confirmed within the lesion. Four core biopsies were obtained. No significant bleeding or hematoma formation following the core biopsies. IMPRESSION: Ultrasound-guided core biopsy of a  right hepatic lesion. Electronically Signed   By: Markus Daft M.D.   On: 04/27/2019 14:38   04/21/17 CT Abdomen  ASSESSMENT & PLAN:   86 y.o. male with  1. Diffuse Large B Cell Lymphoma - Stage II -bulky -currently in complete remission 05/26/17 PET/CT which showed 1. 5 cm right-sided retroperitoneal nodal mass is hypermetabolic and consistent with known lymphoma. Small lymph node just below the aortic bifurcation is also hypermetabolic. -ECHO shows normal EF.  S/p 4 cycles of R-mini CHOP  07/28/17 PET/CT which revealed Significant response to interval therapy. The large retroperitoneal mass is significantly smaller with minimal residual activity, slightly greater than blood pool ([place] 3). No evidence of disease progression.  12/26/17 PET/CT revealed No evidence of lymphoma recurrence on FDG PET scan. 2. Continued decrease in size and metabolic activity retroperitoneal periaortic mass. ([place] 2) 3. LEFT nephrectomy.  S/p 30.6 Gy in 17 fractions completed on 03/19/18 with Dr. Isidore Moos  2. 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  -s/p left nephroureterectomy pm 09/24/2017 -continue urologic f/u with Dr Tresa Moore.   3. S/p post operative Acute UTI with E.coli with sepsis and dehydration 4.s/p  AKI on CKD- single kidney. - likely due to poor po intake- creatinine improved with IVF 5. Protein calorie malnutrition-moderate 6. 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 S/p TURB  tumor on 12/12/17 with Dr. Tresa Moore in urology  11.  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 PLAN: -Discussed patient's PET CT scan with him in detail which shows metastatic  malignancy. -Discussed his biopsy of the liver lesion.  This shows poorly differentiated metastatic urothelial adenocarcinoma. -We discussed in detail that his current condition is not curable and addressed in details his questions about the diagnosis natural history prognosis and treatment options.  -We discussed pros and cons of all treatment options and his goals of care in detail. -We discussed that with his renal insufficiency and single kidney a cisplatin-based regimen will likely not be tolerated safely.  We discussed that the options would be careful consideration of carboplatin plus gemcitabine palliative chemotherapy with likely dose reductions for his renal function age and debility versus palliative immunotherapy with atezolizumab versus best supportive cares through hospice. -Patient and his wife wanted to discuss this further. -They later called our office and decided to pursue palliative immunotherapy with atezolizumab. -This has been ordered and will be scheduled as soon as possible -Rx Marinol   FOLLOW UP: Please schedule to start Atezolizumab treatment ASAP hopefully by early to midweek next week with labs. Follow-up with Dr. Irene Limbo 1 week after first dose of atezolizumab with labs for toxicity check." Scheduling msg sent  . The total time spent in the appointment was 30 minutes and more than 50% was on counseling and direct patient cares.    All of the patient's questions were answered with apparent satisfaction. The patient knows to call the clinic with any problems, questions or concerns.   Sullivan Lone MD [place] AAHIVMS [facility] Hematology/Oncology Physician [facility]  (Office):       [phone] (Work cell):  [phone] (Fax):           [phone]   I, Yevette Edwards, am acting as a scribe for Dr. Sullivan Lone.   .I have reviewed the above documentation for accuracy and completeness, and I agree with the above. Brunetta Genera MD

## 2019-05-10 ENCOUNTER — Other Ambulatory Visit: Payer: Self-pay | Admitting: *Deleted

## 2019-05-10 ENCOUNTER — Inpatient Hospital Stay: Payer: Medicare Other

## 2019-05-10 ENCOUNTER — Other Ambulatory Visit: Payer: Self-pay

## 2019-05-10 VITALS — BP 116/67 | HR 78 | Temp 97.9°F | Resp 16

## 2019-05-10 DIAGNOSIS — Z905 Acquired absence of kidney: Secondary | ICD-10-CM | POA: Diagnosis not present

## 2019-05-10 DIAGNOSIS — C679 Malignant neoplasm of bladder, unspecified: Secondary | ICD-10-CM | POA: Diagnosis not present

## 2019-05-10 DIAGNOSIS — C8333 Diffuse large B-cell lymphoma, intra-abdominal lymph nodes: Secondary | ICD-10-CM

## 2019-05-10 DIAGNOSIS — C689 Malignant neoplasm of urinary organ, unspecified: Secondary | ICD-10-CM

## 2019-05-10 DIAGNOSIS — Z5112 Encounter for antineoplastic immunotherapy: Secondary | ICD-10-CM | POA: Diagnosis present

## 2019-05-10 DIAGNOSIS — Z7189 Other specified counseling: Secondary | ICD-10-CM

## 2019-05-10 DIAGNOSIS — C8339 Diffuse large B-cell lymphoma, extranodal and solid organ sites: Secondary | ICD-10-CM | POA: Diagnosis present

## 2019-05-10 DIAGNOSIS — R319 Hematuria, unspecified: Secondary | ICD-10-CM | POA: Diagnosis not present

## 2019-05-10 DIAGNOSIS — Z79899 Other long term (current) drug therapy: Secondary | ICD-10-CM | POA: Diagnosis not present

## 2019-05-10 DIAGNOSIS — C7989 Secondary malignant neoplasm of other specified sites: Secondary | ICD-10-CM | POA: Diagnosis not present

## 2019-05-10 DIAGNOSIS — C787 Secondary malignant neoplasm of liver and intrahepatic bile duct: Secondary | ICD-10-CM | POA: Diagnosis not present

## 2019-05-10 LAB — CMP (CANCER CENTER ONLY)
ALT: 23 U/L (ref 0–44)
AST: 41 U/L (ref 15–41)
Albumin: 2.8 g/dL — ABNORMAL LOW (ref 3.5–5.0)
Alkaline Phosphatase: 95 U/L (ref 38–126)
Anion gap: 13 (ref 5–15)
BUN: 35 mg/dL — ABNORMAL HIGH (ref 8–23)
CO2: 19 mmol/L — ABNORMAL LOW (ref 22–32)
Calcium: 9.5 mg/dL (ref 8.9–10.3)
Chloride: 108 mmol/L (ref 98–111)
Creatinine: 1.95 mg/dL — ABNORMAL HIGH (ref 0.61–1.24)
GFR, Est AFR Am: 36 mL/min — ABNORMAL LOW (ref >60)
GFR, Estimated: 31 mL/min — ABNORMAL LOW (ref >60)
Glucose, Bld: 141 mg/dL — ABNORMAL HIGH (ref 70–99)
Potassium: 4.7 mmol/L (ref 3.5–5.1)
Sodium: 140 mmol/L (ref 135–145)
Total Bilirubin: 0.3 mg/dL (ref 0.3–1.2)
Total Protein: 7 g/dL (ref 6.5–8.1)

## 2019-05-10 LAB — CBC WITH DIFFERENTIAL (CANCER CENTER ONLY)
Abs Immature Granulocytes: 0.07 10*3/uL (ref 0.00–0.07)
Basophils Absolute: 0 10*3/uL (ref 0.0–0.1)
Basophils Relative: 0 %
Eosinophils Absolute: 0.4 10*3/uL (ref 0.0–0.5)
Eosinophils Relative: 3 %
HCT: 31.5 % — ABNORMAL LOW (ref 39.0–52.0)
Hemoglobin: 10.1 g/dL — ABNORMAL LOW (ref 13.0–17.0)
Immature Granulocytes: 1 %
Lymphocytes Relative: 7 %
Lymphs Abs: 0.9 10*3/uL (ref 0.7–4.0)
MCH: 31 pg (ref 26.0–34.0)
MCHC: 32.1 g/dL (ref 30.0–36.0)
MCV: 96.6 fL (ref 80.0–100.0)
Monocytes Absolute: 0.8 10*3/uL (ref 0.1–1.0)
Monocytes Relative: 6 %
Neutro Abs: 10.4 10*3/uL — ABNORMAL HIGH (ref 1.7–7.7)
Neutrophils Relative %: 83 %
Platelet Count: 303 10*3/uL (ref 150–400)
RBC: 3.26 MIL/uL — ABNORMAL LOW (ref 4.22–5.81)
RDW: 13.8 % (ref 11.5–15.5)
WBC Count: 12.6 10*3/uL — ABNORMAL HIGH (ref 4.0–10.5)
nRBC: 0 % (ref 0.0–0.2)

## 2019-05-10 MED ORDER — SODIUM CHLORIDE 0.9 % IV SOLN
Freq: Once | INTRAVENOUS | Status: AC
  Filled 2019-05-10: qty 250

## 2019-05-10 MED ORDER — SODIUM CHLORIDE 0.9% FLUSH
10.0000 mL | INTRAVENOUS | Status: DC | PRN
  Filled 2019-05-10: qty 10

## 2019-05-10 MED ORDER — HEPARIN SOD (PORK) LOCK FLUSH 100 UNIT/ML IV SOLN
500.0000 [IU] | Freq: Once | INTRAVENOUS | Status: DC | PRN
  Filled 2019-05-10: qty 5

## 2019-05-10 MED ORDER — SODIUM CHLORIDE 0.9 % IV SOLN
1200.0000 mg | Freq: Once | INTRAVENOUS | Status: AC
  Administered 2019-05-10: 1200 mg via INTRAVENOUS
  Filled 2019-05-10: qty 20

## 2019-05-10 NOTE — Progress Notes (Signed)
Verbal order per Dr. Irene Limbo: Jonathan Marshall to receive Atezolizumab today with Cr of 1.95

## 2019-05-10 NOTE — Patient Instructions (Signed)
[facility] °Discharge Instructions for Patients Receiving Chemotherapy ° °Today you received the following chemotherapy agents Tecentriq ° °To help prevent nausea and vomiting after your treatment, we encourage you to take your nausea medication as directed. °  °If you develop nausea and vomiting that is not controlled by your nausea medication, call the clinic.  ° °BELOW ARE SYMPTOMS THAT SHOULD BE REPORTED IMMEDIATELY: °· *FEVER GREATER THAN 100.5 F °· *CHILLS WITH OR WITHOUT FEVER °· NAUSEA AND VOMITING THAT IS NOT CONTROLLED WITH YOUR NAUSEA MEDICATION °· *UNUSUAL SHORTNESS OF BREATH °· *UNUSUAL BRUISING OR BLEEDING °· TENDERNESS IN MOUTH AND THROAT WITH OR WITHOUT PRESENCE OF ULCERS °· *URINARY PROBLEMS °· *BOWEL PROBLEMS °· UNUSUAL RASH °Items with * indicate a potential emergency and should be followed up as soon as possible. ° °Feel free to call the clinic should you have any questions or concerns. The clinic phone number is [phone]. ° °Please show the CHEMO ALERT CARD at check-in to the Emergency Department and triage nurse. ° °Atezolizumab injection °What is this medicine? °ATEZOLIZUMAB (a te zoe LIZ ue mab) is a monoclonal antibody. It is used to treat bladder cancer (urothelial cancer), liver cancer, lung cancer, breast cancer, and melanoma. °This medicine may be used for other purposes; ask your health care provider or pharmacist if you have questions. °COMMON BRAND NAME(S): Tecentriq °What should I tell my health care provider before I take this medicine? °They need to know if you have any of these conditions: °· diabetes °· immune system problems °· infection °· inflammatory bowel disease °· liver disease °· lung or breathing disease °· lupus °· nervous system problems like myasthenia gravis or Guillain-Barre syndrome °· organ transplant °· an unusual or allergic reaction to atezolizumab, other medicines, foods, dyes, or preservatives °· pregnant or trying to get  pregnant °· breast-feeding °How should I use this medicine? °This medicine is for infusion into a vein. It is given by a health care professional in a hospital or clinic setting. °A special MedGuide will be given to you before each treatment. Be sure to read this information carefully each time. °Talk to your pediatrician regarding the use of this medicine in children. Special care may be needed. °Overdosage: If you think you have taken too much of this medicine contact a poison control center or emergency room at once. °NOTE: This medicine is only for you. Do not share this medicine with others. °What if I miss a dose? °It is important not to miss your dose. Call your doctor or health care professional if you are unable to keep an appointment. °What may interact with this medicine? °Interactions have not been studied. °This list may not describe all possible interactions. Give your health care provider a list of all the medicines, herbs, non-prescription drugs, or dietary supplements you use. Also tell them if you smoke, drink alcohol, or use illegal drugs. Some items may interact with your medicine. °What should I watch for while using this medicine? °Your condition will be monitored carefully while you are receiving this medicine. °You may need blood work done while you are taking this medicine. °Do not become pregnant while taking this medicine or for at least 5 months after stopping it. Women should inform their doctor if they wish to become pregnant or think they might be pregnant. There is a potential for serious side effects to an unborn child. Talk to your health care professional or pharmacist for more information. Do not breast-feed an infant while   taking this medicine or for at least 5 months after the last dose. °What side effects may I notice from receiving this medicine? °Side effects that you should report to your doctor or health care professional as soon as possible: °· allergic reactions like skin  rash, itching or hives, swelling of the face, lips, or tongue °· black, tarry stools °· bloody or watery diarrhea °· breathing problems °· changes in vision °· chest pain or chest tightness °· chills °· facial flushing °· fever °· headache °· signs and symptoms of high blood sugar such as dizziness; dry mouth; dry skin; fruity breath; nausea; stomach pain; increased hunger or thirst; increased urination °· signs and symptoms of liver injury like dark yellow or brown urine; general ill feeling or flu-like symptoms; light-colored stools; loss of appetite; nausea; right upper belly pain; unusually weak or tired; yellowing of the eyes or skin °· stomach pain °· trouble passing urine or change in the amount of urine °Side effects that usually do not require medical attention (report to your doctor or health care professional if they continue or are bothersome): °· bone pain °· cough °· diarrhea °· joint pain °· muscle pain °· muscle weakness °· swelling of arms or legs °· tiredness °· weight loss °This list may not describe all possible side effects. Call your doctor for medical advice about side effects. You may report side effects to FDA at 1-800-FDA-1088. °Where should I keep my medicine? °This drug is given in a hospital or clinic and will not be stored at home. °NOTE: This sheet is a summary. It may not cover all possible information. If you have questions about this medicine, talk to your doctor, pharmacist, or health care provider. °© 2020 [employer] (2018-10-02 13:11:14) ° °

## 2019-05-11 ENCOUNTER — Telehealth: Payer: Self-pay | Admitting: *Deleted

## 2019-05-11 NOTE — Telephone Encounter (Signed)
-----   Message from Rennis Harding, RN sent at 05/10/2019  1:29 PM EDT ----- Regarding: Dr. Irene Limbo 1st time 1st time Tecentriq follow up - pt tolerated well

## 2019-05-11 NOTE — Telephone Encounter (Signed)
Called pt to see how he did with his treatment yest.  He reports no problems. He states that he knows how to reach [place] & will call with any concerns.

## 2019-05-14 ENCOUNTER — Telehealth: Payer: Self-pay | Admitting: *Deleted

## 2019-05-14 NOTE — Telephone Encounter (Signed)
Patient wife called. She is concerned that following Monday's infusion (tecentriq) patient is fatigued (drops off to sleep when sits for very long) and has a poor appetite. She has encouraged him with milkhakes and states he is drinking enough fluid. She states she has encouraged him to get up and walk about and not stay seated/napping all the time. Should she worry?  Dr. Irene Limbo informed. Dr. Irene Limbo response - not unexpected following infusion. As long as patient is consuming food and fluids and stays awake enough to take short walks in house, this should continue to get better. Will evaluate next Tuesday at appointment. Contacted wife with this information - she verbalized understanding.

## 2019-05-17 ENCOUNTER — Other Ambulatory Visit: Payer: Self-pay

## 2019-05-17 DIAGNOSIS — C8333 Diffuse large B-cell lymphoma, intra-abdominal lymph nodes: Secondary | ICD-10-CM

## 2019-05-17 NOTE — Progress Notes (Signed)
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 male who has been referred to [place] by urologist Dr Alexis Frock for evaluation and management of newly diagnosed diffuse large B Cell Lymphoma. He is accompanied today by his wife. The pt reports that he is doing well overall.   The pt reports that in 1993 he had colon cancer with two tumors in the ascending and descending colon, which was picked up from a routine physical. He notes that he was treated with chemotherapy with Levamisole and Leukovorin for 6 months. He has not had any issues with the colon cancer since. He continue to see Dr. Cristina Gong for his GI.   He notes that he has had blood in the urine for a year and a half, picked up in a physical. This prompted his TURP surgery in July 2018. He notes that the bleeding persisted after the surgery. He notes that a stent was placed which has significantly reduced his hematuria. In February 2019 his left uretral mass was identified and subsequently biopsied confirming adenocarcinoma. The pt notes that Dr. Tresa Moore has up to this point been considering removing one of his kidneys, which would have a bearing on our treatment.   A 04/21/17 CT Abdomen identified a right retroperitoneal mass which was biopsied on 05/08/17 which revealed Large B-Cell Lymphoma.   He notes no ongoing health problems and is without physical limitations. He denies having had any blood transfusions.   On 04/23/17 the pt had a Uretral biopsy revealing Ureter, biopsy, Left mass- SUPERFICIAL FRAGMENTS OF UROTHELIAL CARCINOMA ASSOCIATED WITH GRANULATION TISSUE AND NECROSIS.   CT Abdomen of 04/21/17 revealed large right para-aortic retroperitoneal nodal  conglomeration in the abdomen, just inferior to the right renal artery. Imaging features compatible with metastatic disease in this patient with a history of prostate and left urothelial cancer. 2. Aortic atherosclerosis.   Soft Tissue Needle/core biopsy completed on 05/08/17 with results revealing Large B-Cell Lymphoma.   Most recent lab results (05/08/17) of CBC  is as follows: all values are WNL except for WBC at 11.3k, Monocytes Abs at 1.1k, CO2 at 19, Glucose at 109, Creatinine at 1.54, ALT at 16.  On review of systems, pt reports infrequent hematuria, mild back pain associated with golfing, and denies leg swelling, flank pain, fevers, chills, night sweats and any other symptoms.   On PMHx the pt reports Colon cancer in 1993, hypercholesterolemia, glaucoma, low risk prostate cancer, mastoiditis, hernia repair, carpel tunnel. He denies heart or lung problems.  On Social Hx the pt denies smoking and ETOH consumption. On Family Hx the pt reports that his mother had breast cancer when she was roughly 77, and also cervical cancer. On his mother's side, his aunts and uncles had several cancers including lung. Maternal grandfather had prostate cancer.   Interval History:  Jonathan Marshall returns today for management and evaluation of his Diffuse Large B-Cell Lymphoma and Urothelial carcinoma. We are joined today by his wife, Mrs. Clouser. [name] was on 05/03/19. The pt reports that he is doing well overall. He has not been walking as much as he was before and sleeping a lot.   The pt reports he has been very  fatigued and cold all the time. The fatigue has been worse than before. He is eating much less than before and does not have an appetite. Pt has stopped taking Marinol. He has been having thick mucus while brushing teeth.   Lab results today (05/18/19) of CBC w/diff and CMP is as follows: all values are WNL except for WBC at 14.9K, RBC at 3.50, Hemoglobin at 10.7, HCT at 33.3,  Neutro Abs at 11.7K, Monocytes Abs at 1.3K, Eosinophils Abs at 0.6K, Abs Immature Granulocytes at 0.11K, CO2 at 17, Glucose at 115, BUN at 44, Creatinine at 2.16, Albumin at 3.1, AST at 47, GFR, Est Non Af Am at 27, GFR, Est AFR Am at 31.  On review of systems, pt reports diahrrea, chills, weight loss, nausea, SOB and denies abdominal pain, pedal edema, rashes, fever and any other symptoms.   MEDICAL HISTORY:  Past Medical History:  Diagnosis Date  . BPH (benign prostatic hyperplasia)   . CKD (chronic kidney disease), stage III   . Colon cancer ([place])    dx'd 1993  . DDD (degenerative disc disease), lumbar    L4-L5  . Depression   . Diffuse large B cell lymphoma [facility]) oncologist-- dr Irene Limbo   dx 03/ 2019---  bx right retroperitoneal mass, Stage II,  completed radiation 03-19-2018  . Early stage glaucoma    both eyes  . History of adenomatous polyp of colon   . History of cancer chemotherapy    completed 08-27-2017  . History of colon cancer followed by GI-- dr Cristina Gong   dx 1993  s/p  hemicolectomy (malignant tumor x2)  and chemo therapy completed 1993--- (10-20-2018 no recurrence per pt)  . History of radiation therapy 02/24/18- 03/19/18   Abdomen, 1.8 Gy in 17 fractions for a total dose of 30.6 Gy.   [name] Kitchen Hyperlipidemia   . Recurrent malignant neoplasm of bladder [facility]) urologist-- dr Tresa Moore   first dx 10/ 2019  s/p  TURBT  . Right-sided sensorineural hearing loss   . Solitary right kidney    acquired--- s/p  left nephroureterectomy 09-24-2017  . Urothelial carcinoma of left distal ureter [facility]) oncologist-- dr Irene Limbo   dx 02/ 2019  --   chemo completed 07/ 2019;  s/p  left nephroureterectomy 09-24-2017,      SURGICAL HISTORY: Past Surgical History:  Procedure Laterality Date  . APPENDECTOMY    . CARPAL TUNNEL RELEASE Left 08/13/2013   Procedure: CARPAL TUNNEL RELEASE LEFT WRIST;  Surgeon: Yvette Rack., MD;  Location: [facility];  Service: Orthopedics;  Laterality: Left;   . CATARACT EXTRACTION W/ INTRAOCULAR LENS  IMPLANT, BILATERAL  07/2018  . CYSTOSCOPY W/ RETROGRADES Bilateral 04/03/2017   Procedure: CYSTOSCOPY WITH RETROGRADE PYELOGRAM, LEFT URETEROSCOPY, LEFT STENT PLACEMENT;  Surgeon: Franchot Gallo, MD;  Location[facility];  Service: Urology;  Laterality: Bilateral;  . CYSTOSCOPY W/ RETROGRADES Bilateral 12/12/2017   Procedure: CYSTOSCOPY WITH RETROGRADE PYELOGRAM;  Surgeon: Alexis Frock, MD;  Location: [facility];  Service: Urology;  Laterality: Bilateral;  . CYSTOSCOPY W/ RETROGRADES Right 10/21/2018   Procedure: CYSTOSCOPY WITH RETROGRADE PYELOGRAM;  Surgeon: Alexis Frock, MD;  Location: [facility];  Service: Urology;  Laterality: Right;  . CYSTOSCOPY W/ URETERAL STENT PLACEMENT Left 04/23/2017   Procedure: CYSTOSCOPY WITH STENT REPLACEMENT;  Surgeon: Alexis Frock, MD;  Location: [facility];  Service: Urology;  Laterality: Left;  . CYSTOSCOPY WITH FULGERATION Left 04/03/2017   Procedure: LEFT URETERAL BIOPSY;  Surgeon: Franchot Gallo, MD;  Location: [facility];  Service: Urology;  Laterality: Left;  . CYSTOSCOPY/RETROGRADE/URETEROSCOPY Left 04/23/2017   Procedure: CYSTOSCOPY/RETROGRADE/URETEROSCOPY, LEFT  URETEROSCOPY WITY  BIOPSY;  Surgeon: Alexis Frock, MD;  Location: [facility];  Service: Urology;  Laterality: Left;  . HEMICOLECTOMY  1993   malignant tumor x2 , colon cancer and Appendectomy  . INGUINAL HERNIA REPAIR Right 2003  . IR FLUORO GUIDE PORT INSERTION RIGHT  05/30/2017  . IR REMOVAL TUN ACCESS W/ PORT W/O FL MOD SED  07/06/2018  . IR US GUIDE VASC ACCESS RIGHT  05/30/2017  . MASTOIDECTOMY Right 2015  . ROBOT ASSITED LAPAROSCOPIC NEPHROURETERECTOMY Left 09/24/2017   Procedure: XI ROBOT ASSITED LAPAROSCOPIC NEPHROURETERECTOMY;  Surgeon: Alexis Frock, MD;  Location: [facility];  Service: Urology;  Laterality: Left;  . ROBOTIC ASSISTED  LAPAROSCOPIC LYSIS OF ADHESION  09/24/2017   Procedure: XI ROBOTIC ASSISTED LAPAROSCOPIC EXTENSIVE LYSIS OF ADHESION;  Surgeon: Alexis Frock, MD;  Location: [facility];  Service: Urology;;  . Lonell Face  last one 06/ 2018  . TRANSURETHRAL RESECTION OF BLADDER TUMOR N/A 12/12/2017   Procedure: TRANSURETHRAL RESECTION OF BLADDER TUMOR (TURBT);  Surgeon: Alexis Frock, MD;  Location[facility];  Service: Urology;  Laterality: N/A;  . TRANSURETHRAL RESECTION OF BLADDER TUMOR N/A 10/21/2018   Procedure: TRANSURETHRAL RESECTION OF BLADDER TUMOR (TURBT);  Surgeon: Alexis Frock, MD;  Location: [facility];  Service: Urology;  Laterality: N/A;  . TRANSURETHRAL RESECTION OF PROSTATE N/A 09/02/2016   Procedure: TRANSURETHRAL RESECTION OF THE PROSTATE (TURP);  Surgeon: Franchot Gallo, MD;  Location: [facility];  Service: Urology;  Laterality: N/A;    SOCIAL HISTORY: Social History   Socioeconomic History  . Marital status: Married    Spouse name: Not on file  . Number of children: Not on file  . Years of education: Not on file  . Highest education level: Not on file  Occupational History  . Not on file  Tobacco Use  . Smoking status: Never Smoker  . Smokeless tobacco: Never Used  Substance and Sexual Activity  . Alcohol use: Yes    Alcohol/week: 1.0 standard drinks    Types: 1 Glasses of wine per week    Comment: rare  . Drug use: Never  . Sexual activity: Not Currently  Other Topics Concern  . Not on file  Social History Narrative  . Not on file   Social Determinants of Health   Financial Resource Strain:   . Difficulty of Paying Living Expenses:   Food Insecurity:   . Worried About Charity fundraiser in the Last Year:   . Arboriculturist in the Last Year:   Transportation Needs:   . Film/video editor (Medical):   [name] Kitchen Lack of Transportation (Non-Medical):   Physical Activity:   . Days of Exercise per Week:   . Minutes of  Exercise per Session:   Stress:   . Feeling of Stress :   Social Connections:   . Frequency of Communication with Friends and Family:   . Frequency of Social Gatherings with Friends and Family:   . Attends Religious Services:   . Active Member of Clubs or Organizations:   . Attends Archivist Meetings:   [name] Kitchen Marital Status:   Intimate Partner Violence:   . Fear of Current or Ex-Partner:   . Emotionally Abused:   [name] Kitchen Physically Abused:   . Sexually Abused:     FAMILY  HISTORY: No family history on file.  ALLERGIES:  has No Known Allergies.  MEDICATIONS:  Current Outpatient Medications  Medication Sig Dispense Refill  . dronabinol (MARINOL) 5 MG capsule Take 1 capsule (5 mg total) by mouth 2 (two) times daily before a meal. 60 capsule 0  . fluticasone (FLONASE) 50 MCG/ACT nasal spray Place 1 spray into both nostrils as needed.     . latanoprost (XALATAN) 0.005 % ophthalmic solution Place 1 drop into both eyes at bedtime.    [name] Kitchen LORazepam (ATIVAN) 0.5 MG tablet Take 1 tablet (0.5 mg total) by mouth every 6 (six) hours as needed (Nausea or vomiting). 30 tablet 0  . lovastatin (MEVACOR) 40 MG tablet Take 40 mg by mouth every evening.    . ondansetron (ZOFRAN) 8 MG tablet Take 1 tablet (8 mg total) by mouth 2 (two) times daily as needed (Nausea or vomiting). 30 tablet 1  . oxybutynin (DITROPAN-XL) 5 MG 24 hr tablet Take 5 mg by mouth at bedtime.    . prochlorperazine (COMPAZINE) 10 MG tablet Take 1 tablet (10 mg total) by mouth every 6 (six) hours as needed (Nausea or vomiting). 30 tablet 1  . sertraline (ZOLOFT) 25 MG tablet Take 25 mg by mouth every morning.     . timolol (TIMOPTIC) 0.5 % ophthalmic solution Place 1 drop into both eyes daily.     . traMADol (ULTRAM) 50 MG tablet Take 1 tablet (50 mg total) by mouth every 6 (six) hours as needed for moderate pain or severe pain. Post-operatively 10 tablet 0   No current facility-administered medications for this visit.    REVIEW  OF SYSTEMS:   A 10+ POINT REVIEW OF SYSTEMS WAS OBTAINED including neurology, dermatology, psychiatry, cardiac, respiratory, lymph, extremities, GI, GU, Musculoskeletal, constitutional, breasts, reproductive, HEENT.  All pertinent positives are noted in the HPI.  All others are negative.   PHYSICAL EXAMINATION: ECOG PERFORMANCE STATUS: 1 - Symptomatic but completely ambulatory  Vitals:   05/18/19 0956  BP: 96/63  Pulse: 81  Resp: 18  Temp: 98.5 F (36.9 C)  SpO2: 100%   Filed Weights   05/18/19 0956  Weight: 150 lb 3.2 oz (68.1 kg)   .Body mass index is 22.18 kg/m.    GENERAL:alert, in no acute distress and comfortable SKIN: no acute rashes, no significant lesions EYES: conjunctiva are pink and non-injected, sclera anicteric OROPHARYNX: MMM, no exudates, no oropharyngeal erythema or ulceration NECK: supple, no JVD LYMPH:  no palpable lymphadenopathy in the cervical, axillary or inguinal regions LUNGS: clear to auscultation b/l with normal respiratory effort HEART: regular rate & rhythm ABDOMEN:  normoactive bowel sounds , non tender, not distended. Extremity: no pedal edema PSYCH: alert & oriented x 3 with fluent speech NEURO: no focal motor/sensory deficits  LABORATORY DATA:  I have reviewed the data as listed  . CBC Latest Ref Rng & Units 05/18/2019 05/10/2019 04/27/2019  WBC 4.0 - 10.5 K/uL 14.9(H) 12.6(H) 13.2(H)  Hemoglobin 13.0 - 17.0 g/dL 10.7(L) 10.1(L) 11.4(L)  Hematocrit 39.0 - 52.0 % 33.3(L) 31.5(L) 35.7(L)  Platelets 150 - 400 K/uL 388 303 345    . CMP Latest Ref Rng & Units 05/18/2019 05/10/2019 04/27/2019  Glucose 70 - 99 mg/dL 115(H) 141(H) 104(H)  BUN 8 - 23 mg/dL 44(H) 35(H) 36(H)  Creatinine 0.61 - 1.24 mg/dL 2.16(H) 1.95(H) 1.76(H)  Sodium 135 - 145 mmol/L 139 140 138  Potassium 3.5 - 5.1 mmol/L 5.1 4.7 4.6  Chloride 98 - 111 mmol/L 108 108 108  CO2 22 - 32 mmol/L 17(L) 19(L) 18(L)  Calcium 8.9 - 10.3 mg/dL 10.0 9.5 9.1  Total Protein 6.5 - 8.1 g/dL  7.9 7.0 7.5  Total Bilirubin 0.3 - 1.2 mg/dL 0.6 0.3 0.6  Alkaline Phos 38 - 126 U/L 120 95 80  AST 15 - 41 U/L 47(H) 41 36  ALT 0 - 44 U/L 36 23 27    Component     Latest Ref Rng & Units 05/15/2017  Retic Ct Pct     0.8 - 1.8 % 1.6  RBC.     4.20 - 5.82 MIL/uL 4.62  Retic Count, Absolute     34.8 - 93.9 K/uL 73.9  Prothrombin Time     11.4 - 15.2 seconds 14.5  INR      1.13  APTT     24 - 36 seconds 31  HIV Screen 4th Generation wRfx     Non Reactive Non Reactive  HCV Ab     0.0 - 0.9 s/co ratio <0.1  Hep B Core Ab, Tot     Negative Negative  Hepatitis B Surface Ag     Negative Negative  LDH     125 - 245 U/L 132   . Lab Results  Component Value Date   LDH 654 (H) 04/13/2019   12/12/17 Biopsy:    05/08/17 Soft Tissue Needle Core Biopsy:   04/23/17 Ureter Biopsy:  10/21/2018 Surgical Pathology   RADIOGRAPHIC STUDIES: I have personally reviewed the radiological images as listed and agreed with the findings in the report. US BIOPSY (LIVER)  Result Date: 04/27/2019 INDICATION: 86 year old with history of urothelial cancer and diffuse large B-cell lymphoma. Patient has multiple liver lesions and concern for metastatic urothelial disease. Patient needs a tissue diagnosis. EXAM: ULTRASOUND-GUIDED CORE BIOPSY OF LIVER LESION MEDICATIONS: None. ANESTHESIA/SEDATION: Moderate (conscious) sedation was employed during this procedure. A total of Versed 1.0 mg and Fentanyl 50 mcg was administered intravenously. Moderate Sedation Time: 16 minutes. The patient's level of consciousness and vital signs were monitored continuously by radiology nursing throughout the procedure under my direct supervision. FLUOROSCOPY TIME:  None COMPLICATIONS: None immediate. PROCEDURE: Informed written consent was obtained from the patient after a thorough discussion of the procedural risks, benefits and alternatives. All questions were addressed. A timeout was performed prior to the initiation of the  procedure. Liver was evaluated with ultrasound. Multiple hepatic lesions were identified. Right side of the abdomen was prepped with chlorhexidine and sterile field was created. Skin and soft tissues were anesthetized with 1% lidocaine. Using ultrasound guidance, 17 gauge coaxial needle was directed into large right hepatic lesion. Needle position confirmed within the lesion. Total of 4 core biopsies were obtained with an 18 gauge core device. Specimens placed in formalin. 17 gauge coaxial needle was removed without complication. Bandage placed over the puncture site. FINDINGS: Numerous isoechoic and slightly hyperechoic lesions throughout the liver. Slightly hyperechoic lesion in the right hepatic lobe was targeted. Needle position confirmed within the lesion. Four core biopsies were obtained. No significant bleeding or hematoma formation following the core biopsies. IMPRESSION: Ultrasound-guided core biopsy of a right hepatic lesion. Electronically Signed   By: Markus Daft M.D.   On: 04/27/2019 14:38   04/21/17 CT Abdomen  ASSESSMENT & PLAN:   86 y.o. male with  1. Diffuse Large B Cell Lymphoma - Stage II -bulky  05/26/17 PET/CT which showed 1. 5 cm right-sided retroperitoneal nodal mass is hypermetabolic and consistent with known lymphoma. Small lymph node just below  the aortic bifurcation is also hypermetabolic. -ECHO shows normal EF.  S/p 4 cycles of R-mini CHOP  07/28/17 PET/CT which revealed Significant response to interval therapy. The large retroperitoneal mass is significantly smaller with minimal residual activity, slightly greater than blood pool ([place] 3). No evidence of disease progression.  12/26/17 PET/CT revealed No evidence of lymphoma recurrence on FDG PET scan. 2. Continued decrease in size and metabolic activity retroperitoneal periaortic mass. ([place] 2) 3. LEFT nephrectomy.  S/p 30.6 Gy in 17 fractions completed on 03/19/18 with Dr. Isidore Moos  2. Urothelial Cell  Carcinoma -04/23/17 Biopsy of the Left Ureter Mass revealed a transitional cell carcinoma of the ureter.  -s/p left nephroureterectomy pm 09/24/2017 -continue urologic f/u with Dr Tresa Moore.  3. S/p post operative Acute UTI with E.coli with sepsis and dehydration 4.s/p  AKI on CKD- single kidney. - likely due to poor po intake- creatinine improved with IVF 5. Protein calorie malnutrition-moderate 6. 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 S/p TURB tumor on 12/12/17 with Dr. Tresa Moore in urology  PLAN: -Discussed pt labwork today, 05/18/19; of CBC w/diff and CMP is as follows: all values are WNL except for WBC at 14.9K, RBC at 3.50, Hemoglobin at 10.7, HCT at 33.3, Neutro Abs at 11.7K, Monocytes Abs at 1.3K, Eosinophils Abs at 0.6K, Abs Immature Granulocytes at 0.11K, CO2 at 17, Glucose at 115, BUN at 44, Creatinine at 2.16, Albumin at 3.1, AST at 47, GFR, Est Non Af Am at 27, GFR, Est AFR Am at 31. -Advised on appetite- blending ensure with dried fruit or ice cream, small snacks continuously, high calorie foods  -Advised it is too soon to tell if treatment is effective -3-4 treatments until seeing if it is effective  -Advised on depression -Will increase 50mg  Zoloft -Advised on fatigue -if it is from the tumor  -Offered referral to nutritionist - pt declined  -Recommends getting fresh air  -Recommends staying active walk 20-30 minutes daily -Recommends home PT- Pt declined  -Will increase Marinol 10 mg twice a day   FOLLOW UP: Please schedule C2 of Atezolizumab as ordered on 06/01/2019 with labs and MD visit    The total time spent in the appt was 30 minutes and more than 50% was on counseling and direct patient cares.  All of the patient's questions were answered with apparent satisfaction. The patient knows to  call the clinic with any problems, questions or concerns.   Sullivan Lone MD MS AAHIVMS [facility] Hematology/Oncology Physician [facility]  (Office):       [phone] (Work cell):  [phone] (Fax):           [phone]  05/18/2019 10:32 AM  I, Dawayne Cirri am acting as a scribe for Dr. Sullivan Lone.   .I have reviewed the above documentation for accuracy and completeness, and I agree with the above. Brunetta Genera MD

## 2019-05-18 ENCOUNTER — Other Ambulatory Visit: Payer: Self-pay

## 2019-05-18 ENCOUNTER — Inpatient Hospital Stay (HOSPITAL_BASED_OUTPATIENT_CLINIC_OR_DEPARTMENT_OTHER): Payer: Medicare Other | Admitting: Hematology

## 2019-05-18 ENCOUNTER — Inpatient Hospital Stay: Payer: Medicare Other

## 2019-05-18 ENCOUNTER — Telehealth: Payer: Self-pay

## 2019-05-18 DIAGNOSIS — C689 Malignant neoplasm of urinary organ, unspecified: Secondary | ICD-10-CM | POA: Diagnosis not present

## 2019-05-18 DIAGNOSIS — Z7189 Other specified counseling: Secondary | ICD-10-CM

## 2019-05-18 DIAGNOSIS — Z5112 Encounter for antineoplastic immunotherapy: Secondary | ICD-10-CM | POA: Diagnosis not present

## 2019-05-18 DIAGNOSIS — C8333 Diffuse large B-cell lymphoma, intra-abdominal lymph nodes: Secondary | ICD-10-CM

## 2019-05-18 LAB — CBC WITH DIFFERENTIAL (CANCER CENTER ONLY)
Abs Immature Granulocytes: 0.11 10*3/uL — ABNORMAL HIGH (ref 0.00–0.07)
Basophils Absolute: 0.1 10*3/uL (ref 0.0–0.1)
Basophils Relative: 0 %
Eosinophils Absolute: 0.6 10*3/uL — ABNORMAL HIGH (ref 0.0–0.5)
Eosinophils Relative: 4 %
HCT: 33.3 % — ABNORMAL LOW (ref 39.0–52.0)
Hemoglobin: 10.7 g/dL — ABNORMAL LOW (ref 13.0–17.0)
Immature Granulocytes: 1 %
Lymphocytes Relative: 8 %
Lymphs Abs: 1.2 10*3/uL (ref 0.7–4.0)
MCH: 30.6 pg (ref 26.0–34.0)
MCHC: 32.1 g/dL (ref 30.0–36.0)
MCV: 95.1 fL (ref 80.0–100.0)
Monocytes Absolute: 1.3 10*3/uL — ABNORMAL HIGH (ref 0.1–1.0)
Monocytes Relative: 9 %
Neutro Abs: 11.7 10*3/uL — ABNORMAL HIGH (ref 1.7–7.7)
Neutrophils Relative %: 78 %
Platelet Count: 388 10*3/uL (ref 150–400)
RBC: 3.5 MIL/uL — ABNORMAL LOW (ref 4.22–5.81)
RDW: 14.2 % (ref 11.5–15.5)
WBC Count: 14.9 10*3/uL — ABNORMAL HIGH (ref 4.0–10.5)
nRBC: 0 % (ref 0.0–0.2)

## 2019-05-18 LAB — CMP (CANCER CENTER ONLY)
ALT: 36 U/L (ref 0–44)
AST: 47 U/L — ABNORMAL HIGH (ref 15–41)
Albumin: 3.1 g/dL — ABNORMAL LOW (ref 3.5–5.0)
Alkaline Phosphatase: 120 U/L (ref 38–126)
Anion gap: 14 (ref 5–15)
BUN: 44 mg/dL — ABNORMAL HIGH (ref 8–23)
CO2: 17 mmol/L — ABNORMAL LOW (ref 22–32)
Calcium: 10 mg/dL (ref 8.9–10.3)
Chloride: 108 mmol/L (ref 98–111)
Creatinine: 2.16 mg/dL — ABNORMAL HIGH (ref 0.61–1.24)
GFR, Est AFR Am: 31 mL/min — ABNORMAL LOW (ref >60)
GFR, Estimated: 27 mL/min — ABNORMAL LOW (ref >60)
Glucose, Bld: 115 mg/dL — ABNORMAL HIGH (ref 70–99)
Potassium: 5.1 mmol/L (ref 3.5–5.1)
Sodium: 139 mmol/L (ref 135–145)
Total Bilirubin: 0.6 mg/dL (ref 0.3–1.2)
Total Protein: 7.9 g/dL (ref 6.5–8.1)

## 2019-05-18 MED ORDER — ONDANSETRON HCL 8 MG PO TABS: 8.0000 mg | ORAL_TABLET | Freq: Two times a day (BID) | ORAL | 1 refills | Status: AC | PRN

## 2019-05-18 MED ORDER — DRONABINOL 10 MG PO CAPS: 10.0000 mg | ORAL_CAPSULE | Freq: Two times a day (BID) | ORAL | 0 refills | Status: DC

## 2019-05-18 MED ORDER — SERTRALINE HCL 50 MG PO TABS: 50.0000 mg | ORAL_TABLET | Freq: Every morning | ORAL | 1 refills | Status: AC

## 2019-05-18 MED ORDER — DRONABINOL 10 MG PO CAPS: 10.0000 mg | ORAL_CAPSULE | Freq: Two times a day (BID) | ORAL | 0 refills | Status: AC

## 2019-05-18 MED FILL — DRONABINOL 10 MG CAP: 10 | 30 days supply | Qty: 60 | Fill #0

## 2019-05-18 NOTE — Telephone Encounter (Signed)
Dronabinol 10 mg capsule order resent to [facility] for refill.

## 2019-05-21 ENCOUNTER — Telehealth: Payer: Self-pay | Admitting: Hematology

## 2019-05-21 NOTE — Telephone Encounter (Signed)
Scheduled per 03/23 los, called and spoke with patient's wife regarding upcoming appointments.

## 2019-05-25 ENCOUNTER — Telehealth: Payer: Self-pay | Admitting: *Deleted

## 2019-05-25 NOTE — Telephone Encounter (Signed)
Ms. Balfanz called. Following increase in Marinol and Sertraline doses last week (3/23), patient became "foggy" for 2 days. Wife stopped both medications on 3/26. Patient has been on Sertraline for several years, but has not complained of any side effects from stopping. Patient still unable to consume solids - primarily consumes cold liquids/milkshakes. He tried soup and vomited. She asked what to do about medication. She said he tolerated both meds at lower dose, so advised they could be restarted at original dose if patient wants to do that.  She is concerned that his continued weight loss will impact his ability to receive treatment. She also states he is experiencing thick mucus in the back of his throat, intermittantly. Advised her that he needs to remain hydrated even though he is not eatling. He has appointment to see Dr. Irene Limbo next week. Dr. Irene Limbo informed.

## 2019-05-27 NOTE — Progress Notes (Signed)

## 2019-06-01 ENCOUNTER — Ambulatory Visit: Payer: Medicare Other

## 2019-06-01 ENCOUNTER — Other Ambulatory Visit: Payer: Self-pay | Admitting: *Deleted

## 2019-06-01 ENCOUNTER — Other Ambulatory Visit: Payer: Medicare Other

## 2019-06-01 ENCOUNTER — Ambulatory Visit: Payer: Medicare Other | Admitting: Hematology and Oncology

## 2019-06-01 DIAGNOSIS — C689 Malignant neoplasm of urinary organ, unspecified: Secondary | ICD-10-CM

## 2019-06-01 DIAGNOSIS — C8333 Diffuse large B-cell lymphoma, intra-abdominal lymph nodes: Secondary | ICD-10-CM

## 2019-06-01 DIAGNOSIS — Z7189 Other specified counseling: Secondary | ICD-10-CM

## 2019-06-01 NOTE — Progress Notes (Signed)
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 male who has been referred to [place] by urologist Dr Alexis Frock for evaluation and management of newly diagnosed diffuse large B Cell Lymphoma. He is accompanied today by his wife. The pt reports that he is doing well overall.   The pt reports that in 1993 he had colon cancer with two tumors in the ascending and descending colon, which was picked up from a routine physical. He notes that he was treated with chemotherapy with Levamisole and Leukovorin for 6 months. He has not had any issues with the colon cancer since. He continue to see Dr. Cristina Gong for his GI.   He notes that he has had blood in the urine for a year and a half, picked up in a physical. This prompted his TURP surgery in July 2018. He notes that the bleeding persisted after the surgery. He notes that a stent was placed which has significantly reduced his hematuria. In February 2019 his left uretral mass was identified and subsequently biopsied confirming adenocarcinoma. The pt notes that Dr. Tresa Moore has up to this point been considering removing one of his kidneys, which would have a bearing on our treatment.   A 04/21/17 CT Abdomen identified a right retroperitoneal mass which was biopsied on 05/08/17 which revealed Large B-Cell Lymphoma.   He notes no ongoing health problems and is without physical limitations. He denies having had any blood transfusions.   On 04/23/17 the pt had a Uretral biopsy revealing Ureter, biopsy, Left mass- SUPERFICIAL FRAGMENTS OF UROTHELIAL CARCINOMA ASSOCIATED WITH GRANULATION TISSUE AND NECROSIS.   CT Abdomen of 04/21/17 revealed large right para-aortic retroperitoneal nodal  conglomeration in the abdomen, just inferior to the right renal artery. Imaging features compatible with metastatic disease in this patient with a history of prostate and left urothelial cancer. 2. Aortic atherosclerosis.   Soft Tissue Needle/core biopsy completed on 05/08/17 with results revealing Large B-Cell Lymphoma.   Most recent lab results (05/08/17) of CBC  is as follows: all values are WNL except for WBC at 11.3k, Monocytes Abs at 1.1k, CO2 at 19, Glucose at 109, Creatinine at 1.54, ALT at 16.  On review of systems, pt reports infrequent hematuria, mild back pain associated with golfing, and denies leg swelling, flank pain, fevers, chills, night sweats and any other symptoms.   On PMHx the pt reports Colon cancer in 1993, hypercholesterolemia, glaucoma, low risk prostate cancer, mastoiditis, hernia repair, carpel tunnel. He denies heart or lung problems.  On Social Hx the pt denies smoking and ETOH consumption. On Family Hx the pt reports that his mother had breast cancer when she was roughly 8, and also cervical cancer. On his mother's side, his aunts and uncles had several cancers including lung. Maternal grandfather had prostate cancer.   Interval History:  Jonathan Marshall returns today for management and evaluation of his Diffuse Large B-Cell Lymphoma and Urothelial carcinoma. We are joined today by his wife, Mrs. Irwin. He is here for C2D1 of Atezolizumab. [name] was on 05/18/2019. The pt reports that he is doing well overall.  The pt reports that he has a sore throat and food is not appetizing to him  at this time. He has been trying to drink Ensure and eat soups and jello as much as possible. Pt is also experiencing significant fatigue that is burdensome. He discontinued taking Marinol because it was making him feel loopy and was not improving his appetite enough. Mrs. Volker notes that the pt is coughing up a significant amount of phlegm.   Lab results today  (06/02/19) of CBC w/diff and CMP is as follows: all values are WNL except for WBC at 10.8K, RBC at 3.22, Hgb at 9.6, HCT at 30.2, Neutro Abs at 8.1K, Mono Abs at 1.3K, Sodium at 132, CO2 at 16, Glucose at 122, BUN at 71, Creatinine at 2.79, Albumin at 2.8, ALP at 128, GFR Est Non Af Am at 23. 06/02/2019 TSH at 0.556  On review of systems, pt reports fatigue, weakness, sore throat, low appetite, productive cough, depressed mood and denies constipation, abdominal pain and any other symptoms.   MEDICAL HISTORY:  Past Medical History:  Diagnosis Date  . BPH (benign prostatic hyperplasia)   . CKD (chronic kidney disease), stage III   . Colon cancer ([place])    dx'd 1993  . DDD (degenerative disc disease), lumbar    L4-L5  . Depression   . Diffuse large B cell lymphoma [facility]) oncologist-- dr Irene Limbo   dx 03/ 2019---  bx right retroperitoneal mass, Stage II,  completed radiation 03-19-2018  . Early stage glaucoma    both eyes  . History of adenomatous polyp of colon   . History of cancer chemotherapy    completed 08-27-2017  . History of colon cancer followed by GI-- dr Cristina Gong   dx 1993  s/p  hemicolectomy (malignant tumor x2)  and chemo therapy completed 1993--- (10-20-2018 no recurrence per pt)  . History of radiation therapy 02/24/18- 03/19/18   Abdomen, 1.8 Gy in 17 fractions for a total dose of 30.6 Gy.   [name] Kitchen Hyperlipidemia   . Recurrent malignant neoplasm of bladder [facility]) urologist-- dr Tresa Moore   first dx 10/ 2019  s/p  TURBT  . Right-sided sensorineural hearing loss   . Solitary right kidney    acquired--- s/p  left nephroureterectomy 09-24-2017  . Urothelial carcinoma of left distal ureter [facility]) oncologist-- dr Irene Limbo   dx 02/ 2019  --   chemo completed 07/ 2019;  s/p  left nephroureterectomy 09-24-2017,      SURGICAL HISTORY: Past Surgical History:  Procedure Laterality Date  . APPENDECTOMY    . CARPAL TUNNEL RELEASE Left 08/13/2013   Procedure: CARPAL TUNNEL RELEASE LEFT WRIST;   Surgeon: Yvette Rack., MD;  Location: [place];  Service: Orthopedics;  Laterality: Left;  . CATARACT EXTRACTION W/ INTRAOCULAR LENS  IMPLANT, BILATERAL  07/2018  . CYSTOSCOPY W/ RETROGRADES Bilateral 04/03/2017   Procedure: CYSTOSCOPY WITH RETROGRADE PYELOGRAM, LEFT URETEROSCOPY, LEFT STENT PLACEMENT;  Surgeon: Franchot Gallo, MD;  Location[facility];  Service: Urology;  Laterality: Bilateral;  . CYSTOSCOPY W/ RETROGRADES Bilateral 12/12/2017   Procedure: CYSTOSCOPY WITH RETROGRADE PYELOGRAM;  Surgeon: Alexis Frock, MD;  Location: [facility];  Service: Urology;  Laterality: Bilateral;  . CYSTOSCOPY W/ RETROGRADES Right 10/21/2018   Procedure: CYSTOSCOPY WITH RETROGRADE PYELOGRAM;  Surgeon: Alexis Frock, MD;  Location: [facility];  Service: Urology;  Laterality: Right;  . CYSTOSCOPY W/ URETERAL STENT PLACEMENT Left 04/23/2017   Procedure: CYSTOSCOPY WITH STENT REPLACEMENT;  Surgeon: Alexis Frock, MD;  Location: [facility];  Service: Urology;  Laterality: Left;  .  CYSTOSCOPY WITH FULGERATION Left 04/03/2017   Procedure: LEFT URETERAL BIOPSY;  Surgeon: Franchot Gallo, MD;  Location: [facility];  Service: Urology;  Laterality: Left;  . CYSTOSCOPY/RETROGRADE/URETEROSCOPY Left 04/23/2017   Procedure: CYSTOSCOPY/RETROGRADE/URETEROSCOPY, LEFT  URETEROSCOPY WITY  BIOPSY;  Surgeon: Alexis Frock, MD;  Location: [facility];  Service: Urology;  Laterality: Left;  . HEMICOLECTOMY  1993   malignant tumor x2 , colon cancer and Appendectomy  . INGUINAL HERNIA REPAIR Right 2003  . IR FLUORO GUIDE PORT INSERTION RIGHT  05/30/2017  . IR REMOVAL TUN ACCESS W/ PORT W/O FL MOD SED  07/06/2018  . IR US GUIDE VASC ACCESS RIGHT  05/30/2017  . MASTOIDECTOMY Right 2015  . ROBOT ASSITED LAPAROSCOPIC NEPHROURETERECTOMY Left 09/24/2017   Procedure: XI ROBOT ASSITED LAPAROSCOPIC NEPHROURETERECTOMY;   Surgeon: Alexis Frock, MD;  Location: [facility];  Service: Urology;  Laterality: Left;  . ROBOTIC ASSISTED LAPAROSCOPIC LYSIS OF ADHESION  09/24/2017   Procedure: XI ROBOTIC ASSISTED LAPAROSCOPIC EXTENSIVE LYSIS OF ADHESION;  Surgeon: Alexis Frock, MD;  Location: [facility];  Service: Urology;;  . Lonell Face  last one 06/ 2018  . TRANSURETHRAL RESECTION OF BLADDER TUMOR N/A 12/12/2017   Procedure: TRANSURETHRAL RESECTION OF BLADDER TUMOR (TURBT);  Surgeon: Alexis Frock, MD;  Location: [facility];  Service: Urology;  Laterality: N/A;  . TRANSURETHRAL RESECTION OF BLADDER TUMOR N/A 10/21/2018   Procedure: TRANSURETHRAL RESECTION OF BLADDER TUMOR (TURBT);  Surgeon: Alexis Frock, MD;  Location: [facility];  Service: Urology;  Laterality: N/A;  . TRANSURETHRAL RESECTION OF PROSTATE N/A 09/02/2016   Procedure: TRANSURETHRAL RESECTION OF THE PROSTATE (TURP);  Surgeon: Franchot Gallo, MD;  Location: [facility];  Service: Urology;  Laterality: N/A;    SOCIAL HISTORY: Social History   Socioeconomic History  . Marital status: Married    Spouse name: Not on file  . Number of children: Not on file  . Years of education: Not on file  . Highest education level: Not on file  Occupational History  . Not on file  Tobacco Use  . Smoking status: Never Smoker  . Smokeless tobacco: Never Used  Substance and Sexual Activity  . Alcohol use: Yes    Alcohol/week: 1.0 standard drinks    Types: 1 Glasses of wine per week    Comment: rare  . Drug use: Never  . Sexual activity: Not Currently  Other Topics Concern  . Not on file  Social History Narrative  . Not on file   Social Determinants of Health   Financial Resource Strain:   . Difficulty of Paying Living Expenses:   Food Insecurity:   . Worried About Charity fundraiser in the Last Year:   . Arboriculturist in the Last Year:   Transportation Needs:   . Film/video editor (Medical):   [name] Kitchen  Lack of Transportation (Non-Medical):   Physical Activity:   . Days of Exercise per Week:   . Minutes of Exercise per Session:   Stress:   . Feeling of Stress :   Social Connections:   . Frequency of Communication with Friends and Family:   . Frequency of Social Gatherings with Friends and Family:   . Attends Religious Services:   . Active Member of Clubs or Organizations:   . Attends Archivist Meetings:   [name] Kitchen Marital Status:   Intimate Partner Violence:   . Fear of Current or Ex-Partner:   . Emotionally Abused:   .  Physically Abused:   . Sexually Abused:     FAMILY HISTORY: No family history on file.  ALLERGIES:  has No Known Allergies.  MEDICATIONS:  Current Outpatient Medications  Medication Sig Dispense Refill  . dronabinol (MARINOL) 10 MG capsule Take 1 capsule (10 mg total) by mouth 2 (two) times daily before a meal. 60 capsule 0  . fluticasone (FLONASE) 50 MCG/ACT nasal spray Place 1 spray into both nostrils as needed.     . latanoprost (XALATAN) 0.005 % ophthalmic solution Place 1 drop into both eyes at bedtime.    [name] Kitchen LORazepam (ATIVAN) 0.5 MG tablet Take 1 tablet (0.5 mg total) by mouth every 6 (six) hours as needed (Nausea or vomiting). 30 tablet 0  . lovastatin (MEVACOR) 40 MG tablet Take 40 mg by mouth every evening.    . nystatin (MYCOSTATIN) 100000 UNIT/ML suspension Take 5 mLs (500,000 Units total) by mouth 4 (four) times daily for 10 days. 200 mL 0  . ondansetron (ZOFRAN) 8 MG tablet Take 1 tablet (8 mg total) by mouth 2 (two) times daily as needed (Nausea or vomiting). 30 tablet 1  . oxandrolone (OXANDRIN) 2.5 MG tablet Take 1 tablet (2.5 mg total) by mouth 2 (two) times daily. 60 tablet 0  . oxybutynin (DITROPAN-XL) 5 MG 24 hr tablet Take 5 mg by mouth at bedtime.    . prochlorperazine (COMPAZINE) 10 MG tablet Take 1 tablet (10 mg total) by mouth every 6 (six) hours as needed (Nausea or vomiting). 30 tablet 1  . sertraline (ZOLOFT) 50 MG tablet Take 1  tablet (50 mg total) by mouth every morning. 30 tablet 1  . timolol (TIMOPTIC) 0.5 % ophthalmic solution Place 1 drop into both eyes daily.     . traMADol (ULTRAM) 50 MG tablet Take 1 tablet (50 mg total) by mouth every 6 (six) hours as needed for moderate pain or severe pain. Post-operatively 10 tablet 0   Current Facility-Administered Medications  Medication Dose Route Frequency Provider Last Rate Last Admin  . 0.9 %  sodium chloride infusion   Intravenous Continuous Brunetta Genera, MD   Stopped at 06/02/19 1650    REVIEW OF SYSTEMS:   A 10+ POINT REVIEW OF SYSTEMS WAS OBTAINED including neurology, dermatology, psychiatry, cardiac, respiratory, lymph, extremities, GI, GU, Musculoskeletal, constitutional, breasts, reproductive, HEENT.  All pertinent positives are noted in the HPI.  All others are negative.   PHYSICAL EXAMINATION: ECOG PERFORMANCE STATUS: 1 - Symptomatic but completely ambulatory  Vitals:   06/02/19 1425  BP: 91/64  Pulse: 72  Resp: 18  Temp: 98.7 F (37.1 C)  SpO2: 97%   Filed Weights   06/02/19 1425  Weight: 144 lb 1.6 oz (65.4 kg)   .Body mass index is 21.28 kg/m.   Exam was given in a chair   GENERAL:alert, in no acute distress and comfortable SKIN: no acute rashes, no significant lesions EYES: conjunctiva are pink and non-injected, sclera anicteric OROPHARYNX: MMM, no exudates, no oropharyngeal erythema or ulceration NECK: supple, no JVD LYMPH:  no palpable lymphadenopathy in the cervical, axillary or inguinal regions LUNGS: clear to auscultation b/l with normal respiratory effort HEART: regular rate & rhythm ABDOMEN:  normoactive bowel sounds , non tender, not distended. No palpable hepatosplenomegaly.  Extremity: no pedal edema PSYCH: alert & oriented x 3 with fluent speech NEURO: no focal motor/sensory deficits  LABORATORY DATA:  I have reviewed the data as listed  . CBC Latest Ref Rng & Units 06/02/2019 05/18/2019 05/10/2019  WBC  4.0 -  10.5 K/uL 10.8(H) 14.9(H) 12.6(H)  Hemoglobin 13.0 - 17.0 g/dL 9.6(L) 10.7(L) 10.1(L)  Hematocrit 39.0 - 52.0 % 30.2(L) 33.3(L) 31.5(L)  Platelets 150 - 400 K/uL 250 388 303    . CMP Latest Ref Rng & Units 06/02/2019 05/18/2019 05/10/2019  Glucose 70 - 99 mg/dL 122(H) 115(H) 141(H)  BUN 8 - 23 mg/dL 71(H) 44(H) 35(H)  Creatinine 0.61 - 1.24 mg/dL 2.79(H) 2.16(H) 1.95(H)  Sodium 135 - 145 mmol/L 132(L) 139 140  Potassium 3.5 - 5.1 mmol/L 5.0 5.1 4.7  Chloride 98 - 111 mmol/L 104 108 108  CO2 22 - 32 mmol/L 16(L) 17(L) 19(L)  Calcium 8.9 - 10.3 mg/dL 9.4 10.0 9.5  Total Protein 6.5 - 8.1 g/dL 7.2 7.9 7.0  Total Bilirubin 0.3 - 1.2 mg/dL 0.3 0.6 0.3  Alkaline Phos 38 - 126 U/L 128(H) 120 95  AST 15 - 41 U/L 25 47(H) 41  ALT 0 - 44 U/L 24 36 23    Component     Latest Ref Rng & Units 05/15/2017  Retic Ct Pct     0.8 - 1.8 % 1.6  RBC.     4.20 - 5.82 MIL/uL 4.62  Retic Count, Absolute     34.8 - 93.9 K/uL 73.9  Prothrombin Time     11.4 - 15.2 seconds 14.5  INR      1.13  APTT     24 - 36 seconds 31  HIV Screen 4th Generation wRfx     Non Reactive Non Reactive  HCV Ab     0.0 - 0.9 s/co ratio <0.1  Hep B Core Ab, Tot     Negative Negative  Hepatitis B Surface Ag     Negative Negative  LDH     125 - 245 U/L 132   . Lab Results  Component Value Date   LDH 654 (H) 04/13/2019   12/12/17 Biopsy:    05/08/17 Soft Tissue Needle Core Biopsy:   04/23/17 Ureter Biopsy:  10/21/2018 Surgical Pathology   RADIOGRAPHIC STUDIES: I have personally reviewed the radiological images as listed and agreed with the findings in the report. No results found. 04/21/17 CT Abdomen  ASSESSMENT & PLAN:   86 y.o. male with  1. Diffuse Large B Cell Lymphoma - Stage II -bulky  05/26/17 PET/CT which showed 1. 5 cm right-sided retroperitoneal nodal mass is hypermetabolic and consistent with known lymphoma. Small lymph node just below the aortic bifurcation is also hypermetabolic. -ECHO  shows normal EF.  S/p 4 cycles of R-mini CHOP  07/28/17 PET/CT which revealed Significant response to interval therapy. The large retroperitoneal mass is significantly smaller with minimal residual activity, slightly greater than blood pool ([place] 3). No evidence of disease progression.  12/26/17 PET/CT revealed No evidence of lymphoma recurrence on FDG PET scan. 2. Continued decrease in size and metabolic activity retroperitoneal periaortic mass. (De[place]ville 2) 3. LEFT nephrectomy.  S/p 30.6 Gy in 17 fractions completed on 03/19/18 with Dr. Isidore Moos  2. 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  -s/p left nephroureterectomy pm 09/24/2017 -continue urologic f/u with Dr Tresa Moore.  3. S/p post operative Acute UTI with E.coli with sepsis and dehydration 4.s/p  AKI on CKD- single kidney. - likely due to poor po intake- creatinine improved with IVF 5. Protein calorie malnutrition-moderate 6. Diarrhea - neg GI panel -- resolved 7. High grade Papillary Urothelial Carcinoma 12/12/17 Surgical pathology of bladder tumor  revealed non invasive high grade papillary urothelial carcinoma, not involving the detrusor muscle. S/p TURB tumor on 12/12/17 with Dr. Tresa Moore in urology  PLAN: -Discussed pt labwork today, 06/02/19; some anemia, Sodium and Albumin is low, liver enzymes are okay - pt is dehydrated -Discussed 06/02/2019 TSH is WNL -Pt is having significant issues with appetite and sustaining himself nutritionally. Down 6 lbs from 2 weeks ago. -Advised pt that fatigue is multifactoral including: disease, treatment, depression, losing weight and muscle mass. -Advised pt again that we can not fully assess effectiveness until after 3-4 treatments  -Discussed options for increasing appetite including:  Remeron and Oxandrolone -Pt is uncertain about continuing treatment. He will discuss with his family and contact once a conclusion is  reached. -Will hold C2D1 of Atezolizumab today -Continue 50 mg Zoloft daily -Rx Nystatin mouthwash and Oxandrolone -Will give IV fluid today   FOLLOW UP: Depending on patients decision  The total time spent in the appt was 35 minutes and more than 50% was on counseling and direct patient cares.  All of the patient's questions were answered with apparent satisfaction. The patient knows to call the clinic with any problems, questions or concerns.   Sullivan Lone MD [place] AAHIVMS [facility] Hematology/Oncology Physician [facility]  (Office):       [phone] (Work cell):  [phone] (Fax):           [phone]  06/02/2019 5:01 PM  I, Yevette Edwards, am acting as a scribe for Dr. Sullivan Lone.   .I have reviewed the above documentation for accuracy and completeness, and I agree with the above. Brunetta Genera MD    ADDENDUM  Patient decided to discontinue further palliative immunotherapy . Will refer patient to hospice based on decision by patient and family.  Brunetta Genera MD

## 2019-06-02 ENCOUNTER — Inpatient Hospital Stay (HOSPITAL_BASED_OUTPATIENT_CLINIC_OR_DEPARTMENT_OTHER): Payer: Medicare Other | Admitting: Hematology

## 2019-06-02 ENCOUNTER — Other Ambulatory Visit: Payer: Self-pay

## 2019-06-02 ENCOUNTER — Inpatient Hospital Stay: Payer: Medicare Other | Attending: Hematology

## 2019-06-02 ENCOUNTER — Inpatient Hospital Stay: Payer: Medicare Other

## 2019-06-02 VITALS — BP 91/64 | HR 72 | Temp 98.7°F | Resp 18 | Ht 69.0 in | Wt 144.1 lb

## 2019-06-02 VITALS — BP 98/68 | HR 71 | Resp 18

## 2019-06-02 DIAGNOSIS — E860 Dehydration: Secondary | ICD-10-CM

## 2019-06-02 DIAGNOSIS — N183 Chronic kidney disease, stage 3 unspecified: Secondary | ICD-10-CM | POA: Insufficient documentation

## 2019-06-02 DIAGNOSIS — C787 Secondary malignant neoplasm of liver and intrahepatic bile duct: Secondary | ICD-10-CM | POA: Diagnosis not present

## 2019-06-02 DIAGNOSIS — Z9221 Personal history of antineoplastic chemotherapy: Secondary | ICD-10-CM | POA: Diagnosis not present

## 2019-06-02 DIAGNOSIS — C8333 Diffuse large B-cell lymphoma, intra-abdominal lymph nodes: Secondary | ICD-10-CM

## 2019-06-02 DIAGNOSIS — C689 Malignant neoplasm of urinary organ, unspecified: Secondary | ICD-10-CM

## 2019-06-02 DIAGNOSIS — C8339 Diffuse large B-cell lymphoma, extranodal and solid organ sites: Secondary | ICD-10-CM | POA: Insufficient documentation

## 2019-06-02 DIAGNOSIS — Z7189 Other specified counseling: Secondary | ICD-10-CM

## 2019-06-02 DIAGNOSIS — C662 Malignant neoplasm of left ureter: Secondary | ICD-10-CM | POA: Diagnosis not present

## 2019-06-02 DIAGNOSIS — Z79899 Other long term (current) drug therapy: Secondary | ICD-10-CM | POA: Insufficient documentation

## 2019-06-02 LAB — CBC WITH DIFFERENTIAL (CANCER CENTER ONLY)
Abs Immature Granulocytes: 0.06 10*3/uL (ref 0.00–0.07)
Basophils Absolute: 0 10*3/uL (ref 0.0–0.1)
Basophils Relative: 0 %
Eosinophils Absolute: 0.2 10*3/uL (ref 0.0–0.5)
Eosinophils Relative: 2 %
HCT: 30.2 % — ABNORMAL LOW (ref 39.0–52.0)
Hemoglobin: 9.6 g/dL — ABNORMAL LOW (ref 13.0–17.0)
Immature Granulocytes: 1 %
Lymphocytes Relative: 10 %
Lymphs Abs: 1.1 10*3/uL (ref 0.7–4.0)
MCH: 29.8 pg (ref 26.0–34.0)
MCHC: 31.8 g/dL (ref 30.0–36.0)
MCV: 93.8 fL (ref 80.0–100.0)
Monocytes Absolute: 1.3 10*3/uL — ABNORMAL HIGH (ref 0.1–1.0)
Monocytes Relative: 12 %
Neutro Abs: 8.1 10*3/uL — ABNORMAL HIGH (ref 1.7–7.7)
Neutrophils Relative %: 75 %
Platelet Count: 250 10*3/uL (ref 150–400)
RBC: 3.22 MIL/uL — ABNORMAL LOW (ref 4.22–5.81)
RDW: 14.5 % (ref 11.5–15.5)
WBC Count: 10.8 10*3/uL — ABNORMAL HIGH (ref 4.0–10.5)
nRBC: 0 % (ref 0.0–0.2)

## 2019-06-02 LAB — CMP (CANCER CENTER ONLY)
ALT: 24 U/L (ref 0–44)
AST: 25 U/L (ref 15–41)
Albumin: 2.8 g/dL — ABNORMAL LOW (ref 3.5–5.0)
Alkaline Phosphatase: 128 U/L — ABNORMAL HIGH (ref 38–126)
Anion gap: 12 (ref 5–15)
BUN: 71 mg/dL — ABNORMAL HIGH (ref 8–23)
CO2: 16 mmol/L — ABNORMAL LOW (ref 22–32)
Calcium: 9.4 mg/dL (ref 8.9–10.3)
Chloride: 104 mmol/L (ref 98–111)
Creatinine: 2.79 mg/dL — ABNORMAL HIGH (ref 0.61–1.24)
GFR, Est AFR Am: 23 mL/min — ABNORMAL LOW (ref >60)
GFR, Estimated: 20 mL/min — ABNORMAL LOW (ref >60)
Glucose, Bld: 122 mg/dL — ABNORMAL HIGH (ref 70–99)
Potassium: 5 mmol/L (ref 3.5–5.1)
Sodium: 132 mmol/L — ABNORMAL LOW (ref 135–145)
Total Bilirubin: 0.3 mg/dL (ref 0.3–1.2)
Total Protein: 7.2 g/dL (ref 6.5–8.1)

## 2019-06-02 LAB — TSH: TSH: 0.556 u[IU]/mL (ref 0.320–4.118)

## 2019-06-02 MED ORDER — OXANDROLONE 2.5 MG PO TABS: 2.5000 mg | ORAL_TABLET | Freq: Two times a day (BID) | ORAL | 0 refills | Status: AC

## 2019-06-02 MED ORDER — NYSTATIN 100000 UNIT/ML MT SUSP: 5.0000 mL | Freq: Four times a day (QID) | OROMUCOSAL | 0 refills | Status: AC

## 2019-06-02 MED ORDER — SODIUM CHLORIDE 0.9 % IV SOLN
INTRAVENOUS | Status: AC
  Filled 2019-06-02 (×2): qty 250

## 2019-06-02 NOTE — Patient Instructions (Signed)

## 2019-06-07 ENCOUNTER — Telehealth: Payer: Self-pay | Admitting: *Deleted

## 2019-06-07 NOTE — Telephone Encounter (Signed)
Patient's wife called. Patient discussed treatment with wife and son this weekend. Patient and wife want to "let nature take its course" and not seek further treatment. She asked questions about hospice and asked if Dr. Irene Limbo would order hospice care. Information provided to her. Dr. Irene Limbo informed.

## 2019-06-08 NOTE — Telephone Encounter (Signed)
[place] for referral per Dr. Irene Limbo. Spoke with [employer] - provided patient name, dob and contact#. They will contact patient.

## 2019-06-09 ENCOUNTER — Inpatient Hospital Stay: Payer: Medicare Other

## 2019-06-10 ENCOUNTER — Telehealth: Payer: Self-pay | Admitting: *Deleted

## 2019-06-10 NOTE — Telephone Encounter (Signed)
Faxed physician signed orders for [facility] to [phone]. Fax confirmation received

## 2019-06-23 ENCOUNTER — Inpatient Hospital Stay: Payer: Medicare Other | Admitting: Hematology

## 2019-06-23 ENCOUNTER — Telehealth: Payer: Self-pay | Admitting: *Deleted

## 2019-06-23 NOTE — Telephone Encounter (Signed)
Faxed orders signed by Dr, Irene Limbo to [facility]. Fax confirmation received.

## 2019-07-27 DEATH — deceased

## 2020-03-04 IMAGING — US IR FLUORO GUIDE CV LINE*R*
1 series · 1 of 1 positions shown · non-contrast
Comparison: none

CLINICAL DATA: Ureteral carcinoma

[Series 1: ir fluoro guide cv line*right* · 0.07mm/px · 1 of 1 slices shown]
[im 1/1]
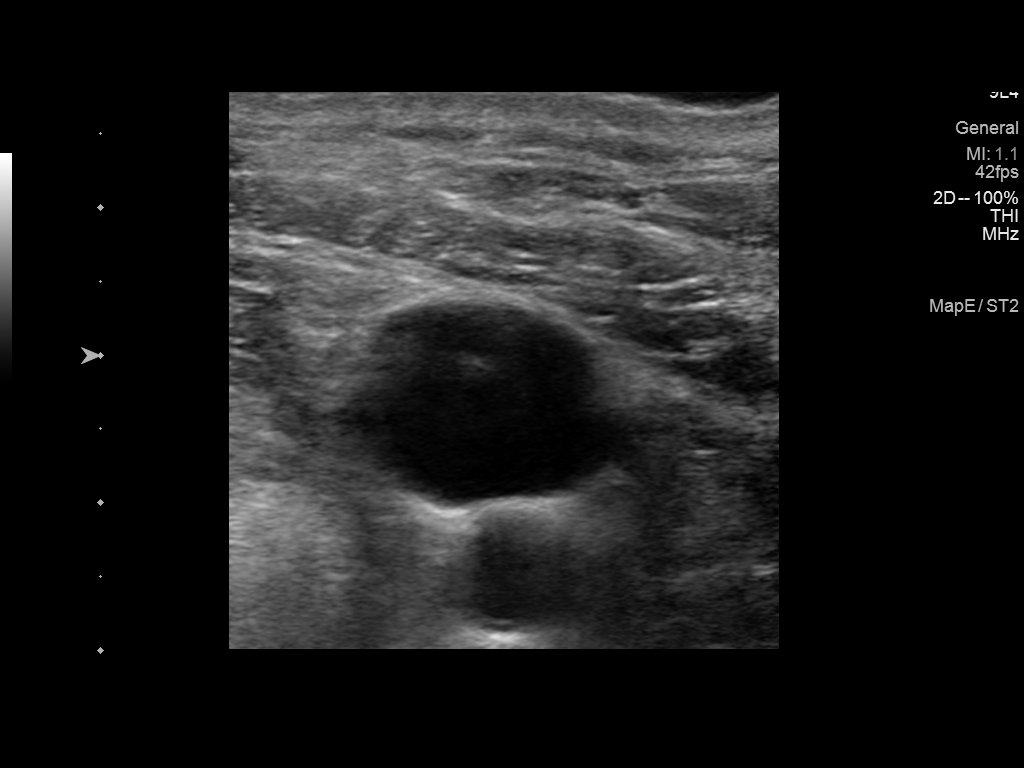

[1 of 1 positions shown; findings below may reference images not displayed]

EXAM:
TUNNEL POWER PORT PLACEMENT WITH SUBCUTANEOUS POCKET UTILIZING
ULTRASOUND & FLOUROSCOPY

FLUOROSCOPY TIME:  1 minutes period 18 mGy.

MEDICATIONS AND MEDICAL HISTORY:
Versed 2 mg, Fentanyl 100 mcg.

Additional Medications: 2 g Ancef. Antibiotics were given within 2
hours of the procedure.

ANESTHESIA/SEDATION:
Moderate sedation time: 26 minutes minutes. Nursing monitored the
the patient during the procedure.

PROCEDURE:
After written informed consent was obtained, patient was placed in
the supine position on angiographic table. The right neck and chest
was prepped and draped in a sterile fashion. Lidocaine was utilized
for local anesthesia. The right jugular vein was noted to be patent
initially with ultrasound. Under sonographic guidance, a
micropuncture needle was inserted into the right IJ vein (Ultrasound
and fluoroscopic image documentation was performed). The needle was
removed over an 018 wire which was exchanged for a Amplatz. This was
advanced into the IVC. An 8-French dilator was advanced over the
Amplatz.

A small incision was made in the right upper chest over the anterior
right second rib. Utilizing blunt dissection, a subcutaneous pocket
was created in the caudal direction. The pocket was irrigated with a
copious amount of sterile normal saline. The port catheter was
tunneled from the chest incision, and out the neck incision. The
reservoir was inserted into the subcutaneous pocket and secured with
two 3-0 Ethilon stitches. A peel-away sheath was advanced over the
Amplatz wire. The port catheter was cut to measure length and
inserted through the peel-away sheath. The peel-away sheath was
removed. The chest incision was closed with 3-0 Vicryl interrupted
stitches for the subcutaneous tissue and a running of 4-0 Vicryl
subcuticular stitch for the skin. The neck incision was closed with
a 4-0 Vicryl subcuticular stitch. Derma-bond was applied to both
surgical incisions. The port reservoir was flushed and instilled
with heparinized saline. No complications.
FINDINGS: A right IJ vein Port-A-Cath is in place with its tip at the
cavoatrial junction.

COMPLICATIONS:
None
IMPRESSION: Successful 8 French right internal jugular vein power port placement
with its tip at the SVC/RA junction.
